# Patient Record
Sex: Female | Born: 1935 | Race: Black or African American | Hispanic: No | Smoking: Never smoker
Health system: Southern US, Community
[De-identification: ages and names within clinical notes are randomized; demographics above are authoritative.]

## PROBLEM LIST (undated history)

## (undated) DIAGNOSIS — G8929 Other chronic pain: Secondary | ICD-10-CM

## (undated) DIAGNOSIS — R413 Other amnesia: Secondary | ICD-10-CM

## (undated) DIAGNOSIS — Z800 Family history of malignant neoplasm of digestive organs: Secondary | ICD-10-CM

## (undated) DIAGNOSIS — M1990 Unspecified osteoarthritis, unspecified site: Secondary | ICD-10-CM

## (undated) DIAGNOSIS — M545 Low back pain, unspecified: Secondary | ICD-10-CM

## (undated) DIAGNOSIS — K2970 Gastritis, unspecified, without bleeding: Secondary | ICD-10-CM

## (undated) DIAGNOSIS — K5730 Diverticulosis of large intestine without perforation or abscess without bleeding: Secondary | ICD-10-CM

## (undated) DIAGNOSIS — I10 Essential (primary) hypertension: Secondary | ICD-10-CM

## (undated) DIAGNOSIS — K219 Gastro-esophageal reflux disease without esophagitis: Secondary | ICD-10-CM

## (undated) DIAGNOSIS — M479 Spondylosis, unspecified: Secondary | ICD-10-CM

## (undated) DIAGNOSIS — I4891 Unspecified atrial fibrillation: Secondary | ICD-10-CM

## (undated) DIAGNOSIS — E119 Type 2 diabetes mellitus without complications: Secondary | ICD-10-CM

## (undated) DIAGNOSIS — D696 Thrombocytopenia, unspecified: Secondary | ICD-10-CM

## (undated) DIAGNOSIS — K449 Diaphragmatic hernia without obstruction or gangrene: Secondary | ICD-10-CM

## (undated) DIAGNOSIS — M542 Cervicalgia: Secondary | ICD-10-CM

## (undated) HISTORY — DX: Essential (primary) hypertension: I10

## (undated) HISTORY — DX: Low back pain, unspecified: M54.50

## (undated) HISTORY — DX: Gastritis, unspecified, without bleeding: K29.70

## (undated) HISTORY — DX: Low back pain: M54.5

## (undated) HISTORY — DX: Other chronic pain: G89.29

## (undated) HISTORY — DX: Cervicalgia: M54.2

## (undated) HISTORY — DX: Diverticulosis of large intestine without perforation or abscess without bleeding: K57.30

## (undated) HISTORY — DX: Spondylosis, unspecified: M47.9

## (undated) HISTORY — DX: Type 2 diabetes mellitus without complications: E11.9

## (undated) HISTORY — PX: TUBAL LIGATION: SHX77

## (undated) HISTORY — DX: Unspecified osteoarthritis, unspecified site: M19.90

## (undated) HISTORY — DX: Other amnesia: R41.3

## (undated) HISTORY — DX: Diaphragmatic hernia without obstruction or gangrene: K44.9

## (undated) HISTORY — DX: Family history of malignant neoplasm of digestive organs: Z80.0

---

## 2000-01-04 ENCOUNTER — Encounter: Payer: Self-pay | Admitting: Obstetrics and Gynecology

## 2000-01-04 ENCOUNTER — Encounter: Admission: RE | Admit: 2000-01-04 | Discharge: 2000-01-04 | Payer: Self-pay | Admitting: Obstetrics and Gynecology

## 2000-04-25 ENCOUNTER — Encounter (INDEPENDENT_AMBULATORY_CARE_PROVIDER_SITE_OTHER): Payer: Self-pay | Admitting: Specialist

## 2000-04-25 ENCOUNTER — Ambulatory Visit (HOSPITAL_COMMUNITY): Admission: RE | Admit: 2000-04-25 | Discharge: 2000-04-25 | Payer: Self-pay | Admitting: Obstetrics and Gynecology

## 2000-06-28 ENCOUNTER — Ambulatory Visit (HOSPITAL_COMMUNITY): Admission: RE | Admit: 2000-06-28 | Discharge: 2000-06-28 | Payer: Self-pay | Admitting: *Deleted

## 2001-01-09 ENCOUNTER — Encounter: Admission: RE | Admit: 2001-01-09 | Discharge: 2001-01-09 | Payer: Self-pay | Admitting: Obstetrics and Gynecology

## 2001-01-09 ENCOUNTER — Encounter: Payer: Self-pay | Admitting: Obstetrics and Gynecology

## 2001-07-31 ENCOUNTER — Ambulatory Visit (HOSPITAL_COMMUNITY): Admission: RE | Admit: 2001-07-31 | Discharge: 2001-07-31 | Payer: Self-pay | Admitting: Gastroenterology

## 2002-01-01 ENCOUNTER — Encounter: Payer: Self-pay | Admitting: Obstetrics and Gynecology

## 2002-01-01 ENCOUNTER — Encounter: Admission: RE | Admit: 2002-01-01 | Discharge: 2002-01-01 | Payer: Self-pay | Admitting: Obstetrics and Gynecology

## 2003-05-07 ENCOUNTER — Ambulatory Visit (HOSPITAL_COMMUNITY): Admission: RE | Admit: 2003-05-07 | Discharge: 2003-05-07 | Payer: Self-pay | Admitting: *Deleted

## 2003-07-08 ENCOUNTER — Encounter: Payer: Self-pay | Admitting: Obstetrics and Gynecology

## 2003-07-08 ENCOUNTER — Encounter: Admission: RE | Admit: 2003-07-08 | Discharge: 2003-07-08 | Payer: Self-pay | Admitting: Obstetrics and Gynecology

## 2003-11-26 ENCOUNTER — Encounter: Payer: Self-pay | Admitting: Internal Medicine

## 2004-07-27 ENCOUNTER — Encounter: Admission: RE | Admit: 2004-07-27 | Discharge: 2004-07-27 | Payer: Self-pay | Admitting: Obstetrics and Gynecology

## 2004-12-07 ENCOUNTER — Ambulatory Visit: Payer: Self-pay | Admitting: Internal Medicine

## 2004-12-14 ENCOUNTER — Ambulatory Visit: Payer: Self-pay | Admitting: Internal Medicine

## 2004-12-14 ENCOUNTER — Ambulatory Visit: Payer: Self-pay

## 2005-04-20 ENCOUNTER — Ambulatory Visit (HOSPITAL_COMMUNITY): Admission: RE | Admit: 2005-04-20 | Discharge: 2005-04-20 | Payer: Self-pay | Admitting: Internal Medicine

## 2005-04-20 ENCOUNTER — Ambulatory Visit: Payer: Self-pay | Admitting: Internal Medicine

## 2005-05-15 ENCOUNTER — Ambulatory Visit (HOSPITAL_COMMUNITY): Admission: RE | Admit: 2005-05-15 | Discharge: 2005-05-15 | Payer: Self-pay | Admitting: Internal Medicine

## 2005-05-22 ENCOUNTER — Ambulatory Visit: Payer: Self-pay | Admitting: Internal Medicine

## 2005-07-17 ENCOUNTER — Ambulatory Visit: Payer: Self-pay | Admitting: Internal Medicine

## 2005-07-26 ENCOUNTER — Ambulatory Visit: Payer: Self-pay | Admitting: Internal Medicine

## 2005-09-07 ENCOUNTER — Ambulatory Visit: Payer: Self-pay | Admitting: Internal Medicine

## 2005-09-21 ENCOUNTER — Ambulatory Visit: Payer: Self-pay | Admitting: Gastroenterology

## 2005-09-27 ENCOUNTER — Ambulatory Visit: Payer: Self-pay | Admitting: Gastroenterology

## 2005-10-04 ENCOUNTER — Encounter: Admission: RE | Admit: 2005-10-04 | Discharge: 2005-10-04 | Payer: Self-pay | Admitting: Internal Medicine

## 2005-10-19 ENCOUNTER — Ambulatory Visit: Payer: Self-pay | Admitting: Gastroenterology

## 2005-10-31 ENCOUNTER — Ambulatory Visit: Payer: Self-pay | Admitting: Gastroenterology

## 2006-01-24 ENCOUNTER — Ambulatory Visit: Payer: Self-pay | Admitting: Internal Medicine

## 2006-02-20 ENCOUNTER — Ambulatory Visit: Payer: Self-pay | Admitting: Internal Medicine

## 2006-03-07 ENCOUNTER — Ambulatory Visit: Payer: Self-pay | Admitting: Internal Medicine

## 2006-03-28 ENCOUNTER — Ambulatory Visit: Payer: Self-pay | Admitting: Internal Medicine

## 2006-05-31 ENCOUNTER — Ambulatory Visit: Payer: Self-pay | Admitting: Internal Medicine

## 2006-06-05 ENCOUNTER — Ambulatory Visit: Payer: Self-pay | Admitting: Internal Medicine

## 2006-10-10 ENCOUNTER — Encounter: Admission: RE | Admit: 2006-10-10 | Discharge: 2006-10-10 | Payer: Self-pay | Admitting: Internal Medicine

## 2006-10-28 ENCOUNTER — Emergency Department (HOSPITAL_COMMUNITY): Admission: EM | Admit: 2006-10-28 | Discharge: 2006-10-28 | Payer: Self-pay | Admitting: Family Medicine

## 2007-01-20 LAB — CONVERTED CEMR LAB: Pap Smear: NORMAL

## 2007-01-23 ENCOUNTER — Ambulatory Visit: Payer: Self-pay | Admitting: Internal Medicine

## 2007-01-31 ENCOUNTER — Ambulatory Visit: Payer: Self-pay | Admitting: Internal Medicine

## 2007-01-31 LAB — CONVERTED CEMR LAB
ALT: 14 units/L (ref 0–40)
AST: 18 units/L (ref 0–37)
Albumin: 3.6 g/dL (ref 3.5–5.2)
Calcium: 9.7 mg/dL (ref 8.4–10.5)
Chloride: 105 meq/L (ref 96–112)
Cholesterol: 147 mg/dL (ref 0–200)
Creatinine, Ser: 0.9 mg/dL (ref 0.4–1.2)
GFR calc non Af Amer: 65 mL/min
HDL: 45.7 mg/dL (ref >39.0)
Hgb A1c MFr Bld: 6.5 % — ABNORMAL HIGH (ref 4.6–6.0)
LDL Cholesterol: 85 mg/dL (ref 0–99)
Sodium: 143 meq/L (ref 135–145)
Total Bilirubin: 0.7 mg/dL (ref 0.3–1.2)
VLDL: 16 mg/dL (ref 0–40)

## 2007-03-01 ENCOUNTER — Emergency Department (HOSPITAL_COMMUNITY): Admission: EM | Admit: 2007-03-01 | Discharge: 2007-03-01 | Payer: Self-pay | Admitting: Emergency Medicine

## 2007-05-02 ENCOUNTER — Ambulatory Visit: Payer: Self-pay | Admitting: Internal Medicine

## 2007-05-08 ENCOUNTER — Ambulatory Visit: Payer: Self-pay | Admitting: Cardiology

## 2007-05-16 ENCOUNTER — Ambulatory Visit: Payer: Self-pay | Admitting: Internal Medicine

## 2007-05-16 LAB — CONVERTED CEMR LAB
Bacteria, UA: NEGATIVE
Basophils Relative: 1 % (ref 0.0–1.0)
Bilirubin Urine: NEGATIVE
Crystals: NEGATIVE
Eosinophils Relative: 7.5 % — ABNORMAL HIGH (ref 0.0–5.0)
HCT: 39.3 % (ref 36.0–46.0)
Hemoglobin: 12.9 g/dL (ref 12.0–15.0)
Lymphocytes Relative: 39.1 % (ref 12.0–46.0)
Monocytes Absolute: 0.5 10*3/uL (ref 0.2–0.7)
Monocytes Relative: 10.5 % (ref 3.0–11.0)
Neutro Abs: 2.1 10*3/uL (ref 1.4–7.7)
Nitrite: NEGATIVE
RDW: 15 % — ABNORMAL HIGH (ref 11.5–14.6)
Sed Rate: 10 mm/hr (ref 0–25)
Specific Gravity, Urine: 1.02 (ref 1.000–1.03)
WBC, UA: NONE SEEN cells/hpf
WBC: 5 10*3/uL (ref 4.5–10.5)

## 2007-06-17 ENCOUNTER — Ambulatory Visit: Payer: Self-pay | Admitting: Internal Medicine

## 2007-06-21 ENCOUNTER — Encounter: Payer: Self-pay | Admitting: Internal Medicine

## 2007-06-21 DIAGNOSIS — I10 Essential (primary) hypertension: Secondary | ICD-10-CM | POA: Insufficient documentation

## 2007-06-21 DIAGNOSIS — M545 Low back pain, unspecified: Secondary | ICD-10-CM | POA: Insufficient documentation

## 2007-06-21 DIAGNOSIS — Z860100 Personal history of colon polyps, unspecified: Secondary | ICD-10-CM | POA: Insufficient documentation

## 2007-06-21 DIAGNOSIS — E119 Type 2 diabetes mellitus without complications: Secondary | ICD-10-CM | POA: Insufficient documentation

## 2007-07-08 ENCOUNTER — Ambulatory Visit: Payer: Self-pay | Admitting: Internal Medicine

## 2007-07-16 ENCOUNTER — Encounter: Admission: RE | Admit: 2007-07-16 | Discharge: 2007-08-08 | Payer: Self-pay | Admitting: Internal Medicine

## 2007-07-27 ENCOUNTER — Emergency Department (HOSPITAL_COMMUNITY): Admission: EM | Admit: 2007-07-27 | Discharge: 2007-07-27 | Payer: Self-pay | Admitting: Emergency Medicine

## 2007-08-19 ENCOUNTER — Ambulatory Visit: Payer: Self-pay | Admitting: Internal Medicine

## 2007-08-24 ENCOUNTER — Encounter: Admission: RE | Admit: 2007-08-24 | Discharge: 2007-08-24 | Payer: Self-pay | Admitting: Internal Medicine

## 2007-09-04 ENCOUNTER — Ambulatory Visit: Payer: Self-pay | Admitting: Internal Medicine

## 2007-10-08 ENCOUNTER — Encounter: Payer: Self-pay | Admitting: Internal Medicine

## 2007-10-20 ENCOUNTER — Encounter: Admission: RE | Admit: 2007-10-20 | Discharge: 2007-11-26 | Payer: Self-pay | Admitting: Neurosurgery

## 2007-11-14 ENCOUNTER — Telehealth: Payer: Self-pay | Admitting: Internal Medicine

## 2007-11-17 ENCOUNTER — Telehealth: Payer: Self-pay | Admitting: Internal Medicine

## 2007-12-17 ENCOUNTER — Encounter: Payer: Self-pay | Admitting: Internal Medicine

## 2008-02-05 ENCOUNTER — Ambulatory Visit (HOSPITAL_COMMUNITY): Admission: RE | Admit: 2008-02-05 | Discharge: 2008-02-05 | Payer: Self-pay | Admitting: Neurosurgery

## 2008-02-27 ENCOUNTER — Ambulatory Visit: Payer: Self-pay | Admitting: Internal Medicine

## 2008-02-27 LAB — CONVERTED CEMR LAB
ALT: 13 units/L (ref 0–35)
Basophils Absolute: 0.1 10*3/uL (ref 0.0–0.1)
Basophils Relative: 1.2 % — ABNORMAL HIGH (ref 0.0–1.0)
CO2: 33 meq/L — ABNORMAL HIGH (ref 19–32)
Calcium: 9.4 mg/dL (ref 8.4–10.5)
Cholesterol: 171 mg/dL (ref 0–200)
Creatinine, Ser: 1.2 mg/dL (ref 0.4–1.2)
Creatinine,U: 481 mg/dL
Eosinophils Absolute: 0.3 10*3/uL (ref 0.0–0.7)
GFR calc Af Amer: 56 mL/min
GFR calc non Af Amer: 46 mL/min
Hemoglobin: 12.6 g/dL (ref 12.0–15.0)
MCHC: 32 g/dL (ref 30.0–36.0)
MCV: 85.3 fL (ref 78.0–100.0)
Microalb Creat Ratio: 8.9 mg/g (ref 0.0–30.0)
Microalb, Ur: 4.3 mg/dL — ABNORMAL HIGH (ref 0.0–1.9)
Monocytes Absolute: 0.7 10*3/uL (ref 0.1–1.0)
Neutro Abs: 2.4 10*3/uL (ref 1.4–7.7)
RBC: 4.6 M/uL (ref 3.87–5.11)
TSH: 1.68 microintl units/mL (ref 0.35–5.50)
Total Bilirubin: 0.7 mg/dL (ref 0.3–1.2)
Triglycerides: 118 mg/dL (ref 0–149)

## 2008-03-03 ENCOUNTER — Telehealth: Payer: Self-pay | Admitting: Internal Medicine

## 2008-03-03 ENCOUNTER — Ambulatory Visit: Payer: Self-pay | Admitting: Internal Medicine

## 2008-03-03 DIAGNOSIS — E041 Nontoxic single thyroid nodule: Secondary | ICD-10-CM | POA: Insufficient documentation

## 2008-03-03 DIAGNOSIS — N63 Unspecified lump in unspecified breast: Secondary | ICD-10-CM | POA: Insufficient documentation

## 2008-03-12 ENCOUNTER — Encounter: Admission: RE | Admit: 2008-03-12 | Discharge: 2008-03-12 | Payer: Self-pay | Admitting: Internal Medicine

## 2008-03-15 ENCOUNTER — Telehealth: Payer: Self-pay | Admitting: Internal Medicine

## 2008-03-16 ENCOUNTER — Encounter: Admission: RE | Admit: 2008-03-16 | Discharge: 2008-03-16 | Payer: Self-pay | Admitting: Internal Medicine

## 2008-03-22 ENCOUNTER — Ambulatory Visit: Payer: Self-pay | Admitting: Internal Medicine

## 2008-03-22 DIAGNOSIS — M542 Cervicalgia: Secondary | ICD-10-CM | POA: Insufficient documentation

## 2008-03-22 DIAGNOSIS — I499 Cardiac arrhythmia, unspecified: Secondary | ICD-10-CM | POA: Insufficient documentation

## 2008-04-13 ENCOUNTER — Ambulatory Visit: Payer: Self-pay | Admitting: Cardiology

## 2008-04-15 ENCOUNTER — Telehealth: Payer: Self-pay | Admitting: Internal Medicine

## 2008-04-23 ENCOUNTER — Telehealth: Payer: Self-pay | Admitting: Internal Medicine

## 2008-04-28 ENCOUNTER — Telehealth (INDEPENDENT_AMBULATORY_CARE_PROVIDER_SITE_OTHER): Payer: Self-pay | Admitting: *Deleted

## 2008-04-28 ENCOUNTER — Ambulatory Visit: Payer: Self-pay | Admitting: Internal Medicine

## 2008-04-28 DIAGNOSIS — I281 Aneurysm of pulmonary artery: Secondary | ICD-10-CM | POA: Insufficient documentation

## 2008-05-12 ENCOUNTER — Encounter (INDEPENDENT_AMBULATORY_CARE_PROVIDER_SITE_OTHER): Payer: Self-pay | Admitting: *Deleted

## 2008-06-16 ENCOUNTER — Encounter
Admission: RE | Admit: 2008-06-16 | Discharge: 2008-07-30 | Payer: Self-pay | Admitting: Physical Medicine and Rehabilitation

## 2008-06-18 ENCOUNTER — Ambulatory Visit: Payer: Self-pay | Admitting: Physical Medicine and Rehabilitation

## 2008-06-18 ENCOUNTER — Encounter: Payer: Self-pay | Admitting: Internal Medicine

## 2008-06-30 ENCOUNTER — Ambulatory Visit: Payer: Self-pay | Admitting: Internal Medicine

## 2008-07-09 ENCOUNTER — Ambulatory Visit: Payer: Self-pay | Admitting: Internal Medicine

## 2008-07-09 DIAGNOSIS — M76899 Other specified enthesopathies of unspecified lower limb, excluding foot: Secondary | ICD-10-CM | POA: Insufficient documentation

## 2008-07-09 LAB — CONVERTED CEMR LAB
GFR calc Af Amer: 69 mL/min
GFR calc non Af Amer: 57 mL/min
Glucose, Bld: 96 mg/dL (ref 70–99)
Potassium: 3.9 meq/L (ref 3.5–5.1)
Sodium: 140 meq/L (ref 135–145)
VLDL: 15 mg/dL (ref 0–40)

## 2008-07-30 ENCOUNTER — Ambulatory Visit: Payer: Self-pay | Admitting: Physical Medicine and Rehabilitation

## 2008-08-16 ENCOUNTER — Telehealth: Payer: Self-pay | Admitting: Internal Medicine

## 2008-09-16 ENCOUNTER — Ambulatory Visit: Payer: Self-pay | Admitting: Internal Medicine

## 2008-09-16 DIAGNOSIS — K921 Melena: Secondary | ICD-10-CM | POA: Insufficient documentation

## 2008-09-17 ENCOUNTER — Telehealth: Payer: Self-pay | Admitting: Internal Medicine

## 2008-09-22 ENCOUNTER — Encounter
Admission: RE | Admit: 2008-09-22 | Discharge: 2008-09-27 | Payer: Self-pay | Admitting: Physical Medicine and Rehabilitation

## 2008-09-27 ENCOUNTER — Ambulatory Visit: Payer: Self-pay | Admitting: Physical Medicine and Rehabilitation

## 2008-10-06 ENCOUNTER — Telehealth: Payer: Self-pay | Admitting: Internal Medicine

## 2008-10-22 ENCOUNTER — Telehealth: Payer: Self-pay | Admitting: Internal Medicine

## 2008-11-26 HISTORY — PX: COLONOSCOPY: SHX174

## 2008-12-23 ENCOUNTER — Encounter
Admission: RE | Admit: 2008-12-23 | Discharge: 2009-03-23 | Payer: Self-pay | Admitting: Physical Medicine and Rehabilitation

## 2008-12-24 ENCOUNTER — Ambulatory Visit: Payer: Self-pay | Admitting: Physical Medicine and Rehabilitation

## 2009-01-29 ENCOUNTER — Encounter: Payer: Self-pay | Admitting: Internal Medicine

## 2009-01-29 ENCOUNTER — Ambulatory Visit: Payer: Self-pay | Admitting: Internal Medicine

## 2009-01-29 DIAGNOSIS — R634 Abnormal weight loss: Secondary | ICD-10-CM | POA: Insufficient documentation

## 2009-02-10 ENCOUNTER — Ambulatory Visit: Payer: Self-pay | Admitting: Physical Medicine and Rehabilitation

## 2009-03-04 ENCOUNTER — Ambulatory Visit: Payer: Self-pay | Admitting: Internal Medicine

## 2009-03-04 LAB — CONVERTED CEMR LAB
Basophils Absolute: 0 10*3/uL (ref 0.0–0.1)
Chloride: 105 meq/L (ref 96–112)
Eosinophils Absolute: 0.4 10*3/uL (ref 0.0–0.7)
Hgb A1c MFr Bld: 6.1 % (ref 4.6–6.5)
Lymphocytes Relative: 39 % (ref 12.0–46.0)
MCHC: 33.9 g/dL (ref 30.0–36.0)
Neutrophils Relative %: 45.3 % (ref 43.0–77.0)
Potassium: 3.9 meq/L (ref 3.5–5.1)
RBC: 4.31 M/uL (ref 3.87–5.11)
RDW: 14.7 % — ABNORMAL HIGH (ref 11.5–14.6)
Sodium: 143 meq/L (ref 135–145)
TSH: 1.97 microintl units/mL (ref 0.35–5.50)
Vitamin B-12: 308 pg/mL (ref 211–911)

## 2009-03-20 ENCOUNTER — Encounter: Payer: Self-pay | Admitting: Internal Medicine

## 2009-03-25 ENCOUNTER — Telehealth: Payer: Self-pay | Admitting: Internal Medicine

## 2009-03-25 ENCOUNTER — Encounter: Admission: RE | Admit: 2009-03-25 | Discharge: 2009-03-25 | Payer: Self-pay | Admitting: Internal Medicine

## 2009-04-05 ENCOUNTER — Encounter
Admission: RE | Admit: 2009-04-05 | Discharge: 2009-04-07 | Payer: Self-pay | Admitting: Physical Medicine and Rehabilitation

## 2009-04-07 ENCOUNTER — Ambulatory Visit: Payer: Self-pay | Admitting: Physical Medicine and Rehabilitation

## 2009-06-07 ENCOUNTER — Ambulatory Visit: Payer: Self-pay | Admitting: Internal Medicine

## 2009-06-07 DIAGNOSIS — K5289 Other specified noninfective gastroenteritis and colitis: Secondary | ICD-10-CM | POA: Insufficient documentation

## 2009-06-07 LAB — CONVERTED CEMR LAB
Basophils Relative: 4.2 % — ABNORMAL HIGH (ref 0.0–3.0)
CO2: 32 meq/L (ref 19–32)
Chloride: 105 meq/L (ref 96–112)
Eosinophils Absolute: 0.2 10*3/uL (ref 0.0–0.7)
Eosinophils Relative: 4.1 % (ref 0.0–5.0)
Hemoglobin: 13.6 g/dL (ref 12.0–15.0)
Lymphocytes Relative: 37.5 % (ref 12.0–46.0)
MCHC: 33.4 g/dL (ref 30.0–36.0)
MCV: 84.6 fL (ref 78.0–100.0)
Neutro Abs: 2 10*3/uL (ref 1.4–7.7)
RBC: 4.81 M/uL (ref 3.87–5.11)
Sodium: 142 meq/L (ref 135–145)

## 2009-06-08 ENCOUNTER — Telehealth: Payer: Self-pay | Admitting: Internal Medicine

## 2009-06-09 ENCOUNTER — Telehealth: Payer: Self-pay | Admitting: Internal Medicine

## 2009-06-09 ENCOUNTER — Ambulatory Visit: Payer: Self-pay | Admitting: Internal Medicine

## 2009-06-10 ENCOUNTER — Ambulatory Visit: Payer: Self-pay | Admitting: Cardiology

## 2009-06-10 ENCOUNTER — Ambulatory Visit: Payer: Self-pay | Admitting: Internal Medicine

## 2009-06-10 ENCOUNTER — Telehealth: Payer: Self-pay | Admitting: Internal Medicine

## 2009-06-10 DIAGNOSIS — R109 Unspecified abdominal pain: Secondary | ICD-10-CM | POA: Insufficient documentation

## 2009-06-10 LAB — CONVERTED CEMR LAB
Albumin: 4.2 g/dL (ref 3.5–5.2)
Amylase: 180 units/L — ABNORMAL HIGH (ref 27–131)
BUN: 13 mg/dL (ref 6–23)
Basophils Absolute: 0 10*3/uL (ref 0.0–0.1)
CO2: 31 meq/L (ref 19–32)
Chloride: 108 meq/L (ref 96–112)
Eosinophils Absolute: 0.1 10*3/uL (ref 0.0–0.7)
GFR calc non Af Amer: 68.93 mL/min (ref >60)
Glucose, Bld: 118 mg/dL — ABNORMAL HIGH (ref 70–99)
HCT: 43.5 % (ref 36.0–46.0)
Hemoglobin: 14.2 g/dL (ref 12.0–15.0)
Ketones, urine, test strip: NEGATIVE
Lipase: 33 units/L (ref 11.0–59.0)
Lymphs Abs: 1.8 10*3/uL (ref 0.7–4.0)
MCHC: 32.7 g/dL (ref 30.0–36.0)
MCV: 85.7 fL (ref 78.0–100.0)
Monocytes Absolute: 0.5 10*3/uL (ref 0.1–1.0)
Neutro Abs: 2.5 10*3/uL (ref 1.4–7.7)
Nitrite: NEGATIVE
Platelets: 193 10*3/uL (ref 150.0–400.0)
Potassium: 4.5 meq/L (ref 3.5–5.1)
RDW: 13.4 % (ref 11.5–14.6)
Total Bilirubin: 0.9 mg/dL (ref 0.3–1.2)
Urobilinogen, UA: 0.2

## 2009-06-11 ENCOUNTER — Emergency Department (HOSPITAL_COMMUNITY): Admission: EM | Admit: 2009-06-11 | Discharge: 2009-06-11 | Payer: Self-pay | Admitting: Emergency Medicine

## 2009-06-22 ENCOUNTER — Ambulatory Visit: Payer: Self-pay | Admitting: Physical Medicine and Rehabilitation

## 2009-06-22 ENCOUNTER — Encounter
Admission: RE | Admit: 2009-06-22 | Discharge: 2009-09-20 | Payer: Self-pay | Admitting: Physical Medicine and Rehabilitation

## 2009-07-04 ENCOUNTER — Telehealth: Payer: Self-pay | Admitting: Internal Medicine

## 2009-07-14 ENCOUNTER — Ambulatory Visit: Payer: Self-pay | Admitting: Internal Medicine

## 2009-07-19 ENCOUNTER — Telehealth (INDEPENDENT_AMBULATORY_CARE_PROVIDER_SITE_OTHER): Payer: Self-pay | Admitting: *Deleted

## 2009-07-20 ENCOUNTER — Emergency Department (HOSPITAL_COMMUNITY): Admission: EM | Admit: 2009-07-20 | Discharge: 2009-07-20 | Payer: Self-pay | Admitting: Emergency Medicine

## 2009-07-28 ENCOUNTER — Ambulatory Visit: Payer: Self-pay | Admitting: Internal Medicine

## 2009-07-28 DIAGNOSIS — R413 Other amnesia: Secondary | ICD-10-CM | POA: Insufficient documentation

## 2009-07-30 ENCOUNTER — Telehealth: Payer: Self-pay | Admitting: Family Medicine

## 2009-07-30 ENCOUNTER — Emergency Department (HOSPITAL_COMMUNITY): Admission: EM | Admit: 2009-07-30 | Discharge: 2009-07-30 | Payer: Self-pay | Admitting: Emergency Medicine

## 2009-08-03 ENCOUNTER — Telehealth: Payer: Self-pay | Admitting: Internal Medicine

## 2009-08-04 ENCOUNTER — Telehealth (INDEPENDENT_AMBULATORY_CARE_PROVIDER_SITE_OTHER): Payer: Self-pay | Admitting: *Deleted

## 2009-08-05 ENCOUNTER — Telehealth: Payer: Self-pay | Admitting: Internal Medicine

## 2009-08-05 ENCOUNTER — Ambulatory Visit: Payer: Self-pay | Admitting: Internal Medicine

## 2009-08-08 ENCOUNTER — Ambulatory Visit: Payer: Self-pay | Admitting: Gastroenterology

## 2009-08-08 DIAGNOSIS — K5730 Diverticulosis of large intestine without perforation or abscess without bleeding: Secondary | ICD-10-CM | POA: Insufficient documentation

## 2009-08-08 DIAGNOSIS — R110 Nausea: Secondary | ICD-10-CM | POA: Insufficient documentation

## 2009-08-08 LAB — CONVERTED CEMR LAB
Bilirubin Urine: NEGATIVE
Hemoglobin, Urine: NEGATIVE
Urine Glucose: 100 mg/dL

## 2009-08-12 ENCOUNTER — Emergency Department (HOSPITAL_COMMUNITY): Admission: EM | Admit: 2009-08-12 | Discharge: 2009-08-12 | Payer: Self-pay | Admitting: Emergency Medicine

## 2009-08-12 ENCOUNTER — Telehealth: Payer: Self-pay | Admitting: Nurse Practitioner

## 2009-08-18 ENCOUNTER — Telehealth: Payer: Self-pay | Admitting: Gastroenterology

## 2009-08-25 ENCOUNTER — Ambulatory Visit: Payer: Self-pay | Admitting: Gastroenterology

## 2009-08-25 LAB — CONVERTED CEMR LAB
AST: 16 units/L (ref 0–37)
Albumin: 4.1 g/dL (ref 3.5–5.2)
BUN: 17 mg/dL (ref 6–23)
Basophils Relative: 2.8 % (ref 0.0–3.0)
Calcium: 9.3 mg/dL (ref 8.4–10.5)
Eosinophils Relative: 4.7 % (ref 0.0–5.0)
GFR calc non Af Amer: 68.89 mL/min (ref >60)
Glucose, Bld: 104 mg/dL — ABNORMAL HIGH (ref 70–99)
HCT: 41.2 % (ref 36.0–46.0)
Hemoglobin: 13.4 g/dL (ref 12.0–15.0)
Lymphs Abs: 1.8 10*3/uL (ref 0.7–4.0)
MCV: 86.3 fL (ref 78.0–100.0)
Monocytes Absolute: 0.4 10*3/uL (ref 0.1–1.0)
Neutro Abs: 2.1 10*3/uL (ref 1.4–7.7)
Potassium: 3.6 meq/L (ref 3.5–5.1)
RBC: 4.78 M/uL (ref 3.87–5.11)
TSH: 1.68 microintl units/mL (ref 0.35–5.50)
WBC: 4.6 10*3/uL (ref 4.5–10.5)

## 2009-08-31 ENCOUNTER — Telehealth: Payer: Self-pay | Admitting: Gastroenterology

## 2009-09-09 ENCOUNTER — Telehealth: Payer: Self-pay | Admitting: Gastroenterology

## 2009-09-09 ENCOUNTER — Ambulatory Visit: Payer: Self-pay | Admitting: Gastroenterology

## 2009-09-12 ENCOUNTER — Ambulatory Visit: Payer: Self-pay | Admitting: Gastroenterology

## 2009-09-12 ENCOUNTER — Encounter: Payer: Self-pay | Admitting: Internal Medicine

## 2009-09-12 ENCOUNTER — Encounter: Payer: Self-pay | Admitting: Gastroenterology

## 2009-09-12 LAB — HM COLONOSCOPY

## 2009-09-13 ENCOUNTER — Encounter: Payer: Self-pay | Admitting: Gastroenterology

## 2009-11-17 ENCOUNTER — Telehealth: Payer: Self-pay | Admitting: Internal Medicine

## 2009-12-24 ENCOUNTER — Emergency Department (HOSPITAL_COMMUNITY): Admission: EM | Admit: 2009-12-24 | Discharge: 2009-12-24 | Payer: Self-pay | Admitting: Emergency Medicine

## 2009-12-26 ENCOUNTER — Telehealth: Payer: Self-pay | Admitting: Internal Medicine

## 2009-12-26 ENCOUNTER — Ambulatory Visit: Payer: Self-pay | Admitting: Internal Medicine

## 2009-12-28 ENCOUNTER — Encounter: Payer: Self-pay | Admitting: Internal Medicine

## 2009-12-29 ENCOUNTER — Encounter: Payer: Self-pay | Admitting: Internal Medicine

## 2009-12-29 ENCOUNTER — Ambulatory Visit: Payer: Self-pay

## 2010-01-16 ENCOUNTER — Telehealth: Payer: Self-pay | Admitting: Internal Medicine

## 2010-02-24 ENCOUNTER — Ambulatory Visit: Payer: Self-pay | Admitting: Internal Medicine

## 2010-03-02 ENCOUNTER — Ambulatory Visit: Payer: Self-pay | Admitting: Gastroenterology

## 2010-03-06 DIAGNOSIS — E538 Deficiency of other specified B group vitamins: Secondary | ICD-10-CM | POA: Insufficient documentation

## 2010-03-06 LAB — CONVERTED CEMR LAB
ALT: 12 units/L (ref 0–35)
AST: 15 units/L (ref 0–37)
Alkaline Phosphatase: 68 units/L (ref 39–117)
Amylase: 206 units/L — ABNORMAL HIGH (ref 27–131)
BUN: 12 mg/dL (ref 6–23)
Basophils Absolute: 0 10*3/uL (ref 0.0–0.1)
Basophils Relative: 0.4 % (ref 0.0–3.0)
Bilirubin, Direct: 0.1 mg/dL (ref 0.0–0.3)
Chloride: 104 meq/L (ref 96–112)
Eosinophils Absolute: 0.2 10*3/uL (ref 0.0–0.7)
Iron: 102 ug/dL (ref 42–145)
Lipase: 42 units/L (ref 11.0–59.0)
Lymphocytes Relative: 43.1 % (ref 12.0–46.0)
MCHC: 33.1 g/dL (ref 30.0–36.0)
Neutrophils Relative %: 38.3 % — ABNORMAL LOW (ref 43.0–77.0)
Potassium: 3.9 meq/L (ref 3.5–5.1)
RBC: 4.89 M/uL (ref 3.87–5.11)
Saturation Ratios: 35.9 % (ref 20.0–50.0)
Sodium: 143 meq/L (ref 135–145)
Total Bilirubin: 0.4 mg/dL (ref 0.3–1.2)
Transferrin: 203.1 mg/dL — ABNORMAL LOW (ref 212.0–360.0)

## 2010-03-07 ENCOUNTER — Ambulatory Visit: Payer: Self-pay | Admitting: Gastroenterology

## 2010-03-08 ENCOUNTER — Telehealth: Payer: Self-pay | Admitting: Internal Medicine

## 2010-03-09 ENCOUNTER — Telehealth: Payer: Self-pay | Admitting: Internal Medicine

## 2010-03-16 ENCOUNTER — Ambulatory Visit: Payer: Self-pay | Admitting: Gastroenterology

## 2010-03-23 ENCOUNTER — Ambulatory Visit: Payer: Self-pay | Admitting: Gastroenterology

## 2010-04-11 ENCOUNTER — Telehealth: Payer: Self-pay | Admitting: Gastroenterology

## 2010-04-20 ENCOUNTER — Ambulatory Visit: Payer: Self-pay | Admitting: Gastroenterology

## 2010-04-27 ENCOUNTER — Telehealth: Payer: Self-pay | Admitting: Internal Medicine

## 2010-04-27 ENCOUNTER — Encounter: Admission: RE | Admit: 2010-04-27 | Discharge: 2010-04-27 | Payer: Self-pay | Admitting: Internal Medicine

## 2010-04-27 LAB — HM MAMMOGRAPHY: HM Mammogram: NEGATIVE

## 2010-05-25 ENCOUNTER — Ambulatory Visit: Payer: Self-pay | Admitting: Gastroenterology

## 2010-06-22 ENCOUNTER — Ambulatory Visit: Payer: Self-pay | Admitting: Gastroenterology

## 2010-07-26 ENCOUNTER — Ambulatory Visit: Payer: Self-pay | Admitting: Gastroenterology

## 2010-08-24 ENCOUNTER — Ambulatory Visit: Payer: Self-pay | Admitting: Gastroenterology

## 2010-09-03 ENCOUNTER — Emergency Department (HOSPITAL_COMMUNITY): Admission: EM | Admit: 2010-09-03 | Discharge: 2010-09-03 | Payer: Self-pay | Admitting: Emergency Medicine

## 2010-09-06 ENCOUNTER — Telehealth: Payer: Self-pay | Admitting: Internal Medicine

## 2010-09-07 ENCOUNTER — Ambulatory Visit: Payer: Self-pay | Admitting: Internal Medicine

## 2010-09-07 ENCOUNTER — Encounter: Payer: Self-pay | Admitting: Internal Medicine

## 2010-09-14 ENCOUNTER — Ambulatory Visit: Payer: Self-pay | Admitting: Internal Medicine

## 2010-09-21 ENCOUNTER — Ambulatory Visit: Payer: Self-pay | Admitting: Internal Medicine

## 2010-09-23 ENCOUNTER — Encounter: Payer: Self-pay | Admitting: Internal Medicine

## 2010-12-08 ENCOUNTER — Telehealth: Payer: Self-pay | Admitting: Internal Medicine

## 2010-12-17 ENCOUNTER — Encounter: Payer: Self-pay | Admitting: Internal Medicine

## 2010-12-26 NOTE — Assessment & Plan Note (Signed)
Summary: MONTHLY B12//266.2//SP  Nurse Visit   Allergies: 1)  ! Asa  Medication Administration  Injection # 1:    Medication: Vit B12 1000 mcg    Diagnosis: VITAMIN B12 DEFICIENCY (ICD-266.2)    Route: IM    Site: L deltoid    Exp Date: 05/2012    Lot #: [id number]    Mfr: [employer]    Patient tolerated injection without complications    Given by: Milford Cage NCMA (August 24, 2010 11:16 AM)  Orders Added: 1)  Vit B12 1000 mcg [J3420]

## 2010-12-26 NOTE — Assessment & Plan Note (Signed)
Summary: #2 of 3 weely inj/266.2/ dfs  Nurse Visit   Allergies: 1)  ! Asa  Medication Administration  Injection # 1:    Medication: Vit B12 1000 mcg    Diagnosis: VITAMIN B12 DEFICIENCY (ICD-266.2)    Route: IM    Site: R deltoid    Exp Date: 12/2011    Lot #: [id number]    Mfr: [employer]    Comments: pt to schedule next weekly b12 3 of 3    Patient tolerated injection without complications    Given by: Chales Abrahams CMA Duncan Dull) (March 16, 2010 10:54 AM)  Orders Added: 1)  Vit B12 1000 mcg [J3420]

## 2010-12-26 NOTE — Assessment & Plan Note (Signed)
Summary: #1 of 3 weekly inj/266.2/dfs  Nurse Visit  Medication Administration  Injection # 1:    Medication: Vit B12 1000 mcg    Diagnosis: VITAMIN B12 DEFICIENCY (ICD-266.2)    Route: IM    Site: R deltoid    Exp Date: 12/2011    Lot #: [id number]    Mfr: [employer]    Comments: pt will return on 4/21 for next injection    Patient tolerated injection without complications    Given by: Francee Piccolo CMA Duncan Dull) (March 07, 2010 10:39 AM)  Orders Added: 1)  Vit B12 1000 mcg [J3420]

## 2010-12-26 NOTE — Assessment & Plan Note (Signed)
Summary: h/a / SD   Vital Signs:  Patient profile:   77 year old female Height:      65 inches Weight:      145 pounds BMI:     24.22 O2 Sat:      99 % on Room air Temp:     98.0 degrees F oral Pulse rate:   56 / minute BP sitting:   142 / 80  (left arm) Cuff size:   regular  Vitals Entered By: Bill Salinas CMA (December 26, 2009 3:56 PM)  O2 Flow:  Room air CC: pt was seen in the ER for severe headache with collapsing episode. Pt's daughter states after passing out it took about 10 hours for pt to come back to normal state/ ab   Primary Care Provider:  Illene Regulus, MD  CC:  pt was seen in the ER for severe headache with collapsing episode. Pt's daughter states after passing out it took about 10 hours for pt to come back to normal state/ ab.  History of Present Illness: Patient presents after two falls early saturday morning followed by 10 hours of being "out of it"  not herself. Shje also had a very bad headache.  She was seen in the ED. Reviewed records: she had negative CT head, normal cxr, normal labs. she is reported to have had right eye lid lag and right sided weakness along with inability to answer questions and was noted to be restless. the plan was to admit for TIA evaluation but then she was sent home by Dr. Conley Rolls who felt her sym;ptoms were from hyoscamine and meloxicam. She has been doing better and her symptoms have abated except for on-going headache.  She does reports that she had a slight headache last night but is now fine.   Current Medications (verified): 1)  Metformin Hcl 500 Mg  Tabs (Metformin Hcl) .... Take 1 Tablet By Mouth Two Times A Day 2)  Amlodipine Besylate 5 Mg Tabs (Amlodipine Besylate) .... Take 1 Tablet Once A Day 3)  First-Bxn Mouthwash  Susp (Diphenhyd-Lidocaine-Nystatin) .... 5 Cc Qid Swish and Swallow 4)  Alprazolam 0.5 Mg Tabs (Alprazolam) .[name].. 1 By Mouth Q6 As Needed Anxiety  Allergies (verified): 1)  ! Jonne Ply  Past History:  Past Medical  History: Last updated: 08/08/2009 Colonic polyps, hx of Diabetes mellitus, type II Hypertension Low back pain Peripheral vascular disease Chronic neck pain - spondylosis Diverticulosis Arthritis  Past Surgical History: Last updated: 06/21/2007 Tubal ligation  Family History: Last updated: 08/25/2009 Father who had a cerebrovascular accident at age 82.  A sister has a history of ovarian cancer.  One brother has problems with his liver, another with throat cancer, and one brother is deceased secondary to complications of emphysema. 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   Review of Systems  The patient denies anorexia, fever, weight loss, weight gain, vision loss, chest pain, dyspnea on exertion, peripheral edema, abdominal pain, genital sores, muscle weakness, transient blindness, depression, unusual weight change, and enlarged lymph nodes.    Physical Exam  General:  WNWD AA female cooperative with exam Head:  Normocephalic and atraumatic without obvious abnormalities. No apparent alopecia or balding. Eyes:  No corneal or conjunctival inflammation noted. EOMI. Perrla. Funduscopic exam benign, without hemorrhages, exudates or papilledema. Vision grossly normal. Ears:  R ear normal and L ear normal.   Neck:  No deformities, masses, or tenderness noted. Lungs:  Normal respiratory effort, chest expands symmetrically. Lungs are clear to auscultation, no crackles or wheezes. Heart:  Normal rate and regular rhythm. S1 and S2 normal without gallop, murmur, click, rub or other  extra sounds. Abdomen:  soft and non-tender.   Msk:  normal ROM, no joint tenderness, no joint swelling, and no joint warmth.   Pulses:  2+ radial pulse Extremities:  No clubbing, cyanosis, edema, or deformity noted with normal full range of motion of all joints.   Neurologic:  alert & oriented X3, cranial nerves II-XII intact, strength normal in all extremities, gait normal, DTRs symmetrical and normal, finger-to-nose normal, and heel-to-shin normal.   Skin:  turgor normal and color normal.   Cervical Nodes:  no anterior cervical adenopathy and no posterior cervical adenopathy.   Psych:  Oriented X3, memory intact for recent and remote, normally interactive, and good eye contact.     Impression & Recommendations:  Problem # 1:  CVA (ICD-434.91) Patient with what sounds like a limited CVA vs seizure with rapid resolution of her symptoms. ER note indicates that she was for admission to complete w/u. Per family report Dr. Conley Rolls felt her symptoms were due to Levsin, taken prn  and meloxicam. To my knowledge neither of these drugs would cause focal weakness. It was Dr. Irwin Brakeman decision to send her home. Her exam today is normal  Plan - complete w/u for CVA vs Seizure: MRI brain, EEG, Carotid dopplers           Medical treatment/ prophylaxis with ASA 325 mg daily.  Orders: Cardiology Referral (Cardiology) Neurology Referral (Neuro) Prescription Created Electronically [id number])  Complete Medication List: 1)  Metformin Hcl 500 Mg Tabs (Metformin hcl) .... Take 1 tablet by mouth two times a day 2)  Amlodipine Besylate 5 Mg Tabs (Amlodipine besylate) .... Take 1 tablet once a day 3)  First-bxn Mouthwash Susp (Diphenhyd-lidocaine-nystatin) .... 5 cc qid swish and swallow 4)  Alprazolam 0.5 Mg Tabs (Alprazolam) .[name].. 1 by mouth q6 as needed anxiety 5)  Famotidine 20 Mg Tabs (Famotidine) .[name].. 1 by mouth two times a day 6)  Lisinopril-hydrochlorothiazide 20-25 Mg Tabs (Lisinopril-hydrochlorothiazide) .[name].. 1  by mouth once daily  Other Orders: Radiology Referral (Radiology)  Patient Instructions: 1)  Possible mini-stroke vs seizure. Plan - take enteric coated aspirin 325mg  once a day. Take famotadine 20mg  twice a day to protect your stomach. Will set up a series of tests to rule out stroke or seizure: MRI brain, carotid dopplers to rule out blockages in the neck and an EEG to rule out seizure activity. Continue all your other medications as listed below.  Prescriptions: FAMOTIDINE 20 MG TABS (FAMOTIDINE) 1 by mouth two times a day  #60 x 12   Entered and Authorized by:   Jacques Navy MD   Signed by:   Jacques Navy MD on 12/27/2009   Method used:   Electronically to        [facility] [phone]* (retail)       [address]       [place], [place]  [address]       Ph: [phone]       Fax:  [phone]   RxID:   [id number]

## 2010-12-26 NOTE — Assessment & Plan Note (Signed)
Summary: BP CK/MEN /NWS      Allergies Added:  Nurse Visit   Vital Signs:  Patient profile:   77 year old female Pulse rate:   62 / minute Pulse rhythm:   regular BP sitting:   132 / 80  (left arm) Cuff size:   regular  Vitals Entered By: Lamar Sprinkles, CMA (September 21, 2010 11:14 AM) CC: BP RECHECK Comments MD Aware, continue current meds. Pt informed. Pt also needs TB for [employer] form. She will return Saturday for TB reading   Current Medications (verified): 1)  Metformin Hcl 500 Mg  Tabs (Metformin Hcl) .... Take 1 Tablet By Mouth Two Times A Day 2)  First-Bxn Mouthwash  Susp (Diphenhyd-Lidocaine-Nystatin) .... 5 Cc Qid Swish and Swallow 3)  Alprazolam 0.5 Mg Tabs (Alprazolam) .[name].. 1 By Mouth Q6 As Needed Anxiety 4)  Famotidine 20 Mg Tabs (Famotidine) .[name].. 1 By Mouth Two Times A Day 5)  Lisinopril-Hydrochlorothiazide 20-25 Mg Tabs (Lisinopril-Hydrochlorothiazide) .[name].. 1 Once Daily 6)  Levsin/sl 0.125 Mg Subl (Hyoscyamine Sulfate) .[name].. 1 Q 4-6 Hrs As Needed 7)  Cyanocobalamin 1000 Mcg/ml Inj Soln (Cyanocobalamin) .[name].. 1 Cc Im Weekly X 3 Then Monthly 8)  Amoxicillin 500 Mg Caps (Amoxicillin) .[name].. 1 Capsule Three Times A Day X 10 Days  Allergies (verified): 1)  ! Asa  Immunizations Administered:  PPD Skin Test:    Vaccine Type: PPD    Site: left forearm    Dose: 0.1 ml    Route: ID    Given by: Lamar Sprinkles, CMA    Exp. Date: 04/22/2012    Lot #: [id number]  PPD Results    Date of reading: 09/23/2010    Results: < 5mm    Interpretation: negative  Orders Added: 1)  TB Skin Test [86580] 2)  Admin 1st Vaccine [90471] 3)  Est. Patient Level I [54270] Prescriptions: LISINOPRIL-HYDROCHLOROTHIAZIDE 20-25 MG TABS (LISINOPRIL-HYDROCHLOROTHIAZIDE) 1 once daily  #90 x 2   Entered by:   Lamar Sprinkles, CMA   Authorized by:   Jacques Navy MD   Signed by:   Lamar Sprinkles, CMA on 09/21/2010   Method used:   Electronically to        [facility]  [phone]* (retail)       [address]       [place], [place]  [address]       Ph: [phone]       Fax: [phone]   RxID:   [id number]

## 2010-12-26 NOTE — Letter (Signed)
Summary: [employer]   Imported By: Lester Newark 09/27/2010 10:28:11  _____________________________________________________________________  External Attachment:    Type:   Image     Comment:   External Document

## 2010-12-26 NOTE — Progress Notes (Signed)
Summary: BP med  Phone Note Call from Patient Call back at Home Phone [phone]   Caller: Patient Summary of Call: pt called stating thst she is out of her BP meds. Pt says that MEN gave samples fo Benicar HCT last OV but no RX. No longer has cough associated with Lisinopril.  pt is requesting RX of sample med to pharmacy on file. Initial call taken by: Margaret Pyle, CMA,  March 08, 2010 2:00 PM  Follow-up for Phone Call        Rx sent for losartan/hct 50/12.5 a generic product Follow-up by: Jacques Navy MD,  March 08, 2010 2:52 PM  Additional Follow-up for Phone Call Additional follow up Details #1::        patient notified. Additional Follow-up by: Lucious Groves,  March 08, 2010 3:06 PM    New/Updated Medications: LOSARTAN POTASSIUM-HCTZ 50-12.5 MG TABS (LOSARTAN POTASSIUM-HCTZ) 1 by mouth once daily Prescriptions: LOSARTAN POTASSIUM-HCTZ 50-12.5 MG TABS (LOSARTAN POTASSIUM-HCTZ) 1 by mouth once daily  #30 x 12   Entered and Authorized by:   Jacques Navy MD   Signed by:   Jacques Navy MD on 03/08/2010   Method used:   Electronically to        [facility] [phone]* (retail)       [address]       [place], [place]  [address]       Ph: [phone]       Fax: [phone]   RxID:   [id number]

## 2010-12-26 NOTE — Assessment & Plan Note (Signed)
Summary: MONTHLY B12 SHOT.AM  Nurse Visit   Medication Administration  Injection # 1:    Medication: Vit B12 1000 mcg    Diagnosis: VITAMIN B12 DEFICIENCY (ICD-266.2)    Route: IM    Site: R deltoid    Exp Date: 04/2012    Lot #: [id number]    Mfr: [employer]    Comments: Pt will return on 08/24/10 for next injection.    Patient tolerated injection without complications    Given by: Francee Piccolo CMA Duncan Dull) (July 26, 2010 11:13 AM)  Orders Added: 1)  Vit B12 1000 mcg [J3420]

## 2010-12-26 NOTE — Progress Notes (Signed)
Summary: f/u?  Phone Note Call from Patient Call back at [facility] Phone [phone]   Summary of Call: Patient was seen by UC this weekend and told not to take her BP med b/c of decreased pulse rate. Would you like to see pt in the office for f/u after UC?  Initial call taken by: Lamar Sprinkles, CMA,  September 06, 2010 1:57 PM  Follow-up for Phone Call        needs ov tomorrow Follow-up by: Jacques Navy MD,  September 06, 2010 2:23 PM  Additional Follow-up for Phone Call Additional follow up Details #1::        Scheduled for office visit tomorrow.  Additional Follow-up by: Lamar Sprinkles, CMA,  September 06, 2010 4:39 PM

## 2010-12-26 NOTE — Assessment & Plan Note (Signed)
Summary: B12 SHOT  Nurse Visit   Allergies: 1)  ! Asa  Medication Administration  Injection # 1:    Medication: Vit B12 1000 mcg    Diagnosis: VITAMIN B12 DEFICIENCY (ICD-266.2)    Route: IM    Site: L deltoid    Exp Date: 12/2011    Lot #: [id number]    Mfr: [employer]    Comments: pt to schedule next monthly b12 at front desk     Patient tolerated injection without complications    Given by: Chales Abrahams CMA (AAMA) (May 25, 2010 11:07 AM)  Orders Added: 1)  Vit B12 1000 mcg [J3420]

## 2010-12-26 NOTE — Assessment & Plan Note (Signed)
Summary: MONTHLY B12 SHOT...LSW.  Nurse Visit   Allergies: 1)  ! Asa  Medication Administration  Injection # 1:    Medication: Vit B12 1000 mcg    Diagnosis: VITAMIN B12 DEFICIENCY (ICD-266.2)    Route: IM    Site: R deltoid    Exp Date: 04/13    Lot #: [id number]    Mfr: [employer]    Patient tolerated injection without complications    Given by: Lamona Curl CMA (AAMA) (Apr 20, 2010 12:01 PM)  Orders Added: 1)  Vit B12 1000 mcg [J3420]   Medication Administration  Injection # 1:    Medication: Vit B12 1000 mcg    Diagnosis: VITAMIN B12 DEFICIENCY (ICD-266.2)    Route: IM    Site: R deltoid    Exp Date: 04/13    Lot #: [id number]    Mfr: [employer]    Patient tolerated injection without complications    Given by: Lamona Curl CMA (AAMA) (Apr 20, 2010 12:01 PM)  Orders Added: 1)  Vit B12 1000 mcg [J3420]

## 2010-12-26 NOTE — Assessment & Plan Note (Signed)
Summary: #3 of 3 weekly inj/266.2/dfs  Nurse Visit   Allergies: 1)  ! Asa  Medication Administration  Injection # 1:    Medication: Vit B12 1000 mcg    Diagnosis: VITAMIN B12 DEFICIENCY (ICD-266.2)    Route: IM    Site: L deltoid    Exp Date: 12/12    Lot #: [id number]    Mfr: [employer]ent    Patient tolerated injection without complications    Given by: Lamona Curl CMA (AAMA) (March 23, 2010 11:07 AM)  Orders Added: 1)  Vit B12 1000 mcg [J3420]

## 2010-12-26 NOTE — Miscellaneous (Signed)
Summary: Orders Update  Clinical Lists Changes  Orders: Added new Test order of Carotid Duplex (Carotid Duplex) - Signed 

## 2010-12-26 NOTE — Progress Notes (Signed)
  Phone Note Outgoing Call   Summary of Call: carotid doppler from Feb 3 was normal - no blockage.  Tanks for calling her Initial call taken by: Jacques Navy MD,  January 16, 2010 8:49 AM  Follow-up for Phone Call        informed pt of results Follow-up by: Ami Bullins CMA,  January 16, 2010 10:49 AM

## 2010-12-26 NOTE — Assessment & Plan Note (Signed)
Summary: blood pressure/check heart-rate/cd-coming at 9am      Allergies Added:  Nurse Visit   Vital Signs:  Patient profile:   77 year old female Pulse rate:   56 / minute BP sitting:   158 / 86  (left arm) Cuff size:   regular  Vitals Entered By: Lamar Sprinkles, CMA (September 14, 2010 9:35 AM) CC: BP Check Comments MD informed, patient to continue current medications and come in to the office next week for another BP check.   Current Medications (verified): 1)  Metformin Hcl 500 Mg  Tabs (Metformin Hcl) .... Take 1 Tablet By Mouth Two Times A Day 2)  First-Bxn Mouthwash  Susp (Diphenhyd-Lidocaine-Nystatin) .... 5 Cc Qid Swish and Swallow 3)  Alprazolam 0.5 Mg Tabs (Alprazolam) .[name].. 1 By Mouth Q6 As Needed Anxiety 4)  Famotidine 20 Mg Tabs (Famotidine) .[name].. 1 By Mouth Two Times A Day 5)  Lisinopril-Hydrochlorothiazide 20-25 Mg Tabs (Lisinopril-Hydrochlorothiazide) .[name].. 1 Once Daily 6)  Levsin/sl 0.125 Mg Subl (Hyoscyamine Sulfate) .[name].. 1 Q 4-6 Hrs As Needed 7)  Cyanocobalamin 1000 Mcg/ml Inj Soln (Cyanocobalamin) .[name].. 1 Cc Im Weekly X 3 Then Monthly 8)  Amoxicillin 500 Mg Caps (Amoxicillin) .[name].. 1 Capsule Three Times A Day X 10 Days  Allergies (verified): 1)  ! Asa  Orders Added: 1)  Est. Patient Level I [24401]

## 2010-12-26 NOTE — Progress Notes (Signed)
Summary: LISINOPRIL  Phone Note Other Incoming   Caller: [facility] Summary of Call: Pt would like to stay on lisinopril/HCTZ due to cost, Please Advise Initial call taken by: Ami Bullins CMA,  March 09, 2010 10:58 AM  Follow-up for Phone Call        based on previous phone notes cough resolved with cessation of ACE-I ( lisinopril). It is better to take losartan. Is it that expensive as a generic? Follow-up by: Jacques Navy MD,  March 09, 2010 1:12 PM  Additional Follow-up for Phone Call Additional follow up Details #1::        Lisinopril was 11.50 and Losartan was 56.65. Patient would rather have a cough than pay the extra. Ok to resume lisinopril?   Pharm said there are no other options that would be cheaper.  Additional Follow-up by: Lamar Sprinkles, CMA,  March 09, 2010 6:35 PM    Additional Follow-up for Phone Call Additional follow up Details #2::    OK Follow-up by: Jacques Navy MD,  March 10, 2010 4:09 AM  New/Updated Medications: LISINOPRIL-HYDROCHLOROTHIAZIDE 20-25 MG TABS (LISINOPRIL-HYDROCHLOROTHIAZIDE) 1 once daily Prescriptions: LISINOPRIL-HYDROCHLOROTHIAZIDE 20-25 MG TABS (LISINOPRIL-HYDROCHLOROTHIAZIDE) 1 once daily  #90 x 2   Entered by:   Lamar Sprinkles, CMA   Authorized by:   Jacques Navy MD   Signed by:   Lamar Sprinkles, CMA on 03/10/2010   Method used:   Electronically to        [facility] [phone]* (retail)       [address]       [place], [place]  [address]       Ph: [phone]       Fax: [phone]   RxID:   [id number]

## 2010-12-26 NOTE — Progress Notes (Signed)
Summary: REFILL Prudy Feeler  Phone Note Refill Request Message from:  Pharmacy  Refills Requested: Medication #1:  ALPRAZOLAM 0.5 MG TABS 1 by mouth q6 as needed anxiety Initial call taken by: Lamar Sprinkles, CMA,  April 27, 2010 2:31 PM  Follow-up for Phone Call        ok for refill x 5 Follow-up by: Jacques Navy MD,  April 27, 2010 4:47 PM    Prescriptions: ALPRAZOLAM 0.5 MG TABS (ALPRAZOLAM) 1 by mouth q6 as needed anxiety  #100 x 5   Entered by:   Lamar Sprinkles, CMA   Authorized by:   Jacques Navy MD   Signed by:   Lamar Sprinkles, CMA on 04/27/2010   Method used:   Telephoned to ...       [facility]  [address] [phone]* (retail)       [address]       [place], [place]  [address]       Ph: [phone]       Fax: [phone]   RxID:   [id number]

## 2010-12-26 NOTE — Assessment & Plan Note (Signed)
Summary: follow up/yf   History of Present Illness Visit Type: Follow-up Visit Primary GI MD: Sheryn Bison MD FACP FAGA Primary Provider: Illene Regulus, MD Requesting Provider: n/a Chief Complaint: pt had one episode of nausea and vomiting earlier this week and states the her abdomen feels "irritated".  She denies any abdominal pain, diarrhea, constipation or fever. History of Present Illness:   Continued periodic left lower quadrant pain without gas, bloating, or bowel irregularity. She is up-to-date on her colonoscopy. She had one episode of nausea and vomiting but otherwise denies current GI problems otherwise. Her appetite is good and her weight is stable. She denies dyspepsia, reflux symptoms, or hepatobiliary complaints. She also denies abuse of cigarettes, alcohol, or NSAIDs. She is status post tubal ligation and has a large lower abdominal scar.   GI Review of Systems    Reports nausea and  vomiting.      Denies abdominal pain, acid reflux, belching, bloating, chest pain, dysphagia with liquids, dysphagia with solids, heartburn, loss of appetite, vomiting blood, weight loss, and  weight gain.        Denies anal fissure, black tarry stools, change in bowel habit, constipation, diarrhea, diverticulosis, fecal incontinence, heme positive stool, hemorrhoids, irritable bowel syndrome, jaundice, light color stool, liver problems, rectal bleeding, and  rectal pain.    Current Medications (verified): 1)  Metformin Hcl 500 Mg  Tabs (Metformin Hcl) .... Take 1 Tablet By Mouth Two Times A Day 2)  Amlodipine Besylate 5 Mg Tabs (Amlodipine Besylate) .... Take 1 Tablet Once A Day 3)  First-Bxn Mouthwash  Susp (Diphenhyd-Lidocaine-Nystatin) .... 5 Cc Qid Swish and Swallow 4)  Alprazolam 0.5 Mg Tabs (Alprazolam) .[name].. 1 By Mouth Q6 As Needed Anxiety 5)  Famotidine 20 Mg Tabs (Famotidine) .[name].. 1 By Mouth Two Times A Day 6)  Lisinopril-Hydrochlorothiazide 20-25 Mg Tabs  (Lisinopril-Hydrochlorothiazide) .[name].. 1 By Mouth Once Daily  Allergies (verified): 1)  ! Asa  Past History:  Past medical, surgical, family and social histories (including risk factors) reviewed for relevance to current acute and chronic problems.  Past Medical History: Reviewed history from 08/08/2009 and no changes required. Colonic polyps, hx of Diabetes mellitus, type II Hypertension Low back pain Peripheral vascular disease Chronic neck pain - spondylosis Diverticulosis Arthritis  Past Surgical History: Reviewed history from 06/21/2007 and no changes required. Tubal ligation  Family History: Reviewed history from 08/25/2009 and no changes required. Father who had a cerebrovascular accident at age 35.  A sister has a history of ovarian cancer.  One brother has problems with his liver, another with throat cancer, and one brother is deceased secondary to complications of emphysema. Family History of Colon Cancer:daughter Lung cancer-daughter Family History of Breast Cancer:sister Family History of Diabetes: Daughter  Social History: Reviewed history from 08/25/2009 and no changes required. 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   Review of Systems  The patient denies allergy/sinus, anemia, anxiety-new, arthritis/joint pain, back pain, blood in urine, breast changes/lumps, confusion, cough, coughing up blood, depression-new, fainting, fatigue, fever, headaches-new, hearing problems, heart murmur, heart rhythm changes, itching, menstrual pain, muscle pains/cramps, night sweats, nosebleeds, pregnancy symptoms, shortness of breath, skin rash, sleeping problems, sore throat, swelling of feet/legs, swollen lymph glands, thirst - excessive,  urination - excessive, urination changes/pain,  urine leakage, vision changes, and voice change.    Vital Signs:  Patient profile:   77 year old female Height:      65 inches Weight:      151 pounds BMI:     25.22 Pulse rate:   60 / minute Pulse rhythm:   regular BP sitting:   114 / 68  (left arm) Cuff size:   regular  Vitals Entered By: Francee Piccolo CMA Duncan Dull) (March 02, 2010 11:33 AM)  Physical Exam  General:  Well developed, well nourished, no acute distress.healthy appearing.  healthy appearing.   Head:  Normocephalic and atraumatic. Eyes:  PERRLA, no icterus.exam deferred to patient's ophthalmologist.  exam deferred to patient's ophthalmologist.   Abdomen:  Soft, nontender and nondistended. No masses, hepatosplenomegaly or hernias noted. Normal bowel sounds. Rectal:  deferred Psych:  Alert and cooperative. Normal mood and affect.poor memory.  poor memory.     Impression & Recommendations:  Problem # 1:  ABDOMINAL PAIN, LEFT LOWER QUADRANT (ICD-789.04) Assessment Improved Symptomatic diverticulosis with possible pelvic-colonic adhesions. Have prescribed p.r.n. Levsin, probiotic therapy, continue high-fiber diet as tolerated, liberal p.o. fluids, and we'll check screening labs. Orders: TLB-CBC Platelet - w/Differential (85025-CBCD) TLB-BMP (Basic Metabolic Panel-BMET) (80048-METABOL) TLB-Hepatic/Liver Function Pnl (80076-HEPATIC) TLB-TSH (Thyroid Stimulating Hormone) (84443-TSH) TLB-B12, Serum-Total ONLY (54098-J19) TLB-Ferritin (82728-FER) TLB-Folic Acid (Folate) (82746-FOL) TLB-IBC Pnl (Iron/FE;Transferrin) (83550-IBC) TLB-Amylase (82150-AMYL) TLB-Lipase (83690-LIPASE) TLB-Sedimentation Rate (ESR) (85652-ESR)  Problem # 2:  DIVERTICULITIS OF COLON (ICD-562.11) Assessment: Comment Only  Problem # 3:  MEMORY LOSS (ICD-780.93) Assessment: Unchanged  Patient Instructions: 1)  Please go to the basement for labwork. 2)  Begin Levsin as needed. 3)  Multimedia programmer  daily. 4)  The medication list was reviewed and reconciled.  All changed / newly prescribed medications were explained.  A complete medication list was provided to the patient / caregiver. 5)  Copy sent to : Dr. Illene Regulus 6)  Please continue current medications.  7)  Please schedule a follow-up appointment as needed.  8)  Excessive Gas Diet handout given.   Appended Document: follow up/yf    Clinical Lists Changes  Medications: Added new medication of LEVSIN/SL 0.125 MG SUBL (HYOSCYAMINE SULFATE) 1 q 4-6 hrs as needed - Signed Added new medication of ALIGN  CAPS (PROBIOTIC PRODUCT) 1 qd Rx of LEVSIN/SL 0.125 MG SUBL (HYOSCYAMINE SULFATE) 1 q 4-6 hrs as needed;  #40 x 3;  Signed;  Entered by: Ashok Cordia RN;  Authorized by: Mardella Layman MD [facility];  Method used: Electronically to [facility] [address] [phone]*, [address], [place], [place]  [address], Ph: [phone], Fax: [phone]    Prescriptions: LEVSIN/SL 0.125 MG SUBL (HYOSCYAMINE SULFATE) 1 q 4-6 hrs as needed  #40 x 3   Entered by:   Ashok Cordia RN   Authorized by:   Mardella Layman MD [facility]   Signed by:   Ashok Cordia RN on 03/02/2010   Method used:   Electronically to        [facility] [address] [phone]* (retail)       [address]       [place], [place]  [address]       Ph: [phone]       Fax: [phone]   RxID:   [id number]

## 2010-12-26 NOTE — Assessment & Plan Note (Signed)
Summary: f/u from UC/SD   Vital Signs:  Patient profile:   77 year old female Height:      65 inches Weight:      160 pounds BMI:     26.72 O2 Sat:      99 % on Room air Temp:     98.3 degrees F oral Pulse rate:   43 / minute BP sitting:   160 / 80  (right arm) Cuff size:   regular  Vitals Entered By: Bill Salinas CMA (September 07, 2010 10:46 AM)  O2 Flow:  Room air CC: pt here to discuss BP medication, pt was seen at [facility] sunday for BP issues and was told to stop her lisinopril./ ab   Primary Care Provider:  Illene Regulus, MD  CC:  pt here to discuss BP medication and pt was seen at [facility] sunday for BP issues and was told to stop her lisinopril./ ab.  History of Present Illness: Patient was found at Urgent Care to have a heart rate in the 40s. She has been asymptomatic: no dizziness, syncope, chest pain or other symptoms. As self-treatment she stopped lisinopril/hct as possible causative agent but continued to take amlodipine.   Current Medications (verified): 1)  Metformin Hcl 500 Mg  Tabs (Metformin Hcl) .... Take 1 Tablet By Mouth Two Times A Day 2)  Amlodipine Besylate 5 Mg Tabs (Amlodipine Besylate) .... Take 1 Tablet Once A Day 3)  First-Bxn Mouthwash  Susp (Diphenhyd-Lidocaine-Nystatin) .... 5 Cc Qid Swish and Swallow 4)  Alprazolam 0.5 Mg Tabs (Alprazolam) .[name].. 1 By Mouth Q6 As Needed Anxiety 5)  Famotidine 20 Mg Tabs (Famotidine) .[name].. 1 By Mouth Two Times A Day 6)  Lisinopril-Hydrochlorothiazide 20-25 Mg Tabs (Lisinopril-Hydrochlorothiazide) .[name].. 1 Once Daily 7)  Levsin/sl 0.125 Mg Subl (Hyoscyamine Sulfate) .[name].. 1 Q 4-6 Hrs As Needed 8)  Cyanocobalamin 1000 Mcg/ml Inj Soln (Cyanocobalamin) .[name].. 1 Cc Im Weekly X 3 Then Monthly 9)  Amoxicillin 500 Mg Caps (Amoxicillin) .[name].. 1 Capsule Three Times A Day X 10 Days  Allergies (verified): 1)  ! Jonne Ply  Past History:  Past Medical History: Last updated: 08/08/2009 Colonic polyps, hx of Diabetes mellitus, type  II Hypertension Low back pain Peripheral vascular disease Chronic neck pain - spondylosis Diverticulosis Arthritis  Past Surgical History: Last updated: 06/21/2007 Tubal ligation  Family History: Last updated: 08/25/2009 Father who had a cerebrovascular accident at age 58.  A sister has a history of ovarian cancer.  One brother has problems with his liver, another with throat cancer, and one brother is deceased secondary to complications of emphysema. 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   Review of Systems  The patient denies anorexia, weight loss, weight gain, chest pain, syncope, dyspnea on exertion, headaches, abdominal pain, muscle weakness, and difficulty walking.    Physical Exam  General:  WNWD AA female in no distress Head:  normocephalic and atraumatic.   Eyes:  vision grossly intact, pupils equal, and pupils round.   Lungs:  normal respiratory effort and no intercostal retractions.   Heart:  Regular bradycardia on exam. After 30 sec step-test her heart rate  rose from 43 to 68.  12 lead EKG and rhythm strip with sinus brady with no conduction abnormality.  Skin:  turgor normal and color normal.     Impression & Recommendations:  Problem # 1:  BRADYCARDIA (ICD-427.89)  Patient with asymptomatic sinus bradycardia with a normal response to exercise.   Plan - resume lisinopril/hct           stop amlodipine           Return for BP check.   Orders: EKG w/ Interpretation (93000)  Complete Medication List: 1)  Metformin Hcl 500 Mg Tabs (Metformin  hcl) .... Take 1 tablet by mouth two times a day 2)  Amlodipine Besylate 5 Mg Tabs (Amlodipine besylate) .... Take 1 tablet once a day 3)  First-bxn Mouthwash Susp (Diphenhyd-lidocaine-nystatin) .... 5 cc qid swish and swallow 4)  Alprazolam 0.5 Mg Tabs (Alprazolam) .[name].. 1 by mouth q6 as needed anxiety 5)  Famotidine 20 Mg Tabs (Famotidine) .[name].. 1 by mouth two times a day 6)  Lisinopril-hydrochlorothiazide 20-25 Mg Tabs (Lisinopril-hydrochlorothiazide) .[name].. 1 once daily 7)  Levsin/sl 0.125 Mg Subl (Hyoscyamine sulfate) .[name].. 1 q 4-6 hrs as needed 8)  Cyanocobalamin 1000 Mcg/ml Inj Soln (Cyanocobalamin) .[name].. 1 cc im weekly x 3 then monthly 9)  Amoxicillin 500 Mg Caps (Amoxicillin) .[name].. 1 capsule three times a day x 10 days

## 2010-12-26 NOTE — Progress Notes (Signed)
Summary: WORK IN  Phone Note Call from Patient   Summary of Call: Pt's daughter called. Pt was seen in the ER last week for severe h/a and she had collapsed. Pt collapsed x 2 this weekend and had another severe h/a. Ok work in per dr, pt's daughter informed of apt.  Initial call taken by: Lamar Sprinkles, CMA,  December 26, 2009 9:11 AM

## 2010-12-26 NOTE — Assessment & Plan Note (Signed)
Summary: COUGH/ FOR A MONTH/NWS   Vital Signs:  Patient profile:   77 year old female Height:      65 inches Weight:      149.50 pounds BMI:     24.97 O2 Sat:      97 % on Room air Temp:     97.0 degrees F oral Pulse rate:   60 / minute BP sitting:   142 / 86  (left arm) Cuff size:   regular  Vitals Entered By: Lucious Groves (February 24, 2010 2:21 PM)  O2 Flow:  Room air CC: Pt c/o cough that began last month, it is a dry non productive cough each AM. Pt also c/o occ left side/pelvic area feels irritated./kb Is Patient Diabetic? Yes Did you bring your meter with you today? No Pain Assessment Patient in pain? no        Primary Care Provider:  Illene Regulus, MD  CC:  Pt c/o cough that began last month and it is a dry non productive cough each AM. Pt also c/o occ left side/pelvic area feels irritated./kb.  History of Present Illness: Patient presents for 1 month h/o dry cough. This is a dry hacky cough. NO fever, no chills no other symptoms. She has had no respiratory illness.   Had a single episode of spontaneous asymptomatic emesis.  She has been having LLQ pain: intermittent. NOt related to straining or work. No change in bowels, no blood or mucus in the stool.   Current Medications (verified): 1)  Metformin Hcl 500 Mg  Tabs (Metformin Hcl) .... Take 1 Tablet By Mouth Two Times A Day 2)  Amlodipine Besylate 5 Mg Tabs (Amlodipine Besylate) .... Take 1 Tablet Once A Day 3)  First-Bxn Mouthwash  Susp (Diphenhyd-Lidocaine-Nystatin) .... 5 Cc Qid Swish and Swallow 4)  Alprazolam 0.5 Mg Tabs (Alprazolam) .[name].. 1 By Mouth Q6 As Needed Anxiety 5)  Famotidine 20 Mg Tabs (Famotidine) .[name].. 1 By Mouth Two Times A Day 6)  Lisinopril-Hydrochlorothiazide 20-25 Mg Tabs (Lisinopril-Hydrochlorothiazide) .[name].. 1 By Mouth Once Daily  Allergies (verified): 1)  ! Jonne Ply  Past History:  Past Medical History: Last updated: 08/08/2009 Colonic polyps, hx of Diabetes mellitus, type  II Hypertension Low back pain Peripheral vascular disease Chronic neck pain - spondylosis Diverticulosis Arthritis  Past Surgical History: Last updated: 06/21/2007 Tubal ligation  Family History: Last updated: 08/25/2009 Father who had a cerebrovascular accident at age 1.  A sister has a history of ovarian cancer.  One brother has problems with his liver, another with throat cancer, and one brother is deceased secondary to complications of emphysema. 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   Risk Factors: Exercise: no (08/25/2009)  Risk Factors: Smoking Status: never (03/22/2008)  Review of Systems  The patient denies anorexia, fever, weight loss, weight gain, chest pain, dyspnea on exertion, peripheral edema, headaches, abdominal pain, severe indigestion/heartburn, incontinence, suspicious skin lesions, difficulty walking, abnormal bleeding, and angioedema.    Physical Exam  General:  WNWD AA female in no distress Head:  Normocephalic and atraumatic without obvious abnormalities. No apparent alopecia or balding. Eyes:  pupils equal,  pupils round, corneas and lenses clear, and no injection.   Neck:  supple.   Lungs:  Normal respiratory effort, chest expands symmetrically. Lungs are clear to auscultation, no crackles or wheezes. Heart:  Normal rate and regular rhythm. S1 and S2 normal without gallop, murmur, click, rub or other extra sounds. Abdomen:  soft, non-tender, normal bowel sounds, no masses, no guarding, and no rebound tenderness.     Impression &  Recommendations:  Problem # 1:  COUGH (ICD-786.2) question of ACE-I related cough  Plan - hold lisinopril          substititue benicar HCT. If cough resolves will continue generic ARB  Complete Medication List: 1)  Metformin Hcl 500 Mg Tabs (Metformin hcl) .... Take 1 tablet by mouth two times a day 2)  Amlodipine Besylate 5 Mg Tabs (Amlodipine besylate) .... Take 1 tablet once a day 3)  First-bxn Mouthwash Susp (Diphenhyd-lidocaine-nystatin) .... 5 cc qid swish and swallow 4)  Alprazolam 0.5 Mg Tabs (Alprazolam) .[name].. 1 by mouth q6 as needed anxiety 5)  Famotidine 20 Mg Tabs (Famotidine) .[name].. 1 by mouth two times a day 6)  Lisinopril-hydrochlorothiazide 20-25 Mg Tabs (Lisinopril-hydrochlorothiazide) .[name].. 1 by mouth once daily  Patient Instructions: 1)  cough - normal exam with no signs of respiratory infection. Suspect the cough may be caused by the lisinopril (BP medication). Plan - stop lisinopril/hct, and substitute benicar/Hct 20/12.5 once a day. If the cough goes away we will make a permanent change in your medication. 2)  Stomach pain - normal exam with no mass or signs of infection. Be sure you have an easy no-strain bowel habit. Let me know if the pain doesn't get better or if it gets worse.    Not Administered:    Influenza Vaccine not given due to: declined

## 2010-12-26 NOTE — Progress Notes (Signed)
Summary: abd pain   Phone Note Call from Patient Call back at Home Phone [phone]   Caller: Patient Call For: Dr. Jarold Motto Reason for Call: Talk to Nurse Summary of Call: pt states she cannot sleep because her stomach, lower abd, is hurting too much and would like to speak to a nurse about it and would like meds called in Initial call taken by: Vallarie Mare,  Apr 11, 2010 10:46 AM  Follow-up for Phone Call        Pt did not get the Rx for Levsin that was prescribed at last OV.   Pt instructed to get rx filled and try it.  This was given for these symptoms.  Pt instructed to report back. Follow-up by: Ashok Cordia RN,  Apr 11, 2010 12:28 PM

## 2010-12-26 NOTE — Assessment & Plan Note (Signed)
Summary: MONTHLY B12 SHOT...LSW.  Nurse Visit   Allergies: 1)  ! Asa  Medication Administration  Injection # 1:    Medication: Vit B12 1000 mcg    Diagnosis: VITAMIN B12 DEFICIENCY (ICD-266.2)    Route: IM    Site: L deltoid    Exp Date: 03/26/2012    Lot #: [id number]    Mfr: [employer]    Patient tolerated injection without complications    Given by: Harlow Mares CMA (AAMA) (June 22, 2010 11:12 AM)  Orders Added: 1)  Vit B12 1000 mcg [J3420]

## 2010-12-28 NOTE — Progress Notes (Signed)
Summary: REFILL   Phone Note Refill Request   Refills Requested: Medication #1:  ALPRAZOLAM 0.5 MG TABS 1 by mouth q6 as needed anxiety [facility] [address]  Initial call taken by: Lamar Sprinkles, CMA,  December 08, 2010 11:02 AM  Follow-up for Phone Call        ok for refill z 5 Follow-up by: Jacques Navy MD,  December 08, 2010 12:52 PM    Prescriptions: ALPRAZOLAM 0.5 MG TABS (ALPRAZOLAM) 1 by mouth q6 as needed anxiety  #100 x 5   Entered by:   Rock Nephew CMA   Authorized by:   Jacques Navy MD   Signed by:   Rock Nephew CMA on 12/08/2010   Method used:   Telephoned to ...       [facility]  [address] [phone]* (retail)       [address]       [place], [place]  [address]       Ph: [phone]       Fax: [phone]   RxID:   [id number]

## 2011-01-15 ENCOUNTER — Telehealth: Payer: Self-pay | Admitting: Internal Medicine

## 2011-01-18 ENCOUNTER — Other Ambulatory Visit: Payer: Self-pay | Admitting: Gastroenterology

## 2011-01-18 ENCOUNTER — Other Ambulatory Visit: Payer: PRIVATE HEALTH INSURANCE

## 2011-01-18 ENCOUNTER — Encounter (INDEPENDENT_AMBULATORY_CARE_PROVIDER_SITE_OTHER): Payer: Self-pay | Admitting: *Deleted

## 2011-01-18 DIAGNOSIS — E538 Deficiency of other specified B group vitamins: Secondary | ICD-10-CM

## 2011-01-18 LAB — VITAMIN B12: Vitamin B-12: 1025 pg/mL — ABNORMAL HIGH (ref 211–911)

## 2011-01-23 NOTE — Progress Notes (Signed)
Summary: Labs  Phone Note Call from Patient Call back at Home Phone [phone]   Caller: Daughter  Summary of Call: Pt's daugther called - wants b12 level checked. OK or does pt need office visit?  Initial call taken by: Lamar Sprinkles, CMA,  January 15, 2011 3:33 PM  Follow-up for Phone Call        last B12 03/02/10 - 266 normal. OK for B12 level 266.2 Follow-up by: Jacques Navy MD,  January 15, 2011 5:40 PM  Additional Follow-up for Phone Call Additional follow up Details #1::        Can you please help set up this b12 lab apt? THANKS Additional Follow-up by: Lamar Sprinkles, CMA,  January 16, 2011 2:37 PM    Additional Follow-up for Phone Call Additional follow up Details #2::    order in epic for lab, I called pt to inform. Follow-up by: Verdell Face,  January 17, 2011 8:43 AM

## 2011-02-11 LAB — URINALYSIS, ROUTINE W REFLEX MICROSCOPIC
Glucose, UA: NEGATIVE mg/dL
Hgb urine dipstick: NEGATIVE
Protein, ur: NEGATIVE mg/dL
Specific Gravity, Urine: 1.013 (ref 1.005–1.030)
pH: 7.5 (ref 5.0–8.0)

## 2011-02-11 LAB — DIFFERENTIAL
Lymphs Abs: 1.1 10*3/uL (ref 0.7–4.0)
Monocytes Absolute: 0.3 10*3/uL (ref 0.1–1.0)
Monocytes Relative: 5 % (ref 3–12)
Neutro Abs: 5.3 10*3/uL (ref 1.7–7.7)
Neutrophils Relative %: 76 % (ref 43–77)

## 2011-02-11 LAB — COMPREHENSIVE METABOLIC PANEL
ALT: 10 U/L (ref 0–35)
Albumin: 4.1 g/dL (ref 3.5–5.2)
Calcium: 9.2 mg/dL (ref 8.4–10.5)
Glucose, Bld: 152 mg/dL — ABNORMAL HIGH (ref 70–99)
Sodium: 140 mEq/L (ref 135–145)
Total Protein: 7.1 g/dL (ref 6.0–8.3)

## 2011-02-11 LAB — CK TOTAL AND CKMB (NOT AT ARMC)
CK, MB: 1.8 ng/mL (ref 0.3–4.0)
Relative Index: INVALID (ref 0.0–2.5)
Total CK: 83 U/L (ref 7–177)

## 2011-02-11 LAB — CBC
Hemoglobin: 13.1 g/dL (ref 12.0–15.0)
MCHC: 31.8 g/dL (ref 30.0–36.0)
RBC: 4.76 MIL/uL (ref 3.87–5.11)
WBC: 6.9 10*3/uL (ref 4.0–10.5)

## 2011-02-11 LAB — URINE CULTURE

## 2011-02-11 LAB — GLUCOSE, CAPILLARY: Glucose-Capillary: 121 mg/dL — ABNORMAL HIGH (ref 70–99)

## 2011-02-11 LAB — APTT: aPTT: 28 seconds (ref 24–37)

## 2011-02-16 ENCOUNTER — Other Ambulatory Visit (INDEPENDENT_AMBULATORY_CARE_PROVIDER_SITE_OTHER): Payer: PRIVATE HEALTH INSURANCE

## 2011-02-16 ENCOUNTER — Ambulatory Visit (INDEPENDENT_AMBULATORY_CARE_PROVIDER_SITE_OTHER): Payer: PRIVATE HEALTH INSURANCE | Admitting: Internal Medicine

## 2011-02-16 DIAGNOSIS — E119 Type 2 diabetes mellitus without complications: Secondary | ICD-10-CM

## 2011-02-16 DIAGNOSIS — E538 Deficiency of other specified B group vitamins: Secondary | ICD-10-CM

## 2011-02-16 DIAGNOSIS — I10 Essential (primary) hypertension: Secondary | ICD-10-CM

## 2011-02-16 DIAGNOSIS — M542 Cervicalgia: Secondary | ICD-10-CM

## 2011-02-16 LAB — COMPREHENSIVE METABOLIC PANEL
AST: 18 U/L (ref 0–37)
Alkaline Phosphatase: 65 U/L (ref 39–117)
BUN: 13 mg/dL (ref 6–23)
Glucose, Bld: 113 mg/dL — ABNORMAL HIGH (ref 70–99)
Sodium: 141 mEq/L (ref 135–145)
Total Bilirubin: 0.3 mg/dL (ref 0.3–1.2)
Total Protein: 6.7 g/dL (ref 6.0–8.3)

## 2011-02-16 LAB — HEMOGLOBIN A1C: Hgb A1c MFr Bld: 6.4 % (ref 4.6–6.5)

## 2011-02-16 NOTE — Progress Notes (Signed)
Subjective:    Patient ID: Alyssa Lindsey, female    DOB: 06/14/1929, 77 y.o.   MRN: [id number]  HPI  Alyssa Lindsey presents for evaluation of neck pain. Four days ago she developed pain in the posterior neck with decreased range of motion. She had taken APAP with good results. She denies any weakness or paresthesia in the UE. She reports that she is now doing better.  She does report a very mild headache that is global in distribution. She did get relief with APAP. She denies double vision, paresthesia in the face, cognitive impairment  Past Medical History  Diagnosis Date  . History of colon polyps   . Type II or unspecified type diabetes mellitus without mention of complication, not stated as uncontrolled   . HTN (hypertension)   . LBP (low back pain)   . PVD (peripheral vascular disease)   . Chronic neck pain   . Spondylosis   . Diverticulosis   . Arthritis    Past Surgical History  Procedure Date  . Tubal ligation    Fam Hx and Soc Hx reviewed for relevance     Review of Systems  Constitutional: Negative.   HENT: Negative.   Eyes: Negative.   Respiratory: Negative.   Cardiovascular: Negative.   Gastrointestinal: Negative.   Genitourinary: Negative.   Musculoskeletal: Negative.   Skin: Negative.        Objective:   Physical Exam  Constitutional: She is oriented to person, place, and time. She appears well-developed and well-nourished.  HENT:  Head: Normocephalic and atraumatic.  Right Ear: External ear normal.  Left Ear: External ear normal.  Nose: Nose normal.  Mouth/Throat: Oropharynx is clear and moist.  Eyes: Conjunctivae and EOM are normal. Pupils are equal, round, and reactive to light.  Neck: Normal range of motion. Neck supple. No thyromegaly present.       Nl flexion and extention, rotation, shoulder shrug.  Cardiovascular: Normal rate and regular rhythm.   Pulmonary/Chest: Effort normal and breath sounds normal.  Musculoskeletal: Normal range of  motion.  Neurological: She is alert and oriented to person, place, and time. She has normal reflexes. No cranial nerve deficit.       normal facial muscle movement. DTR's normal at biceps, radial tendons.          Assessment & Plan:  1. Neck pain - minor with no focal abnormalities on exam.  Plan - neck rolls, shrugs; APAP as needed  2. Headache - non-focal neuro exam and her symptoms are minor  3. DM due for lab with recommendations to follow  Addendum - A1C reveals good control  4. Vit B12 deficiency - not getting B12 shots. Will check lab with recommendations to follow  Addendum- last B12 level in Feb '12 revealed high levels of B12 - repeat testing not needed nor do you need to restart shots.   5. Hypertension - well controlled.  Addendum - lab results  Lab Results  Component Value Date   WBC 4.0* 03/02/2010   HGB 13.6 03/02/2010   HCT 41.1 03/02/2010   PLT 198.0 03/02/2010   CHOL 156 07/09/2008   TRIG 73 07/09/2008   HDL 50.8 07/09/2008   ALT 11 02/16/2011   AST 18 02/16/2011   NA 141 02/16/2011   K 3.5 02/16/2011   CL 104 02/16/2011   CREATININE 1.2 02/16/2011   BUN 13 02/16/2011   CO2 31 02/16/2011   TSH 2.07 03/02/2010   INR 0.91 12/24/2009   HGBA1C  6.4 02/16/2011   MICROALBUR 4.3* 02/27/2008   Lab Results  Component Value Date   LDLCALC 91 07/09/2008

## 2011-02-16 NOTE — Patient Instructions (Signed)
1. Neck pain - exam is unremarkable for any serious disease. Plan - neck rolls and shoulder shrugs; tylenol as needed 2. Headache - mild with no signs of stroke or TIA. Plan - tylenol 3. Diabetes - will check lab today with recommendations to follow. Continuye the metformin 4. B12 deficiency - will check lab today with recommendations to follow 5. Blood pressure - well controlled on present medications: lisinopril/HCTZ - continue this medication.

## 2011-02-18 ENCOUNTER — Encounter: Payer: Self-pay | Admitting: Internal Medicine

## 2011-02-22 ENCOUNTER — Telehealth: Payer: Self-pay | Admitting: Internal Medicine

## 2011-02-22 NOTE — Telephone Encounter (Signed)
Informed pt .

## 2011-02-22 NOTE — Telephone Encounter (Signed)
B12 is greater than 1000. No treatment needed at this time - may stop shot for 6 months and then we can recheck.  Thanks

## 2011-03-01 LAB — GLUCOSE, CAPILLARY: Glucose-Capillary: 89 mg/dL (ref 70–99)

## 2011-03-02 LAB — DIFFERENTIAL
Basophils Absolute: 0 10*3/uL (ref 0.0–0.1)
Basophils Absolute: 0 10*3/uL (ref 0.0–0.1)
Basophils Relative: 0 % (ref 0–1)
Eosinophils Absolute: 0.1 10*3/uL (ref 0.0–0.7)
Lymphocytes Relative: 29 % (ref 12–46)
Lymphs Abs: 1.5 10*3/uL (ref 0.7–4.0)
Monocytes Absolute: 0.6 10*3/uL (ref 0.1–1.0)
Neutro Abs: 3.1 10*3/uL (ref 1.7–7.7)
Neutrophils Relative %: 56 % (ref 43–77)

## 2011-03-02 LAB — COMPREHENSIVE METABOLIC PANEL
ALT: 14 U/L (ref 0–35)
Albumin: 4.3 g/dL (ref 3.5–5.2)
Alkaline Phosphatase: 67 U/L (ref 39–117)
BUN: 16 mg/dL (ref 6–23)
CO2: 26 mEq/L (ref 19–32)
Calcium: 9.5 mg/dL (ref 8.4–10.5)
Calcium: 10 mg/dL (ref 8.4–10.5)
Chloride: 103 mEq/L (ref 96–112)
Creatinine, Ser: 1.1 mg/dL (ref 0.4–1.2)
GFR calc non Af Amer: 43 mL/min — ABNORMAL LOW (ref >60)
Glucose, Bld: 104 mg/dL — ABNORMAL HIGH (ref 70–99)
Potassium: 3.5 mEq/L (ref 3.5–5.1)
Sodium: 140 mEq/L (ref 135–145)
Total Bilirubin: 0.5 mg/dL (ref 0.3–1.2)
Total Protein: 7.2 g/dL (ref 6.0–8.3)

## 2011-03-02 LAB — CBC
HCT: 41.8 % (ref 36.0–46.0)
Hemoglobin: 14.6 g/dL (ref 12.0–15.0)
MCHC: 32.6 g/dL (ref 30.0–36.0)
MCHC: 32.1 g/dL (ref 30.0–36.0)
MCV: 86 fL (ref 78.0–100.0)
Platelets: 222 10*3/uL (ref 150–400)
RBC: 5.22 MIL/uL — ABNORMAL HIGH (ref 3.87–5.11)
RDW: 15.1 % (ref 11.5–15.5)

## 2011-03-02 LAB — URINALYSIS, ROUTINE W REFLEX MICROSCOPIC
Nitrite: NEGATIVE
Protein, ur: NEGATIVE mg/dL
Specific Gravity, Urine: 1.03 (ref 1.005–1.030)
Urobilinogen, UA: 0.2 mg/dL (ref 0.0–1.0)

## 2011-03-02 LAB — LIPASE, BLOOD: Lipase: 38 U/L (ref 11–59)

## 2011-03-02 LAB — URINE MICROSCOPIC-ADD ON

## 2011-03-02 LAB — GLUCOSE, CAPILLARY: Glucose-Capillary: 93 mg/dL (ref 70–99)

## 2011-03-03 LAB — URINALYSIS, ROUTINE W REFLEX MICROSCOPIC
Bilirubin Urine: NEGATIVE
Nitrite: NEGATIVE
Specific Gravity, Urine: 1.017 (ref 1.005–1.030)
Urobilinogen, UA: 0.2 mg/dL (ref 0.0–1.0)
pH: 6.5 (ref 5.0–8.0)

## 2011-03-03 LAB — CBC
HCT: 43.8 % (ref 36.0–46.0)
Platelets: 211 10*3/uL (ref 150–400)
RDW: 15.3 % (ref 11.5–15.5)
WBC: 4.9 10*3/uL (ref 4.0–10.5)

## 2011-03-03 LAB — BASIC METABOLIC PANEL
BUN: 11 mg/dL (ref 6–23)
Calcium: 10.2 mg/dL (ref 8.4–10.5)
Creatinine, Ser: 0.98 mg/dL (ref 0.4–1.2)
GFR calc non Af Amer: 55 mL/min — ABNORMAL LOW (ref >60)
Glucose, Bld: 89 mg/dL (ref 70–99)
Potassium: 3.7 mEq/L (ref 3.5–5.1)

## 2011-03-04 LAB — URINALYSIS, ROUTINE W REFLEX MICROSCOPIC
Hgb urine dipstick: NEGATIVE
Nitrite: NEGATIVE
Protein, ur: NEGATIVE mg/dL
Specific Gravity, Urine: 1.031 — ABNORMAL HIGH (ref 1.005–1.030)
Urobilinogen, UA: 1 mg/dL (ref 0.0–1.0)

## 2011-04-10 NOTE — Assessment & Plan Note (Signed)
Alyssa Lindsey is a pleasant 77 year old woman who is followed in  our [facility] for cervicalgia.  She was last seen  by me on December 24, 2008.  She has known cervical spondylosis and  cervical central stenosis.   She is back in today complaining of some pain in the neck.  Apparently,  she is having somewhat of a flare-up.  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   Pain is about a 4 on a scale of 10, localized to the posterior cervical  region, and does not radiate into the arms.  Denies any new problems  with balance or gait difficulties.   She reports some relief with Ultracet and with the use a cervical  collar.   Sleep is overall good.  She gets fair relief with current meds.   FUNCTIONAL STATUS:  Tolerable.   She is a high functioning woman who is currently working outside the  home, independent with self-care.  She states she can walk as far she  wants during the day.   Occasionally notes some tingling, not a major problem, however.   REVIEW OF SYSTEMS:  Otherwise negative.   Past medical, social, family history unchanged from previous visits.  She does have known diabetes and hypertension.   Medications prescribed through this clinic include Ultracet 1-2 p.o.  t.i.d. #120 per month and p.r.n. Lidoderm.   PHYSICAL EXAMINATION:  Blood pressure is 147/58, pulse 68, respirations  16, 98% saturated on room air.  She is a well-developed, well-nourished  woman who appears younger than her stated age.   She is oriented x3.  Speech is clear.  Affect is bright.  She is alert,  cooperative, and pleasant.  Follows commands without difficulty.  Answers questions appropriately.   Cranial nerves grossly intact.  Coordination is intact.  Reflexes are 2+  in upper and lower extremities without abnormal tone.  No clonus.  No  tremors are appreciated.  She has normal muscle bulk and in both upper  and lower extremities.   Sensation is intact.   Tandem gait and Romberg test are all performed adequately.   There is significantly diminished range of motion with rotation in her  neck is noted.  She has full shoulder range of motion, however.   No significant tenderness to palpation in the cervical or parascapular  muscles is appreciated.   IMPRESSION:  1. Cervicalgia secondary to cervical spondylosis.  2. Cervical central stenosis with some cord flattening without      evidence of myelopathy on exam.   PLAN:  On further questioning, [name] states she believes she is  having a flare-up currently.  She has had some problems with intolerance  after the past but has been able to take Naprosyn and ibuprofen on  occasions without problems.   She does not need a refill on her Ultracet today.  She continues to use  it up to 1-2 tablets up to 3 times a day and not more than 120 pills per  month; however, she has one more refill.  She has been using gabapentin  as well 600 mg 3 times a day, has been getting it from her primary care  physician.   We will trial her on some Mobic, not more than 20 pills per month, 7.5  mg up to twice a day for flare-up of her neck.   May also consider TENS unit, may also consider medial branch blocks as  her neck  pain becomes more of a problem for her.  This is all discussed  with her.  She has no further questions.  I have answered all the  questions today.  I will see her back pain in 2 months.           ______________________________  Brantley Stage, M.D.     DMK/MedQ  D:  02/10/2009 13:00:33  T:  02/10/2009 23:47:41  Job #:  [id number]   cc:   Rosalyn Gess. Norins, MD  [address]  [place]  [place] [address]

## 2011-04-10 NOTE — Group Therapy Note (Signed)
[name] is a 77 year old married woman who is accompanied by  her daughter, Alyssa Lindsey.  She has been referred by Alyssa Lindsey for  management of neck pain.   Alyssa Lindsey states her neck pain began about 14-15 months ago after she  had a bout of sore throat and cough.  Over the last year, her pain has  gotten somewhat better.  She has participated in the physical therapy  program.  She has used heat and has a TENS unit and uses occasionally a  soft collar to help manage her pain.   She states her average pain is about an 8 on a scale of 10, interfering  with her activity significantly.  Sleep tends to be poor.  She gets a  little-to-fair relief with her current medications.   Pain is described as constant, localized into the posterior neck region,  occasionally radiating down the upper extremities.  Predominantly,  staying in the mid-cervical region posteriorly.   Her functional status is very good.  She indicates that she can walk all  day long.  She is able to climb stairs.  She does not drive.  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   Review of systems is positive for occasional bladder frequency,  currently not a problem at this time, occasional numbness and spasms.  She denies depression, anxiety, or suicidal ideation.   Physicians who have been involved in her care include Alyssa Lindsey and she is  also seeing a neurosurgeon, Alyssa Lindsey.   Past medical history is positive for hypertension and diabetes.   PAST SURGICAL HISTORY:  Positive for tubal ligation.  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   She has had an MRI of her brain, which showed microvasculature, which  was consistent with  microvasculature ischemia.  She had an MRI of her  cervical spine, which showed central stenosis at C4-5 with some cord  flattening, facet arthropathy at multiple levels.   SOCIAL HISTORY:  The patient lives with her husband, her 25 year old  grandson, and her daughter.  She denies alcohol use, denies smoking, and  denies illegal substance use.   FAMILY HISTORY:  Remarkable for mother who died at age 67.  She did have  some problems with her heart.  Father died at age 7.  She comes from a  family of 12 brothers and sisters.  She states there had been multiple  cancers with her siblings including stomach cancer, brain cancer, throat  cancer, one sibling had COPD, another one died in a motor vehicle  accident.   Medications, which she comes in with today include gabapentin 600 mg  t.i.d., however, on further questioning, [name] states that she  takes gabapentin at 7 a.m. in the morning and then once again at 8 p.m.  at night.  She does not take a third dose.  She is also on metformin 500  mg twice a day, lisinopril 20/25 one pill daily, amlodipine 5 mg once a  day.  She has been prescribed oxycodone 5/325 1-2 per day; however, she  states she does not take this because it makes her nauseated.  She has  been on propoxyphene as well and she states that she does not take this  either because it clouds her consciousness.   She has an intolerance to aspirin, states she gets upset stomach when  she takes it.   PHYSICAL EXAMINATION:  VITAL SIGNS:  Today, blood pressure is 143/76,  pulse 48, respiration 20, and 98% saturated on room air.  GENERAL:  She is a well-developed, well-nourished, elderly female who  does appear younger than her stated age.  She does not appear in any  distress.   She is oriented x3.  Speech is clear.  Affect is bright.  She is alert  and cooperative and pleasant.  She follows commands easily.   Cranial nerves are grossly intact.  Coordination is intact.   Reflexes  are 2+ in the upper extremities and intact.  2+ at the patellar tendon,  1+ at the Achilles tendon.  She does not have any abnormal tone.  No  clonus is noted.   Sensation is intact to light touch, pinprick, vibratory sense, and  proprioception in the lower extremities and upper extremities.   She is able to transition easily from sitting to standing.  Gait in the  room is very stable.  Walking in the hall, she displayed good balance  and coordination while walking.  Tandem gait and Romberg test are all  performed adequately.  Heel/toe walking is also performed adequately.  There is no abnormal tone noted in the upper or lower extremities.   Musculoskeletal exam reveals mild upper thoracic kyphosis and flattening  of the lumbar spine.  She has significantly limited range of motion in  her cervical spine.  With respect to rotation, she has about not more  than 10-15 degrees with rotation to the right or left.  Flexion  extension is mildly limited.   She has full shoulder range of motion.  Lumbar range of motion is good.  She has no pain with internal or external rotation of the right hip.  Mild posterior hip pain is noted with internal and external rotation of  the left hip.  She has full range of motion at bilateral knees without  pain and full ankle range of motion.   Upper extremity strength is in the 5/5 range without focal deficits.  Lower extremity strength, hip flexor, knee extensors, dorsiflexors, and  plantar flexors are all 5/5 without focal deficits.   She has mild tenderness to palpation in the cervical paraspinal muscles.   IMPRESSION:  Cervicalgia with MRI showing some central stenosis with  some cord flattening; however, the patient displays very good balance on  exam today.  Reflexes are within normal limits in the lower extremities.  Proprioception and vibratory sense are all intact.   She has significantly diminished range of motion in the cervical  spine.   PLAN:  Reviewing her medications with her, she has been prescribed  Neurontin up to 3 times a day.  She states she only takes it twice a  day.  I have suggested that she try the third dose if she can get a  little bit more relief.  We will have her discontinue her Percocet as  well as her propoxyphene, which she is currently not taking due to  nausea and drowsiness, and I will trial her on Ultracet.  She was given  a prescription for this today 1-2 tablets p.o. t.i.d., but not more than  4 tablets per day.  We will also give her a  trial of some Lidoderm.  She  does have a TENS unit and a soft collar, which she can use on a p.r.n.  basis.  She has had physical therapy already and has some range of  motion exercises, which she does work on.   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  We will  see her back in 5-6 weeks and we would like her to monitor her pain on  the above medications.           ______________________________  Brantley Stage, M.D.     DMK/MedQ  D:  06/18/2008 11:06:20  T:  06/18/2008 23:42:03  Job #:  [id number]   cc:   Barbette Hair. [place], DO  [address] [place], [place] [address]

## 2011-04-10 NOTE — Assessment & Plan Note (Signed)
Alyssa Lindsey is a pleasant 77 year old woman who has been followed  in our [facility] for cervicalgia.  She has known  cervical spondylosis and central stenosis.   She was last seen by me in October 2009.  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  She is back in  today and has continued complaints of posterior neck pain, which is  worse with activity.  No new problems with falls or lower extremity pain  or upper extremity pain or balance problems.   She is in today for a refill of her Ultracet.   Her average pain is about 7 on a scale of 10 localized predominately to  the posterior cervical region in the mid neck and up into the upper neck  area.   Pain interferes with activity occasionally.  Her sleep is fair.  Pain is  described as dull and stabbing in nature.   She gets fair relief with current meds.   FUNCTIONAL STATUS:  She is up walking much of the day.  She does not  climb stairs.  She is independent with self-care and is working 5 hours  a day.   REVIEW OF SYSTEMS:  Negative for problems with control of bowel or  bladder.  Denies dizziness or trouble walking.  Denies drowsiness or  constipation.  She denies suicidal ideation.   No changes in past medical, social, or family history since last visit.   Medications prescribed through this clinic are:  1. Ultracet 1 tablet to 2 tablets up to 3 times a day, not more than      120 tablets per month.  2. P.r.n. Lidoderm.   PHYSICAL EXAMINATION:  VITAL SIGNS:  Blood pressure is 146/111, recheck  138/72, pulse 71, respirations 18, and 98% saturated on room air.  She  states she had some hot flashes yesterday.  I asked her to follow up  with Dr. Artist Pais for this problem.   Alyssa Lindsey is a well-developed, well-nourished African American woman,  who appears younger than her stated age.   She is oriented x3.  Her speech is clear.  Her affect is bright.  She is  alert, cooperative, and pleasant.  She follows commands without  difficulty and answers questions appropriately.  Her cranial nerves are  grossly intact.  Her coordination is intact as well.  Reflexes are 2+  patellar and Achilles tendon.  She has no abnormal tone.  No clonus or  tremors noted.   Sensation is intact in the upper and the lower extremities.   She has significant limitations in the cervical range of motion in all  planes.  She has full shoulder range of motion, however.  Lumbar flexion  is within normal limits.  She has some diminished motion with lumbar  extension.   Her gait is normal.  Tandem gait and Romberg tests are performed  adequately.  Motor strength in the upper and lower extremities is in the  5/5 range without focal deficit.   Minimal tenderness to palpation in the cervical paraspinal muscles or  lumbar paraspinal musculature noted today.   IMPRESSION:  1. Cervicalgia.  2. Cervical central stenosis with some cord flattening without      evidence of myelopathy on exam or per history.  3. Cervical spondylosis.   PLAN:  I discussed treatment options with her.  Will continue to use  Ultracet up to 4 tablets per day.  I  recommend use of soft cervical  collar for increased neck pain as well as a TENS unit.  She is also on  gabapentin.  She takes it twice a day through Dr. Artist Pais.   Regarding complaints of some hot flashes, I asked her to follow back up  with Dr. Artist Pais for this.  I will see her back in a month, may consider  increasing gabapentin slightly.  It felt like she is taking it twice a  day at 6:30 a.m. and 8 p.m.  She might benefit from one more dose after  lunch.  I have considered nonsteroidal anti-inflammatory medication with  this.  She has a history of some intolerance to ASPIRIN with respect to  her GI system and has hypertension.  We will hold off on NSAIDS with  her.  We will see her back in a month.           ______________________________   Brantley Stage, M.D.     DMK/MedQ  D:  12/24/2008 12:23:40  T:  12/25/2008 03:47:27  Job #:  [id number]   cc:   Barbette Hair. [place], DO  [address] [place], [place] [address]

## 2011-04-10 NOTE — Assessment & Plan Note (Signed)
Ms. Siarra Gilkerson is a pleasant 77 year old woman who is followed in  our [facility] for predominantly cervicalgia;  however, in the last month, she has had a 2-week duration of low back  pain.  She was seen in emergency room and a CT of her pelvis with  contrast was done on June 10, 2009, as well as a CT of her abdomen was  also done on the same date.  CT of her pelvis showed multilevel  spondylosis, trace anterolisthesis at L4-5, prominent disk bulge at L3-4  with central canal stenosis.  CT of her abdomen showed fatty liver  infiltrates without acute process.   She was given some steroids in the emergency room as well as some  Percocet and she states that overall she is improving somewhat.  She is  a little bit better now.  They had referred her to Dr. Tamsen Roers.  However,  when she tried to make the appointment, apparently [facility] did not take her insurance at that time.   She continues to have some pain, although it is much better.  It is  located in the low back.  Pain is about 7 on a scale of 10.  Sleep tends  to be poor.  She is getting fair relief with current meds.  She reports,  overall, she thinks she is getting little bit better.  She believes she  does have another appointment with another neurosurgeon.  She is not  completely sure.  She tells me her daughter has possibly arranged this.   Functional status, she can walk at least 30 minutes at a time.  She can  climb stairs.  She does not drive.  She is independent with self-care.  Denies any problems controlling bowel or bladder.  Denies new weakness.  Admits to occasional numbness and spasms.  Denies depression, anxiety or  suicidal ideation.  Review of systems positive for fevers, chills, night  sweats, variations in blood sugar, nausea and vomiting.  I asked her to  follow up with Dr. Debby Bud for these symptoms.   Past medical, social and family history are unchanged other than that  already noted.   Exam; blood pressure is 163/82, pulse 54, respirations 18, 100%  saturated on room air.  She is a well-developed, well-nourished [place]  [place] female who appears younger than her stated age.  She is  oriented x3.  Speech is clear.  Affect is bright.  She is alert,  cooperative and pleasant.  Follows commands without difficulty, answers  my questions appropriately.  Cranial nerves and coordination are intact.  Her reflexes are intact at biceps, triceps, brachioradialis, they are  intact at the patellar and Achilles tendons as well.  No abnormal tone  is noted.  No clonus is noted.  No tremors are appreciated.  Her motor  strength is excellent 5/5 in the upper extremities as well as lower  extremities without focal deficit.  No new sensory deficits are  appreciated.  Transitioning from sitting to standing is done without  difficulty.  Her gait is stable, nonantalgic.  Tandem gait, Romberg test  are performed adequately.   She has limitations in cervical range of motion in all planes.  Lumbar  motion is limited as well today moderately.  Palpation throughout the  cervical, thoracic and lumbar spine without significant tenderness.   She complains of some occasional burning in her right ear.  Sensory exam  done over this area does not reveal  any deficits as well.   IMPRESSION:  1. Cervical spondylosis  2. Lumbar spondylosis with recent CT of her pelvis which incidentally      showed some central canal stenosis at L3-4.   At this point, the patient is not interested in any type of epidural  steroid injection or further workup.  She feels she is getting somewhat  better.  She believes she has another neurosurgical appointment in the  upcoming weeks.  She will talk to her daughter regarding this.  At this  point, she would like medications refilled.  We will go ahead and do  this, Mobic 7.5 one p.o. daily p.r.n. back or neck pain #10 per month.  We will keep her on  Percocet for 2 more weeks 5/325 two to four times  today for back or neck pain #60.  After 2 weeks, she can restart her  Ultracet if she is doing better, 1 p.o. q.i.d. p.r.n. back or neck pain  and explained do not take with Percocet #60.  We will see her back in a  month.  Ms. Musquiz does not exhibit any aberrant behavior.  She uses her  medications appropriately and reports fair relief with their use  currently.            ______________________________  Brantley Stage, M.D.     DMK/MedQ  D:  06/22/2009 13:31:50  T:  06/23/2009 03:42:19  Job #:  [id number]   cc:   Rosalyn Gess. Norins, MD  [address]  [place]  [place] [address]

## 2011-04-10 NOTE — Assessment & Plan Note (Signed)
[name] is a 77 year old married woman who was last seen in  our [facility] on June 18, 2008.  She has been  referred by Dr. Artist Pais.   Alyssa Lindsey is back in today for refill of her Ultracet and Lidoderm  patches.   She indicates that the Ultracet does seem to help her quite a bit  without causing any problems with oversedation or constipation.  At her  last visit, her average pain was about 8 on a scale of 10 interfering  significantly with her activity level.  Today, it is about 4 on a scale  of 10.  She states that she feels the medication is quite helpful.   She attempts to be overall poor sleeper.  Pain is sporadic throughout  the day worsening depending on whether she has been bending or more  active.  Pain improves with rest and medications.  Her pain is described  as being dull and located in the posterior cervical region and in the  mid cervical region.  She gets fairly with current medications.   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  She is  independent with all of her self-care.  She is able to be up on her feet  most of the day.  She is able to climb stairs.  She does not drive.   No changes in review of systems.  Denies depression, anxiety, or  suicidal ideation.  Denies oversedation or constipation.   PAST MEDICAL HISTORY:  Significant for diabetes and hypertension.   She has been married for 50 years.   Medications prescribed through the [facility]  include Ultracet 1-2 p.o. t.i.d., #120.   PHYSICAL EXAMINATION:  VITAL SIGNS:  Blood pressure is 156/79, pulse 62,  respiration 18, and 99% saturate on room air.  GENERAL:  She is well-developed, well-nourished 77 year old woman who  appears younger than her stated age.  She is oriented x3.  Speech is  clear.  Affect is bright.  She is alert, cooperative, and pleasant.  She  follows commands without difficulty.  NEUROLOGIC:  Her cranial nerves are grossly  intact.  Coordination is  intact.  Reflexes are 1+ in the upper and lower extremities.  No  abnormal tone is noted.  No clonus is noted.  Sensory exam is intact to  light touch.  Motor strength is 5/5 in the upper and lower extremities  without focal deficits.  Straight leg raise is negative.  Mild tenderness to palpation in the cervical paraspinal musculature.  She has diminished range of motion in all planes more so to the rotation  to the left.  She has about 5-10 degrees of rotation with the left and  about 30 degrees of rotation to the right.  Flexion and extension are  mildly limited as well.   On examination of her lower extremities revealed maintained range of  motion in both right and left hip with minimal discomfort with internal  rotation bilaterally.  She does not have tenderness over the trochanters  on the left.  Motor strength is good in the lower extremity as well  without focal deficits.   IMPRESSION:  1. Cervicalgia.  2. Cervical central stenosis with some cord flattening.  3. Cervical spondylosis.  4. Occasional left groin pain.   PLAN:  Considered x-ray of the left hip.  Alyssa Lindsey would like to defer  this for now.  She may consider in a couple more months. Her  pain is not  significantly interfering with her activities at this time,  she would  like to defer any kind of workup now.  I will go ahead and refill her  Ultracet.  She finds this to be quite helpful.  She is not experiencing  any problems with oversedation, constipation, or dizziness from its use.  We will write the prescription for Ultracet 1-2 p.o. t.i.d. p.r.n. neck  pain, #120, as well as Lidoderm 5% patch 1-2 patches 12 hours on and 12  hours off, #160, 1 refill on each of these.  Oswestry Disability  Questionnaire reveals 17%.  Disability score, minimal disability.  We  will see her back in 2 months.           ______________________________  Brantley Stage, M.D.     DMK/MedQ  D:   07/30/2008 11:55:32  T:  07/31/2008 02:33:13  Job #:  [id number]   cc:   Barbette Hair. [place], DO  [address] [place], [place] [address]

## 2011-04-10 NOTE — Assessment & Plan Note (Signed)
[name] is a 77 year old woman who is followed in our [facility] for cervicalgia.  She was last seen by me on February 10, 2009.  She has known cervical spondylosis and cervical central canal  stenosis.   She is back in today for a refill of her Mobic.  She takes not more than  20 tablets per month and has been doing well on it without problems with  abdominal complaints.  She does not take it every day.   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   Her average pain varies, it can go up to about 7 on a scale of 10.  Currently, she does describe some occasional burning in her neck which  is worse toward the end of the day.  She reports good relief with  current medications.  She also takes Ultracet up to 4 tablets a day.   She is able to be up and down most of the day and does cleaning work.  She is independent with all self care, is overall high functioning  individual.   She mentions today that she has some right ear burning and redness which  occurred a few times over the last month, that has been on and off, that  last about 2-3 minutes and then it goes away.   Otherwise, review of systems is negative.  She denies any new problems.  No new medications.  No hospitalizations or urgent care visit.   No changes in the past medical, social, or family history since last  visit which was in March 2010.   On exam today, she has a blood pressure 133/59, pulse 63, respirations  16, and 94% saturated on room air.  She is a well-developed, elderly  female who appears somewhat younger than her stated age.   She is oriented x3.  Speech is clear.  Affect is bright.  She is alert,  cooperative, and pleasant.  Follows commands without difficulty, answers  questions appropriately.   She does wear a wig.  Her cranial nerves are grossly intact.  The right  ear was examined briefly, she does not have any erythema or  swelling or  changes to the right ear.  No pinna tenderness is appreciated.   Coordination is intact.  Sensory exam reveals no deficits in the upper  extremities.  Reflexes are 2+ at the biceps, triceps, brachioradialis  without side-to-side differences, 1+ in patellar tendon, 0 at the  ankles.  No abnormal tone is noted.  No clonus is noted.  No tremors are  appreciated.   Motor strength is 5/5 in upper and lower extremities without focal  deficit.   Transitioning from sitting to standing is done with ease for her.  Gait  is not antalgic.  Tandem gait and Romberg test are all performed  adequately.   Range of motion in the cervical spine is noted to be significantly  diminished with rotation to the right.  She has about 25 degrees of  right rotation, 40 degrees of rotation to the left.  She has also  limited in flexion and extension as well as lateral flexion.  She,  however,  has full shoulder range of motion bilaterally.   No tenderness is noted with palpation over the cervical paraspinal  muscles, cervical spinous processes, or intrascapularly.   IMPRESSION:  1. Cervicalgia secondary to cervical spondylosis.  2. Cervical central stenosis with some cord  flattening without      evidence of myelopathy on exam.   PLAN:  The patient continues to wear soft collar at night to help  diminish her neck pain.  She finds it to be quite helpful.  She is  requesting Lidoderm samples, we will accommodate, #10 were given today.  We will refill her Mobic 7.5 mg 1  p.o. daily p.r.n. #20 per month, and  Ultracet, the continues to use 1-4 tablets per day.   I asked her to maintain contact with Dr. Debby Bud for her primary care  needs and I will see her back in 3 months at her request.  Her last  through the cervical flare-ups has resolved and she overall is doing  quite well again.           ______________________________  Brantley Stage, M.D.     DMK/MedQ  D:  04/07/2009  12:26:14  T:  04/08/2009 00:18:44  Job #:  [id number]   cc:   Rosalyn Gess. [place], MD  [address]  [place]  [place] [address]

## 2011-04-13 NOTE — Op Note (Signed)
   NAME:  Alyssa Lindsey, Alyssa Lindsey                        ACCOUNT NO.:  [id number]   MEDICAL RECORD NO.:  [id number]                   PATIENT TYPE:  AMB   LOCATION:  ENDO                                 FACILITY:  [facility]   PHYSICIAN:  Georgiana Spinner, M.D.                 DATE OF BIRTH:  [birth year]   DATE OF PROCEDURE:  DATE OF DISCHARGE:                                 OPERATIVE REPORT   PROCEDURE:  Colonoscopy.   INDICATIONS FOR PROCEDURE:  Hemoccult positivity.   ANESTHESIA:  Demerol 20, Versed 1 mg.   DESCRIPTION OF PROCEDURE:  With the patient mildly sedated in the left  lateral decubitus position, the Olympus videoscopic colonoscope was inserted  in the rectum and passed under direct vision to the cecum identified by the  ileocecal valve and appendiceal orifice both of which were photographed.  From this point, the colonoscope was slowly withdrawn taking circumferential  views of the entire colonic mucosa stopping only in the rectum which  appeared normal on direct and showed possibly small hemorrhoids on  retroflexed view. The endoscope was straightened and withdrawn. The  patient's vital signs and pulse oximeter remained stable. The patient  tolerated the procedure well without apparent complications.   FINDINGS:  Internal hemorrhoids, otherwise, unremarkable colonoscopic  examination to the cecum.   PLAN:  Have the patient followup with me as needed.                                               Georgiana Spinner, M.D.    GMO/MEDQ  D:  05/07/2003  T:  05/07/2003  Job:  [id number]   cc:   S. Kyra Manges, M.D.  [phone]  [place]  [place] [address]  Fax: [phone]

## 2011-04-13 NOTE — Procedures (Signed)
[facility]  Patient:    SONNY, POTH Visit Number: [id number] MRN: [id number]          Service Type: END Location: ENDO Attending Physician:  Charna Elizabeth Dictated by:   Anselmo Rod, M.D. Proc. Date: 08/01/01 Admit Date:  07/31/2001 Discharge Date: 07/31/2001   CC:         Earlene Plater L. Cloward, M.D., [facility]   Procedure Report  DATE OF BIRTH:  05-30-1929.  PROCEDURE:  Colonoscopy.  ENDOSCOPIST:  Anselmo Rod, M.D.  INSTRUMENT USED:  Olympus video colonoscope.  INDICATION FOR PROCEDURE:  A 77 year old female with a history of guaiac-positive stool.  Rule out colonic polyps, masses, hemorrhoids, etc.  PREPROCEDURE PREPARATION:  Informed consent was procured from the patient. The patient was fasted for eight hours prior to the procedure and prepped with a bottle of magnesium citrate and a gallon of NuLytely the night prior to the procedure.  PREPROCEDURE PHYSICAL:  VITAL SIGNS:  The patient had stable vital signs.  NECK:  Supple.  CHEST:  Clear to auscultation.  S1, S2 regular.  ABDOMEN:  Soft with normal bowel sounds.  DESCRIPTION OF PROCEDURE:  The patient was placed in the left lateral decubitus position and sedated with 50 mg of Demerol and 5 mg of Versed intravenously.  Once the patient was adequately sedate and maintained on low-flow oxygen and continuous cardiac monitoring, the Olympus video colonoscope was advanced from the rectum to the cecum without difficulty. Except for a few scattered diverticula, no other abnormalities were seen.  IMPRESSION: 1. Few scattered diverticula. 2. No masses or polyps seen.  RECOMMENDATIONS: 1. Repeat guaiac testing is recommended on an outpatient basis, and colon    cancer screening will be done again in the next 10 years unless the patient    was to develop any abnormal symptoms in the interim. 2. A high-fiber diet has been discussed with her in great  detail, brochures    have been given to her, educational information on diverticular disease has    been handed to her as well. Dictated by:   Anselmo Rod, M.D. Attending Physician:  Charna Elizabeth DD:  08/01/01 TD:  08/02/01 Job: [id number]

## 2011-04-13 NOTE — Op Note (Signed)
[facility]  Patient:    Alyssa Lindsey, Alyssa Lindsey                     MRN: [id number] Proc. Date: 04/25/00 Adm. Date:  [id number] Disc. Date: [id number] Attending:  Lendon Colonel                           Operative Report  PREOPERATIVE DIAGNOSES:       Persistent postmenopausal vaginal bleeding postoperative, history of endometrial polyp.  POSTOPERATIVE DIAGNOSES:      Persistent postmenopausal vaginal bleeding postoperative, history of endometrial polyp.  OPERATION:                    Hysteroscopy with endometrial resection.  SURGEON:                      Katherine Roan, M.D.  DESCRIPTION OF PROCEDURE:     The patient was placed in the lithotomy position and prepped and draped in the usual fashion.  The cervix was carefully dilated.  Hysteroscope was inserted into the uterus and visualization of the uterine cavity revealed it to be irregular because of the previous polypectomy.  The resectoscope was then used to resect out the remaining portion of the endometrium.  I saw no suspicious tissue and all the resected tissue was sent to the lab for study.  [name] tolerated this procedure well.  There was 50 cc fluid deficit. DD:  04/25/00 TD:  04/29/00 Job: [id number]

## 2011-04-13 NOTE — Op Note (Signed)
   NAME:  Alyssa Lindsey, Alyssa Lindsey                        ACCOUNT NO.:  [id number]   MEDICAL RECORD NO.:  [id number]                   PATIENT TYPE:  AMB   LOCATION:  ENDO                                 FACILITY[facility]   PHYSICIAN:  Georgiana Spinner, M.D.                 DATE OF BIRTH:  10-22-1929   DATE OF PROCEDURE:  DATE OF DISCHARGE:                                 OPERATIVE REPORT   PROCEDURE:  Upper endoscopy.   INDICATIONS FOR PROCEDURE:  Hemoccult positivity.   ANESTHESIA:  Demerol 50, Versed 5 mg.   DESCRIPTION OF PROCEDURE:  With the patient mildly sedated in the left  lateral decubitus position, the Olympus videoscopic endoscope was inserted  in the mouth and passed under direct vision through the esophagus which  appeared normal.  There was no evidence of esophagitis or Barrett's  esophagus seen. We entered into the stomach. The fundus, body, antrum,  duodenal bulb and second portion of the duodenum all appeared normal other  than a small fleck of blood in the body of the stomach which was  photographed. The endoscope was then slowly withdrawn taking circumferential  views of the duodenal mucosa until the endoscope was pulled back in the  stomach, placed in retroflexion to view the stomach from below. The  endoscope was then straightened and withdrawn taking circumferential views  of the remaining gastric and esophageal mucosa. The patient's vital signs  and pulse oximeter remained stable. The patient tolerated the procedure well  without apparent complications.   FINDINGS:  Small fleck of blood in stomach, otherwise, unremarkable  examination.   PLAN:  Proceed to colonoscopy.                                               Georgiana Spinner, M.D.    GMO/MEDQ  D:  05/07/2003  T:  05/07/2003  Job:  [id number]   cc:   S. Kyra Manges, M.D.  [phone] [address]  [place]  [place] [address]  Fax: [phone]

## 2011-04-13 NOTE — Procedures (Signed)
[facility]  Patient:    BRYNLEA, SPINDLER                     MRN: [id number] Proc. Date: 06/28/00 Adm. Date:  [id number] Disc. Date: [id number] Attending:  Sabino Gasser                           Procedure Report  PROCEDURE:  Colonoscopy.  INDICATION FOR PROCEDURE:  Colon polyps.  ANESTHESIA:  Demerol 70 mg, Versed 7 mg was given intravenously in divided dose.  DESCRIPTION OF PROCEDURE:  With the patient mildly sedated in the left lateral decubitus position, the Olympus videoscopic colonoscope was inserted in the rectum and passed under direct vision to the cecum. The cecum was identified by the ileocecal valve and appendiceal orifice both of which were photographed. From this point, the colonoscope was slowly withdrawn and pulled back into the terminal ileum which also appeared normal and was photographed and then withdrawn through the colon taking circumferential views of the remaining colonic mucosa which appeared normal. The endoscope was pulled back to the rectum which appeared normal in direct and retroflexed views. The endoscope was straightened and withdrawn. The patients vital signs and pulse oximeter remained stable. The patient tolerated the procedure well without apparent complications.  FINDINGS:  Negative colonoscopic examination.  PLAN:  Repeat examination in 5 years. DD:  06/28/00 TD:  07/01/00 Job: [id number]

## 2011-05-17 ENCOUNTER — Other Ambulatory Visit: Payer: Self-pay | Admitting: Internal Medicine

## 2011-05-23 ENCOUNTER — Encounter: Payer: Self-pay | Admitting: Internal Medicine

## 2011-05-24 ENCOUNTER — Ambulatory Visit (INDEPENDENT_AMBULATORY_CARE_PROVIDER_SITE_OTHER): Payer: PRIVATE HEALTH INSURANCE | Admitting: Internal Medicine

## 2011-05-24 DIAGNOSIS — E1149 Type 2 diabetes mellitus with other diabetic neurological complication: Secondary | ICD-10-CM

## 2011-05-24 MED ORDER — AMITRIPTYLINE HCL 25 MG PO TABS: 25.0000 mg | ORAL_TABLET | Freq: Every day | ORAL | Status: DC

## 2011-05-24 NOTE — Patient Instructions (Signed)
Discomfort in the abdomen - burning in the feet and discomfort in the groin may be due to nerve damage from the diabetes. You had lab work April 23 that was normal. Your exam today is normal except for decreased deep vibratory sensation. Plan - will try amitryptaline 25mg  at bedtime to help the discomfort.    Diabetic Neuropathy Diabetic neuropathy is a common complication caused by diabetes. Neuropathy is a term that means nerve disease or damage. If your diabetes is uncontrolled and you have high blood glucose (sugar) levels, over time, this can lead to damage to nerves throughout your body. There are three types of diabetic neuropathy:    Peripheral.   Autonomic.   Focal.  PERIPHERAL NEUROPATHY Peripheral neuropathy is the most common form of diabetic neuropathy. It causes damage to the nerves of the feet and legs and eventually the hands and arms.   SYMPTOMS Peripheral neuropathy occurs slowly over time. The peripheral nerves sense touch, hot and cold, and pain. When these nerves no longer work:    Your feet become numb.   You can no longer feel pressure or pain in your feet.   You may have burning, stabbing or aching pain.  This can lead to:  Thick calluses over pressure areas.   Pressure sores.   Ulcers. Ulcers can become infected with germs (bacteria) and can even lead to infection in the bones of the feet.  DIAGNOSIS The diagnosis of diabetic neuropathy is difficult at best. Sensory function testing can be done with:  Light touch using a monofilament.   Vibration with tuning fork.   Sharp sensation with pin prick  Other tests that can help diagnose neuropathy are:  Nerve Conduction Velocities (NCV). This checks the transmission of electrical current through a nerve.   Electromyography (EMG). This shows how muscles respond to electrical signals transmitted by nearby nerves.   Quantitative sensory testing, which is used to assess how your nerves respond to vibration  and changes in temperature.  AUTONOMIC NEUROPATHY The autonomic nervous system controls functions that you do not think about. Examples would be:    Heart beat.   Regulation of body temperature.   Blood pressure.   Urination.   Digestion.   Sweating.   Sexual function.  SYMPTOMS The symptoms of autonomic neuropathy vary depending on which nerves are affected.    There can be problems with digestion such as:   Feeling sick to your stomach (nausea).   Vomiting.   Bloating.   Constipation.   Diarrhea.   Abdominal pain.   Difficulty with urination may occur because of the inability to sense when your bladder is full. You may have urine leakage (incontinence) or inability to empty your bladder completely (retention).   Palpitations or a feeling of an abnormal heart beat.   Blood pressure drops on arising (orthostatic hypotension). This can happen when you first sit up or stand up. It causes you to feel:   Dizzy.   Weak.   Faint.   Sexual functioning:   In men, inability to attain and maintain an erection.   In women, vaginal dryness and problems with decreased sexual desire and arousal.  DIAGNOSIS Diagnosis is often based on reported symptoms. Tell your medical caregiver if you experience:    Dizziness.   Constipation.   Diarrhea.   Inappropriate urination or inability to urinate.   Inability to get or maintain an erection.  Tests that may be done include:  An EKG or Holter Monitor. These are  tests that can help show problems with the heart rate or heart rhythm.   X-rays can be used to find if there are problems with your ability to properly empty food from your stomach into the small intestine after eating.  FOCAL NEUROPATHY Focal neuropathy affects just one nerve tract and occurs suddenly. However, it usually improves by itself over time. It does not cause long term damage, and treatments are usually needed only until the problem improves. SYMPTOMS     Examples include:    Abnormal eye movements or abnormal alignment of both eyes.   Weakness in the wrist.   Foot drop, which results in inability to lift the foot properly. This causes abnormal walking or foot movement.  DIAGNOSIS Diagnosis is made based on your symptoms and what your caregiver finds on your exam. Other tests that may be done include:  Nerve Conduction Velocities (NCV). This checks the transmission of electrical current through a nerve.   Electromyography (EMG). This shows how muscles respond to electrical signals transmitted by nearby nerves.   Quantitative sensory testing, which is used to assess how your nerves respond to vibration and changes in temperature.  TREATMENT OF NEUROPATHIES Once nerve damage occurs it cannot be reversed. The goal of treatment is to keep the disease from getting worse. If it gets worse, it will affect more nerve fibers. Controlling your blood (sugar) is the key. You will need to keep your blood glucose and A1c at the target range prescribed by your caregiver. Things that will help control blood glucose levels include:  Blood glucose monitoring.   Meal planning.   Physical activity.   Diabetes medication.  Over time, maintaining lower blood glucose levels helps lessen symptoms. Sometimes, prescription pain medicine is needed. Focal neuropathy can be painful and unpredictable and occurs most often in older adults with diabetes.   SEEK MEDICAL CARE IF:  You develop peripheral nerve symptoms such as burning, numbness, or pain in your feet, legs or hands.   You develop autonomic nerve symptoms such as:   Dizziness.   Abnormal urinary control.   Inability to get an erection.   You develop focal nerve symptoms such as sudden abnormal eye movements or sudden foot drop.  Document Released: 01/21/2002 Document Re-Released: 09/09/2009 [facility] Patient Information 2011 [place], [place].

## 2011-05-24 NOTE — Progress Notes (Signed)
  Subjective:    Patient ID: Alyssa Lindsey, female    DOB: 1929/05/03, 77 y.o.   MRN: [id number]  HPI Alyssa Lindsey present for pain that started in her feet, a burning pain and hot, and radiated up to the abdomen to the LLQ where she is still having discomfort. No fever, or chills, no urinary symptoms, no change in bowel habit. NO aggravating symptoms. Describes the discomfort as numbness. Nothing makes her feel better.   Past Medical History  Diagnosis Date  . History of colon polyps   . Type II or unspecified type diabetes mellitus without mention of complication, not stated as uncontrolled   . HTN (hypertension)   . LBP (low back pain)   . PVD (peripheral vascular disease)   . Chronic neck pain   . Spondylosis   . Diverticulosis   . Arthritis    Past Surgical History  Procedure Date  . Tubal ligation    Family History  Problem Relation Age of Onset  . Stroke Father 59  . Ovarian cancer Sister   . Breast cancer Sister   . Cancer Sister     breast  . Liver disease Brother   . Colon cancer Daughter   . Diabetes Daughter   . Throat cancer Brother   . Cancer Brother     throat  . Emphysema Brother   . Cancer Daughter     lung and colon   History   Social History  . Marital Status: Married    Spouse Name: N/A    Number of Children: 4  . Years of Education: N/A   Occupational History  . Retired Advertising copywriter    Social History Main Topics  . Smoking status: Never Smoker   . Smokeless tobacco: Not on file  . Alcohol Use: No  . Drug Use: Not on file  . Sexually Active: Not on file   Other Topics Concern  . Not on file   Social History Narrative   Married '524 children: 1 son '59; 3 dtrs '53, '62, '64; 9 grandchildren; 4-5 great-grandLives with SO; dtr at home, grandson at [employer] Caffeine Use:  1 cupRegular Exercise -  NO       Review of Systems Review of Systems  Constitutional:  Negative for fever, chills, activity change and unexpected weight change.    HEENT:  Negative for hearing loss, ear pain, congestion, neck stiffness and postnasal drip. Negative for sore throat or swallowing problems. Negative for dental complaints.   Eyes: Negative for vision loss or change in visual acuity.  Respiratory: Negative for chest tightness and wheezing.   Cardiovascular: Negative for chest pain and palpitation. No decreased exercise tolerance Gastrointestinal: No change in bowel habit. No bloating or gas. No reflux or indigestion Genitourinary: Negative for urgency, frequency, flank pain and difficulty urinating.  Musculoskeletal: Negative for myalgias, back pain, arthralgias and gait problem.  Neurological: Negative for dizziness, tremors, weakness and headaches.  Hematological: Negative for adenopathy.  Psychiatric/Behavioral: Negative for behavioral problems and dysphoric mood.       Objective:   Physical Exam Vitals noted - normal Gen'l - elderly AA woman in distress HEENT - nl Chest- clear Cor -RRR Abd - nl BS, no guarding or rebound, no tenderness to deep palpation Neuro - decrease deep vibratory sensation        Assessment & Plan:

## 2011-05-25 DIAGNOSIS — E1149 Type 2 diabetes mellitus with other diabetic neurological complication: Secondary | ICD-10-CM | POA: Insufficient documentation

## 2011-05-25 NOTE — Assessment & Plan Note (Signed)
Patient with burning dysesthesia both feet with radiation of discomfort up the left leg to the groin and LLQ. Abdominal exam is normal.   Plan - trial of amitriptyline 25 mg qhs

## 2011-06-06 ENCOUNTER — Other Ambulatory Visit: Payer: Self-pay | Admitting: Internal Medicine

## 2011-07-11 ENCOUNTER — Telehealth: Payer: Self-pay | Admitting: Gastroenterology

## 2011-07-11 NOTE — Telephone Encounter (Signed)
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 Pt reports she feels as though she needs to throw up, but hasn't since Sunday. She cannot relate the nausea to any food. She had nausea in the past and used Levsin, but she states this isn't the same. Pt informed Dr Jarold Motto isn't here; due to her age and symptoms, I would rather she call her PCP for the problem. If they can't help, Dr Jarold Motto will be back in the am and she call call back. Pt stated understanding.

## 2011-07-20 ENCOUNTER — Other Ambulatory Visit: Payer: Self-pay | Admitting: Gastroenterology

## 2011-08-02 ENCOUNTER — Other Ambulatory Visit: Payer: Self-pay | Admitting: Internal Medicine

## 2011-08-03 NOTE — Telephone Encounter (Signed)
Please advise regarding RF request.  

## 2011-08-06 NOTE — Telephone Encounter (Signed)
Ok for refill x 5 

## 2011-08-06 NOTE — Telephone Encounter (Signed)
Please advise regarding RF 

## 2011-08-08 NOTE — Telephone Encounter (Signed)
Ok for refill x 5 

## 2011-08-09 NOTE — Telephone Encounter (Signed)
Called in.

## 2011-08-27 ENCOUNTER — Ambulatory Visit: Payer: PRIVATE HEALTH INSURANCE | Admitting: Internal Medicine

## 2011-08-27 DIAGNOSIS — Z0289 Encounter for other administrative examinations: Secondary | ICD-10-CM

## 2011-09-01 ENCOUNTER — Ambulatory Visit: Payer: PRIVATE HEALTH INSURANCE | Admitting: Family Medicine

## 2011-09-01 DIAGNOSIS — Z0289 Encounter for other administrative examinations: Secondary | ICD-10-CM

## 2011-09-02 ENCOUNTER — Inpatient Hospital Stay (INDEPENDENT_AMBULATORY_CARE_PROVIDER_SITE_OTHER)
Admission: RE | Admit: 2011-09-02 | Discharge: 2011-09-02 | Disposition: A | Discharge status: Home / Self Care | Payer: PRIVATE HEALTH INSURANCE | Source: Ambulatory Visit | Attending: Emergency Medicine | Admitting: Emergency Medicine

## 2011-09-02 DIAGNOSIS — N390 Urinary tract infection, site not specified: Secondary | ICD-10-CM

## 2011-09-02 LAB — POCT URINALYSIS DIP (DEVICE)
Bilirubin Urine: NEGATIVE
Glucose, UA: NEGATIVE mg/dL
Nitrite: POSITIVE — AB
Urobilinogen, UA: 0.2 mg/dL (ref 0.0–1.0)
pH: 6 (ref 5.0–8.0)

## 2011-09-03 ENCOUNTER — Telehealth: Payer: Self-pay

## 2011-09-03 NOTE — Telephone Encounter (Signed)
[facility] Triage Call Report Triage Record Num: [id number] Operator: Geanie Berlin Patient Name: Alyssa Lindsey Call Date & Time: 09/01/2011 9:43:02AM Patient Phone: [phone] PCP: Illene Regulus Patient Gender: Female PCP Fax : [phone] Patient DOB: [birth year] Practice Name: [facility] Reason for Call: 77 yo Called re dysuria, frequency and leaking foul smelling urine. Onset: 08/27/11. Afebrile. Sasw blood when wiped on 08/27/11. FBS not tested 08/31/11. FBS was < 100 08/30/11. L flank pain present. Advised to see MD within 4 hrs for flank pain and urinary tract symptoms per Flank Pain Guideline. Transferred to [place] office. Protocol(s) Used: Flank Pain Protocol(s) Used: Urinary Symptoms - Female Recommended Outcome per Protocol: See Provider within 4 hours Reason for Outcome: Flank pain Flank pain or low back pain AND urinary tract symptoms Care Advice: ~ Another adult should drive. ~ Call provider if symptoms worsen or new symptoms develop. Increase intake of fluids. Try to drink 8 oz. (.2 liter) every hour when awake, including unsweetened cranberry juice, unless on restricted fluids for other medical reasons. Take sips of fluid or eat ice chips if nauseated or vomiting. ~ ~ SYMPTOM / CONDITION MANAGEMENT ~ List, or take, all current prescription(s), nonprescription or alternative medication(s) to provider for evaluation. Limit carbonated, alcoholic, and caffeinated beverages such as coffee, tea and soda. Avoid nonprescription cold and allergy medications that contain caffeine. Limit intake of tomatoes, fruit juices (except for unsweetened cranberry juice), dairy products, spicy foods, sugar, and artificial sweeteners (aspartame or saccharine). Stop or decrease smoking. Reducing exposure to bladder irritants may help lessen urgency. ~ Analgesic/Antipyretic Advice - Acetaminophen: Consider acetaminophen as directed on label or by pharmacist/provider for pain or  fever PRECAUTIONS: - Use if there is no history of liver disease, alcoholism, or intake of three or more alcohol drinks per day - Only if approved by provider during pregnancy or when breastfeeding - During pregnancy, acetaminophen should not be taken more than 3 consecutive days without telling provider - Do not exceed recommended dose or frequency ~ 09/01/2011 9:59:24AM Page 1 of 1 [id number]

## 2011-09-05 LAB — URINE CULTURE

## 2011-09-06 ENCOUNTER — Other Ambulatory Visit: Payer: Self-pay | Admitting: Internal Medicine

## 2011-11-22 ENCOUNTER — Other Ambulatory Visit: Payer: Self-pay | Admitting: Gastroenterology

## 2011-11-26 ENCOUNTER — Other Ambulatory Visit: Payer: Self-pay | Admitting: *Deleted

## 2011-11-26 MED ORDER — LISINOPRIL-HYDROCHLOROTHIAZIDE 20-25 MG PO TABS: 1.0000 | ORAL_TABLET | Freq: Every day | ORAL | Status: DC

## 2011-11-30 ENCOUNTER — Other Ambulatory Visit: Payer: Self-pay | Admitting: Gastroenterology

## 2011-12-19 ENCOUNTER — Ambulatory Visit: Payer: PRIVATE HEALTH INSURANCE | Admitting: Internal Medicine

## 2012-01-10 ENCOUNTER — Ambulatory Visit: Payer: PRIVATE HEALTH INSURANCE | Admitting: Internal Medicine

## 2012-01-10 ENCOUNTER — Other Ambulatory Visit (INDEPENDENT_AMBULATORY_CARE_PROVIDER_SITE_OTHER): Payer: Medicare Other

## 2012-01-10 ENCOUNTER — Encounter: Payer: Self-pay | Admitting: Internal Medicine

## 2012-01-10 ENCOUNTER — Ambulatory Visit (INDEPENDENT_AMBULATORY_CARE_PROVIDER_SITE_OTHER): Payer: Medicare Other | Admitting: Internal Medicine

## 2012-01-10 DIAGNOSIS — E119 Type 2 diabetes mellitus without complications: Secondary | ICD-10-CM

## 2012-01-10 DIAGNOSIS — E538 Deficiency of other specified B group vitamins: Secondary | ICD-10-CM

## 2012-01-10 DIAGNOSIS — I10 Essential (primary) hypertension: Secondary | ICD-10-CM

## 2012-01-10 DIAGNOSIS — M542 Cervicalgia: Secondary | ICD-10-CM

## 2012-01-10 LAB — COMPREHENSIVE METABOLIC PANEL
Alkaline Phosphatase: 64 U/L (ref 39–117)
CO2: 30 mEq/L (ref 19–32)
Creatinine, Ser: 1.1 mg/dL (ref 0.4–1.2)
GFR: 60.08 mL/min (ref >60.00)
Glucose, Bld: 108 mg/dL — ABNORMAL HIGH (ref 70–99)
Total Bilirubin: 0.6 mg/dL (ref 0.3–1.2)

## 2012-01-10 LAB — CBC WITH DIFFERENTIAL/PLATELET
Basophils Relative: 0.8 % (ref 0.0–3.0)
Eosinophils Absolute: 0.3 10*3/uL (ref 0.0–0.7)
Eosinophils Relative: 6.1 % — ABNORMAL HIGH (ref 0.0–5.0)
Lymphocytes Relative: 28.2 % (ref 12.0–46.0)
Monocytes Relative: 12.3 % — ABNORMAL HIGH (ref 3.0–12.0)
Neutrophils Relative %: 52.6 % (ref 43.0–77.0)
RBC: 4.55 Mil/uL (ref 3.87–5.11)
WBC: 5.3 10*3/uL (ref 4.5–10.5)

## 2012-01-10 LAB — LIPID PANEL
HDL: 50 mg/dL (ref >39.00)
Triglycerides: 131 mg/dL (ref 0.0–149.0)
VLDL: 26.2 mg/dL (ref 0.0–40.0)

## 2012-01-10 LAB — VITAMIN B12: Vitamin B-12: 442 pg/mL (ref 211–911)

## 2012-01-10 LAB — HEMOGLOBIN A1C: Hgb A1c MFr Bld: 6.7 % — ABNORMAL HIGH (ref 4.6–6.5)

## 2012-01-10 MED ORDER — MELOXICAM 15 MG PO TABS: 15.0000 mg | ORAL_TABLET | Freq: Every day | ORAL | Status: DC

## 2012-01-10 NOTE — Progress Notes (Signed)
Subjective:    Patient ID: Alyssa Lindsey, female    DOB: [birth year], 78 y.o.   MRN: [id number]  HPI Mrs. Alyssa Lindsey presents for a 3 week h/o neck pain. She is wearing a soft cervical collar. She has no paresthesia or weakness in the upper extremities. No report of injury or precipitating event. She is taking occasional advil for pain that does help.  She is due for routine follow up of her chronic medical issues. She states that except for the neck pain she has been feeling well.  Past Medical History  Diagnosis Date  . History of colon polyps   . Type II or unspecified type diabetes mellitus without mention of complication, not stated as uncontrolled   . HTN (hypertension)   . LBP (low back pain)   . PVD (peripheral vascular disease)   . Chronic neck pain   . Spondylosis   . Diverticulosis   . Arthritis    Past Surgical History  Procedure Date  . Tubal ligation    Family History  Problem Relation Age of Onset  . Stroke Father 75  . Ovarian cancer Sister   . Breast cancer Sister   . Cancer Sister     breast  . Liver disease Brother   . Colon cancer Daughter   . Diabetes Daughter   . Throat cancer Brother   . Cancer Brother     throat  . Emphysema Brother   . Cancer Daughter     lung and colon   History   Social History  . Marital Status: Married    Spouse Name: N/A    Number of Children: 4  . Years of Education: N/A   Occupational History  . Retired Advertising copywriter    Social History Main Topics  . Smoking status: Never Smoker   . Smokeless tobacco: Not on file  . Alcohol Use: No  . Drug Use: Not on file  . Sexually Active: Not on file   Other Topics Concern  . Not on file   Social History Narrative   Married '524 children: 1 son '59; 3 dtrs '53, '62, '64; 9 grandchildren; 4-5 great-grandLives with SO; dtr at home, grandson at [employer] Caffeine Use:  1 cupRegular Exercise -  NO      Review of Systems System review is negative for any constitutional,  cardiac, pulmonary, GI or neuro symptoms or complaints other than as described in the HPI.     Objective:   Physical Exam Filed Vitals:   01/10/12 1153  BP: 128/76  Pulse: 76  Temp: 97.3 F (36.3 C)  Resp: 16  Weight: 172 lb 8 oz (78.245 kg)  There is no height on file to calculate BMI.  Gen'l - overweight AA woman in no acute distress HEENT- C&S clear Neck- supple with normal range of motion: flexion,extension and rotation. No palpable abnormality. Pulm - normal respirations Cor- RRR Neuro- A&O x 3, normal gait, CN II-XII normal  Lab Results  Component Value Date   WBC 5.3 01/10/2012   HGB 12.1 01/10/2012   HCT 38.2 01/10/2012   PLT 177.0 01/10/2012   GLUCOSE 108* 01/10/2012   CHOL 164 01/10/2012   TRIG 131.0 01/10/2012   HDL 50.00 01/10/2012   LDLCALC 88 01/10/2012   ALT 14 01/10/2012   AST 21 01/10/2012   NA 143 01/10/2012   K 3.5 01/10/2012   CL 108 01/10/2012   CREATININE 1.1 01/10/2012   BUN 13 01/10/2012   CO2 30 01/10/2012  TSH 2.07 03/02/2010   INR 0.91 12/24/2009   HGBA1C 6.7* 01/10/2012   MICROALBUR 4.3* 02/27/2008           Assessment & Plan:  Neck pain - 3 week history but she has well preserved ROM and no radicular findings.  Plan - ROM exercise            NSAID of choice with GI precautions.

## 2012-01-10 NOTE — Patient Instructions (Signed)
Neck -pain is probably a flare of cervical arthritis which we know is there from previous MRI scans and myelogram. On exam there is no evidence of nerve damage.  Plan - take meloxicam 15 mg once a day. DO NOT TAKE ANY OTHER ANTI-INFLAMMATORY MEDICATION LIKE ALEVE, ADVIL ETC.  You may take tylenol 500 mg one or two tablets three times a day.  Lab work today to monitor you diabetes and cholesterol  Make an appointment for a physical exam/ wellness exam

## 2012-01-12 NOTE — Assessment & Plan Note (Signed)
Flare of neck pain with no radicular findings.  Plan - ok to use cerivcal collar           ROM exercise           APAP for pain.

## 2012-01-12 NOTE — Assessment & Plan Note (Signed)
Patient with good control. No significant sequellae  Plan - continue present regimen

## 2012-01-12 NOTE — Assessment & Plan Note (Signed)
BP Readings from Last 3 Encounters:  01/10/12 128/76  05/24/11 138/62  02/16/11 122/80   Very good control. Labs - renal function and electrolytes are normal.  Plan - continue present regimen

## 2012-01-13 ENCOUNTER — Encounter: Payer: Self-pay | Admitting: Internal Medicine

## 2012-01-19 ENCOUNTER — Other Ambulatory Visit: Payer: Self-pay | Admitting: Gastroenterology

## 2012-01-24 ENCOUNTER — Other Ambulatory Visit: Payer: Self-pay | Admitting: Gastroenterology

## 2012-01-25 ENCOUNTER — Other Ambulatory Visit: Payer: Self-pay | Admitting: Gastroenterology

## 2012-01-25 NOTE — Telephone Encounter (Signed)
Left message that pt needs ov before she can have med refill.

## 2012-02-04 ENCOUNTER — Encounter: Payer: Self-pay | Admitting: *Deleted

## 2012-02-07 ENCOUNTER — Telehealth: Payer: Self-pay | Admitting: Gastroenterology

## 2012-02-07 ENCOUNTER — Other Ambulatory Visit (INDEPENDENT_AMBULATORY_CARE_PROVIDER_SITE_OTHER): Payer: Medicare Other

## 2012-02-07 ENCOUNTER — Ambulatory Visit (INDEPENDENT_AMBULATORY_CARE_PROVIDER_SITE_OTHER): Payer: Medicare Other | Admitting: Gastroenterology

## 2012-02-07 ENCOUNTER — Encounter: Payer: Self-pay | Admitting: Gastroenterology

## 2012-02-07 VITALS — BP 130/90 | HR 60 | Ht 65.0 in | Wt 174.2 lb

## 2012-02-07 DIAGNOSIS — K5730 Diverticulosis of large intestine without perforation or abscess without bleeding: Secondary | ICD-10-CM

## 2012-02-07 DIAGNOSIS — G8929 Other chronic pain: Secondary | ICD-10-CM

## 2012-02-07 DIAGNOSIS — R1032 Left lower quadrant pain: Secondary | ICD-10-CM

## 2012-02-07 LAB — CBC WITH DIFFERENTIAL/PLATELET
Basophils Relative: 0.3 % (ref 0.0–3.0)
Eosinophils Absolute: 0.3 10*3/uL (ref 0.0–0.7)
HCT: 37.9 % (ref 36.0–46.0)
Hemoglobin: 12 g/dL (ref 12.0–15.0)
Lymphocytes Relative: 30.9 % (ref 12.0–46.0)
MCHC: 31.5 g/dL (ref 30.0–36.0)
MCV: 84.1 fl (ref 78.0–100.0)
Neutro Abs: 2.4 10*3/uL (ref 1.4–7.7)
RBC: 4.51 Mil/uL (ref 3.87–5.11)

## 2012-02-07 MED ORDER — DICYCLOMINE HCL 10 MG PO CAPS: 10.0000 mg | ORAL_CAPSULE | Freq: Two times a day (BID) | ORAL | Status: DC | PRN

## 2012-02-07 MED ORDER — HYOSCYAMINE SULFATE 0.125 MG SL SUBL: 0.1250 mg | SUBLINGUAL_TABLET | SUBLINGUAL | Status: DC | PRN

## 2012-02-07 NOTE — Progress Notes (Signed)
This is a 1 complicated 78 year old African American female adult onset diabetes and hypertensive cardiovascular disease. She presents with one month of vague left lower quadrant discomfort without associated change in bowel habits, melena or hematochezia. She has mild abdominal gas and bloating him about the nonspecific upper gastrointestinal or hepatobiliary complaints. She is on 15 mg of Mobic daily. She was treated with amoxicillin recently because of her urinary tract infection. Colonoscopy in October of 2010 showed diverticulosis but otherwise was unremarkable.  Current Medications, Allergies, Past Medical History, Past Surgical History, Family History and Social History were reviewed in [employer] record.  Pertinent Review of Systems Negative... chronic pain in her cervical spine area with radiation anteriorly. I have asked her to check with her primary care physician concerning her probable cervical spondylosis.   Physical Exam: Healthy patient no distress. Blood pressure 130/90 and pulse is 60 and regular. BMI 28.99. Cannot appreciate stigmata of chronic liver disease. Her abdomen shows no organomegaly, masses, or tenderness. Bowel sounds are normal. Chest is clear and she appears to be in a regular rhythm without murmurs gallops or rubs. Mental status is normal. Peripheral extremities are unremarkable.    Assessment and Plan: Right diverticulosis with associated gas, bloating, and spasmodic type sigmoid colon spasms. I have given her Levsin to use on a when necessary basis, and have instructed her on a high-fiber diet with daily Metamucil and liberal by mouth fluids. Screening labs ordered to exclude subacute diverticulitis. I do not think she needs colonoscopy at this time. Encounter Diagnosis  Name Primary?  . Diverticulosis of colon (without mention of hemorrhage) Yes

## 2012-02-07 NOTE — Telephone Encounter (Signed)
rx sent

## 2012-02-07 NOTE — Patient Instructions (Addendum)
Please go to the basement today for your labs.  Your prescription(s) have been sent to you pharmacy.  Buy Metamucil OTC and take once a day.   High Fiber Diet A high fiber diet changes your normal diet to include more whole grains, legumes, fruits, and vegetables. Changes in the diet involve replacing refined carbohydrates with unrefined foods. The calorie level of the diet is essentially unchanged. The Dietary Reference Intake (recommended amount) for adult males is 38 g per day. For adult females, it is 25 g per day. Pregnant and lactating women should consume 28 g of fiber per day. Fiber is the intact part of a plant that is not broken down during digestion. Functional fiber is fiber that has been isolated from the plant to provide a beneficial effect in the body. PURPOSE  Increase stool bulk.   Ease and regulate bowel movements.   Lower cholesterol.  INDICATIONS THAT YOU NEED MORE FIBER  Constipation and hemorrhoids.   Uncomplicated diverticulosis (intestine condition) and irritable bowel syndrome.   Weight management.   As a protective measure against hardening of the arteries (atherosclerosis), diabetes, and cancer.  NOTE OF CAUTION If you have a digestive or bowel problem, ask your caregiver for advice before adding high fiber foods to your diet. Some of the following medical problems are such that a high fiber diet should not be used without consulting your caregiver:  Acute diverticulitis (intestine infection).   Partial small bowel obstructions.   Complicated diverticular disease involving bleeding, rupture (perforation), or abscess (boil, furuncle).   Presence of autonomic neuropathy (nerve damage) or gastric paresis (stomach cannot empty itself).  GUIDELINES FOR INCREASING FIBER  Start adding fiber to the diet slowly. A gradual increase of about 5 more grams (2 slices of whole-wheat bread, 2 servings of most fruits or vegetables, or 1 bowl of high fiber cereal) per day  is best. Too rapid an increase in fiber may result in constipation, flatulence, and bloating.   Drink enough water and fluids to keep your urine clear or pale yellow. Water, juice, or caffeine-free drinks are recommended. Not drinking enough fluid may cause constipation.   Eat a variety of high fiber foods rather than one type of fiber.   Try to increase your intake of fiber through using high fiber foods rather than fiber pills or supplements that contain small amounts of fiber.   The goal is to change the types of food eaten. Do not supplement your present diet with high fiber foods, but replace foods in your present diet.  INCLUDE A VARIETY OF FIBER SOURCES  Replace refined and processed grains with whole grains, canned fruits with fresh fruits, and incorporate other fiber sources. White rice, white breads, and most bakery goods contain little or no fiber.   Brown whole-grain rice, buckwheat oats, and many fruits and vegetables are all good sources of fiber. These include: broccoli, Brussels sprouts, cabbage, cauliflower, beets, sweet potatoes, white potatoes (skin on), carrots, tomatoes, eggplant, squash, berries, fresh fruits, and dried fruits.   Cereals appear to be the richest source of fiber. Cereal fiber is found in whole grains and bran. Bran is the fiber-rich outer coat of cereal grain, which is largely removed in refining. In whole-grain cereals, the bran remains. In breakfast cereals, the largest amount of fiber is found in those with "bran" in their names. The fiber content is sometimes indicated on the label.   You may need to include additional fruits and vegetables each day.   In  baking, for 1 cup white flour, you may use the following substitutions:   1 cup whole-wheat flour minus 2 tbs.    cup white flour plus  cup whole-wheat flour.  Document Released: 11/12/2005 Document Revised: 11/01/2011 Document Reviewed: 09/20/2009 [facility] Patient Information 2012 [place],  [place].   Diverticulitis A diverticulum is a small pouch or sac on the colon. Diverticulosis is the presence of these diverticula on the colon. Diverticulitis is the irritation (inflammation) or infection of diverticula. CAUSES  The colon and its diverticula contain bacteria. If food particles block the tiny opening to a diverticulum, the bacteria inside can grow and cause an increase in pressure. This leads to infection and inflammation and is called diverticulitis. SYMPTOMS   Abdominal pain and tenderness. Usually, the pain is located on the left side of your abdomen. However, it could be located elsewhere.   Fever.   Bloating.   Feeling sick to your stomach (nausea).   Throwing up (vomiting).   Abnormal stools.  DIAGNOSIS  Your caregiver will take a history and perform a physical exam. Since many things can cause abdominal pain, other tests may be necessary. Tests may include:  Blood tests.   Urine tests.   X-ray of the abdomen.   CT scan of the abdomen.  Sometimes, surgery is needed to determine if diverticulitis or other conditions are causing your symptoms. TREATMENT  Most of the time, you can be treated without surgery. Treatment includes:  Resting the bowels by only having liquids for a few days. As you improve, you will need to eat a low-fiber diet.   Intravenous (IV) fluids if you are losing body fluids (dehydrated).   Antibiotic medicines that treat infections may be given.   Pain and nausea medicine, if needed.   Surgery if the inflamed diverticulum has burst.  HOME CARE INSTRUCTIONS   Try a clear liquid diet (broth, tea, or water for as long as directed by your caregiver). You may then gradually begin a low-fiber diet as tolerated. A low-fiber diet is a diet with less than 10 grams of fiber. Choose the foods below to reduce fiber in the diet:   White breads, cereals, rice, and pasta.   Cooked fruits and vegetables or soft fresh fruits and vegetables without  the skin.   Ground or well-cooked tender beef, ham, veal, lamb, pork, or poultry.   Eggs and seafood.   After your diverticulitis symptoms have improved, your caregiver may put you on a high-fiber diet. A high-fiber diet includes 14 grams of fiber for every 1000 calories consumed. For a standard 2000 calorie diet, you would need 28 grams of fiber. Follow these diet guidelines to help you increase the fiber in your diet. It is important to slowly increase the amount fiber in your diet to avoid gas, constipation, and bloating.   Choose whole-grain breads, cereals, pasta, and brown rice.   Choose fresh fruits and vegetables with the skin on. Do not overcook vegetables because the more vegetables are cooked, the more fiber is lost.   Choose more nuts, seeds, legumes, dried peas, beans, and lentils.   Look for food products that have greater than 3 grams of fiber per serving on the Nutrition Facts label.   Take all medicine as directed by your caregiver.   If your caregiver has given you a follow-up appointment, it is very important that you go. Not going could result in lasting (chronic) or permanent injury, pain, and disability. If there is any problem keeping the  appointment, call to reschedule.  SEEK MEDICAL CARE IF:   Your pain does not improve.   You have a hard time advancing your diet beyond clear liquids.   Your bowel movements do not return to normal.  SEEK IMMEDIATE MEDICAL CARE IF:   Your pain becomes worse.   You have an oral temperature above 102 F (38.9 C), not controlled by medicine.   You have repeated vomiting.   You have bloody or black, tarry stools.   Symptoms that brought you to your caregiver become worse or are not getting better.  MAKE SURE YOU:   Understand these instructions.   Will watch your condition.   Will get help right away if you are not doing well or get worse.  Document Released: 08/22/2005 Document Revised: 11/01/2011 Document Reviewed:  12/18/2010 [facility] Patient Information 2012 [place], [place].

## 2012-02-08 ENCOUNTER — Emergency Department (HOSPITAL_COMMUNITY): Payer: Medicare Other

## 2012-02-08 ENCOUNTER — Emergency Department (HOSPITAL_COMMUNITY)
Admission: EM | Admit: 2012-02-08 | Discharge: 2012-02-08 | Disposition: A | Discharge status: Home / Self Care | Payer: Medicare Other | Attending: Emergency Medicine | Admitting: Emergency Medicine

## 2012-02-08 ENCOUNTER — Encounter (HOSPITAL_COMMUNITY): Payer: Self-pay | Admitting: Emergency Medicine

## 2012-02-08 DIAGNOSIS — I739 Peripheral vascular disease, unspecified: Secondary | ICD-10-CM | POA: Insufficient documentation

## 2012-02-08 DIAGNOSIS — G8929 Other chronic pain: Secondary | ICD-10-CM | POA: Insufficient documentation

## 2012-02-08 DIAGNOSIS — M545 Low back pain, unspecified: Secondary | ICD-10-CM | POA: Insufficient documentation

## 2012-02-08 DIAGNOSIS — M129 Arthropathy, unspecified: Secondary | ICD-10-CM | POA: Insufficient documentation

## 2012-02-08 DIAGNOSIS — E119 Type 2 diabetes mellitus without complications: Secondary | ICD-10-CM | POA: Insufficient documentation

## 2012-02-08 DIAGNOSIS — M542 Cervicalgia: Secondary | ICD-10-CM | POA: Insufficient documentation

## 2012-02-08 DIAGNOSIS — I10 Essential (primary) hypertension: Secondary | ICD-10-CM | POA: Insufficient documentation

## 2012-02-08 DIAGNOSIS — R51 Headache: Secondary | ICD-10-CM

## 2012-02-08 DIAGNOSIS — Z79899 Other long term (current) drug therapy: Secondary | ICD-10-CM | POA: Insufficient documentation

## 2012-02-08 DIAGNOSIS — H5710 Ocular pain, unspecified eye: Secondary | ICD-10-CM | POA: Insufficient documentation

## 2012-02-08 MED ORDER — KETOROLAC TROMETHAMINE 30 MG/ML IJ SOLN
30.0000 mg | Freq: Once | INTRAMUSCULAR | Status: AC
  Administered 2012-02-08: 30 mg via INTRAVENOUS
  Filled 2012-02-08: qty 1

## 2012-02-08 MED ORDER — NAPROXEN 500 MG PO TABS: 500.0000 mg | ORAL_TABLET | Freq: Two times a day (BID) | ORAL | Status: DC

## 2012-02-08 MED ORDER — METOCLOPRAMIDE HCL 5 MG/ML IJ SOLN
10.0000 mg | Freq: Once | INTRAMUSCULAR | Status: AC
  Administered 2012-02-08: 10 mg via INTRAVENOUS
  Filled 2012-02-08: qty 2

## 2012-02-08 MED ORDER — TETRACAINE HCL 0.5 % OP SOLN
OPHTHALMIC | Status: AC
  Administered 2012-02-08: 06:00:00
  Filled 2012-02-08: qty 2

## 2012-02-08 MED ORDER — MORPHINE SULFATE 4 MG/ML IJ SOLN
4.0000 mg | Freq: Once | INTRAMUSCULAR | Status: AC
  Administered 2012-02-08: 4 mg via INTRAVENOUS
  Filled 2012-02-08: qty 1

## 2012-02-08 NOTE — ED Provider Notes (Signed)
History     CSN: [id number]  Arrival date & time 02/08/12  0445   First MD Initiated Contact with Patient 02/08/12 [phone]      Chief Complaint  Patient presents with  . Migraine    (Consider location/radiation/quality/duration/timing/severity/associated sxs/prior treatment) HPI Comments: 78 year old female with a history of multiple medical problems including diabetes, hypertension, low back pain, peripheral vascular disease, chronic neck pain, diverticulosis, arthritis, gastritis. She presents approximately 24 hours after starting a new medication, Bentyl. She states that last night she developed pain behind her right eye. This radiates into her right temple, is persistent, nothing makes it better or worse and is not associated with change in vision, nauseous, dizziness, vomiting, stiff neck or fevers. She has no difficulty walking or using her hands for fine motor activity. She does not usually get headaches.  Patient is a 78 y.o. female presenting with migraine. The history is provided by the patient.  Migraine    Past Medical History  Diagnosis Date  . Personal history of colonic polyps   . Type II or unspecified type diabetes mellitus without mention of complication, not stated as uncontrolled   . HTN (hypertension)   . LBP (low back pain)   . PVD (peripheral vascular disease)   . Chronic neck pain   . Spondylosis   . Diverticulosis   . Arthritis   . Diverticulosis of colon (without mention of hemorrhage)   . Hiatal hernia   . Gastritis   . Family history of malignant neoplasm of gastrointestinal tract   . Memory problem     Past Surgical History  Procedure Date  . Tubal ligation     Family History  Problem Relation Age of Onset  . Stroke Father 14  . Ovarian cancer Sister   . Breast cancer Sister   . Breast cancer Sister   . Liver disease Brother   . Colon cancer Daughter   . Diabetes Daughter   . Throat cancer Brother   . Emphysema Brother   . Lung cancer  Daughter     History  Substance Use Topics  . Smoking status: Never Smoker   . Smokeless tobacco: Never Used  . Alcohol Use: No    OB History    Grav Para Term Preterm Abortions TAB SAB Ect Mult Living                  Review of Systems  All other systems reviewed and are negative.    Allergies  Aspirin  Home Medications   Current Outpatient Rx  Name Route Sig Dispense Refill  . ALPRAZOLAM 0.5 MG PO TABS Oral Take 0.5 mg by mouth every 6 (six) hours as needed. For anxiety    . AMITRIPTYLINE HCL 25 MG PO TABS Oral Take 1 tablet (25 mg total) by mouth at bedtime. 30 tablet 11  . DICYCLOMINE HCL 10 MG PO CAPS Oral Take 10 mg by mouth 2 (two) times daily as needed. For irritable bowel syndrome    . FAMOTIDINE 20 MG PO TABS Oral Take 20 mg by mouth 2 (two) times daily as needed. For acid reflux    . LISINOPRIL-HYDROCHLOROTHIAZIDE 20-25 MG PO TABS Oral Take 1 tablet by mouth daily. 90 tablet 1  . MELOXICAM 15 MG PO TABS Oral Take 1 tablet (15 mg total) by mouth daily. 30 tablet 5  . METFORMIN HCL 500 MG PO TABS  take 2 tablets by mouth once daily 60 tablet 5  . NAPROXEN 500 MG  PO TABS Oral Take 1 tablet (500 mg total) by mouth 2 (two) times daily with a meal. 30 tablet 0    BP 184/84  Pulse 76  Temp(Src) 97.7 F (36.5 C) (Oral)  Resp 20  SpO2 97%  Physical Exam  Nursing note and vitals reviewed. Constitutional: She appears well-developed and well-nourished. No distress.  HENT:  Head: Normocephalic and atraumatic.  Mouth/Throat: Oropharynx is clear and moist. No oropharyngeal exudate.  Eyes: Conjunctivae and EOM are normal. Pupils are equal, round, and reactive to light. Right eye exhibits no discharge. Left eye exhibits no discharge. No scleral icterus.       Pupils are equal round and reactive to light, extraocular movements are intact, pupils are symmetrical, no haziness to the cornea, no conjunctival injection. There is no swelling in the periorbital tissues, no  tenderness over the eye.  Neck: Normal range of motion. Neck supple. No JVD present. No thyromegaly present.  Cardiovascular: Normal rate, regular rhythm, normal heart sounds and intact distal pulses.  Exam reveals no gallop and no friction rub.   No murmur heard. Pulmonary/Chest: Effort normal and breath sounds normal. No respiratory distress. She has no wheezes. She has no rales.  Abdominal: Soft. Bowel sounds are normal. She exhibits no distension and no mass. There is no tenderness.  Musculoskeletal: Normal range of motion. She exhibits no edema and no tenderness.  Lymphadenopathy:    She has no cervical adenopathy.  Neurological: She is alert. Coordination normal.       Neurologic exam:  Speech clear, pupils equal round reactive to light, extraocular movements intact  Normal peripheral visual fields Cranial nerves III through XII normal including no facial droop Follows commands, moves all extremities x4, normal strength to bilateral upper and lower extremities at all major muscle groups including grip Sensation normal to light touch and pinprick Coordination intact, no limb ataxia, finger-nose-finger normal Rapid alternating movements normal No pronator drift Gait normal   Skin: Skin is warm and dry. No rash noted. No erythema.  Psychiatric: She has a normal mood and affect. Her behavior is normal.    ED Course  Procedures (including critical care time)  Labs Reviewed - No data to display Ct Head Wo Contrast  02/08/2012  *RADIOLOGY REPORT*  Clinical Data: Right-sided headache  CT HEAD WITHOUT CONTRAST  Technique:  Contiguous axial images were obtained from the base of the skull through the vertex without contrast.  Comparison: 12/24/2009  Findings: Periventricular and subcortical white matter hypodensities are most in keeping with chronic microangiopathic change. There is no evidence for acute hemorrhage, hydrocephalus, mass lesion, or abnormal extra-axial fluid collection.  No  definite CT evidence for acute infarction.  The visualized paranasal sinuses and mastoid air cells are predominately clear.  IMPRESSION: White matter hypodensities.  No definite acute intracranial abnormality.  Original Report Authenticated By: Waneta Martins, M.D.     1. Headache       MDM  Check intraocular pressure, CT scan is negative as patient is at extreme of age and has unusual severe right-sided headache.  After tetracaine administration, intraocular pressure measured at 20.    Patient reexamined, symptoms are almost resolved after medications, CT scan shows no definite acute abnormalities.  Patient is amenable to discharge, no acute neurologic findings, patient expresses understanding of indications for return  Vida Roller, MD 02/08/12 [phone]

## 2012-02-08 NOTE — ED Notes (Signed)
Patient with headache since last night, unable to sleep.  Patient started a new medication yesterday :Dicyclomine.  Patient denies any photophobia or noise sensitivity.  Patient has been nauseated and vomitted x1.

## 2012-02-08 NOTE — Discharge Instructions (Signed)
Your CT scan is normal, please take the medications as prescribed and return to see your doctor within 24 hours if you are still having symptoms. Return to the hospital for severe or worsening pain nausea vomiting stiff neck or fever. He should also return to the hospital if he had a change in her vision.

## 2012-02-08 NOTE — ED Notes (Signed)
Patient complains or headache to right forehead that was present when she went to bed last night. Per patient she tried Tylenol with no relief. Patient states she had nausea earlier but it is now gone. Patient denies any blurred vision but states she is light sensitive.

## 2012-02-11 ENCOUNTER — Ambulatory Visit (INDEPENDENT_AMBULATORY_CARE_PROVIDER_SITE_OTHER): Payer: Medicare Other | Admitting: Internal Medicine

## 2012-02-11 ENCOUNTER — Encounter: Payer: Self-pay | Admitting: Internal Medicine

## 2012-02-11 VITALS — BP 138/100 | HR 60 | Temp 97.7°F | Resp 16 | Wt 172.5 lb

## 2012-02-11 DIAGNOSIS — T887XXA Unspecified adverse effect of drug or medicament, initial encounter: Secondary | ICD-10-CM

## 2012-02-11 DIAGNOSIS — M76899 Other specified enthesopathies of unspecified lower limb, excluding foot: Secondary | ICD-10-CM

## 2012-02-11 DIAGNOSIS — T50905A Adverse effect of unspecified drugs, medicaments and biological substances, initial encounter: Secondary | ICD-10-CM

## 2012-02-11 NOTE — Assessment & Plan Note (Signed)
May continue on meloxicam- Rx renewed

## 2012-02-11 NOTE — Progress Notes (Signed)
  Subjective:    Patient ID: Alyssa Lindsey, female    DOB: 1928/12/26, 78 y.o.   MRN: [id number]  HPI Patient was recently seen by Dr. Jarold Motto who started Bentyl. She developed HA and severe eye pain after taking Bentyl and went to the [facility] ED March 15th.. Notes and CT scan reviewed. Patient had normal eye pressure of 20. She was sent home on naprosyn although she was already taking meloxicam. Her symptoms resolved and she is feeling fine. Reveiwed Dr. Norval Gable note. Reviewed all her medications with her.  PMH, FamHx and SocHx reviewed for any changes and relevance.    Review of Systems System review is negative for any constitutional, cardiac, pulmonary, GI or neuro symptoms or complaints other than as described in the HPI.     Objective:   Physical Exam Filed Vitals:   02/11/12 1609  BP: 138/100  Pulse: 60  Temp: 97.7 F (36.5 C)  Resp: 16   Weight: 172 lb 8 oz (78.245 kg)  Gen'l- WNWD AA woman who looks younger than her age HEENT- C&S clear, EOMI, PERRLA Cor- RRR Pulm - normal respirations Neuro - A&O x 3, CN II-XII normal, cerebellar function normal       Assessment & Plan:  Adverse drug reaction - cycloplegia with pain after taking  Bentyl.  Plan - D/C bentyl

## 2012-02-11 NOTE — Patient Instructions (Signed)
Eye pain was a side effect of Bentyl - do not take this any more.  For hip and neck pain - ok to continue to take the meloxicam - refill sent in. DO NOT TAKE BOTH MELOXICAM AND NAPROSYN they are the same class of drug.   Continue all your other medications.

## 2012-02-16 ENCOUNTER — Other Ambulatory Visit: Payer: Self-pay | Admitting: Internal Medicine

## 2012-02-21 ENCOUNTER — Other Ambulatory Visit: Payer: Self-pay | Admitting: Internal Medicine

## 2012-02-21 NOTE — Telephone Encounter (Signed)
Alprazolam request [last refill 01.13.12 #[id number]]

## 2012-02-25 ENCOUNTER — Other Ambulatory Visit: Payer: Self-pay | Admitting: *Deleted

## 2012-02-25 NOTE — Telephone Encounter (Signed)
Faxed Alprazolam Rx to pharmacy/[facility] 

## 2012-03-05 ENCOUNTER — Ambulatory Visit: Payer: Medicare Other | Admitting: Internal Medicine

## 2012-03-06 ENCOUNTER — Encounter: Payer: Self-pay | Admitting: Internal Medicine

## 2012-03-06 ENCOUNTER — Ambulatory Visit (INDEPENDENT_AMBULATORY_CARE_PROVIDER_SITE_OTHER): Payer: Medicare Other | Admitting: Internal Medicine

## 2012-03-06 VITALS — BP 132/80 | HR 53 | Temp 98.0°F | Ht 65.0 in | Wt 170.5 lb

## 2012-03-06 DIAGNOSIS — M5430 Sciatica, unspecified side: Secondary | ICD-10-CM

## 2012-03-06 DIAGNOSIS — M5441 Lumbago with sciatica, right side: Secondary | ICD-10-CM

## 2012-03-06 DIAGNOSIS — E119 Type 2 diabetes mellitus without complications: Secondary | ICD-10-CM

## 2012-03-06 DIAGNOSIS — I10 Essential (primary) hypertension: Secondary | ICD-10-CM

## 2012-03-06 MED ORDER — TRAMADOL HCL 50 MG PO TABS: 50.0000 mg | ORAL_TABLET | Freq: Four times a day (QID) | ORAL | Status: AC | PRN

## 2012-03-06 MED ORDER — CYCLOBENZAPRINE HCL 5 MG PO TABS: 5.0000 mg | ORAL_TABLET | Freq: Three times a day (TID) | ORAL | Status: DC | PRN

## 2012-03-06 MED ORDER — PREDNISONE 10 MG PO TABS: 10.0000 mg | ORAL_TABLET | Freq: Every day | ORAL | Status: DC

## 2012-03-06 NOTE — Patient Instructions (Signed)
Take all new medications as prescribed Continue all other medications as before Please return if your pain, numbness or any weakness persists or gets worse

## 2012-03-08 ENCOUNTER — Encounter: Payer: Self-pay | Admitting: Internal Medicine

## 2012-03-08 NOTE — Assessment & Plan Note (Signed)
stable overall by hx and exam, most recent data reviewed with pt, and pt to continue medical treatment as before  BP Readings from Last 3 Encounters:  03/06/12 132/80  02/11/12 138/100  02/08/12 146/73

## 2012-03-08 NOTE — Assessment & Plan Note (Signed)
stable overall by hx and exam, most recent data reviewed with pt, and pt to continue medical treatment as before, monitor cbg's on predpack tx , call for > 200 or onset polys

## 2012-03-08 NOTE — Assessment & Plan Note (Signed)
Etiology unclear, suspect likely underlying lumbar DJD/DDD with pain referral, for pain control, predpack trial, consider surgical referral,  to f/u any worsening symptoms or concerns

## 2012-03-08 NOTE — Progress Notes (Signed)
  Subjective:    Patient ID: Alyssa Lindsey, female    DOB: Aug 24, 1929, 78 y.o.   MRN: [id number]  HPI  Here with acute visit;  Pt is right handed, now c/o 5 days mod sharp right LBP with right buttock pain and soreness, but no bowel or bladder change, fever, wt loss,  worsening LE pain/numbness/weakness, gait change or falls. Nothing seems to make better or worse.  overall doing ok,  Pt denies chest pain, increased sob or doe, wheezing, orthopnea, PND, increased LE swelling, palpitations, dizziness or syncope.   Pt denies polydipsia, polyuria, or low sugar symptoms such as weakness or confusion improved with po intake.  Pt states overall good compliance with meds, trying to follow lower cholesterol, diabetic diet, wt overall stable. Past Medical History  Diagnosis Date  . Personal history of colonic polyps   . Type II or unspecified type diabetes mellitus without mention of complication, not stated as uncontrolled   . HTN (hypertension)   . LBP (low back pain)   . PVD (peripheral vascular disease)   . Chronic neck pain   . Spondylosis   . Diverticulosis   . Arthritis   . Diverticulosis of colon (without mention of hemorrhage)   . Hiatal hernia   . Gastritis   . Family history of malignant neoplasm of gastrointestinal tract   . Memory problem    Past Surgical History  Procedure Date  . Tubal ligation     reports that she has never smoked. She has never used smokeless tobacco. She reports that she does not drink alcohol or use illicit drugs. family history includes Breast cancer in her sisters; Colon cancer in her daughter; Diabetes in her daughter; Emphysema in her brother; Liver disease in her brother; Lung cancer in her daughter; Ovarian cancer in her sister; Stroke (age of onset:62) in her father; and Throat cancer in her brother. Allergies  Allergen Reactions  . Bentyl Other (See Comments)    Severe eye pain  . Aspirin Other (See Comments)    Unknown reaction   Review of  Systems Review of Systems  Constitutional: Negative for diaphoresis and unexpected weight change.  Genitourinary: Negative for hematuria and decreased urine volume.  Musculoskeletal: Negative for gait problem.  Skin: Negative for color change and wound.  Neurological: Negative for tremors and numbness.  Psychiatric/Behavioral: Negative for decreased concentration. The patient is not hyperactive.      Objective:   Physical Exam BP 132/80  Pulse 53  Temp(Src) 98 F (36.7 C) (Oral)  Ht 5\' 5"  (1.651 m)  Wt 170 lb 8 oz (77.338 kg)  BMI 28.37 kg/m2  SpO2 97% Physical Exam  VS noted, not ill appearing Constitutional: Pt appears well-developed and well-nourished.  Eyes: Conjunctivae and EOM are normal. Pupils are equal, round, and reactive to light.  Neck: Normal range of motion. Neck supple.  Cardiovascular: Normal rate and regular rhythm.   Pulmonary/Chest: Effort normal and breath sounds normal.  Abd:  Soft, NT, non-distended, + BS Spine nontender, has tender though to right buttock SI joint area Neurological: Pt is alert. No cranial nerve deficit. LE motor/sens/dtr/gait intact, slr neg on right Skin: Skin is warm. No erythema.  Psychiatric: Pt behavior is normal. Thought content normal.     Assessment & Plan:

## 2012-03-17 ENCOUNTER — Telehealth: Payer: Self-pay | Admitting: Internal Medicine

## 2012-03-17 NOTE — Telephone Encounter (Signed)
May add to Tuesday schedule

## 2012-03-17 NOTE — Telephone Encounter (Signed)
The pt called and is hoping to be worked in for an appointment to treat her back pain.  She states the pain is radiating down to her backside and legs.  Do you want her added?   Thanks!

## 2012-03-18 ENCOUNTER — Encounter: Payer: Self-pay | Admitting: Internal Medicine

## 2012-03-18 ENCOUNTER — Ambulatory Visit (INDEPENDENT_AMBULATORY_CARE_PROVIDER_SITE_OTHER): Payer: Medicare Other | Admitting: Internal Medicine

## 2012-03-18 VITALS — BP 160/80 | HR 67 | Temp 98.6°F | Resp 16 | Wt 173.0 lb

## 2012-03-18 DIAGNOSIS — M5441 Lumbago with sciatica, right side: Secondary | ICD-10-CM

## 2012-03-18 DIAGNOSIS — M5430 Sciatica, unspecified side: Secondary | ICD-10-CM

## 2012-03-18 MED ORDER — PREDNISONE 10 MG PO TABS: 10.0000 mg | ORAL_TABLET | Freq: Every day | ORAL | Status: DC

## 2012-03-18 NOTE — Patient Instructions (Signed)
Low back pain - most tender at the sacroiliac joints and in the big muscles - gluteus maximus. No evidence of a pinched nerve or any serious neurosurgical problem.   Plan - stop the naprosyn           Take prednisone as directed for 13 days.           Stretch exercises - go to YouTube.com using the search bar enter sacroiliac and stretch; enter buttock pain and stretch. You will get a lot of videos              You can review that will instruct you in stretching exercises.

## 2012-03-20 NOTE — Progress Notes (Signed)
Subjective:    Patient ID: Alyssa Lindsey, female    DOB: December 04, 1928, 78 y.o.   MRN: [id number]  HPI Alyssa Lindsey presents for evaluation of back pain. For the past 7-10 days she has had pain in the area of the buttock with some radiation of pain to both proximal LE. She denies any strain or injury. She denies any paresthesia, motor weakness, incontinence of bowel or bladder, falls or inability to do her ADLs.  Past Medical History  Diagnosis Date  . Personal history of colonic polyps   . Type II or unspecified type diabetes mellitus without mention of complication, not stated as uncontrolled   . HTN (hypertension)   . LBP (low back pain)   . PVD (peripheral vascular disease)   . Chronic neck pain   . Spondylosis   . Diverticulosis   . Arthritis   . Diverticulosis of colon (without mention of hemorrhage)   . Hiatal hernia   . Gastritis   . Family history of malignant neoplasm of gastrointestinal tract   . Memory problem    Past Surgical History  Procedure Date  . Tubal ligation    Family History  Problem Relation Age of Onset  . Stroke Father 57  . Ovarian cancer Sister   . Breast cancer Sister   . Breast cancer Sister   . Liver disease Brother   . Colon cancer Daughter   . Diabetes Daughter   . Throat cancer Brother   . Emphysema Brother   . Lung cancer Daughter    History   Social History  . Marital Status: Married    Spouse Name: N/A    Number of Children: 4  . Years of Education: N/A   Occupational History  . Retired Advertising copywriter    Social History Main Topics  . Smoking status: Never Smoker   . Smokeless tobacco: Never Used  . Alcohol Use: No  . Drug Use: No  . Sexually Active: Not on file   Other Topics Concern  . Not on file   Social History Narrative   Married '524 children: 1 son '59; 3 dtrs '53, '62, '64; 9 grandchildren; 4-5 great-grandLives with SO; dtr at home, grandson at homeDaily Caffeine Use:  1 cupRegular Exercise -  NO    Current  Outpatient Prescriptions on File Prior to Visit  Medication Sig Dispense Refill  . ALPRAZolam (XANAX) 0.5 MG tablet take 1 tablet by mouth every 6 hours if needed  100 tablet  5  . amitriptyline (ELAVIL) 25 MG tablet Take 1 tablet (25 mg total) by mouth at bedtime.  30 tablet  11  . famotidine (PEPCID) 20 MG tablet Take 20 mg by mouth 2 (two) times daily as needed. For acid reflux      . lisinopril-hydrochlorothiazide (PRINZIDE,ZESTORETIC) 20-25 MG per tablet Take 1 tablet by mouth daily.  90 tablet  1  . meloxicam (MOBIC) 15 MG tablet Take 1 tablet (15 mg total) by mouth daily.  30 tablet  5  . metFORMIN (GLUCOPHAGE) 500 MG tablet take 2 tablets by mouth once daily  60 tablet  2  . naproxen (NAPROSYN) 500 MG tablet Take 1 tablet (500 mg total) by mouth 2 (two) times daily with a meal.  30 tablet  0  . predniSONE (DELTASONE) 10 MG tablet Take 1 tablet (10 mg total) by mouth daily. 3 tabs by mouth per day for 3 days,2tabs per day for 3 days,1tab per day for 3 days  18 tablet  0      Review of Systems System review is negative for any constitutional, cardiac, pulmonary, GI or neuro symptoms or complaints other than as described in the HPI.     Objective:   Physical Exam Filed Vitals:   03/18/12 1554  BP: 160/80  Pulse: 67  Temp: 98.6 F (37 C)  Resp: 16   Wt Readings from Last 3 Encounters:  03/18/12 173 lb (78.472 kg)  03/06/12 170 lb 8 oz (77.338 kg)  02/11/12 172 lb 8 oz (78.245 kg)   Gen'l- heavyset AA woman in no distress HEENT - C&S clear, EOMI Cor- RRR Pulm - normal respirations. Ext - Back exam: normal stand; normal flex to greater than 100 degrees; normal gait; normal toe/heel walk; normal step up to exam table; normal SLR sitting; normal DTRs at the patellar tendons; normal sensation to light touch, pin-prick and deep vibratory stimulus; no  CVA tenderness; tender to palpation over SI joints; able to move supine to sitting witout assistance.        Assessment &  Plan:  Low back pain - no radicular findings on exam. Suspect inflammation of the SI joints and possible mild piriformis syndrome type pain.  Plan - steroid burst and taper           Stretching exercise - referred to [employer].com for instructional videos.

## 2012-03-21 ENCOUNTER — Encounter: Payer: Self-pay | Admitting: Internal Medicine

## 2012-03-21 ENCOUNTER — Ambulatory Visit (INDEPENDENT_AMBULATORY_CARE_PROVIDER_SITE_OTHER): Payer: Medicare Other | Admitting: Internal Medicine

## 2012-03-21 ENCOUNTER — Other Ambulatory Visit (INDEPENDENT_AMBULATORY_CARE_PROVIDER_SITE_OTHER): Payer: Medicare Other

## 2012-03-21 VITALS — BP 156/78 | HR 74 | Temp 98.3°F | Resp 16

## 2012-03-21 DIAGNOSIS — I10 Essential (primary) hypertension: Secondary | ICD-10-CM

## 2012-03-21 DIAGNOSIS — E1149 Type 2 diabetes mellitus with other diabetic neurological complication: Secondary | ICD-10-CM

## 2012-03-21 DIAGNOSIS — M25559 Pain in unspecified hip: Secondary | ICD-10-CM

## 2012-03-21 DIAGNOSIS — E1142 Type 2 diabetes mellitus with diabetic polyneuropathy: Secondary | ICD-10-CM

## 2012-03-21 DIAGNOSIS — R1032 Left lower quadrant pain: Secondary | ICD-10-CM

## 2012-03-21 DIAGNOSIS — M545 Low back pain, unspecified: Secondary | ICD-10-CM

## 2012-03-21 DIAGNOSIS — M25552 Pain in left hip: Secondary | ICD-10-CM | POA: Insufficient documentation

## 2012-03-21 DIAGNOSIS — G8929 Other chronic pain: Secondary | ICD-10-CM

## 2012-03-21 DIAGNOSIS — M25551 Pain in right hip: Secondary | ICD-10-CM | POA: Insufficient documentation

## 2012-03-21 LAB — CBC WITH DIFFERENTIAL/PLATELET
Basophils Relative: 0.1 % (ref 0.0–3.0)
Eosinophils Absolute: 0.1 10*3/uL (ref 0.0–0.7)
Eosinophils Relative: 1.8 % (ref 0.0–5.0)
Hemoglobin: 12.3 g/dL (ref 12.0–15.0)
Lymphocytes Relative: 13.1 % (ref 12.0–46.0)
MCHC: 31.6 g/dL (ref 30.0–36.0)
Monocytes Relative: 3.9 % (ref 3.0–12.0)
Neutro Abs: 4.5 10*3/uL (ref 1.4–7.7)
RBC: 4.63 Mil/uL (ref 3.87–5.11)
WBC: 5.5 10*3/uL (ref 4.5–10.5)

## 2012-03-21 LAB — COMPREHENSIVE METABOLIC PANEL
AST: 16 U/L (ref 0–37)
Alkaline Phosphatase: 77 U/L (ref 39–117)
BUN: 14 mg/dL (ref 6–23)
Calcium: 9.3 mg/dL (ref 8.4–10.5)
Chloride: 107 mEq/L (ref 96–112)
Creatinine, Ser: 1.1 mg/dL (ref 0.4–1.2)
Glucose, Bld: 127 mg/dL — ABNORMAL HIGH (ref 70–99)

## 2012-03-21 LAB — URINALYSIS, ROUTINE W REFLEX MICROSCOPIC
Ketones, ur: NEGATIVE
Specific Gravity, Urine: >=1.03 (ref 1.000–1.030)
Total Protein, Urine: NEGATIVE
Urine Glucose: NEGATIVE
pH: 6 (ref 5.0–8.0)

## 2012-03-21 LAB — HEMOGLOBIN A1C: Hgb A1c MFr Bld: 6.5 % (ref 4.6–6.5)

## 2012-03-21 MED ORDER — OLMESARTAN-AMLODIPINE-HCTZ 40-5-12.5 MG PO TABS: 1.0000 | ORAL_TABLET | Freq: Every day | ORAL | Status: DC

## 2012-03-21 MED ORDER — HYDROCODONE-ACETAMINOPHEN 5-500 MG PO TABS: 2.0000 | ORAL_TABLET | Freq: Four times a day (QID) | ORAL | Status: AC | PRN

## 2012-03-21 NOTE — Assessment & Plan Note (Signed)
She is high risk for osteoporotic fracture so I have asked her to hane plain xrays done today and I will stop the nsaids and prednisone since they have not helped and for now she will try vicodin for pain

## 2012-03-21 NOTE — Assessment & Plan Note (Signed)
Her BP is not well controlled so I have changed her med to tribenzor, I will check her lytes and renal function today

## 2012-03-21 NOTE — Assessment & Plan Note (Signed)
I will check her a1c and since she appears to have been taking 2 nsaids and prednisone I will check her renal function as well

## 2012-03-21 NOTE — Assessment & Plan Note (Signed)
I will check plain films for spurring, djd, occult fracture, etc.

## 2012-03-21 NOTE — Progress Notes (Signed)
Subjective:    Patient ID: Alyssa Lindsey, female    DOB: August 07, 1929, 78 y.o.   MRN: [id number]  Back Pain This is a recurrent problem. The current episode started more than 1 month ago. The problem occurs constantly. The problem has been gradually worsening since onset. The pain is present in the lumbar spine. The quality of the pain is described as aching and shooting. The pain radiates to the left thigh and right thigh. The pain is at a severity of 4/10. The pain is moderate. The pain is worse during the day. The symptoms are aggravated by bending. Associated symptoms include abdominal pain (chronic in the LLQ) and leg pain. Pertinent negatives include no bladder incontinence, bowel incontinence, chest pain, dysuria, fever, headaches, numbness, paresis, paresthesias, pelvic pain, perianal numbness, tingling, weakness or weight loss. Risk factors include lack of exercise and obesity. She has tried NSAIDs for the symptoms. The treatment provided no relief.  Hypertension This is a chronic problem. The current episode started more than 1 year ago. The problem has been gradually worsening since onset. The problem is uncontrolled. Pertinent negatives include no anxiety, blurred vision, chest pain, headaches, malaise/fatigue, neck pain, orthopnea, palpitations, peripheral edema, PND, shortness of breath or sweats. Agents associated with hypertension include NSAIDs and steroids. Past treatments include ACE inhibitors and diuretics. The current treatment provides mild improvement. Compliance problems include exercise and diet.       Review of Systems  Constitutional: Negative for fever, chills, weight loss, malaise/fatigue, diaphoresis, activity change, appetite change, fatigue and unexpected weight change.  HENT: Negative.  Negative for neck pain.   Eyes: Negative.  Negative for blurred vision.  Respiratory: Negative for cough, chest tightness, shortness of breath, wheezing and stridor.     Cardiovascular: Negative for chest pain, palpitations, orthopnea and PND.  Gastrointestinal: Positive for abdominal pain (chronic in the LLQ). Negative for bowel incontinence.  Genitourinary: Negative.  Negative for bladder incontinence, dysuria, urgency, frequency, hematuria, flank pain, enuresis, difficulty urinating, pelvic pain and dyspareunia.  Musculoskeletal: Positive for back pain and arthralgias (pain in both hips and buttocks areas). Negative for myalgias, joint swelling and gait problem.  Neurological: Negative for dizziness, tingling, tremors, seizures, syncope, facial asymmetry, speech difficulty, weakness, light-headedness, numbness, headaches and paresthesias.  Hematological: Negative for adenopathy. Does not bruise/bleed easily.  Psychiatric/Behavioral: Negative.        Objective:   Physical Exam  Vitals reviewed. Constitutional: She is oriented to person, place, and time. She appears well-developed and well-nourished. No distress.  HENT:  Head: Normocephalic and atraumatic.  Mouth/Throat: Oropharynx is clear and moist. No oropharyngeal exudate.  Eyes: Conjunctivae are normal. Right eye exhibits no discharge. Left eye exhibits no discharge. No scleral icterus.  Neck: Normal range of motion. Neck supple. No JVD present. No tracheal deviation present. No thyromegaly present.  Cardiovascular: Normal rate, regular rhythm, normal heart sounds and intact distal pulses.  Exam reveals no gallop and no friction rub.   No murmur heard. Pulmonary/Chest: Effort normal and breath sounds normal. No stridor. No respiratory distress. She has no wheezes. She has no rales. She exhibits no tenderness.  Abdominal: Soft. Bowel sounds are normal. She exhibits no distension and no mass. There is no tenderness. There is no rebound and no guarding.  Musculoskeletal: Normal range of motion. She exhibits no edema and no tenderness.       Right hip: Normal. She exhibits normal range of motion, normal  strength, no tenderness, no bony tenderness, no swelling, no crepitus, no  deformity and no laceration.       Left hip: Normal. She exhibits normal range of motion, normal strength, no tenderness, no bony tenderness, no swelling, no crepitus, no deformity and no laceration.       Lumbar back: Normal. She exhibits normal range of motion, no tenderness, no bony tenderness, no swelling, no edema, no deformity, no laceration, no pain and no spasm.  Lymphadenopathy:    She has no cervical adenopathy.  Neurological: She is alert and oriented to person, place, and time. She displays no atrophy, no tremor and normal reflexes. No cranial nerve deficit or sensory deficit. She exhibits normal muscle tone. She displays no seizure activity. Coordination and gait normal.  Reflex Scores:      Tricep reflexes are 0 on the right side and 0 on the left side.      Bicep reflexes are 0 on the right side and 0 on the left side.      Brachioradialis reflexes are 0 on the right side and 0 on the left side.      Patellar reflexes are 0 on the right side and 0 on the left side.      Achilles reflexes are 0 on the right side and 0 on the left side.      - SLR in BLE  Skin: Skin is warm and dry. No rash noted. She is not diaphoretic. No erythema. No pallor.  Psychiatric: She has a normal mood and affect. Her behavior is normal. Judgment and thought content normal.      Lab Results  Component Value Date   WBC 4.8 02/07/2012   HGB 12.0 02/07/2012   HCT 37.9 02/07/2012   PLT 189.0 02/07/2012   GLUCOSE 108* 01/10/2012   CHOL 164 01/10/2012   TRIG 131.0 01/10/2012   HDL 50.00 01/10/2012   LDLCALC 88 01/10/2012   ALT 14 01/10/2012   AST 21 01/10/2012   NA 143 01/10/2012   K 3.5 01/10/2012   CL 108 01/10/2012   CREATININE 1.1 01/10/2012   BUN 13 01/10/2012   CO2 30 01/10/2012   TSH 2.07 03/02/2010   INR 0.91 12/24/2009   HGBA1C 6.7* 01/10/2012   MICROALBUR 4.3* 02/27/2008      Assessment & Plan:

## 2012-03-21 NOTE — Patient Instructions (Signed)
Back Pain, Adult Low back pain is very common. About 1 in 5 people have back pain.The cause of low back pain is rarely dangerous. The pain often gets better over time.About half of people with a sudden onset of back pain feel better in just 2 weeks. About 8 in 10 people feel better by 6 weeks.  CAUSES Some common causes of back pain include:  Strain of the muscles or ligaments supporting the spine.   Wear and tear (degeneration) of the spinal discs.   Arthritis.   Direct injury to the back.  DIAGNOSIS Most of the time, the direct cause of low back pain is not known.However, back pain can be treated effectively even when the exact cause of the pain is unknown.Answering your caregiver's questions about your overall health and symptoms is one of the most accurate ways to make sure the cause of your pain is not dangerous. If your caregiver needs more information, he or she may order lab work or imaging tests (X-rays or MRIs).However, even if imaging tests show changes in your back, this usually does not require surgery. HOME CARE INSTRUCTIONS For many people, back pain returns.Since low back pain is rarely dangerous, it is often a condition that people can learn to manageon their own.   Remain active. It is stressful on the back to sit or stand in one place. Do not sit, drive, or stand in one place for more than 30 minutes at a time. Take short walks on level surfaces as soon as pain allows.Try to increase the length of time you walk each day.   Do not stay in bed.Resting more than 1 or 2 days can delay your recovery.   Do not avoid exercise or work.Your body is made to move.It is not dangerous to be active, even though your back may hurt.Your back will likely heal faster if you return to being active before your pain is gone.   Pay attention to your body when you bend and lift. Many people have less discomfortwhen lifting if they bend their knees, keep the load close to their  bodies,and avoid twisting. Often, the most comfortable positions are those that put less stress on your recovering back.   Find a comfortable position to sleep. Use a firm mattress and lie on your side with your knees slightly bent. If you lie on your back, put a pillow under your knees.   Only take over-the-counter or prescription medicines as directed by your caregiver. Over-the-counter medicines to reduce pain and inflammation are often the most helpful.Your caregiver may prescribe muscle relaxant drugs.These medicines help dull your pain so you can more quickly return to your normal activities and healthy exercise.   Put ice on the injured area.   Put ice in a plastic bag.   Place a towel between your skin and the bag.   Leave the ice on for 15 to 20 minutes, 3 to 4 times a day for the first 2 to 3 days. After that, ice and heat may be alternated to reduce pain and spasms.   Ask your caregiver about trying back exercises and gentle massage. This may be of some benefit.   Avoid feeling anxious or stressed.Stress increases muscle tension and can worsen back pain.It is important to recognize when you are anxious or stressed and learn ways to manage it.Exercise is a great option.  SEEK MEDICAL CARE IF:  You have pain that is not relieved with rest or medicine.   You have   pain that does not improve in 1 week.   You have new symptoms.   You are generally not feeling well.  SEEK IMMEDIATE MEDICAL CARE IF:   You have pain that radiates from your back into your legs.   You develop new bowel or bladder control problems.   You have unusual weakness or numbness in your arms or legs.   You develop nausea or vomiting.   You develop abdominal pain.   You feel faint.  Document Released: 11/12/2005 Document Revised: 11/01/2011 Document Reviewed: 04/02/2011 [employer] Patient Information 2012 [employer].Hypertension As your heart beats, it forces blood through your arteries. This  force is your blood pressure. If the pressure is too high, it is called hypertension (HTN) or high blood pressure. HTN is dangerous because you may have it and not know it. High blood pressure may mean that your heart has to work harder to pump blood. Your arteries may be narrow or stiff. The extra work puts you at risk for heart disease, stroke, and other problems.  Blood pressure consists of two numbers, a higher number over a lower, 110/72, for example. It is stated as "110 over 72." The ideal is below 120 for the top number (systolic) and under 80 for the bottom (diastolic). Write down your blood pressure today. You should pay close attention to your blood pressure if you have certain conditions such as:  Heart failure.   Prior heart attack.   Diabetes   Chronic kidney disease.   Prior stroke.   Multiple risk factors for heart disease.  To see if you have HTN, your blood pressure should be measured while you are seated with your arm held at the level of the heart. It should be measured at least twice. A one-time elevated blood pressure reading (especially in the Emergency Department) does not mean that you need treatment. There may be conditions in which the blood pressure is different between your right and left arms. It is important to see your caregiver soon for a recheck. Most people have essential hypertension which means that there is not a specific cause. This type of high blood pressure may be lowered by changing lifestyle factors such as:  Stress.   Smoking.   Lack of exercise.   Excessive weight.   Drug/tobacco/alcohol use.   Eating less salt.  Most people do not have symptoms from high blood pressure until it has caused damage to the body. Effective treatment can often prevent, delay or reduce that damage. TREATMENT  When a cause has been identified, treatment for high blood pressure is directed at the cause. There are a large number of medications to treat HTN. These  fall into several categories, and your caregiver will help you select the medicines that are best for you. Medications may have side effects. You should review side effects with your caregiver. If your blood pressure stays high after you have made lifestyle changes or started on medicines,   Your medication(s) may need to be changed.   Other problems may need to be addressed.   Be certain you understand your prescriptions, and know how and when to take your medicine.   Be sure to follow up with your caregiver within the time frame advised (usually within two weeks) to have your blood pressure rechecked and to review your medications.   If you are taking more than one medicine to lower your blood pressure, make sure you know how and at what times they should be taken. Taking   two medicines at the same time can result in blood pressure that is too low.  SEEK IMMEDIATE MEDICAL CARE IF:  You develop a severe headache, blurred or changing vision, or confusion.   You have unusual weakness or numbness, or a faint feeling.   You have severe chest or abdominal pain, vomiting, or breathing problems.  MAKE SURE YOU:   Understand these instructions.   Will watch your condition.   Will get help right away if you are not doing well or get worse.  Document Released: 11/12/2005 Document Revised: 11/01/2011 Document Reviewed: 07/02/2008 Exit[employer] Patient Information 2012 [employer]. 

## 2012-04-29 ENCOUNTER — Telehealth: Payer: Self-pay | Admitting: Internal Medicine

## 2012-04-29 NOTE — Telephone Encounter (Signed)
K

## 2012-04-29 NOTE — Telephone Encounter (Signed)
Pt is having pain when she sits for a couple of months.  It is getting worse.  Has been seen by Dr. Yetta Barre for this, but is not better.  Would like to be worked in this week.

## 2012-05-01 ENCOUNTER — Other Ambulatory Visit (INDEPENDENT_AMBULATORY_CARE_PROVIDER_SITE_OTHER): Payer: Medicare Other

## 2012-05-01 ENCOUNTER — Ambulatory Visit (INDEPENDENT_AMBULATORY_CARE_PROVIDER_SITE_OTHER): Payer: Medicare Other | Admitting: Endocrinology

## 2012-05-01 ENCOUNTER — Encounter: Payer: Self-pay | Admitting: Endocrinology

## 2012-05-01 ENCOUNTER — Ambulatory Visit (INDEPENDENT_AMBULATORY_CARE_PROVIDER_SITE_OTHER)
Admission: RE | Admit: 2012-05-01 | Discharge: 2012-05-01 | Disposition: A | Discharge status: Home / Self Care | Payer: Medicare Other | Source: Ambulatory Visit | Attending: Endocrinology | Admitting: Endocrinology

## 2012-05-01 VITALS — BP 140/88 | HR 67 | Temp 98.3°F | Ht 65.0 in | Wt 170.0 lb

## 2012-05-01 DIAGNOSIS — N949 Unspecified condition associated with female genital organs and menstrual cycle: Secondary | ICD-10-CM

## 2012-05-01 DIAGNOSIS — R102 Pelvic and perineal pain: Secondary | ICD-10-CM

## 2012-05-01 DIAGNOSIS — I10 Essential (primary) hypertension: Secondary | ICD-10-CM

## 2012-05-01 LAB — SEDIMENTATION RATE: Sed Rate: 15 mm/hr (ref 0–22)

## 2012-05-01 MED ORDER — TRAMADOL HCL 50 MG PO TABS: 50.0000 mg | ORAL_TABLET | Freq: Four times a day (QID) | ORAL | Status: AC | PRN

## 2012-05-01 NOTE — Progress Notes (Signed)
Subjective:    Patient ID: Alyssa Lindsey, female    DOB: 1929-09-19, 78 y.o.   MRN: [id number]  HPI Pt states 1 week of moderate pain at both buttocks, but no assoc numbness.  She is unable to cite precip factor.   Past Medical History  Diagnosis Date  . Personal history of colonic polyps   . Type II or unspecified type diabetes mellitus without mention of complication, not stated as uncontrolled   . HTN (hypertension)   . LBP (low back pain)   . PVD (peripheral vascular disease)   . Chronic neck pain   . Spondylosis   . Diverticulosis   . Arthritis   . Diverticulosis of colon (without mention of hemorrhage)   . Hiatal hernia   . Gastritis   . Family history of malignant neoplasm of gastrointestinal tract   . Memory problem     Past Surgical History  Procedure Date  . Tubal ligation     History   Social History  . Marital Status: Married    Spouse Name: N/A    Number of Children: 4  . Years of Education: N/A   Occupational History  . Retired Advertising copywriter    Social History Main Topics  . Smoking status: Never Smoker   . Smokeless tobacco: Never Used  . Alcohol Use: No  . Drug Use: No  . Sexually Active: Not on file   Other Topics Concern  . Not on file   Social History Narrative   Married '524 children: 1 son '59; 3 dtrs '53, '62, '64; 9 grandchildren; 4-5 great-grandLives with SO; dtr at home, grandson at homeDaily Caffeine Use:  1 cupRegular Exercise -  NO    Current Outpatient Prescriptions on File Prior to Visit  Medication Sig Dispense Refill  . ALPRAZolam (XANAX) 0.5 MG tablet take 1 tablet by mouth every 6 hours if needed  100 tablet  5  . amitriptyline (ELAVIL) 25 MG tablet Take 1 tablet (25 mg total) by mouth at bedtime.  30 tablet  11  . famotidine (PEPCID) 20 MG tablet Take 20 mg by mouth 2 (two) times daily as needed. For acid reflux      . metFORMIN (GLUCOPHAGE) 500 MG tablet take 2 tablets by mouth once daily  60 tablet  2  .  Olmesartan-Amlodipine-HCTZ (TRIBENZOR) 40-5-12.5 MG TABS Take 1 tablet by mouth daily.  56 tablet  0    Allergies  Allergen Reactions  . Dicyclomine Hcl Other (See Comments)    Severe eye pain  . Aspirin Other (See Comments)    Unknown reaction    Family History  Problem Relation Age of Onset  . Stroke Father 57  . Ovarian cancer Sister   . Breast cancer Sister   . Breast cancer Sister   . Liver disease Brother   . Colon cancer Daughter   . Diabetes Daughter   . Throat cancer Brother   . Emphysema Brother   . Lung cancer Daughter     BP 140/88  Pulse 67  Temp 98.3 F (36.8 C) (Oral)  Ht 5\' 5"  (1.651 m)  Wt 170 lb (77.111 kg)  BMI 28.29 kg/m2  SpO2 97%   Review of Systems Denies other arthralgias or myalgias.      Objective:   Physical Exam VITAL SIGNS:  See vs page GENERAL: no distress Buttocks: nontender.   Gait: normal and steady, but painful.   ESR=normal    Assessment & Plan:  Pelvic pain, new.  uncertain etiology HTN, with probable situational component

## 2012-05-01 NOTE — Patient Instructions (Addendum)
blood tests and x-rays are being requested for you today.  You will receive a letter with results. i have sent a prescription to your pharmacy, for a pain pill. I hope you feel better soon.  If you don't feel better by next week, please call back.

## 2012-05-01 NOTE — Telephone Encounter (Signed)
LMOM on Wed. To call for appt.  She saw Dr. Everardo All today.

## 2012-05-02 ENCOUNTER — Telehealth: Payer: Self-pay | Admitting: *Deleted

## 2012-05-02 ENCOUNTER — Ambulatory Visit: Payer: Medicare Other | Admitting: Internal Medicine

## 2012-05-02 NOTE — Telephone Encounter (Signed)
Called pt to inform of lab results, pt informed of lab and xray results (letter also mailed to pt).

## 2012-05-19 ENCOUNTER — Encounter: Payer: Self-pay | Admitting: Internal Medicine

## 2012-05-19 ENCOUNTER — Ambulatory Visit (INDEPENDENT_AMBULATORY_CARE_PROVIDER_SITE_OTHER): Payer: Medicare Other | Admitting: Internal Medicine

## 2012-05-19 VITALS — BP 144/88 | HR 70 | Temp 98.4°F | Resp 14 | Wt 164.8 lb

## 2012-05-19 DIAGNOSIS — R102 Pelvic and perineal pain: Secondary | ICD-10-CM

## 2012-05-19 DIAGNOSIS — M25559 Pain in unspecified hip: Secondary | ICD-10-CM

## 2012-05-19 DIAGNOSIS — M25551 Pain in right hip: Secondary | ICD-10-CM

## 2012-05-19 MED ORDER — METHYLPREDNISOLONE ACETATE 80 MG/ML IJ SUSP
80.0000 mg | Freq: Once | INTRAMUSCULAR | Status: AC
  Administered 2012-05-19: 80 mg via INTRAMUSCULAR

## 2012-05-19 NOTE — Progress Notes (Signed)
  Subjective:    Patient ID: Alyssa Lindsey, female    DOB: October 26, 1929, 78 y.o.   MRN: [id number]  HPI Alyssa Lindsey reports the on-set of pain in the lower aspect of the buttock. She was seen by Dr. Everardo All on June 6th - little tenderness on exam, ESR normal, Pelvic films w/o osseous abnormality. She reports the pain has been progressive and is now to the point where she cannot sit comfortably. She has been taking ibuprofen 400 mg bid with a little bit of relief. She was given tramadol which made her sick and she discontinued it.   PMH, FamHx and SocHx reviewed for any changes and relevance.   Review of Systems System review is negative for any constitutional, cardiac, pulmonary, GI or neuro symptoms or complaints other than as described in the HPI.     Objective:   Physical Exam Filed Vitals:   05/19/12 1619  BP: 144/88  Pulse: 70  Temp: 98.4 F (36.9 C)  Resp: 14   Gen'l-pleasant older woman who cannot sit due to pain at ischial tuberosities. Cor- RRR Pulm - normal MSK - tender to palpation of the ischial tuberosity location.  Procedure bursal injection #1 - left ischial tuberosity  Indication - localized pain Consent - informed verbal consent from patient after explanation of risks of bleeding and infection Prep - injection site identified, prepped with betadine followed by alcohol. Med -      40 Mg depomedrol with    1 cc Cc 2% xylocain Injection - bursa space entered easily. Injected without difficulty. Patient tolerated this well. Post-procedure - patient with rapid reduction in discomfort. Bandaid applied. Routine precautions provided including instruction to return for fever, drainage or increased pain  Procedure bursal injection #2 - right ischial tuberosity  Indication - localized pain Consent - informed verbal consent from patient after explanation of risks of bleeding and infection Prep - injection site identified, prepped with betadine followed by alcohol. Med -   40 Mg depomedrol with  1 Cc 2% xylocain Injection - bursa/joint space entered easily. Injected without difficulty. Patient tolerated this well. Post-procedure - patient with rapid reduction in discomfort. Bandaid applied. Routine precautions provided including instruction to return for fever, drainage or increased pain         Assessment & Plan:  Pain at buttock - tender over the ischial tuberosity both right and left. After injection she was pain free and able to sit flat on a firm chair.  Plan- Routine injection precautions  For rapid recurrence of pain - bone scan    Reviewed all meds: she is advised to not take any additional NSAIDs if she is taking meloxicam 15 mg once a day.    Take symbicort 2 puffs twice a day on schedule - maintenance medication    Take BP medication daily.

## 2012-05-19 NOTE — Patient Instructions (Addendum)
Pain at the ischial tuberosities - part of the posterior pelvis. Reviewed the previous x-rays from June 6th - no abnormality seen. Today you received an injection to the ischial tuberosity of xylocain (rapid acting pain medication) and depomedrol (long acting cortisone). The xylocain will wear off in about an hour and it takes about 6-8 hours for the depomedrol to start reducing inflammation. If the pain does not go away or if it comes back quickly we will need to get a bone scan to try to determine the source of your pain.   Watch for any sign of infection: drainage, fever, redness at the injection site.

## 2012-06-26 ENCOUNTER — Other Ambulatory Visit: Payer: Self-pay | Admitting: Internal Medicine

## 2012-06-27 ENCOUNTER — Other Ambulatory Visit: Payer: Self-pay | Admitting: *Deleted

## 2012-06-27 MED ORDER — AMITRIPTYLINE HCL 25 MG PO TABS: 25.0000 mg | ORAL_TABLET | Freq: Every day | ORAL | Status: DC

## 2012-07-25 ENCOUNTER — Other Ambulatory Visit: Payer: Self-pay | Admitting: Internal Medicine

## 2012-08-15 ENCOUNTER — Other Ambulatory Visit: Payer: Self-pay | Admitting: Internal Medicine

## 2012-10-18 ENCOUNTER — Other Ambulatory Visit: Payer: Self-pay | Admitting: Internal Medicine

## 2012-10-21 ENCOUNTER — Other Ambulatory Visit: Payer: Self-pay | Admitting: Internal Medicine

## 2012-10-24 ENCOUNTER — Other Ambulatory Visit: Payer: Self-pay | Admitting: *Deleted

## 2012-10-24 ENCOUNTER — Telehealth: Payer: Self-pay | Admitting: *Deleted

## 2012-10-24 NOTE — Telephone Encounter (Signed)
Opened encounter in error  

## 2012-11-04 ENCOUNTER — Other Ambulatory Visit: Payer: Self-pay | Admitting: *Deleted

## 2012-11-04 MED ORDER — TRAMADOL HCL 50 MG PO TABS: 50.0000 mg | ORAL_TABLET | Freq: Four times a day (QID) | ORAL | Status: DC | PRN

## 2012-11-04 NOTE — Telephone Encounter (Signed)
Done erx 

## 2012-11-17 ENCOUNTER — Other Ambulatory Visit: Payer: Self-pay | Admitting: *Deleted

## 2012-11-17 MED ORDER — TRAMADOL HCL 50 MG PO TABS: 50.0000 mg | ORAL_TABLET | Freq: Four times a day (QID) | ORAL | Status: DC | PRN

## 2012-11-17 NOTE — Telephone Encounter (Signed)
Done erx 

## 2012-11-18 NOTE — Telephone Encounter (Signed)
Refill tramadol already done dec 23

## 2012-11-22 ENCOUNTER — Other Ambulatory Visit: Payer: Self-pay | Admitting: Internal Medicine

## 2012-12-24 ENCOUNTER — Telehealth: Payer: Self-pay | Admitting: Internal Medicine

## 2012-12-24 MED ORDER — FAMOTIDINE 20 MG PO TABS: ORAL_TABLET | ORAL | Status: DC

## 2012-12-24 MED ORDER — CYCLOBENZAPRINE HCL 5 MG PO TABS: ORAL_TABLET | ORAL | Status: DC

## 2012-12-24 MED ORDER — METFORMIN HCL 500 MG PO TABS: ORAL_TABLET | ORAL | Status: DC

## 2012-12-24 NOTE — Telephone Encounter (Signed)
Caller: Alyssa Lindsey/Patient; Phone: [phone]; Reason for Call: Patient calling to advise that she is changing preferred pharmacy to [facility]  States she needs new Rx for her metformin 500mg , 2 tab qd, and also her fomatadine 20mg , 1 tab BID, and cyclobenzaprine 5mg , 1 tab TID.  States cannot transfer her current available refills and would like new Rx sent to [facility].  INfo to office for staff/provider review/Rx/callback.  May reach patient at [phone].

## 2012-12-24 NOTE — Telephone Encounter (Signed)
Rx's sent to [facility], pt informed.  

## 2012-12-25 ENCOUNTER — Other Ambulatory Visit: Payer: Self-pay | Admitting: *Deleted

## 2012-12-25 MED ORDER — LISINOPRIL-HYDROCHLOROTHIAZIDE 20-25 MG PO TABS: 1.0000 | ORAL_TABLET | Freq: Every day | ORAL | Status: DC

## 2012-12-25 NOTE — Telephone Encounter (Signed)
To fax rx today.

## 2012-12-25 NOTE — Telephone Encounter (Signed)
Fine to send in a new Rx

## 2012-12-25 NOTE — Telephone Encounter (Signed)
Pt is changing pharmacies, needs an rx for Lisinopril. Please advise.

## 2012-12-29 ENCOUNTER — Ambulatory Visit (INDEPENDENT_AMBULATORY_CARE_PROVIDER_SITE_OTHER): Payer: Medicare Other | Admitting: Internal Medicine

## 2012-12-29 ENCOUNTER — Encounter: Payer: Self-pay | Admitting: Internal Medicine

## 2012-12-29 VITALS — BP 130/78 | HR 74 | Temp 98.5°F | Resp 10 | Wt 168.1 lb

## 2012-12-29 DIAGNOSIS — G44209 Tension-type headache, unspecified, not intractable: Secondary | ICD-10-CM

## 2012-12-29 MED ORDER — IBUPROFEN 600 MG PO TABS: 600.0000 mg | ORAL_TABLET | Freq: Three times a day (TID) | ORAL | Status: DC | PRN

## 2012-12-29 NOTE — Progress Notes (Signed)
Subjective:    Patient ID: Alyssa Lindsey, female    DOB: 04/02/1929, 79 y.o.   MRN: [id number]  HPI Alyssa Lindsey has had a headache for two weeks. She rates the pain as 7/10. Distribution is frontal-occipital. No interference with sleep. The pain will wax and wane. No N/V, no facila numbness, no blurred vision. She has tried APAP and Advil. Advil  400 mg will give her pain relief for several hours but the pain will recur. This is not the worst headache of her life. Her Blood pressure is good.   Past Medical History  Diagnosis Date  . Personal history of colonic polyps   . Type II or unspecified type diabetes mellitus without mention of complication, not stated as uncontrolled   . HTN (hypertension)   . LBP (low back pain)   . PVD (peripheral vascular disease)   . Chronic neck pain   . Spondylosis   . Diverticulosis   . Arthritis   . Diverticulosis of colon (without mention of hemorrhage)   . Hiatal hernia   . Gastritis   . Family history of malignant neoplasm of gastrointestinal tract   . Memory problem    Past Surgical History  Procedure Date  . Tubal ligation    Family History  Problem Relation Age of Onset  . Stroke Father 56  . Ovarian cancer Sister   . Breast cancer Sister   . Breast cancer Sister   . Liver disease Brother   . Colon cancer Daughter   . Diabetes Daughter   . Throat cancer Brother   . Emphysema Brother   . Lung cancer Daughter    History   Social History  . Marital Status: Married    Spouse Name: N/A    Number of Children: 4  . Years of Education: N/A   Occupational History  . Retired Advertising copywriter    Social History Main Topics  . Smoking status: Never Smoker   . Smokeless tobacco: Never Used  . Alcohol Use: No  . Drug Use: No  . Sexually Active: Not on file   Other Topics Concern  . Not on file   Social History Narrative   Married '524 children: 1 son '59; 3 dtrs '53, '62, '64; 9 grandchildren; 4-5 great-grandLives with SO; dtr at  home, grandson at homeDaily Caffeine Use:  1 cupRegular Exercise -  NO    Current Outpatient Prescriptions on File Prior to Visit  Medication Sig Dispense Refill  . ALPRAZolam (XANAX) 0.5 MG tablet take 1 tablet by mouth every 6 hours if needed  100 tablet  5  . amitriptyline (ELAVIL) 25 MG tablet Take 1 tablet (25 mg total) by mouth at bedtime.  30 tablet  11  . cyclobenzaprine (FLEXERIL) 5 MG tablet take 1 tablet by mouth three times a day if needed for muscle spasm  30 tablet  1  . famotidine (PEPCID) 20 MG tablet take 1 tablet by mouth twice a day  60 tablet  5  . ibuprofen (ADVIL,MOTRIN) 200 MG tablet Take 200 mg by mouth every 6 (six) hours as needed.      [name] lisinopril-hydrochlorothiazide (PRINZIDE,ZESTORETIC) 20-25 MG per tablet Take 1 tablet by mouth daily.  90 tablet  1  . metFORMIN (GLUCOPHAGE) 500 MG tablet take 2 tablets by mouth once daily  60 tablet  5  . Olmesartan-Amlodipine-HCTZ (TRIBENZOR) 40-5-12.5 MG TABS Take 1 tablet by mouth daily.  56 tablet  0      Review of Systems  System review is negative for any constitutional, cardiac, pulmonary, GI or neuro symptoms or complaints other than as described in the HPI.     Objective:   Physical Exam Filed Vitals:   12/29/12 1614  BP: 130/78  Pulse: 74  Temp: 98.5 F (36.9 C)  Resp: 10   Gen'l- older AA woman in no distress HEENT - C&S clear PERRLA, Fundi - sharp disc margins, normal vessels, no exudates or hemorrhage Cor- RRR Pulm - normal Neuor - A&O x 3, Normal mentation, CN II-XII normal, MS normal       Assessment & Plan:  Muscle tension type headache - normal neurologic exam  Plan - Ibuprofen 600 mg three times a day until headache is gone  Watch for stomach irritation  If the headache does not respond to this treatment will consider brain imaging and/or referral to headache specialist.

## 2012-12-29 NOTE — Patient Instructions (Addendum)
Muscle tension type headache - normal neurologic exam  Plan - Ibuprofen 600 mg three times a day until headache is gone  Watch for stomach irritation  If the headache does not respond to this treatment will consider brain imaging and/or referral to headache specialist.    Tension Headache A tension headache is a feeling of pain, pressure, or aching often felt over the front and sides of the head. The pain can be dull or can feel tight (constricting). It is the most common type of headache. Tension headaches are not normally associated with nausea or vomiting and do not get worse with physical activity. Tension headaches can last 30 minutes to several days.   CAUSES   The exact cause is not known, but it may be caused by chemicals and hormones in the brain that lead to pain. Tension headaches often begin after stress, anxiety, or depression. Other triggers may include:  Alcohol.   Caffeine (too much or withdrawal).   Respiratory infections (colds, flu, sinus infections).   Dental problems or teeth clenching.   Fatigue.   Holding your head and neck in one position too long while using a computer.  SYMPTOMS    Pressure around the head.     Dull, aching head pain.     Pain felt over the front and sides of the head.     Tenderness in the muscles of the head, neck, and shoulders.  DIAGNOSIS   A tension headache is often diagnosed based on:    Symptoms.     Physical examination.     A CT scan or MRI of your head. These tests may be ordered if symptoms are severe or unusual.  TREATMENT   Medicines may be given to help relieve symptoms.   HOME CARE INSTRUCTIONS    Only take over-the-counter or prescription medicines for pain or discomfort as directed by your caregiver.     Lie down in a dark, quiet room when you have a headache.     Keep a journal to find out what may be triggering your headaches. For example, write down:   What you eat and drink.   How much sleep you get.     Any change to your diet or medicines.   Try massage or other relaxation techniques.     Ice packs or heat applied to the head and neck can be used. Use these 3 to 4 times per day for 15 to 20 minutes each time, or as needed.     Limit stress.     Sit up straight, and do not tense your muscles.     Quit smoking if you smoke.   Limit alcohol use.   Decrease the amount of caffeine you drink, or stop drinking caffeine.   Eat and exercise regularly.   Get 7 to 9 hours of sleep, or as recommended by your caregiver.   Avoid excessive use of pain medicine as recurrent headaches can occur.     SEEK MEDICAL CARE IF:    You have problems with the medicines you were prescribed.   Your medicines do not work.   You have a change from the usual headache.   You have nausea or vomiting.  SEEK IMMEDIATE MEDICAL CARE IF:    Your headache becomes severe.   You have a fever.   You have a stiff neck.   You have loss of vision.   You have muscular weakness or loss of muscle control.   You  lose your balance or have trouble walking.   You feel faint or pass out.   You have severe symptoms that are different from your first symptoms.  MAKE SURE YOU:    Understand these instructions.   Will watch your condition.   Will get help right away if you are not doing well or get worse.  Document Released: 11/12/2005 Document Revised: 02/04/2012 Document Reviewed: 11/02/2011 [facility] Patient Information 2013 [place], [place].

## 2013-03-06 ENCOUNTER — Other Ambulatory Visit: Payer: Self-pay | Admitting: Internal Medicine

## 2013-03-29 ENCOUNTER — Other Ambulatory Visit: Payer: Self-pay | Admitting: Internal Medicine

## 2013-04-24 ENCOUNTER — Other Ambulatory Visit: Payer: Self-pay | Admitting: Internal Medicine

## 2013-05-05 ENCOUNTER — Telehealth: Payer: Self-pay | Admitting: Gastroenterology

## 2013-05-05 NOTE — Telephone Encounter (Signed)
Pt reports some nausea and l side abdominal pain; states she also feels as though she has a fever. Pt will see Willette Cluster, NP on 05/07/13.

## 2013-05-07 ENCOUNTER — Ambulatory Visit: Payer: Medicare Other | Admitting: Nurse Practitioner

## 2013-05-26 ENCOUNTER — Other Ambulatory Visit: Payer: Self-pay | Admitting: Internal Medicine

## 2013-07-09 ENCOUNTER — Other Ambulatory Visit: Payer: Self-pay | Admitting: Internal Medicine

## 2013-07-21 ENCOUNTER — Other Ambulatory Visit: Payer: Self-pay | Admitting: Internal Medicine

## 2013-08-14 ENCOUNTER — Other Ambulatory Visit: Payer: Self-pay | Admitting: Internal Medicine

## 2013-08-22 ENCOUNTER — Other Ambulatory Visit: Payer: Self-pay | Admitting: Internal Medicine

## 2013-08-29 ENCOUNTER — Other Ambulatory Visit: Payer: Self-pay | Admitting: Internal Medicine

## 2013-08-31 ENCOUNTER — Ambulatory Visit (INDEPENDENT_AMBULATORY_CARE_PROVIDER_SITE_OTHER): Payer: Medicare Other | Admitting: Internal Medicine

## 2013-08-31 ENCOUNTER — Encounter: Payer: Self-pay | Admitting: Internal Medicine

## 2013-08-31 ENCOUNTER — Other Ambulatory Visit (INDEPENDENT_AMBULATORY_CARE_PROVIDER_SITE_OTHER): Payer: Medicare Other

## 2013-08-31 VITALS — BP 210/98 | HR 59 | Temp 97.4°F | Wt 166.0 lb

## 2013-08-31 DIAGNOSIS — Z23 Encounter for immunization: Secondary | ICD-10-CM

## 2013-08-31 DIAGNOSIS — E119 Type 2 diabetes mellitus without complications: Secondary | ICD-10-CM

## 2013-08-31 DIAGNOSIS — E538 Deficiency of other specified B group vitamins: Secondary | ICD-10-CM

## 2013-08-31 DIAGNOSIS — J069 Acute upper respiratory infection, unspecified: Secondary | ICD-10-CM

## 2013-08-31 DIAGNOSIS — I10 Essential (primary) hypertension: Secondary | ICD-10-CM

## 2013-08-31 LAB — CBC WITH DIFFERENTIAL/PLATELET
Basophils Relative: 0.9 % (ref 0.0–3.0)
Eosinophils Relative: 9.7 % — ABNORMAL HIGH (ref 0.0–5.0)
HCT: 37.3 % (ref 36.0–46.0)
Hemoglobin: 12.1 g/dL (ref 12.0–15.0)
Lymphs Abs: 1.9 10*3/uL (ref 0.7–4.0)
MCV: 82.1 fl (ref 78.0–100.0)
Monocytes Absolute: 0.5 10*3/uL (ref 0.1–1.0)
Neutro Abs: 1.9 10*3/uL (ref 1.4–7.7)
RBC: 4.55 Mil/uL (ref 3.87–5.11)
RDW: 15.9 % — ABNORMAL HIGH (ref 11.5–14.6)
WBC: 4.9 10*3/uL (ref 4.5–10.5)

## 2013-08-31 LAB — COMPREHENSIVE METABOLIC PANEL
AST: 13 U/L (ref 0–37)
Alkaline Phosphatase: 58 U/L (ref 39–117)
BUN: 15 mg/dL (ref 6–23)
Creatinine, Ser: 1.1 mg/dL (ref 0.4–1.2)
GFR: 63.07 mL/min (ref >60.00)
Glucose, Bld: 84 mg/dL (ref 70–99)
Potassium: 4 mEq/L (ref 3.5–5.1)
Total Bilirubin: 0.5 mg/dL (ref 0.3–1.2)

## 2013-08-31 LAB — VITAMIN B12: Vitamin B-12: 409 pg/mL (ref 211–911)

## 2013-08-31 NOTE — Progress Notes (Signed)
Subjective:    Patient ID: Alyssa Lindsey, female    DOB: [birth year], 79 y.o.   MRN: [id number]  HPI Mrs. Karam presents for a bad cold for the past week with sore throat and mucus. She has not any documented fever, no rigors, no SOB, no DOE. NO c/p, no N/V/D. She has been taking theraflu severe cold and cough x 1 dose; mucinex - question of "D:"   Blood pressure elevation - she missed dose of lisinopril/hct today. She denies HA, epistaxis, blurred or double vision, chest tightness  Past Medical History  Diagnosis Date  . Personal history of colonic polyps   . Type II or unspecified type diabetes mellitus without mention of complication, not stated as uncontrolled   . HTN (hypertension)   . LBP (low back pain)   . PVD (peripheral vascular disease)   . Chronic neck pain   . Spondylosis   . Diverticulosis   . Arthritis   . Diverticulosis of colon (without mention of hemorrhage)   . Hiatal hernia   . Gastritis   . Family history of malignant neoplasm of gastrointestinal tract   . Memory problem    Past Surgical History  Procedure Laterality Date  . Tubal ligation     Family History  Problem Relation Age of Onset  . Stroke Father 26  . Ovarian cancer Sister   . Breast cancer Sister   . Breast cancer Sister   . Liver disease Brother   . Colon cancer Daughter   . Diabetes Daughter   . Throat cancer Brother   . Emphysema Brother   . Lung cancer Daughter    History   Social History  . Marital Status: Married    Spouse Name: N/A    Number of Children: 4  . Years of Education: N/A   Occupational History  . Retired Advertising copywriter    Social History Main Topics  . Smoking status: Never Smoker   . Smokeless tobacco: Never Used  . Alcohol Use: No  . Drug Use: No  . Sexual Activity: Not on file   Other Topics Concern  . Not on file   Social History Narrative   Married '52   4 children: 1 son '59; 3 dtrs '53, '62, '64; 9 grandchildren; 4-5 great-grand   Lives with  SO; dtr at home, grandson at home      Daily Caffeine Use:  1 cup   Regular Exercise -  NO         Current Outpatient Prescriptions on File Prior to Visit  Medication Sig Dispense Refill  . ALPRAZolam (XANAX) 0.5 MG tablet take 1 tablet by mouth every 6 hours if needed  100 tablet  5  . cyclobenzaprine (FLEXERIL) 5 MG tablet TAKE 1 TABLET BY MOUTH 3 TIMES A DAY IF NEEDED FOR MUSCLE SPASMS  30 tablet  0  . famotidine (PEPCID) 20 MG tablet take 1 tablet by mouth twice a day  60 tablet  5  . ibuprofen (ADVIL,MOTRIN) 200 MG tablet Take 200 mg by mouth every 6 (six) hours as needed.      [name] ibuprofen (ADVIL,MOTRIN) 600 MG tablet TAKE 1 TABLET (600 MG TOTAL) BY MOUTH EVERY 8 (EIGHT) HOURS AS NEEDED FOR PAIN.  90 tablet  2  . lisinopril-hydrochlorothiazide (PRINZIDE,ZESTORETIC) 20-25 MG per tablet TAKE 1 TABLET BY MOUTH EVERY DAY  90 tablet  1  . meloxicam (MOBIC) 15 MG tablet TAKE 1 TABLET BY MOUTH ONCE DAILY  30 tablet  0  .  metFORMIN (GLUCOPHAGE) 500 MG tablet TAKE 2 TABLETS BY MOUTH ONCE DAILY  60 tablet  5  . Olmesartan-Amlodipine-HCTZ (TRIBENZOR) 40-5-12.5 MG TABS Take 1 tablet by mouth daily.  56 tablet  0  . amitriptyline (ELAVIL) 25 MG tablet Take 1 tablet (25 mg total) by mouth at bedtime.  30 tablet  11   No current facility-administered medications on file prior to visit.       Review of Systems System review is negative for any constitutional, cardiac, pulmonary, GI or neuro symptoms or complaints other than as described in the HPI.     Objective:   Physical Exam Filed Vitals:   08/31/13 1014  BP: 210/98  Pulse: 59  Temp: 97.4 F (36.3 C)   BP Readings from Last 3 Encounters:  08/31/13 210/98  12/29/12 130/78  05/19/12 144/88   Gen'l - WNWD woman in no acute distress HEENT- EACs/TMs normal, Throat clear, no sinus tendernes, PERRLA - fundi w/o exudate, hemorrhage, normal disc margins, no AV nicking. Neck - supple Nodes - negative cerivcal Cor RRR Pulm - normal  respirations, Lungs CTAP       Assessment & Plan:  1. Viral URI Plan supportive care -see AVS

## 2013-08-31 NOTE — Patient Instructions (Addendum)
Viral upper respiratory infection, no evidence of bacterial infection, thus no need for an antibiotic.  Plan mucinex 1200 mg twice a day (plain mucinex)  Cough syrup of choice - sugar free with DM  Tylenol 500 mg every 4 hours as needed for fever, aches  Hydrate - beverage of choice  Vitamin C 1500mg  once a day. Ecchinacea as directed  Stove vaporizer as needed  Blood pressure is way high today.  BP Readings from Last 3 Encounters:  08/31/13 210/98  12/29/12 130/78  05/19/12 144/88   Plan  check your medications at home  Recheck Blood pressure at home for the next day or two and call me in a report!    Upper Respiratory Infection, Adult An upper respiratory infection (URI) is also sometimes known as the common cold. The upper respiratory tract includes the nose, sinuses, throat, trachea, and bronchi. Bronchi are the airways leading to the lungs. Most people improve within 1 week, but symptoms can last up to 2 weeks. A residual cough may last even longer.  CAUSES Many different viruses can infect the tissues lining the upper respiratory tract. The tissues become irritated and inflamed and often become very moist. Mucus production is also common. A cold is contagious. You can easily spread the virus to others by oral contact. This includes kissing, sharing a glass, coughing, or sneezing. Touching your mouth or nose and then touching a surface, which is then touched by another person, can also spread the virus. SYMPTOMS  Symptoms typically develop 1 to 3 days after you come in contact with a cold virus. Symptoms vary from person to person. They may include:  Runny nose.  Sneezing.  Nasal congestion.  Sinus irritation.  Sore throat.  Loss of voice (laryngitis).  Cough.  Fatigue.  Muscle aches.  Loss of appetite.  Headache.  Low-grade fever. DIAGNOSIS  You might diagnose your own cold based on familiar symptoms, since most people get a cold 2 to 3 times a year. Your  caregiver can confirm this based on your exam. Most importantly, your caregiver can check that your symptoms are not due to another disease such as strep throat, sinusitis, pneumonia, asthma, or epiglottitis. Blood tests, throat tests, and X-rays are not necessary to diagnose a common cold, but they may sometimes be helpful in excluding other more serious diseases. Your caregiver will decide if any further tests are required. RISKS AND COMPLICATIONS  You may be at risk for a more severe case of the common cold if you smoke cigarettes, have chronic heart disease (such as heart failure) or lung disease (such as asthma), or if you have a weakened immune system. The very young and very old are also at risk for more serious infections. Bacterial sinusitis, middle ear infections, and bacterial pneumonia can complicate the common cold. The common cold can worsen asthma and chronic obstructive pulmonary disease (COPD). Sometimes, these complications can require emergency medical care and may be life-threatening. PREVENTION  The best way to protect against getting a cold is to practice good hygiene. Avoid oral or hand contact with people with cold symptoms. Wash your hands often if contact occurs. There is no clear evidence that vitamin C, vitamin E, echinacea, or exercise reduces the chance of developing a cold. However, it is always recommended to get plenty of rest and practice good nutrition. TREATMENT  Treatment is directed at relieving symptoms. There is no cure. Antibiotics are not effective, because the infection is caused by a virus, not by bacteria.  Treatment may include:  Increased fluid intake. Sports drinks offer valuable electrolytes, sugars, and fluids.  Breathing heated mist or steam (vaporizer or shower).  Eating chicken soup or other clear broths, and maintaining good nutrition.  Getting plenty of rest.  Using gargles or lozenges for comfort.  Controlling fevers with ibuprofen or  acetaminophen as directed by your caregiver.  Increasing usage of your inhaler if you have asthma. Zinc gel and zinc lozenges, taken in the first 24 hours of the common cold, can shorten the duration and lessen the severity of symptoms. Pain medicines may help with fever, muscle aches, and throat pain. A variety of non-prescription medicines are available to treat congestion and runny nose. Your caregiver can make recommendations and may suggest nasal or lung inhalers for other symptoms.  HOME CARE INSTRUCTIONS   Only take over-the-counter or prescription medicines for pain, discomfort, or fever as directed by your caregiver.  Use a warm mist humidifier or inhale steam from a shower to increase air moisture. This may keep secretions moist and make it easier to breathe.  Drink enough water and fluids to keep your urine clear or pale yellow.  Rest as needed.  Return to work when your temperature has returned to normal or as your caregiver advises. You may need to stay home longer to avoid infecting others. You can also use a face mask and careful hand washing to prevent spread of the virus. SEEK MEDICAL CARE IF:   After the first few days, you feel you are getting worse rather than better.  You need your caregiver's advice about medicines to control symptoms.  You develop chills, worsening shortness of breath, or brown or red sputum. These may be signs of pneumonia.  You develop yellow or brown nasal discharge or pain in the face, especially when you bend forward. These may be signs of sinusitis.  You develop a fever, swollen neck glands, pain with swallowing, or white areas in the back of your throat. These may be signs of strep throat. SEEK IMMEDIATE MEDICAL CARE IF:   You have a fever.  You develop severe or persistent headache, ear pain, sinus pain, or chest pain.  You develop wheezing, a prolonged cough, cough up blood, or have a change in your usual mucus (if you have chronic lung  disease).  You develop sore muscles or a stiff neck. Document Released: 05/08/2001 Document Revised: 02/04/2012 Document Reviewed: 03/16/2011 [facility] Patient Information 2014 [place], [place].

## 2013-09-01 ENCOUNTER — Telehealth: Payer: Self-pay

## 2013-09-01 MED ORDER — AMLODIPINE BESYLATE 5 MG PO TABS: 5.0000 mg | ORAL_TABLET | Freq: Every day | ORAL | Status: DC

## 2013-09-01 NOTE — Telephone Encounter (Signed)
New Rx for amlodipine 5 mg once a day sent to pharmacy

## 2013-09-01 NOTE — Telephone Encounter (Signed)
Phone call from patient's daughter Alyssa Lindsey stating patient's blood pressure this morning is 187/99,. Please advise.

## 2013-09-01 NOTE — Telephone Encounter (Signed)
Patient notified of new prescription.

## 2013-09-07 ENCOUNTER — Encounter: Payer: Self-pay | Admitting: Internal Medicine

## 2013-09-22 ENCOUNTER — Other Ambulatory Visit: Payer: Self-pay | Admitting: Internal Medicine

## 2013-10-15 ENCOUNTER — Other Ambulatory Visit: Payer: Self-pay | Admitting: Internal Medicine

## 2013-10-27 ENCOUNTER — Other Ambulatory Visit: Payer: Self-pay | Admitting: Internal Medicine

## 2013-10-27 ENCOUNTER — Other Ambulatory Visit: Payer: Self-pay | Admitting: *Deleted

## 2013-10-27 ENCOUNTER — Telehealth: Payer: Self-pay | Admitting: *Deleted

## 2013-10-27 MED ORDER — CYCLOBENZAPRINE HCL 5 MG PO TABS: ORAL_TABLET | ORAL | Status: DC

## 2013-10-27 NOTE — Telephone Encounter (Signed)
Nettie Elm from [facility] called requesting clarification on Flexeril refill denial.  Denial reads "denied 11.20.14 #30" however Flexeril was not prescribed on 11.20.14 Mobic was; requests whether pt was to be taken off Flexeril and prescribed Mobic or is she to take both.  If so Rx for Flexeril is needed.  Please advise

## 2013-10-27 NOTE — Telephone Encounter (Signed)
Wow! That got confusing:  She is to take flexeril TID as needed for muscle spasm. May send in a refill request. She is to take Meloxicam daily for pain management.

## 2013-10-27 NOTE — Telephone Encounter (Signed)
Rx sent spoke with Nettie Elm at [facility] advising same

## 2013-11-20 ENCOUNTER — Other Ambulatory Visit: Payer: Self-pay | Admitting: Internal Medicine

## 2013-11-27 ENCOUNTER — Encounter: Payer: Self-pay | Admitting: *Deleted

## 2013-12-03 ENCOUNTER — Other Ambulatory Visit: Payer: Self-pay | Admitting: Internal Medicine

## 2013-12-04 ENCOUNTER — Encounter: Payer: Self-pay | Admitting: *Deleted

## 2013-12-04 ENCOUNTER — Ambulatory Visit (INDEPENDENT_AMBULATORY_CARE_PROVIDER_SITE_OTHER): Payer: Managed Care, Other (non HMO) | Admitting: Gastroenterology

## 2013-12-04 VITALS — BP 140/80 | HR 76 | Ht 65.0 in | Wt 168.4 lb

## 2013-12-04 DIAGNOSIS — K5730 Diverticulosis of large intestine without perforation or abscess without bleeding: Secondary | ICD-10-CM

## 2013-12-04 DIAGNOSIS — K219 Gastro-esophageal reflux disease without esophagitis: Secondary | ICD-10-CM

## 2013-12-04 NOTE — Progress Notes (Signed)
This is a 80 year old African American female who has GERD treated with when necessary ranitidine.  She denies reflux symptoms or dysphagia.  She is having regular bowel movements without melena or hematochezia or abdominal pain.  In essence, she denies any gastrointestinal problems or general medical problems at this time.  She is diabetic and is on metformin.  She's had 2 negative colonoscopies in the last 10 years.  Family history is noncontributory.  Current Medications, Allergies, Past Medical History, Past Surgical History, Family History and Social History were reviewed in [facility] electronic medical record.  ROS: All systems were reviewed and are negative unless otherwise stated in the HPI.          Physical Exam: Blood pressure 140/80, pulse 76 and regular and weight 168 with a BMI of 28.82.  There is no organomegaly, abdominal masses or tenderness.  Bowel sounds are normal.  Mental status is normal.  Chest is clear she appear to be in a regular rhythm without murmurs gallops or rubs.    Assessment and Plan: Chronic acid reflux doing well with when necessary H2 blocker therapy.  She has no lower gastrointestinal problems, and does not need followup colonoscopy at age 80 with 2 prior negative colonoscopy exams.  I reviewed antireflux regime with her and advised her to followup with GI as needed in the future.

## 2013-12-04 NOTE — Patient Instructions (Signed)
Please follow up in one year or as needed.

## 2013-12-29 ENCOUNTER — Other Ambulatory Visit: Payer: Self-pay | Admitting: Internal Medicine

## 2014-01-07 ENCOUNTER — Other Ambulatory Visit: Payer: Self-pay | Admitting: Internal Medicine

## 2014-01-23 ENCOUNTER — Other Ambulatory Visit: Payer: Self-pay | Admitting: Internal Medicine

## 2014-02-05 ENCOUNTER — Other Ambulatory Visit: Payer: Self-pay | Admitting: Internal Medicine

## 2014-02-05 NOTE — Telephone Encounter (Signed)
[facility] phoned stating ins co will not cover patient's flexeril (FOR REAL???).  Please advise alternate.  CB# [phone]

## 2014-02-22 ENCOUNTER — Encounter: Payer: Self-pay | Admitting: Gastroenterology

## 2014-03-04 ENCOUNTER — Emergency Department (HOSPITAL_COMMUNITY): Payer: Medicare HMO

## 2014-03-04 ENCOUNTER — Encounter (HOSPITAL_COMMUNITY): Payer: Self-pay | Admitting: Emergency Medicine

## 2014-03-04 ENCOUNTER — Encounter: Payer: Self-pay | Admitting: Internal Medicine

## 2014-03-04 ENCOUNTER — Ambulatory Visit (INDEPENDENT_AMBULATORY_CARE_PROVIDER_SITE_OTHER): Payer: Managed Care, Other (non HMO) | Admitting: Internal Medicine

## 2014-03-04 ENCOUNTER — Emergency Department (HOSPITAL_COMMUNITY)
Admission: EM | Admit: 2014-03-04 | Discharge: 2014-03-04 | Disposition: A | Discharge status: Home / Self Care | Payer: Medicare HMO | Attending: Emergency Medicine | Admitting: Emergency Medicine

## 2014-03-04 VITALS — BP 170/90 | HR 121 | Temp 98.4°F | Wt 171.0 lb

## 2014-03-04 DIAGNOSIS — M1710 Unilateral primary osteoarthritis, unspecified knee: Secondary | ICD-10-CM

## 2014-03-04 DIAGNOSIS — Z79899 Other long term (current) drug therapy: Secondary | ICD-10-CM | POA: Insufficient documentation

## 2014-03-04 DIAGNOSIS — M129 Arthropathy, unspecified: Secondary | ICD-10-CM | POA: Insufficient documentation

## 2014-03-04 DIAGNOSIS — I10 Essential (primary) hypertension: Secondary | ICD-10-CM

## 2014-03-04 DIAGNOSIS — E119 Type 2 diabetes mellitus without complications: Secondary | ICD-10-CM | POA: Insufficient documentation

## 2014-03-04 DIAGNOSIS — Z8719 Personal history of other diseases of the digestive system: Secondary | ICD-10-CM | POA: Insufficient documentation

## 2014-03-04 DIAGNOSIS — I4891 Unspecified atrial fibrillation: Secondary | ICD-10-CM | POA: Insufficient documentation

## 2014-03-04 DIAGNOSIS — IMO0002 Reserved for concepts with insufficient information to code with codable children: Secondary | ICD-10-CM

## 2014-03-04 DIAGNOSIS — M25569 Pain in unspecified knee: Secondary | ICD-10-CM | POA: Insufficient documentation

## 2014-03-04 DIAGNOSIS — Z791 Long term (current) use of non-steroidal anti-inflammatories (NSAID): Secondary | ICD-10-CM | POA: Insufficient documentation

## 2014-03-04 DIAGNOSIS — G8929 Other chronic pain: Secondary | ICD-10-CM | POA: Insufficient documentation

## 2014-03-04 DIAGNOSIS — M1711 Unilateral primary osteoarthritis, right knee: Secondary | ICD-10-CM

## 2014-03-04 DIAGNOSIS — N390 Urinary tract infection, site not specified: Secondary | ICD-10-CM | POA: Insufficient documentation

## 2014-03-04 DIAGNOSIS — I480 Paroxysmal atrial fibrillation: Secondary | ICD-10-CM | POA: Diagnosis present

## 2014-03-04 DIAGNOSIS — M25561 Pain in right knee: Secondary | ICD-10-CM | POA: Diagnosis present

## 2014-03-04 DIAGNOSIS — I499 Cardiac arrhythmia, unspecified: Secondary | ICD-10-CM

## 2014-03-04 LAB — URINALYSIS, ROUTINE W REFLEX MICROSCOPIC
BILIRUBIN URINE: NEGATIVE
Glucose, UA: NEGATIVE mg/dL
Hgb urine dipstick: NEGATIVE
KETONES UR: NEGATIVE mg/dL
Nitrite: POSITIVE — AB
PROTEIN: NEGATIVE mg/dL
Specific Gravity, Urine: 1.011 (ref 1.005–1.030)
UROBILINOGEN UA: 0.2 mg/dL (ref 0.0–1.0)
pH: 7 (ref 5.0–8.0)

## 2014-03-04 LAB — CBC WITH DIFFERENTIAL/PLATELET
BASOS PCT: 0 % (ref 0–1)
Basophils Absolute: 0 10*3/uL (ref 0.0–0.1)
EOS ABS: 0.3 10*3/uL (ref 0.0–0.7)
Eosinophils Relative: 6 % — ABNORMAL HIGH (ref 0–5)
HCT: 37.5 % (ref 36.0–46.0)
Hemoglobin: 12.3 g/dL (ref 12.0–15.0)
LYMPHS ABS: 1.8 10*3/uL (ref 0.7–4.0)
Lymphocytes Relative: 38 % (ref 12–46)
MCH: 26.7 pg (ref 26.0–34.0)
MCHC: 32.8 g/dL (ref 30.0–36.0)
MCV: 81.3 fL (ref 78.0–100.0)
Monocytes Absolute: 0.5 10*3/uL (ref 0.1–1.0)
Monocytes Relative: 11 % (ref 3–12)
NEUTROS PCT: 45 % (ref 43–77)
Neutro Abs: 2.2 10*3/uL (ref 1.7–7.7)
PLATELETS: 196 10*3/uL (ref 150–400)
RBC: 4.61 MIL/uL (ref 3.87–5.11)
RDW: 15.9 % — ABNORMAL HIGH (ref 11.5–15.5)
WBC: 4.8 10*3/uL (ref 4.0–10.5)

## 2014-03-04 LAB — COMPREHENSIVE METABOLIC PANEL
ALBUMIN: 3.7 g/dL (ref 3.5–5.2)
ALK PHOS: 75 U/L (ref 39–117)
ALT: 9 U/L (ref 0–35)
AST: 18 U/L (ref 0–37)
BUN: 19 mg/dL (ref 6–23)
CO2: 24 mEq/L (ref 19–32)
Calcium: 9.2 mg/dL (ref 8.4–10.5)
Chloride: 107 mEq/L (ref 96–112)
Creatinine, Ser: 1.05 mg/dL (ref 0.50–1.10)
GFR calc Af Amer: 56 mL/min — ABNORMAL LOW (ref >90)
GFR calc non Af Amer: 48 mL/min — ABNORMAL LOW (ref >90)
GLUCOSE: 119 mg/dL — AB (ref 70–99)
Potassium: 3.9 mEq/L (ref 3.7–5.3)
SODIUM: 144 meq/L (ref 137–147)
TOTAL PROTEIN: 6.8 g/dL (ref 6.0–8.3)
Total Bilirubin: 0.3 mg/dL (ref 0.3–1.2)

## 2014-03-04 LAB — TROPONIN I: Troponin I: <0.3 ng/mL (ref <0.30)

## 2014-03-04 LAB — MAGNESIUM: Magnesium: 1.9 mg/dL (ref 1.5–2.5)

## 2014-03-04 LAB — T4, FREE: Free T4: 0.95 ng/dL (ref 0.80–1.80)

## 2014-03-04 LAB — PROTIME-INR
INR: 0.98 (ref 0.00–1.49)
Prothrombin Time: 12.8 seconds (ref 11.6–15.2)

## 2014-03-04 LAB — TSH: TSH: 2.87 u[IU]/mL (ref 0.350–4.500)

## 2014-03-04 LAB — URINE MICROSCOPIC-ADD ON

## 2014-03-04 LAB — HEMOGLOBIN A1C
HEMOGLOBIN A1C: 6.4 % — AB (ref <5.7)
MEAN PLASMA GLUCOSE: 137 mg/dL — AB (ref <117)

## 2014-03-04 MED ORDER — SODIUM CHLORIDE 0.9 % IV BOLUS (SEPSIS)
500.0000 mL | Freq: Once | INTRAVENOUS | Status: AC
  Administered 2014-03-04: 500 mL via INTRAVENOUS

## 2014-03-04 MED ORDER — LISINOPRIL 20 MG PO TABS
20.0000 mg | ORAL_TABLET | Freq: Every day | ORAL | Status: DC
  Administered 2014-03-04: 20 mg via ORAL
  Filled 2014-03-04: qty 1

## 2014-03-04 MED ORDER — CEPHALEXIN 500 MG PO CAPS: 500.0000 mg | ORAL_CAPSULE | Freq: Four times a day (QID) | ORAL | Status: DC

## 2014-03-04 MED ORDER — AMLODIPINE BESYLATE 5 MG PO TABS
5.0000 mg | ORAL_TABLET | Freq: Every day | ORAL | Status: DC
  Administered 2014-03-04: 5 mg via ORAL
  Filled 2014-03-04: qty 1

## 2014-03-04 MED ORDER — METOPROLOL TARTRATE 25 MG PO TABS: 12.5000 mg | ORAL_TABLET | Freq: Two times a day (BID) | ORAL | Status: DC

## 2014-03-04 MED ORDER — LISINOPRIL-HYDROCHLOROTHIAZIDE 20-25 MG PO TABS: 1.0000 | ORAL_TABLET | ORAL | Status: DC

## 2014-03-04 MED ORDER — LIDOCAINE HCL 2 % EX GEL
CUTANEOUS | Status: AC
  Filled 2014-03-04: qty 10

## 2014-03-04 MED ORDER — HYDROCHLOROTHIAZIDE 25 MG PO TABS
25.0000 mg | ORAL_TABLET | Freq: Every day | ORAL | Status: DC
  Administered 2014-03-04: 25 mg via ORAL
  Filled 2014-03-04: qty 1

## 2014-03-04 NOTE — Progress Notes (Signed)
   Subjective:    Patient ID: Alyssa Lindsey, female    DOB: December 03, 1928, 80 y.o.   MRN: [id number]  HPI  Her symptoms began 02/27/14 is pain in the right knee with weightbearing. This is described as soreness, up to level IV-5/10. She has used Advil with some relief. There is no radiation of the pain.  There was no trauma to the knee. She has no history of injections or surgery.  Unassociated is some tingling in her toes. She is a diabetic.No regular monitor.  See blood pressure; she did not take her medications this morning. Blood pressure range / average : no monitor Compliant with anti hypertemsive medication. No lightheadedness or other adverse medication effect described.  A heart healthy /low salt diet is followed. No exercise program ; compromised even more due to chief complaint.      Review of Systems  Significant headaches, epistaxis, chest pain, palpitations, exertional dyspnea, claudication, paroxysmal nocturnal dyspnea, or edema absent.        Objective:   Physical Exam Appears healthy and well-nourished & in no acute distress.Appears younger than stated age  No carotid bruits are present.No neck pain distention present at 10 - 15 degrees. Thyroid normal to palpation  Heart rhythm and rate are rapid & irregular  Chest is clear with no increased work of breathing  There is no evidence of aortic aneurysm or renal artery bruits  Abdomen soft with no organomegaly or masses. No HJR  No clubbing, cyanosis or edema present.  Pedal pulses are intact   No ischemic skin changes are present . Fingernails healthy   Alert and oriented. Strength, tone, DTRs reflexes normal  Crepitus present in knees bilaterally; no effusion appreciated. Range of motion is normal.  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 Atrial fibrillation with rapid ventricular response documented. Pulse 122. Last EKG on record reveals profound sinus bradycardia.  #3 uncontrolled hypertension; repeat BP 178/92 #4 DM , ? status  Plan: refer to ER for BP & RAF control because of increased cerebrovascular accident risk. Compliance is potential issue based on her vague responses to blood pressure & diabetes monitor. Pending labs can be done there.

## 2014-03-04 NOTE — ED Provider Notes (Signed)
CSN: [id number]     Arrival date & time 03/04/14  1029 History   First MD Initiated Contact with Patient 03/04/14 1042     No chief complaint on file.    (Consider location/radiation/quality/duration/timing/severity/associated sxs/prior Treatment) Patient is a 80 y.o. female presenting with knee pain. The history is provided by the patient.  Knee Pain Location:  Knee Time since incident:  6 days Injury: no   Knee location:  R knee Pain details:    Quality:  Aching   Radiates to:  Does not radiate   Severity:  Mild   Onset quality:  Gradual   Duration:  6 days   Timing:  Constant   Progression:  Unchanged Chronicity:  New Dislocation: no   Foreign body present:  No foreign bodies Prior injury to area:  No Relieved by:  NSAIDs Worsened by:  Activity Ineffective treatments:  None tried Associated symptoms: no back pain, no fatigue, no fever and no neck pain     Past Medical History  Diagnosis Date  . Type II or unspecified type diabetes mellitus without mention of complication, not stated as uncontrolled   . HTN (hypertension)   . LBP (low back pain)   . PVD (peripheral vascular disease)   . Chronic neck pain   . Spondylosis   . Arthritis   . Diverticulosis of colon (without mention of hemorrhage)   . Hiatal hernia   . Gastritis   . Family history of malignant neoplasm of gastrointestinal tract   . Memory problem    Past Surgical History  Procedure Laterality Date  . Tubal ligation    . Colonoscopy  2010    NORMAL-- DUE 2020   Family History  Problem Relation Age of Onset  . Stroke Father [id number]  . Ovarian cancer Sister   . Breast cancer Sister   . Breast cancer Sister   . Liver disease Brother   . Colon cancer Daughter   . Diabetes Daughter   . Throat cancer Brother   . Emphysema Brother   . Lung cancer Daughter    History  Substance Use Topics  . Smoking status: Never Smoker   . Smokeless tobacco: Never Used  . Alcohol Use: No   OB History   Grav  Para Term Preterm Abortions TAB SAB Ect Mult Living                 Review of Systems  Constitutional: Negative for fever and fatigue.  HENT: Negative for congestion and drooling.   Eyes: Negative for pain.  Respiratory: Negative for cough and shortness of breath.   Cardiovascular: Positive for palpitations (occasionally). Negative for chest pain.  Gastrointestinal: Negative for nausea, vomiting, abdominal pain and diarrhea.  Genitourinary: Negative for dysuria and hematuria.  Musculoskeletal: Negative for back pain, gait problem and neck pain.       Right knee pain  Skin: Negative for color change.  Neurological: Negative for dizziness and headaches.  Hematological: Negative for adenopathy.  Psychiatric/Behavioral: Negative for behavioral problems.  All other systems reviewed and are negative.     Allergies  Dicyclomine hcl and Aspirin  Home Medications   Current Outpatient Rx  Name  Route  Sig  Dispense  Refill  . ALPRAZolam (XANAX) 0.5 MG tablet      take 1 tablet by mouth every 6 hours if needed   100 tablet   5   . amitriptyline (ELAVIL) 25 MG tablet   Oral   Take 1 tablet (25  mg total) by mouth at bedtime.   30 tablet   11   . amLODipine (NORVASC) 5 MG tablet   Oral   Take 1 tablet (5 mg total) by mouth daily.   30 tablet   5   . famotidine (PEPCID) 20 MG tablet      TAKE 1 TABLET BY MOUTH TWICE A DAY   60 tablet   5   . famotidine (PEPCID) 20 MG tablet      TAKE 1 TABLET BY MOUTH TWICE A DAY   60 tablet   5   . ibuprofen (ADVIL,MOTRIN) 600 MG tablet      TAKE 1 TABLET (600 MG TOTAL) BY MOUTH EVERY 8 (EIGHT) HOURS AS NEEDED FOR PAIN.   90 tablet   2   . lisinopril-hydrochlorothiazide (PRINZIDE,ZESTORETIC) 20-25 MG per tablet      TAKE 1 TABLET BY MOUTH EVERY DAY   90 tablet   3   . meloxicam (MOBIC) 15 MG tablet      TAKE 1 TABLET BY MOUTH EVERY DAY   30 tablet   2   . metFORMIN (GLUCOPHAGE) 500 MG tablet      TAKE 2 TABLETS BY  MOUTH ONCE DAILY   60 tablet   5    There were no vitals taken for this visit. Physical Exam  Nursing note and vitals reviewed. Constitutional: She is oriented to person, place, and time. She appears well-developed and well-nourished.  HENT:  Head: Normocephalic and atraumatic.  Mouth/Throat: Oropharynx is clear and moist. No oropharyngeal exudate.  Eyes: Conjunctivae and EOM are normal. Pupils are equal, round, and reactive to light.  Neck: Normal range of motion. Neck supple.  Cardiovascular: Normal heart sounds and intact distal pulses.  Exam reveals no gallop and no friction rub.   No murmur heard. Afib, HR 121  Pulmonary/Chest: Effort normal and breath sounds normal. No respiratory distress. She has no wheezes.  Abdominal: Soft. Bowel sounds are normal. There is no tenderness. There is no rebound and no guarding.  Musculoskeletal: Normal range of motion. She exhibits no edema and no tenderness.  Normal active/passive range of motion of right knee.  Normal appearance of right knee.  No focal tenderness of the right knee.  2+ distal pulses in all extremities.    Neurological: She is alert and oriented to person, place, and time.  Skin: Skin is warm and dry.  Psychiatric: She has a normal mood and affect. Her behavior is normal.    ED Course  Procedures (including critical care time) Labs Review Labs Reviewed  CBC WITH DIFFERENTIAL - Abnormal; Notable for the following:    RDW 15.9 (*)    Eosinophils Relative 6 (*)    All other components within normal limits  COMPREHENSIVE METABOLIC PANEL - Abnormal; Notable for the following:    Glucose, Bld 119 (*)    GFR calc non Af Amer 48 (*)    GFR calc Af Amer 56 (*)    All other components within normal limits  URINALYSIS, ROUTINE W REFLEX MICROSCOPIC - Abnormal; Notable for the following:    Nitrite POSITIVE (*)    Leukocytes, UA SMALL (*)    All other components within normal limits  URINE MICROSCOPIC-ADD ON - Abnormal;  Notable for the following:    Bacteria, UA MANY (*)    All other components within normal limits  TROPONIN I  PROTIME-INR  MAGNESIUM  TSH  T4, FREE  HEMOGLOBIN A1C   Imaging Review Dg Chest  2 View  03/04/2014   CLINICAL DATA:  Tachycardia, hypertension.  EXAM: CHEST  2 VIEW  COMPARISON:  September 03, 2010.  FINDINGS: Stable cardiomediastinal silhouette. No pneumothorax or pleural effusion is noted. Anterior osteophyte formation is noted in lower thoracic spine. No acute pulmonary disease is noted.  IMPRESSION: No acute cardiopulmonary abnormality seen.   Electronically Signed   By: Roque Lias M.D.   On: 03/04/2014 11:49     EKG Interpretation   Date/Time:  Thursday March 04 2014 12:38:02 EDT Ventricular Rate:  62 PR Interval:  152 QRS Duration: 70 QT Interval:  403 QTC Calculation: 409 R Axis:   44 Text Interpretation:  Normal sinus rhythm Confirmed by Sonny Poth  MD,  Adilynne Fitzwater ([id number]) on 03/04/2014 2:26:15 PM      MDM   Final diagnoses:  Paroxysmal a-fib  Hypertension  Right knee pain  UTI (lower urinary tract infection)    10:53 AM 80 y.o. female sent from Dr. Caryl Never office for elevated blood pressure and A. fib with RVR. The patient states that she began having right knee pain last Saturday. She notes the pain is worse with ambulation but denies any injury. No previous surgeries to the affected knee. She denies any fevers or other symptoms including shortness of breath, chest pain, headache. She was being evaluated for the knee pain at Dr. Earmon Phoenix office she was incidentally found to be in atrial fibrillation with a heart rate in the 120s. She also had a blood pressure of 178/92. She is asymptomatic here and denies any symptoms of palpitations. She notes that she has not taken her medications today. Will get screening labwork and imaging.  Pt spontaneously converted to NSR. I discussed case w/ on call cards physician who recommended metoprolol BID and close outpt cardiology  f/u. I also notified her pcp Dr. Alwyn Ren who agrees w/ plan.    2:25 PM: Pt continues to appear well. BP has decreased appropriately w/ her home meds. I informed her how impt it was that she f/u closely w/ cards. Knee pain likely msk in nature, do not think imaging needed. Will rec continued nsaids and rest. Pt was found to have a UTI, although no gu sx, will tx given presentation and co-morbidities. I have discussed the diagnosis/risks/treatment options with the patient and family and believe the pt to be eligible for discharge home to follow-up with Cardiology w/in the next week. We also discussed returning to the ED immediately if new or worsening sx occur. We discussed the sx which are most concerning (e.g., sob, cp, palpitations) that necessitate immediate return. Medications administered to the patient during their visit and any new prescriptions provided to the patient are listed below.  Medications given during this visit Medications  amLODipine (NORVASC) tablet 5 mg (5 mg Oral Given 03/04/14 1219)  lisinopril (PRINIVIL,ZESTRIL) tablet 20 mg (20 mg Oral Given 03/04/14 1219)    And  hydrochlorothiazide (HYDRODIURIL) tablet 25 mg (25 mg Oral Given 03/04/14 1219)  lidocaine (XYLOCAINE) 2 % jelly (  Not Given 03/04/14 1327)  sodium chloride 0.9 % bolus 500 mL (500 mLs Intravenous New Bag/Given 03/04/14 1111)    New Prescriptions   CEPHALEXIN (KEFLEX) 500 MG CAPSULE    Take 1 capsule (500 mg total) by mouth 4 (four) times daily.   METOPROLOL TARTRATE (LOPRESSOR) 25 MG TABLET    Take 0.5 tablets (12.5 mg total) by mouth 2 (two) times daily.     Junius Argyle, MD 03/04/14 [phone]

## 2014-03-04 NOTE — Discharge Instructions (Signed)
Atrial Fibrillation °Atrial fibrillation is a condition that causes your heart to beat irregularly. It may also cause your heart to beat faster than normal. Atrial fibrillation can prevent your heart from pumping blood normally. It increases your risk of stroke and heart problems. °HOME CARE °· Take medications as told by your doctor. °· Only take medications that your doctor says are safe. Some medications can make the condition worse or happen again. °· If blood thinners were prescribed by your doctor, take them exactly as told. Too much can cause bleeding. Too little and you will not have the needed protection against stroke and other problems. °· Perform blood tests at home if told by your doctor. °· Perform blood tests exactly as told by your doctor. °· Do not drink alcohol. °· Do not drink beverages with caffeine such as coffee, soda, and some teas. °· Maintain a healthy weight. °· Do not use diet pills unless your doctor says they are safe. They may make heart problems worse. °· Follow diet instructions as told by your doctor. °· Exercise regularly as told by your doctor. °· Keep all follow-up appointments. °GET HELP RIGHT AWAY IF:  °· You have chest or belly (abdominal) pain. °· You feel sick to your stomach (nauseous) °· You suddenly have swollen feet and ankles. °· You feel dizzy. °· You face, arms, or legs feel numb or weak. °· There is a change in your vision or speech. °· You notice a change in the speed, rhythm, or strength of your heartbeat. °· You suddenly begin peeing (urinating) more often. °· You get tired more easily when moving or exercising. °MAKE SURE YOU:  °· Understand these instructions. °· Will watch your condition. °· Will get help right away if you are not doing well or get worse. °Document Released: 08/21/2008 Document Revised: 03/09/2013 Document Reviewed: 12/23/2012 °[employer]® Patient Information ©2014 [employer]. ° °

## 2014-03-04 NOTE — Progress Notes (Signed)
Pre visit review using our clinic review tool, if applicable. No additional management support is needed unless otherwise documented below in the visit note. 

## 2014-03-04 NOTE — ED Notes (Signed)
Patient transported to X-ray 

## 2014-03-04 NOTE — ED Notes (Signed)
MD at bedside. 

## 2014-03-04 NOTE — Patient Instructions (Addendum)
[facility] go [facility] to treat rapid heart beat & uncontrolled blood pressure.Lab studies I recommended can be completed there.

## 2014-03-10 ENCOUNTER — Ambulatory Visit: Payer: Managed Care, Other (non HMO) | Admitting: Family Medicine

## 2014-03-12 ENCOUNTER — Other Ambulatory Visit: Payer: Self-pay

## 2014-03-12 MED ORDER — METFORMIN HCL 500 MG PO TABS: ORAL_TABLET | ORAL | Status: DC

## 2014-03-17 ENCOUNTER — Encounter (INDEPENDENT_AMBULATORY_CARE_PROVIDER_SITE_OTHER): Payer: Medicare HMO

## 2014-03-17 ENCOUNTER — Ambulatory Visit (INDEPENDENT_AMBULATORY_CARE_PROVIDER_SITE_OTHER): Payer: Medicare HMO | Admitting: Cardiology

## 2014-03-17 ENCOUNTER — Encounter: Payer: Self-pay | Admitting: Cardiology

## 2014-03-17 ENCOUNTER — Encounter: Payer: Self-pay | Admitting: *Deleted

## 2014-03-17 VITALS — BP 143/94 | HR 103 | Ht 65.0 in | Wt 166.8 lb

## 2014-03-17 DIAGNOSIS — I480 Paroxysmal atrial fibrillation: Secondary | ICD-10-CM

## 2014-03-17 DIAGNOSIS — I10 Essential (primary) hypertension: Secondary | ICD-10-CM

## 2014-03-17 DIAGNOSIS — I4891 Unspecified atrial fibrillation: Secondary | ICD-10-CM

## 2014-03-17 MED ORDER — RIVAROXABAN 20 MG PO TABS: 20.0000 mg | ORAL_TABLET | Freq: Every day | ORAL | Status: DC

## 2014-03-17 NOTE — Patient Instructions (Signed)
Please start Xarelto 20 mg a day.  Do not take Ibuprofen with this medication. Continue all other medications as listed.  Your physician has recommended that you wear a holter monitor for 24 hours. Holter monitors are medical devices that record the heart's electrical activity. Doctors most often use these monitors to diagnose arrhythmias. Arrhythmias are problems with the speed or rhythm of the heartbeat. The monitor is a small, portable device. You can wear one while you do your normal daily activities. This is usually used to diagnose what is causing palpitations/syncope (passing out).  Follow up in 2 months with Dr Antoine PocheHochrein.

## 2014-03-17 NOTE — Progress Notes (Signed)
HPI The patient presents as a new patient for evaluation of atrial fibrillation. She recently was being evaluated by Dr. Alwyn RenHopper and had some knee pain. She was found to have an irregular rapid heart rhythm and was noted to be in atrial fibrillation. She was sent to the emergency room. I reviewed these records. She was in atrial fibrillation but spontaneously converted to sinus rhythm. She was given some oral beta blockers. She was discharged home. Of note she did have a urinary tract infection which was also treated.  She present today as a new patient.  She is noted to be in atrial fib with a controlled ventricular rate.  She does not feel her fibrillation. She is noticing irregular heartbeat. He has no presyncope or syncope. He denies any chest pressure, neck or arm discomfort. She denies any shortness of breath, PND or orthopnea. She's had no weight gain or edema. She is active working in the yard. I did review the ER records. Labs were unremarkable including her renal function and her thyroid studies.  Allergies  Allergen Reactions  . Dicyclomine Hcl Other (See Comments)    Severe eye pain  . Aspirin Other (See Comments)    Unknown reaction    Current Outpatient Prescriptions  Medication Sig Dispense Refill  . amLODipine (NORVASC) 5 MG tablet Take 1 tablet (5 mg total) by mouth daily.  30 tablet  5  . cephALEXin (KEFLEX) 500 MG capsule Take 1 capsule (500 mg total) by mouth 4 (four) times daily.  40 capsule  0  . famotidine (PEPCID) 20 MG tablet TAKE 1 TABLET BY MOUTH TWICE A DAY  60 tablet  5  . ibuprofen (ADVIL,MOTRIN) 600 MG tablet TAKE 1 TABLET (600 MG TOTAL) BY MOUTH EVERY 8 (EIGHT) HOURS AS NEEDED FOR PAIN.  90 tablet  2  . lisinopril-hydrochlorothiazide (PRINZIDE,ZESTORETIC) 20-25 MG per tablet TAKE 1 TABLET BY MOUTH EVERY DAY  90 tablet  3  . meloxicam (MOBIC) 15 MG tablet TAKE 1 TABLET BY MOUTH EVERY DAY  30 tablet  2  . metFORMIN (GLUCOPHAGE) 500 MG tablet *PATIENT NEEDS TO EST  NEW PCP FOR FURTHER REFILLS* TAKE 2 TABLETS BY MOUTH ONCE DAILY  60 tablet  2  . metoprolol tartrate (LOPRESSOR) 25 MG tablet Take 0.5 tablets (12.5 mg total) by mouth 2 (two) times daily.  20 tablet  0   No current facility-administered medications for this visit.    Past Medical History  Diagnosis Date  . Type II or unspecified type diabetes mellitus without mention of complication, not stated as uncontrolled   . HTN (hypertension)   . LBP (low back pain)   . PVD (peripheral vascular disease)   . Chronic neck pain   . Spondylosis   . Arthritis   . Diverticulosis of colon (without mention of hemorrhage)   . Hiatal hernia   . Gastritis   . Family history of malignant neoplasm of gastrointestinal tract   . Memory problem     Past Surgical History  Procedure Laterality Date  . Tubal ligation    . Colonoscopy  2010    NORMAL-- DUE 2020    Family History  Problem Relation Age of Onset  . Stroke Father 7062  . Ovarian cancer Sister   . Breast cancer Sister   . Breast cancer Sister   . Liver disease Brother   . Colon cancer Daughter   . Diabetes Daughter   . Throat cancer Brother   . Emphysema Brother   .  Lung cancer Daughter     History   Social History  . Marital Status: Married    Spouse Name: N/A    Number of Children: 4  . Years of Education: N/A   Occupational History  . Retired Advertising copywriterhousekeeper    Social History Main Topics  . Smoking status: Never Smoker   . Smokeless tobacco: Never Used  . Alcohol Use: No  . Drug Use: No  . Sexual Activity: Not on file   Other Topics Concern  . Not on file   Social History Narrative   Married '52   4 children: 1 son '59; 3 dtrs '53, '62, '64; 9 grandchildren; 4-5 great-grand   Lives with SO; dtr at home, grandson at home      Daily Caffeine Use:  1 cup   Regular Exercise -  NO          ROS:  Positive for reflux.  Otherwise as stated in the HPI and negative for all other systems.    PHYSICAL EXAM BP 143/94   Pulse 103  Ht 5\' 5"  (1.651 m)  Wt 166 lb 12.8 oz (75.66 kg)  BMI 27.76 kg/m2 GENERAL:  Well appearing HEENT:  Pupils equal round and reactive, fundi not visualized, oral mucosa unremarkable NECK:  No jugular venous distention, waveform within normal limits, carotid upstroke brisk and symmetric, no bruits, no thyromegaly LYMPHATICS:  No cervical, inguinal adenopathy LUNGS:  Clear to auscultation bilaterally BACK:  No CVA tenderness CHEST:  Unremarkable HEART:  PMI not displaced or sustained,S1 and S2 within normal limits, no S3, no clicks, no rubs, no murmurs, irregular ABD:  Flat, positive bowel sounds normal in frequency in pitch, no bruits, no rebound, no guarding, no midline pulsatile mass, no hepatomegaly, no splenomegaly EXT:  2 plus pulses throughout, no edema, no cyanosis no clubbing SKIN:  No rashes no nodules NEURO:  Cranial nerves II through XII grossly intact, motor grossly intact throughout PSYCH:  Cognitively intact, oriented to person place and time  EKG:  Atrial fibrillation, rate 90, axis within normal limits, intervals within normal limits, poor anterior R wave progression.  03/17/2014  ASSESSMENT AND PLAN  ATRIAL FIB:  [name] has a CHA2DS2 - VASc score of 5 with a risk of stroke of 6.7%  and a HAS - BLED score of 2 with a low risk of bleeding.  I discussed risks benefits at length. I will make sure she has a Holter to judge rate control. I will place a 24-hour monitor.  She will be started on Xarelto.    HTN:  The blood pressure is at target. No change in medications is indicated. We will continue with therapeutic lifestyle changes (TLC).

## 2014-03-17 NOTE — Progress Notes (Signed)
Patient ID: Tora PerchesDorothy B Lindsey, female   DOB: 26-Aug-1929, 80 y.o.   MRN: [id number] [facility] 24 hour holter monitor applied to patient.

## 2014-03-23 ENCOUNTER — Other Ambulatory Visit: Payer: Self-pay

## 2014-03-23 MED ORDER — MELOXICAM 15 MG PO TABS
15.0000 mg | ORAL_TABLET | Freq: Every day | ORAL | Status: DC
Start: 2014-03-23 — End: 2014-07-31

## 2014-04-14 ENCOUNTER — Other Ambulatory Visit: Payer: Self-pay | Admitting: *Deleted

## 2014-04-14 MED ORDER — AMLODIPINE BESYLATE 5 MG PO TABS: 5.0000 mg | ORAL_TABLET | Freq: Every day | ORAL | Status: DC

## 2014-04-26 ENCOUNTER — Telehealth: Payer: Self-pay | Admitting: Cardiology

## 2014-04-26 MED ORDER — METOPROLOL TARTRATE 25 MG PO TABS: 12.5000 mg | ORAL_TABLET | Freq: Two times a day (BID) | ORAL | Status: DC

## 2014-04-26 NOTE — Telephone Encounter (Signed)
New message    Daughter calling  Patient recently discharge from hospital  should patient continue metoprolol .     If so patient will need a prescription   [facility] on [address].

## 2014-05-20 ENCOUNTER — Encounter: Payer: Self-pay | Admitting: Cardiology

## 2014-05-20 ENCOUNTER — Ambulatory Visit (INDEPENDENT_AMBULATORY_CARE_PROVIDER_SITE_OTHER): Payer: Medicare HMO | Admitting: Cardiology

## 2014-05-20 VITALS — BP 120/70 | HR 60 | Ht 65.0 in | Wt 164.0 lb

## 2014-05-20 DIAGNOSIS — I10 Essential (primary) hypertension: Secondary | ICD-10-CM

## 2014-05-20 DIAGNOSIS — I480 Paroxysmal atrial fibrillation: Secondary | ICD-10-CM

## 2014-05-20 DIAGNOSIS — I4891 Unspecified atrial fibrillation: Secondary | ICD-10-CM

## 2014-05-20 MED ORDER — METOPROLOL TARTRATE 25 MG PO TABS: 12.5000 mg | ORAL_TABLET | Freq: Two times a day (BID) | ORAL | Status: DC

## 2014-05-20 NOTE — Patient Instructions (Signed)
Your physician wants you to follow-up in: 6 MONTHS WITH DR. HOCHREIN. You will receive a reminder letter in the mail two months in advance. If you don't receive a letter, please call our office to schedule the follow-up appointment.  

## 2014-05-20 NOTE — Progress Notes (Signed)
HPI The patient presents as a new patient for evaluation of atrial fibrillation.  After the last visit I placed her on  Holter which showed PAF.  She was placed on Xarelto and tolerates this.   She has had some rare palpitations.. He has no presyncope or syncope. He denies any chest pressure, neck or arm discomfort. She denies any shortness of breath, PND or orthopnea. She's had no weight gain or edema. Otherwise she feels okay. She's not having any other cardiovascular symptoms.  Allergies  Allergen Reactions  . Dicyclomine Hcl Other (See Comments)    Severe eye pain  . Aspirin Other (See Comments)    Unknown reaction    Current Outpatient Prescriptions  Medication Sig Dispense Refill  . amLODipine (NORVASC) 5 MG tablet Take 1 tablet (5 mg total) by mouth daily.  30 tablet  5  . cephALEXin (KEFLEX) 500 MG capsule Take 1 capsule (500 mg total) by mouth 4 (four) times daily.  40 capsule  0  . famotidine (PEPCID) 20 MG tablet TAKE 1 TABLET BY MOUTH TWICE A DAY  60 tablet  5  . ibuprofen (ADVIL,MOTRIN) 600 MG tablet TAKE 1 TABLET (600 MG TOTAL) BY MOUTH EVERY 8 (EIGHT) HOURS AS NEEDED FOR PAIN.  90 tablet  2  . lisinopril-hydrochlorothiazide (PRINZIDE,ZESTORETIC) 20-25 MG per tablet TAKE 1 TABLET BY MOUTH EVERY DAY  90 tablet  3  . meloxicam (MOBIC) 15 MG tablet Take 1 tablet (15 mg total) by mouth daily.  30 tablet  2  . metFORMIN (GLUCOPHAGE) 500 MG tablet *PATIENT NEEDS TO EST NEW PCP FOR FURTHER REFILLS* TAKE 2 TABLETS BY MOUTH ONCE DAILY  60 tablet  2  . metoprolol tartrate (LOPRESSOR) 25 MG tablet Take 0.5 tablets (12.5 mg total) by mouth 2 (two) times daily.  20 tablet  11  . rivaroxaban (XARELTO) 20 MG TABS tablet Take 1 tablet (20 mg total) by mouth daily with supper.  30 tablet  11   No current facility-administered medications for this visit.    Past Medical History  Diagnosis Date  . Type II or unspecified type diabetes mellitus without mention of complication, not stated as  uncontrolled   . HTN (hypertension)   . LBP (low back pain)   . Chronic neck pain   . Spondylosis   . Arthritis   . Diverticulosis of colon (without mention of hemorrhage)   . Hiatal hernia   . Gastritis   . Family history of malignant neoplasm of gastrointestinal tract   . Memory problem     Past Surgical History  Procedure Laterality Date  . Tubal ligation    . Colonoscopy  2010    NORMAL-- DUE 2020    ROS:  Positive for reflux.  Otherwise as stated in the HPI and negative for all other systems.    PHYSICAL EXAM BP 120/70  Pulse 60  Ht 5\' 5"  (1.651 m)  Wt 164 lb (74.39 kg)  BMI 27.29 kg/m2 GENERAL:  Well appearing NECK:  No jugular venous distention, waveform within normal limits, carotid upstroke brisk and symmetric, no bruits, no thyromegaly LUNGS:  Clear to auscultation bilaterally HEART:  PMI not displaced or sustained,S1 and S2 within normal limits, no S3, no clicks, no rubs, no murmurs, irregular ABD:  Flat, positive bowel sounds normal in frequency in pitch, no bruits, no rebound, no guarding, no midline pulsatile mass, no hepatomegaly, no splenomegaly EXT:  2 plus pulses throughout, no edema, no cyanosis no clubbing SKIN:  No  rashes no nodules   ASSESSMENT AND PLAN  ATRIAL FIB:  [name] has a CHA2DS2 - VASc score of 5 with a risk of stroke of 6.7%  and a HAS - BLED score of 2 with a low risk of bleeding. She had a Holter with PAF.  She does not really feel this.  No change in therapy is indicated.    HTN:  The blood pressure is at target. No change in medications is indicated. We will continue with therapeutic lifestyle changes (TLC).

## 2014-07-08 ENCOUNTER — Other Ambulatory Visit: Payer: Self-pay | Admitting: *Deleted

## 2014-07-08 MED ORDER — METFORMIN HCL 500 MG PO TABS: 500.0000 mg | ORAL_TABLET | Freq: Two times a day (BID) | ORAL | Status: DC

## 2014-07-16 ENCOUNTER — Encounter: Payer: Self-pay | Admitting: Gastroenterology

## 2014-07-26 ENCOUNTER — Telehealth: Payer: Self-pay | Admitting: Cardiology

## 2014-07-26 NOTE — Telephone Encounter (Signed)
Spoke with pt, aware of the other choices and that they maybe expensive as well. She is not interested in warfarin because of having to come to the office more often for protime. Will mail patient assistance forms to the patient at her request.

## 2014-07-26 NOTE — Telephone Encounter (Signed)
Pt wants to know if she can take something else in the place of Milda Smart is too expensive.

## 2014-07-30 ENCOUNTER — Other Ambulatory Visit (INDEPENDENT_AMBULATORY_CARE_PROVIDER_SITE_OTHER): Payer: Medicare HMO

## 2014-07-30 ENCOUNTER — Ambulatory Visit (INDEPENDENT_AMBULATORY_CARE_PROVIDER_SITE_OTHER): Payer: Medicare HMO | Admitting: Internal Medicine

## 2014-07-30 ENCOUNTER — Encounter: Payer: Self-pay | Admitting: Internal Medicine

## 2014-07-30 VITALS — BP 142/86 | HR 68 | Temp 98.2°F | Resp 13 | Wt 167.8 lb

## 2014-07-30 DIAGNOSIS — E1129 Type 2 diabetes mellitus with other diabetic kidney complication: Secondary | ICD-10-CM

## 2014-07-30 DIAGNOSIS — I10 Essential (primary) hypertension: Secondary | ICD-10-CM

## 2014-07-30 DIAGNOSIS — M79641 Pain in right hand: Secondary | ICD-10-CM

## 2014-07-30 DIAGNOSIS — R109 Unspecified abdominal pain: Secondary | ICD-10-CM

## 2014-07-30 DIAGNOSIS — R413 Other amnesia: Secondary | ICD-10-CM

## 2014-07-30 DIAGNOSIS — E041 Nontoxic single thyroid nodule: Secondary | ICD-10-CM

## 2014-07-30 DIAGNOSIS — E538 Deficiency of other specified B group vitamins: Secondary | ICD-10-CM

## 2014-07-30 DIAGNOSIS — M79609 Pain in unspecified limb: Secondary | ICD-10-CM

## 2014-07-30 LAB — BASIC METABOLIC PANEL
BUN: 13 mg/dL (ref 6–23)
CALCIUM: 9.7 mg/dL (ref 8.4–10.5)
CO2: 30 mEq/L (ref 19–32)
Chloride: 104 mEq/L (ref 96–112)
Creatinine, Ser: 1.1 mg/dL (ref 0.4–1.2)
GFR: 60.96 mL/min (ref >60.00)
Glucose, Bld: 108 mg/dL — ABNORMAL HIGH (ref 70–99)
Potassium: 4.5 mEq/L (ref 3.5–5.1)
Sodium: 140 mEq/L (ref 135–145)

## 2014-07-30 LAB — CBC WITH DIFFERENTIAL/PLATELET
Basophils Absolute: 0 10*3/uL (ref 0.0–0.1)
Basophils Relative: 0.6 % (ref 0.0–3.0)
Eosinophils Absolute: 0.3 10*3/uL (ref 0.0–0.7)
Eosinophils Relative: 6.1 % — ABNORMAL HIGH (ref 0.0–5.0)
HCT: 39.5 % (ref 36.0–46.0)
HEMOGLOBIN: 13 g/dL (ref 12.0–15.0)
Lymphocytes Relative: 38.4 % (ref 12.0–46.0)
Lymphs Abs: 1.8 10*3/uL (ref 0.7–4.0)
MCHC: 33 g/dL (ref 30.0–36.0)
MCV: 82.6 fl (ref 78.0–100.0)
MONOS PCT: 13 % — AB (ref 3.0–12.0)
Monocytes Absolute: 0.6 10*3/uL (ref 0.1–1.0)
NEUTROS ABS: 2 10*3/uL (ref 1.4–7.7)
Neutrophils Relative %: 41.9 % — ABNORMAL LOW (ref 43.0–77.0)
Platelets: 188 10*3/uL (ref 150.0–400.0)
RBC: 4.78 Mil/uL (ref 3.87–5.11)
RDW: 15.8 % — ABNORMAL HIGH (ref 11.5–15.5)
WBC: 4.7 10*3/uL (ref 4.0–10.5)

## 2014-07-30 LAB — VITAMIN B12: Vitamin B-12: 441 pg/mL (ref 211–911)

## 2014-07-30 LAB — MICROALBUMIN / CREATININE URINE RATIO
Creatinine,U: 24.3 mg/dL
MICROALB/CREAT RATIO: 2.5 mg/g (ref 0.0–30.0)
Microalb, Ur: 0.6 mg/dL (ref 0.0–1.9)

## 2014-07-30 LAB — HEMOGLOBIN A1C: Hgb A1c MFr Bld: 6.5 % (ref 4.6–6.5)

## 2014-07-30 LAB — TSH: TSH: 2.97 u[IU]/mL (ref 0.35–4.50)

## 2014-07-30 NOTE — Patient Instructions (Signed)
Your next office appointment will be determined based upon review of your pending labs . Those instructions will be transmitted to you  by mail. Minimal Blood Pressure Goal= AVERAGE < 150/90;  Ideal is an AVERAGE < 135/85. This AVERAGE should be calculated from @ least 5-7 BP readings taken @ different times of day on different days of week. You should not respond to isolated BP readings , but rather the AVERAGE for that week .Please bring your  blood pressure cuff to office visits to verify that it is reliable.It  can also be checked against the blood pressure device at the pharmacy. Finger or wrist cuffs are not dependable; an arm cuff is. 

## 2014-07-30 NOTE — Assessment & Plan Note (Signed)
A1c , urine microalbumin, BMET 

## 2014-07-30 NOTE — Assessment & Plan Note (Signed)
TSH 

## 2014-07-30 NOTE — Progress Notes (Signed)
Pre visit review using our clinic review tool, if applicable. No additional management support is needed unless otherwise documented below in the visit note. 

## 2014-07-30 NOTE — Assessment & Plan Note (Signed)
B12 level CBC

## 2014-07-30 NOTE — Assessment & Plan Note (Signed)
Blood pressure goals reviewed. BMET 

## 2014-07-31 ENCOUNTER — Other Ambulatory Visit: Payer: Self-pay | Admitting: Internal Medicine

## 2014-07-31 NOTE — Progress Notes (Signed)
   Subjective:    Patient ID: Alyssa Lindsey, female    DOB: [birth year], 80 y.o.   MRN: [id number]  HPI   She is here with  some left-sided pain 9/3 as well as some pain in R fifth digit. She also is requesting refills of her antihypertensive medication; meloxicam; metformin; generic pepcid. In reviewing the chart her last labs were 03/14/14. GFR was 56 and A1c 6 .4% She apparently has dementia and is unable to give me her blood pressure readings. She describes it as "1/80". She is using an automated blood pressure cuff. She denies any adverse effects from her medicines. She is due for ophthalmologic evaluation She does describe some burning in her toes.    Review of Systems  All below NEGATIVE: Chest pain, palpitations       Dyspnea Edema Claudication  Lightheadedness,Syncope Weight change Polyuria/phagia/dipsia    Blurred vision /diplopia/lossof vision Limb numbness/tingling/burning Non healing skin lesions Melena , rectal bleeding   Myalgias      Objective:   Physical Exam   Pertinent or positive findings include: She appears much younger than stated age. There's decreased range of motion  of cervical spine. She exhibits an slow 4. Slightly protuberant abdomens w/o masses ,tenderness or organomegaly. Splotchy vitiligo is present over feet. Light touch is normal. Pes planus noted. She also has bunions of feet. R hand normal on direct exam.Strength , tone & DTRs WNL.  General appearance :adequately nourished; in no distress. Eyes: No conjunctival inflammation or scleral icterus is present. Oral exam: Dental hygiene is good. Lips and gums are healthy appearing.There is no oropharyngeal erythema or exudate noted.  Heart:  Normal rate and regular rhythm. S1 and S2 normal without gallop, murmur, click, or rub . Lungs:Chest clear to auscultation; no wheezes, rhonchi,rales ,or rubs present.No increased work of breathing.  Abdomen: No guarding or rebound. No flank tenderness  to percussion. Skin:Warm & dry.  Intact without suspicious lesions or rashes ; no jaundice or tenting Lymphatic: No lymphadenopathy is noted about the head, neck, axilla             Assessment & Plan:  #1 abdominal/"side" pain; negative exam #2 R hand pain #3 HTN #4 DM #5 memory deficit See orders

## 2014-08-24 ENCOUNTER — Other Ambulatory Visit: Payer: Self-pay | Admitting: *Deleted

## 2014-08-24 MED ORDER — METFORMIN HCL 500 MG PO TABS: 500.0000 mg | ORAL_TABLET | Freq: Two times a day (BID) | ORAL | Status: DC

## 2014-09-02 ENCOUNTER — Ambulatory Visit (INDEPENDENT_AMBULATORY_CARE_PROVIDER_SITE_OTHER): Payer: Medicare HMO | Admitting: Internal Medicine

## 2014-09-02 ENCOUNTER — Encounter: Payer: Self-pay | Admitting: Internal Medicine

## 2014-09-02 VITALS — BP 138/88 | HR 62 | Temp 97.9°F | Resp 14 | Ht 65.0 in | Wt 166.0 lb

## 2014-09-02 DIAGNOSIS — R1032 Left lower quadrant pain: Principal | ICD-10-CM

## 2014-09-02 DIAGNOSIS — G8929 Other chronic pain: Secondary | ICD-10-CM

## 2014-09-02 DIAGNOSIS — Z23 Encounter for immunization: Secondary | ICD-10-CM

## 2014-09-02 DIAGNOSIS — R1031 Right lower quadrant pain: Secondary | ICD-10-CM

## 2014-09-02 NOTE — Progress Notes (Signed)
Pre visit review using our clinic review tool, if applicable. No additional management support is needed unless otherwise documented below in the visit note. 

## 2014-09-02 NOTE — Progress Notes (Signed)
   Subjective:    Patient ID: Alyssa Lindsey, female    DOB: [birth year], 80 y.o.   MRN: [id number]  HPI The patient is an 80 YO female who is coming in today for an acute visit for stomach pain. She states that she had one episode of diarrhea with some cramping last week. She was also feeling some chills at that time. She denies that there was any blood in it. She did not throw up and was not nauseous. Since that episode she has not had more and no more chills. She denies other complaints but wants to know if the diarrhea could be caused from her xarelto.   Review of Systems  Constitutional: Negative for fever, chills, activity change, appetite change and fatigue.  Respiratory: Negative for cough, chest tightness, shortness of breath and wheezing.   Cardiovascular: Negative for chest pain, palpitations and leg swelling.  Gastrointestinal: Negative for nausea, vomiting, abdominal pain, diarrhea, constipation, blood in stool and abdominal distention.  Neurological: Negative.       Objective:   Physical Exam  Constitutional: She appears well-developed and well-nourished. No distress.  HENT:  Head: Normocephalic and atraumatic.  Eyes: EOM are normal.  Neck: Normal range of motion.  Cardiovascular: Normal rate and regular rhythm.   Pulmonary/Chest: Effort normal and breath sounds normal. No respiratory distress. She has no wheezes. She has no rales.  Abdominal: Soft. Bowel sounds are normal. She exhibits no distension. There is no tenderness. There is no rebound and no guarding.   Filed Vitals:   09/02/14 0842  BP: 138/88  Pulse: 62  Temp: 97.9 F (36.6 C)  Resp: 14      Assessment & Plan:  Flu shot given today.

## 2014-09-02 NOTE — Assessment & Plan Note (Signed)
Doing better now and likely the episode of diarrhea was normal variation in bowel habits. Advised her to call if she has recurrence of the pain or diarrhea and otherwise to keep appointment to establish care in January. Flu shot given.

## 2014-09-02 NOTE — Patient Instructions (Addendum)
We have given you the flu shot today. We do not think that the episode of diarrhea is related to the medicine xarelto.   If you have more diarrhea call us back but we think you are okay for now.  Come back in January for a check up on the diabetes.

## 2014-09-18 ENCOUNTER — Other Ambulatory Visit: Payer: Self-pay | Admitting: Internal Medicine

## 2014-09-22 ENCOUNTER — Other Ambulatory Visit: Payer: Self-pay | Admitting: Internal Medicine

## 2014-09-27 ENCOUNTER — Ambulatory Visit (HOSPITAL_COMMUNITY): Payer: Medicare HMO | Attending: Emergency Medicine

## 2014-09-27 ENCOUNTER — Encounter (HOSPITAL_COMMUNITY): Payer: Self-pay | Admitting: Emergency Medicine

## 2014-09-27 ENCOUNTER — Emergency Department (HOSPITAL_COMMUNITY)
Admission: EM | Admit: 2014-09-27 | Discharge: 2014-09-27 | Disposition: A | Discharge status: Home / Self Care | Payer: Medicare HMO | Source: Home / Self Care | Attending: Emergency Medicine | Admitting: Emergency Medicine

## 2014-09-27 DIAGNOSIS — R0602 Shortness of breath: Secondary | ICD-10-CM | POA: Diagnosis not present

## 2014-09-27 DIAGNOSIS — J208 Acute bronchitis due to other specified organisms: Secondary | ICD-10-CM

## 2014-09-27 DIAGNOSIS — R05 Cough: Secondary | ICD-10-CM | POA: Diagnosis not present

## 2014-09-27 MED ORDER — GUAIFENESIN-CODEINE 100-10 MG/5ML PO SYRP: 5.0000 mL | ORAL_SOLUTION | Freq: Three times a day (TID) | ORAL | Status: DC | PRN

## 2014-09-27 MED ORDER — ALBUTEROL SULFATE HFA 108 (90 BASE) MCG/ACT IN AERS: 2.0000 | INHALATION_SPRAY | Freq: Four times a day (QID) | RESPIRATORY_TRACT | Status: DC

## 2014-09-27 MED ORDER — IPRATROPIUM BROMIDE 0.06 % NA SOLN: 2.0000 | Freq: Four times a day (QID) | NASAL | Status: DC

## 2014-09-27 NOTE — Discharge Instructions (Signed)

## 2014-09-27 NOTE — ED Notes (Signed)
Patient returned to [facility]

## 2014-09-27 NOTE — ED Provider Notes (Signed)
Chief Complaint   No chief complaint on file.   History of Present Illness   Alyssa Lindsey is an 80 year old female who has had a four-day history of cough productive of white sputum, nasal congestion, rhinorrhea, redness of the eyes, scratchy throat, subjective fever, chills, and diarrhea. She denies any difficulty breathing or chest pain. She's had no headache, nausea, or vomiting. She's had no sick exposures.  Review of Systems   Other than as noted above, the patient denies any of the following symptoms: Systemic:  No fevers, chills, sweats, or myalgias. Eye:  No redness or discharge. ENT:  No ear pain, headache, nasal congestion, drainage, sinus pressure, or sore throat. Neck:  No neck pain, stiffness, or swollen glands. Lungs:  No cough, sputum production, hemoptysis, wheezing, chest tightness, shortness of breath or chest pain. GI:  No abdominal pain, nausea, vomiting or diarrhea.  PMFSH   Past medical history, family history, social history, meds, and allergies were reviewed. She is allergic to aspirin. She has diabetes, hypertension, and ischemic heart disease. Current medications include amlodipine, Pepcid, lisinopril/Advicor thiazide, metformin, metoprolol, and Xarelto.  Physical exam   Vital signs:  BP 137/68 mmHg  Pulse 61  Temp(Src) 99.8 F (37.7 C) (Oral)  Resp 16  SpO2 97% General:  Alert and oriented.  In no distress.  Skin warm and dry. Eye:  No conjunctival injection or drainage. Lids were normal. ENT:  TMs and canals were normal, without erythema or inflammation.  Nasal mucosa was clear and uncongested, without drainage.  Mucous membranes were moist.  Pharynx was clear with no exudate or drainage.  There were no oral ulcerations or lesions. Neck:  Supple, no adenopathy, tenderness or mass. Lungs:  No respiratory distress.  She has rales at the left base, no wheezes or rhonchi, good air movement bilaterally.  Heart:  Regular rhythm, without gallops, murmers  or rubs. Skin:  Clear, warm, and dry, without rash or lesions.    Radiology   Dg Chest 2 View  09/27/2014   CLINICAL DATA:  Cough, weakness, shortness of breath  EXAM: CHEST  2 VIEW  COMPARISON:  03/04/2014  FINDINGS: The heart size and mediastinal contours are within normal limits. Both lungs are clear. The visualized skeletal structures are unremarkable.  IMPRESSION: No active cardiopulmonary disease.   Electronically Signed   By: Charlett NoseKevin  Dover M.D.   On: 09/27/2014 13:52   Assessment     The encounter diagnosis was Viral bronchitis.  There is no evidence of pneumonia, strep throat, sinusitis, otitis media.   Plan    1.  Meds:  The following meds were prescribed:   Discharge Medication List as of 09/27/2014  2:22 PM    START taking these medications   Details  albuterol (PROVENTIL HFA;VENTOLIN HFA) 108 (90 BASE) MCG/ACT inhaler Inhale 2 puffs into the lungs 4 (four) times daily., Starting 09/27/2014, Until Discontinued, Normal    guaiFENesin-codeine (ROBITUSSIN AC) 100-10 MG/5ML syrup Take 5 mLs by mouth 3 (three) times daily as needed for cough., Starting 09/27/2014, Until Discontinued, Print    ipratropium (ATROVENT) 0.06 % nasal spray Place 2 sprays into both nostrils 4 (four) times daily., Starting 09/27/2014, Until Discontinued, Normal        2.  Patient Education/Counseling:  The patient was given appropriate handouts, self care instructions, and instructed in symptomatic relief.  Instructed to get extra fluids and extra rest.    3.  Follow up:  The patient was told to follow up here if no  better in 3 to 4 days, or sooner if becoming worse in any way, and given some red flag symptoms such as increasing fever, difficulty breathing, chest pain, or persistent vomiting which would prompt immediate return.       Reuben Likesavid C Shiva Karis, MD 09/27/14 [phone]

## 2014-09-27 NOTE — ED Notes (Signed)
To [place] [facility]

## 2014-09-30 ENCOUNTER — Telehealth: Payer: Self-pay | Admitting: Cardiology

## 2014-09-30 NOTE — Telephone Encounter (Signed)
Pt called in stating that she has been having some tingling in her lips and pain in he thighs(since Friday) and she would like to know if this is a side effect of Xarelto. Please call  Thanks

## 2014-09-30 NOTE — Telephone Encounter (Signed)
Spoke to patient.  she states she is having tingling in her hip she wanted to know if it was caiused by Xarelto. RN informed patient it is probably not the problem and formed her to contact primary. She verbalized understanding, she states she has bronchitis at present time.

## 2014-11-01 ENCOUNTER — Other Ambulatory Visit: Payer: Self-pay | Admitting: *Deleted

## 2014-11-01 ENCOUNTER — Ambulatory Visit (INDEPENDENT_AMBULATORY_CARE_PROVIDER_SITE_OTHER): Payer: Medicare HMO | Admitting: Cardiology

## 2014-11-01 ENCOUNTER — Encounter: Payer: Self-pay | Admitting: Cardiology

## 2014-11-01 VITALS — BP 140/88 | HR 49 | Ht 65.0 in | Wt 165.0 lb

## 2014-11-01 DIAGNOSIS — I480 Paroxysmal atrial fibrillation: Secondary | ICD-10-CM

## 2014-11-01 DIAGNOSIS — I10 Essential (primary) hypertension: Secondary | ICD-10-CM

## 2014-11-01 MED ORDER — RIVAROXABAN 20 MG PO TABS: 20.0000 mg | ORAL_TABLET | Freq: Every day | ORAL | Status: DC

## 2014-11-01 NOTE — Patient Instructions (Signed)
Your physician recommends that you schedule a follow-up appointment in: one year with Dr. Hochrein  

## 2014-11-01 NOTE — Progress Notes (Signed)
HPI The patient presents as a new patient for evaluation of atrial fibrillation.    She does not feel palpitations.. He has no presyncope or syncope. He denies any chest pressure, neck or arm discomfort. She denies any shortness of breath, PND or orthopnea. She's had no weight gain or edema. Otherwise she feels okay. She's not having any other cardiovascular symptoms.  Allergies  Allergen Reactions  . Dicyclomine Hcl Other (See Comments)    Severe eye pain  . Aspirin Other (See Comments)    Unknown reaction    Current Outpatient Prescriptions  Medication Sig Dispense Refill  . albuterol (PROVENTIL HFA;VENTOLIN HFA) 108 (90 BASE) MCG/ACT inhaler Inhale 2 puffs into the lungs 4 (four) times daily. 1 Inhaler 0  . amLODipine (NORVASC) 5 MG tablet Take 1 tablet (5 mg total) by mouth daily. 30 tablet 5  . famotidine (PEPCID) 20 MG tablet TAKE 1 TABLET BY MOUTH TWICE A DAY 60 tablet 5  . guaiFENesin-codeine (ROBITUSSIN AC) 100-10 MG/5ML syrup Take 5 mLs by mouth 3 (three) times daily as needed for cough. 120 mL 0  . ipratropium (ATROVENT) 0.06 % nasal spray Place 2 sprays into both nostrils 4 (four) times daily. 15 mL 12  . lisinopril-hydrochlorothiazide (PRINZIDE,ZESTORETIC) 20-25 MG per tablet TAKE 1 TABLET BY MOUTH EVERY DAY 90 tablet 3  . meloxicam (MOBIC) 15 MG tablet TAKE 1 TABLET (15 MG TOTAL) BY MOUTH DAILY. 30 tablet 1  . metFORMIN (GLUCOPHAGE) 500 MG tablet Take 1 tablet (500 mg total) by mouth 2 (two) times daily with a meal. Must establish with new pcp for future refills 180 tablet 0  . metoprolol tartrate (LOPRESSOR) 25 MG tablet Take 0.5 tablets (12.5 mg total) by mouth 2 (two) times daily. 60 tablet 11  . rivaroxaban (XARELTO) 20 MG TABS tablet Take 1 tablet (20 mg total) by mouth daily with supper. 30 tablet 11   No current facility-administered medications for this visit.    Past Medical History  Diagnosis Date  . Type II or unspecified type diabetes mellitus without  mention of complication, not stated as uncontrolled   . HTN (hypertension)   . LBP (low back pain)   . Chronic neck pain   . Spondylosis   . Arthritis   . Diverticulosis of colon (without mention of hemorrhage)   . Hiatal hernia   . Gastritis   . Family history of malignant neoplasm of gastrointestinal tract   . Memory problem     Past Surgical History  Procedure Laterality Date  . Tubal ligation    . Colonoscopy  2010    NORMAL-- DUE 2020    ROS:  Positive for reflux.  Otherwise as stated in the HPI and negative for all other systems.    PHYSICAL EXAM BP 140/88 mmHg  Pulse 49  Ht 5\' 5"  (1.651 m)  Wt 165 lb (74.844 kg)  BMI 27.46 kg/m2 GENERAL:  Well appearing NECK:  No jugular venous distention, waveform within normal limits, carotid upstroke brisk and symmetric, no bruits, no thyromegaly LUNGS:  Clear to auscultation bilaterally HEART:  PMI not displaced or sustained,S1 and S2 within normal limits, no S3, no clicks, no rubs, no murmurs, irregular ABD:  Flat, positive bowel sounds normal in frequency in pitch, no bruits, no rebound, no guarding, no midline pulsatile mass, no hepatomegaly, no splenomegaly EXT:  2 plus pulses throughout, no edema, no cyanosis no clubbing SKIN:  No rashes no nodules  EKG:  NSR, rate 49, axis WNL intervals  WNL, no acute ST T wave changes.  11/01/2014   ASSESSMENT AND PLAN  ATRIAL FIB:  Alyssa Lindsey has a CHA2DS2 - VASc score of 5 with a risk of stroke of 6.7%  and a HAS - BLED score of 2 with a low risk of bleeding. She had a Holter with PAF.  She does not really feel this.  No change in therapy is indicated.    HTN:  The blood pressure is at target. No change in medications is indicated. We will continue with therapeutic lifestyle changes (TLC).

## 2014-11-22 ENCOUNTER — Other Ambulatory Visit: Payer: Self-pay | Admitting: Internal Medicine

## 2014-12-17 ENCOUNTER — Ambulatory Visit: Payer: Medicare HMO | Admitting: Internal Medicine

## 2014-12-20 ENCOUNTER — Other Ambulatory Visit: Payer: Self-pay | Admitting: Geriatric Medicine

## 2014-12-20 MED ORDER — AMLODIPINE BESYLATE 5 MG PO TABS: 5.0000 mg | ORAL_TABLET | Freq: Every day | ORAL | Status: DC

## 2014-12-27 ENCOUNTER — Telehealth: Payer: Self-pay | Admitting: Cardiology

## 2014-12-27 MED ORDER — RIVAROXABAN 20 MG PO TABS: 20.0000 mg | ORAL_TABLET | Freq: Every day | ORAL | Status: DC

## 2014-12-27 NOTE — Telephone Encounter (Signed)
Can we please work with Alyssa CruiseKristin to see what she can afford?

## 2014-12-27 NOTE — Telephone Encounter (Signed)
Returned call to patient.   Two complaints:   1. Stomach turning/rolling, some lip swelling  >> no new medications, no med changes  >> she has NOT been around anyone sick recently  >> she endorses "loose bowels"  >> advised patient to contact PCP r/t these symptoms and stay hydrated should be develop diarrhea 2. Ran out of Xarelto last Friday - went to [name] on [address] and was told it would cost her $80/month with insurance. She did not have a current Rx on file to this pharmacy from our office - last refill sent to [name]  >> #15 samples given  >> Rx [name]    >> educated patient on the importance of this medication and why it should not be stopped  >> advised patient she should contact our office immediately if she is running low on medication or cannot afford  >> informed patient that Dr. Antoine PocheHochrein & Belenda CruiseKristin will be notified for alternative - patient has [facility]

## 2014-12-27 NOTE — Telephone Encounter (Signed)
This message was left on my phone answering service from Friday. Pt called on Friday and said she could not afford the medicine. It was not stated what medicine patient was talking about.

## 2014-12-28 ENCOUNTER — Telehealth: Payer: Self-pay | Admitting: Cardiology

## 2014-12-28 MED ORDER — APIXABAN 5 MG PO TABS
5.0000 mg | ORAL_TABLET | Freq: Two times a day (BID) | ORAL | Status: DC
Start: 2014-12-28 — End: 2015-01-21

## 2014-12-28 NOTE — Telephone Encounter (Signed)
Spoke with patient.  Explained that we can try Eliquis and see if it has better coverage on her insurance.  If not, she would need to switch to warfarin.  Explained need for regular INR checks with warfarin.    Will call Eliquis 5 mg twice daily.  Pt to call in 2-3 days and let us know if this is cost effective for her.

## 2014-12-28 NOTE — Telephone Encounter (Signed)
Alyssa KitchensBobbie (daughter) is calling about her mother switching over to Eliquis and they call the [facility] and they couldn't give a cost because there is no prescription and they need one to give a price . Please call   Thanks

## 2014-12-28 NOTE — Telephone Encounter (Signed)
Called pt, advised her that refill had been submitted and that she should be able to pick up from pharmacy. She voiced understanding.

## 2015-01-05 ENCOUNTER — Other Ambulatory Visit (INDEPENDENT_AMBULATORY_CARE_PROVIDER_SITE_OTHER): Payer: Medicare HMO

## 2015-01-05 ENCOUNTER — Ambulatory Visit (INDEPENDENT_AMBULATORY_CARE_PROVIDER_SITE_OTHER): Payer: Medicare HMO | Admitting: Internal Medicine

## 2015-01-05 ENCOUNTER — Encounter: Payer: Self-pay | Admitting: Internal Medicine

## 2015-01-05 VITALS — BP 122/70 | HR 55 | Temp 98.1°F | Resp 14 | Ht 65.0 in | Wt 165.1 lb

## 2015-01-05 DIAGNOSIS — E119 Type 2 diabetes mellitus without complications: Secondary | ICD-10-CM

## 2015-01-05 DIAGNOSIS — G8929 Other chronic pain: Secondary | ICD-10-CM

## 2015-01-05 DIAGNOSIS — R1031 Right lower quadrant pain: Secondary | ICD-10-CM

## 2015-01-05 DIAGNOSIS — R1032 Left lower quadrant pain: Secondary | ICD-10-CM

## 2015-01-05 LAB — COMPREHENSIVE METABOLIC PANEL
ALBUMIN: 4.1 g/dL (ref 3.5–5.2)
ALT: 8 U/L (ref 0–35)
AST: 13 U/L (ref 0–37)
Alkaline Phosphatase: 69 U/L (ref 39–117)
BUN: 16 mg/dL (ref 6–23)
CO2: 30 mEq/L (ref 19–32)
Calcium: 9.4 mg/dL (ref 8.4–10.5)
Chloride: 105 mEq/L (ref 96–112)
Creatinine, Ser: 1.16 mg/dL (ref 0.40–1.20)
GFR: 57.27 mL/min — ABNORMAL LOW (ref >60.00)
Glucose, Bld: 99 mg/dL (ref 70–99)
POTASSIUM: 3.7 meq/L (ref 3.5–5.1)
SODIUM: 140 meq/L (ref 135–145)
TOTAL PROTEIN: 7.2 g/dL (ref 6.0–8.3)
Total Bilirubin: 0.5 mg/dL (ref 0.2–1.2)

## 2015-01-05 LAB — LIPASE: Lipase: 44 U/L (ref 11.0–59.0)

## 2015-01-05 LAB — CBC
HEMATOCRIT: 39.8 % (ref 36.0–46.0)
HEMOGLOBIN: 12.9 g/dL (ref 12.0–15.0)
MCHC: 32.5 g/dL (ref 30.0–36.0)
MCV: 82.3 fl (ref 78.0–100.0)
Platelets: 173 10*3/uL (ref 150.0–400.0)
RBC: 4.84 Mil/uL (ref 3.87–5.11)
RDW: 15.9 % — AB (ref 11.5–15.5)
WBC: 4.8 10*3/uL (ref 4.0–10.5)

## 2015-01-05 LAB — HEMOGLOBIN A1C: HEMOGLOBIN A1C: 6.3 % (ref 4.6–6.5)

## 2015-01-05 MED ORDER — PANTOPRAZOLE SODIUM 40 MG PO TBEC: 40.0000 mg | DELAYED_RELEASE_TABLET | Freq: Every day | ORAL | Status: DC

## 2015-01-05 NOTE — Progress Notes (Signed)
Pre visit review using our clinic review tool, if applicable. No additional management support is needed unless otherwise documented below in the visit note. 

## 2015-01-05 NOTE — Patient Instructions (Addendum)
We will check some blood work today to make sure that we are not missing anything. We would like you to start taking a medicine for acid in your stomach to see if it helps called protonix. You take 1 pill a day.   If the stomach is still having problems we may have to stop the medicine called meloxicam (also mobic). If you are still having stomach problems in 1-2 weeks we would like you to stop taking this medicine.  We will also have you work on getting some more fiber in your diet with either foods or with the fiber over the counter like metamucil or benefiber (or off brand).   Come back in about 3-4 months so we can check on your health.   High-Fiber Diet Fiber is found in fruits, vegetables, and grains. A high-fiber diet encourages the addition of more whole grains, legumes, fruits, and vegetables in your diet. The recommended amount of fiber for adult males is 38 g per day. For adult females, it is 25 g per day. Pregnant and lactating women should get 28 g of fiber per day. If you have a digestive or bowel problem, ask your caregiver for advice before adding high-fiber foods to your diet. Eat a variety of high-fiber foods instead of only a select few type of foods.  PURPOSE  To increase stool bulk.  To make bowel movements more regular to prevent constipation.  To lower cholesterol.  To prevent overeating. WHEN IS THIS DIET USED?  It may be used if you have constipation and hemorrhoids.  It may be used if you have uncomplicated diverticulosis (intestine condition) and irritable bowel syndrome.  It may be used if you need help with weight management.  It may be used if you want to add it to your diet as a protective measure against atherosclerosis, diabetes, and cancer. SOURCES OF FIBER  Whole-grain breads and cereals.  Fruits, such as apples, oranges, bananas, berries, prunes, and pears.  Vegetables, such as green peas, carrots, sweet potatoes, beets, broccoli, cabbage,  spinach, and artichokes.  Legumes, such split peas, soy, lentils.  Almonds. FIBER CONTENT IN FOODS Starches and Grains / Dietary Fiber (g)  Cheerios, 1 cup / 3 g  Corn Flakes cereal, 1 cup / 0.7 g  Rice crispy treat cereal, 1 cup / 0.3 g  Instant oatmeal (cooked),  cup / 2 g  Frosted wheat cereal, 1 cup / 5.1 g  Brown, long-grain rice (cooked), 1 cup / 3.5 g  White, long-grain rice (cooked), 1 cup / 0.6 g  Enriched macaroni (cooked), 1 cup / 2.5 g Legumes / Dietary Fiber (g)  Baked beans (canned, plain, or vegetarian),  cup / 5.2 g  Kidney beans (canned),  cup / 6.8 g  Pinto beans (cooked),  cup / 5.5 g Breads and Crackers / Dietary Fiber (g)  Plain or honey graham crackers, 2 squares / 0.7 g  Saltine crackers, 3 squares / 0.3 g  Plain, salted pretzels, 10 pieces / 1.8 g  Whole-wheat bread, 1 slice / 1.9 g  White bread, 1 slice / 0.7 g  Raisin bread, 1 slice / 1.2 g  Plain bagel, 3 oz / 2 g  Flour tortilla, 1 oz / 0.9 g  Corn tortilla, 1 small / 1.5 g  Hamburger or hotdog bun, 1 small / 0.9 g Fruits / Dietary Fiber (g)  Apple with skin, 1 medium / 4.4 g  Sweetened applesauce,  cup / 1.5 g  Banana,  medium / 1.5 g  Grapes, 10 grapes / 0.4 g  Orange, 1 small / 2.3 g  Raisin, 1.5 oz / 1.6 g  Melon, 1 cup / 1.4 g Vegetables / Dietary Fiber (g)  Green beans (canned),  cup / 1.3 g  Carrots (cooked),  cup / 2.3 g  Broccoli (cooked),  cup / 2.8 g  Peas (cooked),  cup / 4.4 g  Mashed potatoes,  cup / 1.6 g  Lettuce, 1 cup / 0.5 g  Corn (canned),  cup / 1.6 g  Tomato,  cup / 1.1 g Document Released: 11/12/2005 Document Revised: 05/13/2012 Document Reviewed: 02/14/2012 Exit[employer] Patient Information 2015 [employer] [place]. This information is not intended to replace advice given to you by your health care provider. Make sure you discuss any questions you have with your health care provider.

## 2015-01-06 NOTE — Assessment & Plan Note (Signed)
Likely induced from the constipation at this time. We discussed high fiber diet and/or fiber supplements. Check lipase, CMP, HgA1c, CBC. If any abnormalities then will pursue further imaging.

## 2015-01-06 NOTE — Progress Notes (Signed)
   Subjective:    Patient ID: Tora Perchesorothy B Monterroso, female    DOB: [birth year], 81 y.o.   MRN: [id number]  HPI The patient is an 81 YO female who has been having some stomach pain for the last week. It is located in the LLQ and throbbing off and on. About 5-6/10 in intensity but only lasts for 10-20 minutes at a time. She has not tried anything for it. Associated with some mild constipation and bloating. Denies nausea/vomiting/diarrhea. Denies fevers or chills.   Review of Systems  Constitutional: Negative for fever, chills, activity change, appetite change and fatigue.  Respiratory: Negative for cough, chest tightness, shortness of breath and wheezing.   Cardiovascular: Negative for chest pain, palpitations and leg swelling.  Gastrointestinal: Positive for abdominal pain and constipation. Negative for nausea, vomiting, diarrhea, blood in stool and abdominal distention.  Neurological: Negative.       Objective:   Physical Exam  Constitutional: She appears well-developed and well-nourished. No distress.  HENT:  Head: Normocephalic and atraumatic.  Eyes: EOM are normal.  Neck: Normal range of motion.  Cardiovascular: Normal rate and regular rhythm.   Pulmonary/Chest: Effort normal and breath sounds normal. No respiratory distress. She has no wheezes. She has no rales.  Abdominal: Soft. Bowel sounds are normal. She exhibits no distension and no mass. There is tenderness. There is no rebound and no guarding.  Minimal tenderness in the LLQ   Filed Vitals:   01/05/15 0900  BP: 122/70  Pulse: 55  Temp: 98.1 F (36.7 C)  TempSrc: Oral  Resp: 14  Height: 5\' 5"  (1.651 m)  Weight: 165 lb 1.9 oz (74.898 kg)  SpO2: 99%      Assessment & Plan:

## 2015-01-06 NOTE — Assessment & Plan Note (Signed)
Did not address fully today given acute complaint but since she did not come back for her follow up will check HgA1c today and adjust therapy as needed. She has been asked to return in 3-4 months for follow up on other aspects of her health.

## 2015-01-18 ENCOUNTER — Telehealth: Payer: Self-pay | Admitting: Cardiology

## 2015-01-18 NOTE — Telephone Encounter (Signed)
Spoke with pt, she has a discomfort and soreness in the left shoulder area. This started last week. She can reproduce the discomfort with movement in that arm. Reassurance given to the patient it is not heart related.  She is also asking about paperwork she brought to the office to be filled out. Will forward to JC, dr hochrein's nurse regarding paperwork.

## 2015-01-18 NOTE — Telephone Encounter (Signed)
Alyssa Lindsey is calling because [name] is complaining of Shoulder pain and is asking is this something that can be related to her heart . Please call    Thanks

## 2015-01-21 ENCOUNTER — Ambulatory Visit (INDEPENDENT_AMBULATORY_CARE_PROVIDER_SITE_OTHER): Payer: Medicare HMO | Admitting: Internal Medicine

## 2015-01-21 ENCOUNTER — Encounter: Payer: Self-pay | Admitting: Internal Medicine

## 2015-01-21 VITALS — BP 152/90 | HR 49 | Temp 97.9°F | Ht 65.0 in | Wt 166.5 lb

## 2015-01-21 DIAGNOSIS — M791 Myalgia, unspecified site: Secondary | ICD-10-CM

## 2015-01-21 MED ORDER — DICLOFENAC SODIUM 1 % TD GEL: 2.0000 g | Freq: Three times a day (TID) | TRANSDERMAL | Status: DC | PRN

## 2015-01-21 NOTE — Assessment & Plan Note (Signed)
No indication for further imaging, no signs of damage to the bones or ligaments of the shoulder. Unclear what exacerbated this but will trial voltaren gel for the pain.

## 2015-01-21 NOTE — Progress Notes (Signed)
   Subjective:    Patient ID: Alyssa Lindsey, female    DOB: 1929/08/24, 81 y.o.   MRN: [id number]  HPI The patient is an 81 YO female who is coming in for pain in her muscles in the upper arm. She has had it for about 1 week. No injury or inciting event. Denies any pain in the shoulder or elbow joints. Pain 5/10 and made worse with stretching. ROM not limited except some by pain. No rash or change to the skin.  Review of Systems  Constitutional: Negative for fever, activity change, appetite change, fatigue and unexpected weight change.  Musculoskeletal: Positive for myalgias. Negative for back pain and arthralgias.  Skin: Negative.   Neurological: Negative.       Objective:   Physical Exam  Constitutional: She is oriented to person, place, and time. She appears well-developed and well-nourished.  HENT:  Head: Normocephalic and atraumatic.  Eyes: EOM are normal.  Cardiovascular: Normal rate and regular rhythm.   Pulmonary/Chest: Effort normal and breath sounds normal. No respiratory distress. She has no wheezes. She has no rales.  Musculoskeletal: Normal range of motion. She exhibits tenderness.  Tenderness on the underneath of arm in the triceps region. Full ROM of the shoulder and elbow. No exacerbating maneuvers.   Neurological: She is alert and oriented to person, place, and time.  Skin: Skin is warm and dry.   Filed Vitals:   01/21/15 1406  BP: 152/90  Pulse: 49  Temp: 97.9 F (36.6 C)  TempSrc: Oral  Height: 5\' 5"  (1.651 m)  Weight: 166 lb 8 oz (75.524 kg)  SpO2: 98%      Assessment & Plan:

## 2015-01-21 NOTE — Patient Instructions (Signed)
You can keep using the heat on the arm. We have also sent in a cream called voltaren gel which you can rub where it hurts up to 3 times per day.   We do not want you to take the over the counter arthritis medicine as you are already on blood thinner called xarelto. It is okay to take tylenol for the pain only.   Call us back if you are still having problems with the arm.   Shoulder Exercises EXERCISES  RANGE OF MOTION (ROM) AND STRETCHING EXERCISES These exercises may help you when beginning to rehabilitate your injury. Your symptoms may resolve with or without further involvement from your physician, physical therapist or athletic trainer. While completing these exercises, remember:   Restoring tissue flexibility helps normal motion to return to the joints. This allows healthier, less painful movement and activity.  An effective stretch should be held for at least 30 seconds.  A stretch should never be painful. You should only feel a gentle lengthening or release in the stretched tissue. ROM - Pendulum  Bend at the waist so that your right / left arm falls away from your body. Support yourself with your opposite hand on a solid surface, such as a table or a countertop.  Your right / left arm should be perpendicular to the ground. If it is not perpendicular, you need to lean over farther. Relax the muscles in your right / left arm and shoulder as much as possible.  Gently sway your hips and trunk so they move your right / left arm without any use of your right / left shoulder muscles.  Progress your movements so that your right / left arm moves side to side, then forward and backward, and finally, both clockwise and counterclockwise.  Complete __________ repetitions in each direction. Many people use this exercise to relieve discomfort in their shoulder as well as to gain range of motion. Repeat __________ times. Complete this exercise __________ times per day. STRETCH - Flexion,  Standing  Stand with good posture. With an underhand grip on your right / left hand and an overhand grip on the opposite hand, grasp a broomstick or cane so that your hands are a little more than shoulder-width apart.  Keeping your right / left elbow straight and shoulder muscles relaxed, push the stick with your opposite hand to raise your right / left arm in front of your body and then overhead. Raise your arm until you feel a stretch in your right / left shoulder, but before you have increased shoulder pain.  Try to avoid shrugging your right / left shoulder as your arm rises by keeping your shoulder blade tucked down and toward your mid-back spine. Hold __________ seconds.  Slowly return to the starting position. Repeat __________ times. Complete this exercise __________ times per day. STRETCH - Internal Rotation  Place your right / left hand behind your back, palm-up.  Throw a towel or belt over your opposite shoulder. Grasp the towel/belt with your right / left hand.  While keeping an upright posture, gently pull up on the towel/belt until you feel a stretch in the front of your right / left shoulder.  Avoid shrugging your right / left shoulder as your arm rises by keeping your shoulder blade tucked down and toward your mid-back spine.  Hold __________. Release the stretch by lowering your opposite hand. Repeat __________ times. Complete this exercise __________ times per day. STRETCH - External Rotation and Abduction  Stagger your stance  through a doorframe. It does not matter which foot is forward.  As instructed by your physician, physical therapist or athletic trainer, place your hands:  And forearms above your head and on the door frame.  And forearms at head-height and on the door frame.  At elbow-height and on the door frame.  Keeping your head and chest upright and your stomach muscles tight to prevent over-extending your low-back, slowly shift your weight onto your  front foot until you feel a stretch across your chest and/or in the front of your shoulders.  Hold __________ seconds. Shift your weight to your back foot to release the stretch. Repeat __________ times. Complete this stretch __________ times per day.  STRENGTHENING EXERCISES  These exercises may help you when beginning to rehabilitate your injury. They may resolve your symptoms with or without further involvement from your physician, physical therapist or athletic trainer. While completing these exercises, remember:   Muscles can gain both the endurance and the strength needed for everyday activities through controlled exercises.  Complete these exercises as instructed by your physician, physical therapist or athletic trainer. Progress the resistance and repetitions only as guided.  You may experience muscle soreness or fatigue, but the pain or discomfort you are trying to eliminate should never worsen during these exercises. If this pain does worsen, stop and make certain you are following the directions exactly. If the pain is still present after adjustments, discontinue the exercise until you can discuss the trouble with your clinician.  If advised by your physician, during your recovery, avoid activity or exercises which involve actions that place your right / left hand or elbow above your head or behind your back or head. These positions stress the tissues which are trying to heal. STRENGTH - Scapular Depression and Adduction  With good posture, sit on a firm chair. Supported your arms in front of you with pillows, arm rests or a table top. Have your elbows in line with the sides of your body.  Gently draw your shoulder blades down and toward your mid-back spine. Gradually increase the tension without tensing the muscles along the top of your shoulders and the back of your neck.  Hold for __________ seconds. Slowly release the tension and relax your muscles completely before completing the  next repetition.  After you have practiced this exercise, remove the arm support and complete it in standing as well as sitting. Repeat __________ times. Complete this exercise __________ times per day.  STRENGTH - External Rotators  Secure a rubber exercise band/tubing to a fixed object so that it is at the same height as your right / left elbow when you are standing or sitting on a firm surface.  Stand or sit so that the secured exercise band/tubing is at your side that is not injured.  Bend your elbow 90 degrees. Place a folded towel or small pillow under your right / left arm so that your elbow is a few inches away from your side.  Keeping the tension on the exercise band/tubing, pull it away from your body, as if pivoting on your elbow. Be sure to keep your body steady so that the movement is only coming from your shoulder rotating.  Hold __________ seconds. Release the tension in a controlled manner as you return to the starting position. Repeat __________ times. Complete this exercise __________ times per day.  STRENGTH - Supraspinatus  Stand or sit with good posture. Grasp a __________ weight or an exercise band/tubing so that your  hand is "thumbs-up," like when you shake hands.  Slowly lift your right / left hand from your thigh into the air, traveling about 30 degrees from straight out at your side. Lift your hand to shoulder height or as far as you can without increasing any shoulder pain. Initially, many people do not lift their hands above shoulder height.  Avoid shrugging your right / left shoulder as your arm rises by keeping your shoulder blade tucked down and toward your mid-back spine.  Hold for __________ seconds. Control the descent of your hand as you slowly return to your starting position. Repeat __________ times. Complete this exercise __________ times per day.  STRENGTH - Shoulder Extensors  Secure a rubber exercise band/tubing so that it is at the height of your  shoulders when you are either standing or sitting on a firm arm-less chair.  With a thumbs-up grip, grasp an end of the band/tubing in each hand. Straighten your elbows and lift your hands straight in front of you at shoulder height. Step back away from the secured end of band/tubing until it becomes tense.  Squeezing your shoulder blades together, pull your hands down to the sides of your thighs. Do not allow your hands to go behind you.  Hold for __________ seconds. Slowly ease the tension on the band/tubing as you reverse the directions and return to the starting position. Repeat __________ times. Complete this exercise __________ times per day.  STRENGTH - Scapular Retractors  Secure a rubber exercise band/tubing so that it is at the height of your shoulders when you are either standing or sitting on a firm arm-less chair.  With a palm-down grip, grasp an end of the band/tubing in each hand. Straighten your elbows and lift your hands straight in front of you at shoulder height. Step back away from the secured end of band/tubing until it becomes tense.  Squeezing your shoulder blades together, draw your elbows back as you bend them. Keep your upper arm lifted away from your body throughout the exercise.  Hold __________ seconds. Slowly ease the tension on the band/tubing as you reverse the directions and return to the starting position. Repeat __________ times. Complete this exercise __________ times per day. STRENGTH - Scapular Depressors  Find a sturdy chair without wheels, such as a from a dining room table.  Keeping your feet on the floor, lift your bottom from the seat and lock your elbows.  Keeping your elbows straight, allow gravity to pull your body weight down. Your shoulders will rise toward your ears.  Raise your body against gravity by drawing your shoulder blades down your back, shortening the distance between your shoulders and ears. Although your feet should always  maintain contact with the floor, your feet should progressively support less body weight as you get stronger.  Hold __________ seconds. In a controlled and slow manner, lower your body weight to begin the next repetition. Repeat __________ times. Complete this exercise __________ times per day.  Document Released: 09/26/2005 Document Revised: 02/04/2012 Document Reviewed: 02/24/2009 [facility] Patient Information 2015 [place], [place]. This information is not intended to replace advice given to you by your health care provider. Make sure you discuss any questions you have with your health care provider.

## 2015-01-21 NOTE — Progress Notes (Signed)
Pre visit review using our clinic review tool, if applicable. No additional management support is needed unless otherwise documented below in the visit note. 

## 2015-02-03 ENCOUNTER — Other Ambulatory Visit: Payer: Self-pay | Admitting: Internal Medicine

## 2015-02-21 ENCOUNTER — Telehealth: Payer: Self-pay | Admitting: Cardiology

## 2015-02-21 NOTE — Telephone Encounter (Signed)
Pt wants to know if you received the papers she dropped off about  2 weeks ago. She was trying to get help with her Xarelto.

## 2015-02-21 NOTE — Telephone Encounter (Signed)
Pt paid initial deductible for Xarelto, now paying $45. Wondering if she could get it cheaper. Advised her this was probably the most reasonable cost for Xarelto. Did discuss preference of Xarelto to Coumadin - she voiced understanding.  No further q's. Pt will call if pharmacy change or refill needed.

## 2015-02-22 ENCOUNTER — Telehealth: Payer: Self-pay | Admitting: Internal Medicine

## 2015-02-22 NOTE — Telephone Encounter (Signed)
Spoke with patient and she does not need famopidine.

## 2015-02-22 NOTE — Telephone Encounter (Signed)
Need to a prescription refill for famopidine. I did not see this prescription on her med list. Please advise patient.

## 2015-02-22 NOTE — Telephone Encounter (Signed)
She is taking the protonix instead now. This should be enough, is she still having symptoms?

## 2015-02-23 ENCOUNTER — Other Ambulatory Visit: Payer: Self-pay | Admitting: Geriatric Medicine

## 2015-03-02 ENCOUNTER — Ambulatory Visit (INDEPENDENT_AMBULATORY_CARE_PROVIDER_SITE_OTHER): Payer: Medicare HMO | Admitting: Family

## 2015-03-02 ENCOUNTER — Encounter: Payer: Self-pay | Admitting: Family

## 2015-03-02 VITALS — BP 140/82 | HR 58 | Temp 97.7°F | Resp 18 | Wt 166.8 lb

## 2015-03-02 DIAGNOSIS — K219 Gastro-esophageal reflux disease without esophagitis: Secondary | ICD-10-CM | POA: Diagnosis not present

## 2015-03-02 DIAGNOSIS — Z760 Encounter for issue of repeat prescription: Secondary | ICD-10-CM | POA: Insufficient documentation

## 2015-03-02 MED ORDER — LISINOPRIL-HYDROCHLOROTHIAZIDE 20-25 MG PO TABS: 1.0000 | ORAL_TABLET | Freq: Every day | ORAL | Status: DC

## 2015-03-02 MED ORDER — FAMOTIDINE 20 MG PO TABS: 20.0000 mg | ORAL_TABLET | Freq: Two times a day (BID) | ORAL | Status: DC

## 2015-03-02 MED ORDER — METFORMIN HCL 500 MG PO TABS: 500.0000 mg | ORAL_TABLET | Freq: Two times a day (BID) | ORAL | Status: DC

## 2015-03-02 NOTE — Patient Instructions (Addendum)
Thank you for choosing [facility].  Summary/Instructions:  Please STOP taking the PANTOPRAZOLE and RESTART taking the FAMOTIDINE.  Your prescription(s) have been submitted to your pharmacy or been printed and provided for you. Please take as directed and contact our office if you believe you are having problem(s) with the medication(s) or have any questions.  If your symptoms worsen or fail to improve, please contact our office for further instruction, or in case of emergency go directly to the emergency room at the closest medical facility.

## 2015-03-02 NOTE — Progress Notes (Signed)
   Subjective:    Patient ID: Alyssa Lindsey, female    DOB: 06/25/1929, 81 y.o.   MRN: [id number]  Chief Complaint  Patient presents with  . medication follow up    fomoditine and refills on other medication    HPI:  Alyssa Lindsey is a 81 y.o. female who presents today for an office visit.   1) Stomach - Associated symptom of stomach not feeling right and feeling sour has been going on for about a week since she ran out of the famotidine. Was recently changed in February from famotidine to protonix. Patient is slightly confused as to which medication to take, however indicates she was taking the protonix and still experiencing symptoms.   2) Medication refills - Patient is requesting medication refills for several medications.   Allergies  Allergen Reactions  . Dicyclomine Hcl Other (See Comments)    Severe eye pain  . Aspirin Other (See Comments)    Unknown reaction    Current Outpatient Prescriptions on File Prior to Visit  Medication Sig Dispense Refill  . amLODipine (NORVASC) 5 MG tablet Take 1 tablet (5 mg total) by mouth daily. 30 tablet 5  . diclofenac sodium (VOLTAREN) 1 % GEL Apply 2 g topically 3 (three) times daily as needed. 100 g 2  . lisinopril-hydrochlorothiazide (PRINZIDE,ZESTORETIC) 20-25 MG per tablet TAKE 1 TABLET BY MOUTH EVERY DAY 90 tablet 3  . meloxicam (MOBIC) 15 MG tablet Take 1 tablet (15 mg total) by mouth daily. 30 tablet 1  . metFORMIN (GLUCOPHAGE) 500 MG tablet TAKE ONE TABLET BY MOUTH TWICE DAILY WITH FOOD 60 tablet 0  . metoprolol tartrate (LOPRESSOR) 25 MG tablet Take 0.5 tablets (12.5 mg total) by mouth 2 (two) times daily. 60 tablet 11  . pantoprazole (PROTONIX) 40 MG tablet Take 1 tablet (40 mg total) by mouth daily. 30 tablet 3  . rivaroxaban (XARELTO) 20 MG TABS tablet Take 1 tablet (20 mg total) by mouth daily with supper. 30 tablet 11   No current facility-administered medications on file prior to visit.    Review of Systems    Constitutional: Negative for fever and chills.  Gastrointestinal: Negative for nausea, vomiting and abdominal pain.      Objective:    BP 140/82 mmHg  Pulse 58  Temp(Src) 97.7 F (36.5 C) (Oral)  Resp 18  Wt 166 lb 12.8 oz (75.66 kg)  SpO2 97% Nursing note and vital signs reviewed.  Physical Exam  Constitutional: She is oriented to person, place, and time. She appears well-developed and well-nourished. No distress.  Cardiovascular: Normal rate, regular rhythm, normal heart sounds and intact distal pulses.   Pulmonary/Chest: Effort normal and breath sounds normal.  Abdominal: Soft. Bowel sounds are normal. She exhibits no distension and no mass. There is no tenderness. There is no rebound and no guarding.  Neurological: She is alert and oriented to person, place, and time.  Skin: Skin is warm and dry.  Psychiatric: She has a normal mood and affect. Her behavior is normal. Judgment and thought content normal.       Assessment & Plan:

## 2015-03-02 NOTE — Assessment & Plan Note (Signed)
Symptoms consistent with GERD. Previously on famotidine and changed to Protonix. Patient noted continued symptoms with new medication. Discontinue Protonix. Restart famotidine. Follow-up if symptoms worsen or fail to improve.

## 2015-03-02 NOTE — Assessment & Plan Note (Signed)
Refilled metformin and lisinopril-hydrochlorothiazide. Patient requested refill of meloxicam. Given history of atrial fibrillation and need for Xarelto, medication not refilled at this time. Advised to contact PCP for further meloxicam refill if appropriate.

## 2015-03-02 NOTE — Progress Notes (Signed)
Pre visit review using our clinic review tool, if applicable. No additional management support is needed unless otherwise documented below in the visit note. 

## 2015-03-07 ENCOUNTER — Telehealth: Payer: Self-pay

## 2015-03-07 ENCOUNTER — Telehealth: Payer: Self-pay | Admitting: Cardiology

## 2015-03-07 NOTE — Telephone Encounter (Signed)
Patient aware samples are at the front desk for pick up  

## 2015-03-07 NOTE — Telephone Encounter (Signed)
Pt would like some samples of Xarelto please. °

## 2015-03-07 NOTE — Telephone Encounter (Signed)
Call rec'd from the patient. States she would be willing to come in but would like to schedule an apt when her dtr comes in to see the doctor. The dtr declined to sch today; The patient will plan to come in for wellness visit when her dtr schedule's her fup apt.

## 2015-03-07 NOTE — Telephone Encounter (Signed)
Patient called to educate on Medicare Wellness apt. LVM for the patient to call back to educate and schedule for wellness visit.   

## 2015-03-17 ENCOUNTER — Telehealth: Payer: Self-pay | Admitting: Cardiology

## 2015-03-17 NOTE — Telephone Encounter (Signed)
Spoke with patient and let her know that have some samples for her but also an assistance paper for her to sign for Eliquis. Patient voiced understanding.

## 2015-03-17 NOTE — Telephone Encounter (Signed)
Patient call requesting samples for Xarelto and to let us know that they denied her request for Xarelto.  I then ask [employer] if they had samples and that the company denied her so [employer] will handle from here.

## 2015-03-23 ENCOUNTER — Telehealth: Payer: Self-pay | Admitting: *Deleted

## 2015-03-23 NOTE — Telephone Encounter (Signed)
Called to inform patient that Alyssa Lindsey looked over the patient assistance information and it seems that the reason for the high co-pay for her Xarelto is that she must meet a deductible through insurance first. Patient was instructed to call her insurance and speak with them concerning cost.

## 2015-03-24 ENCOUNTER — Encounter: Payer: Self-pay | Admitting: Internal Medicine

## 2015-03-24 ENCOUNTER — Ambulatory Visit (INDEPENDENT_AMBULATORY_CARE_PROVIDER_SITE_OTHER): Payer: Medicare HMO | Admitting: Internal Medicine

## 2015-03-24 VITALS — BP 130/72 | HR 55 | Temp 98.5°F | Resp 16 | Ht 65.0 in | Wt 164.0 lb

## 2015-03-24 DIAGNOSIS — K219 Gastro-esophageal reflux disease without esophagitis: Secondary | ICD-10-CM

## 2015-03-24 DIAGNOSIS — Z23 Encounter for immunization: Secondary | ICD-10-CM

## 2015-03-24 MED ORDER — METFORMIN HCL 500 MG PO TABS: 500.0000 mg | ORAL_TABLET | Freq: Every day | ORAL | Status: DC

## 2015-03-24 NOTE — Progress Notes (Signed)
Pre visit review using our clinic review tool, if applicable. No additional management support is needed unless otherwise documented below in the visit note. 

## 2015-03-24 NOTE — Patient Instructions (Signed)
We will have you stop taking the meloxicam as it can hurt the stomach. We would like you to take the metformin only once a day and see if that does better with your stomach. If it is still having problems call the office and we may stop it entirely since your sugars are doing so well.   We will not check the blood work today but will see you back in about 3 months and check the blood work then.

## 2015-03-27 NOTE — Assessment & Plan Note (Signed)
She has been back on her famotidine for about 2 weeks. Stop meloxicam (had started for muscle pain brief course). No NSAIDS and give her 1-2 months to see if the metallic taste disappears. If not may need evaluation from ENT.

## 2015-03-27 NOTE — Progress Notes (Signed)
   Subjective:    Patient ID: Alyssa Lindsey, female    DOB: 01-10-1929, 81 y.o.   MRN: [id number]  HPI The patient is an 81 YO female who is coming in to follow up on her acid reflux. She has gone back on the famotidine which previously had done better (from protonix). She is feeling some pain in her stomach still. Also has the metallic taste in her mouth still. Denies vomiting, dark stools. Denies bleeding or blood in stool.   Review of Systems  Constitutional: Negative for fever, activity change, appetite change, fatigue and unexpected weight change.  Gastrointestinal: Positive for abdominal pain. Negative for vomiting, diarrhea, constipation, blood in stool and abdominal distention.  Musculoskeletal: Positive for myalgias. Negative for back pain and arthralgias.  Skin: Negative.       Objective:   Physical Exam  Constitutional: She is oriented to person, place, and time. She appears well-developed and well-nourished.  HENT:  Head: Normocephalic and atraumatic.  Eyes: EOM are normal.  Cardiovascular: Normal rate and regular rhythm.   Pulmonary/Chest: Effort normal and breath sounds normal. No respiratory distress. She has no wheezes. She has no rales.  Abdominal: Soft. She exhibits no distension. There is no tenderness. There is no rebound.  Musculoskeletal: She exhibits no edema.  Neurological: She is alert and oriented to person, place, and time.  Skin: Skin is warm and dry.   Filed Vitals:   03/24/15 1031  BP: 130/72  Pulse: 55  Temp: 98.5 F (36.9 C)  TempSrc: Oral  Resp: 16  Height: 5\' 5"  (1.651 m)  Weight: 164 lb (74.39 kg)  SpO2: 98%      Assessment & Plan:

## 2015-03-29 NOTE — Telephone Encounter (Signed)
Call to the patient and still waiting on dtr to bring her in for apt, but apt planned or 7/28; fup with Dr Dorise HissKollar at 8:45 and agreed to have the wellness visit at [facility] following visit with Dr.Kollar.

## 2015-04-16 ENCOUNTER — Other Ambulatory Visit: Payer: Self-pay | Admitting: Family

## 2015-05-04 ENCOUNTER — Other Ambulatory Visit: Payer: Self-pay | Admitting: Family

## 2015-05-06 ENCOUNTER — Telehealth: Payer: Self-pay | Admitting: Cardiology

## 2015-05-06 NOTE — Telephone Encounter (Signed)
Pt called in wanting to know if there are any samples for Xarelto that she can have. Please call back  Thanks

## 2015-05-06 NOTE — Telephone Encounter (Signed)
Medication samples have been provided to the patient.  Drug name: xarelto 55  Qty: 25  LOT: [id number]  Exp.Date: 04/2017  Samples left at front desk for patient pick-up. Patient notified.  Julaine Fusi M 11:17 AM 05/06/2015

## 2015-05-13 ENCOUNTER — Telehealth: Payer: Self-pay

## 2015-05-13 NOTE — Telephone Encounter (Signed)
Call to the patient to educate on AWV and seen that apt was scheduled during her next apt with Dr. Dorise Hiss on 7/28.

## 2015-05-18 ENCOUNTER — Telehealth: Payer: Self-pay | Admitting: Internal Medicine

## 2015-05-18 ENCOUNTER — Other Ambulatory Visit: Payer: Self-pay | Admitting: Geriatric Medicine

## 2015-05-18 MED ORDER — FAMOTIDINE 20 MG PO TABS: 20.0000 mg | ORAL_TABLET | Freq: Two times a day (BID) | ORAL | Status: DC

## 2015-05-18 NOTE — Telephone Encounter (Signed)
Sent to pharamacy

## 2015-05-18 NOTE — Telephone Encounter (Signed)
Patient is requesting refill for famotidine (PEPCID) 20 MG tablet [[id number]. Pharmacy is [facility] [address].

## 2015-05-23 ENCOUNTER — Other Ambulatory Visit: Payer: Self-pay

## 2015-06-03 ENCOUNTER — Telehealth: Payer: Self-pay | Admitting: Cardiology

## 2015-06-03 ENCOUNTER — Telehealth: Payer: Self-pay | Admitting: *Deleted

## 2015-06-03 NOTE — Telephone Encounter (Signed)
Patient calling the office for samples of medication: ° ° °1.  What medication and dosage are you requesting samples for? Xarelto  ° °2.  Are you currently out of this medication? Yes  ° °3. Are you requesting samples to get you through until a mail order prescription arrives? No  ° °

## 2015-06-03 NOTE — Telephone Encounter (Signed)
Xarelto samples placed at the front desk. 

## 2015-06-03 NOTE — Telephone Encounter (Signed)
Left message to inform pt we are out of samples of this med.

## 2015-06-14 ENCOUNTER — Telehealth: Payer: Self-pay

## 2015-06-14 NOTE — Telephone Encounter (Signed)
Call to the patient and did confirm she can come in early at 8:15 [facility]

## 2015-06-23 ENCOUNTER — Ambulatory Visit (INDEPENDENT_AMBULATORY_CARE_PROVIDER_SITE_OTHER): Payer: Medicare HMO | Admitting: Internal Medicine

## 2015-06-23 ENCOUNTER — Other Ambulatory Visit: Payer: Self-pay | Admitting: Cardiology

## 2015-06-23 ENCOUNTER — Other Ambulatory Visit (INDEPENDENT_AMBULATORY_CARE_PROVIDER_SITE_OTHER): Payer: Medicare HMO

## 2015-06-23 ENCOUNTER — Encounter: Payer: Self-pay | Admitting: Internal Medicine

## 2015-06-23 ENCOUNTER — Ambulatory Visit: Payer: Medicare HMO

## 2015-06-23 VITALS — BP 170/80 | HR 47 | Temp 97.9°F | Resp 16 | Ht 65.0 in | Wt 161.2 lb

## 2015-06-23 DIAGNOSIS — I10 Essential (primary) hypertension: Secondary | ICD-10-CM

## 2015-06-23 DIAGNOSIS — Z1231 Encounter for screening mammogram for malignant neoplasm of breast: Secondary | ICD-10-CM | POA: Diagnosis not present

## 2015-06-23 DIAGNOSIS — Z0000 Encounter for general adult medical examination without abnormal findings: Secondary | ICD-10-CM | POA: Diagnosis not present

## 2015-06-23 DIAGNOSIS — E119 Type 2 diabetes mellitus without complications: Secondary | ICD-10-CM

## 2015-06-23 LAB — COMPREHENSIVE METABOLIC PANEL
ALK PHOS: 69 U/L (ref 39–117)
ALT: 6 U/L (ref 0–35)
AST: 12 U/L (ref 0–37)
Albumin: 4.1 g/dL (ref 3.5–5.2)
BUN: 12 mg/dL (ref 6–23)
CALCIUM: 9.7 mg/dL (ref 8.4–10.5)
CO2: 29 meq/L (ref 19–32)
CREATININE: 1.05 mg/dL (ref 0.40–1.20)
Chloride: 107 mEq/L (ref 96–112)
GFR: 64.18 mL/min (ref >60.00)
Glucose, Bld: 103 mg/dL — ABNORMAL HIGH (ref 70–99)
Potassium: 4.5 mEq/L (ref 3.5–5.1)
Sodium: 142 mEq/L (ref 135–145)
Total Bilirubin: 0.6 mg/dL (ref 0.2–1.2)
Total Protein: 7.4 g/dL (ref 6.0–8.3)

## 2015-06-23 LAB — HEMOGLOBIN A1C: Hgb A1c MFr Bld: 6.1 % (ref 4.6–6.5)

## 2015-06-23 MED ORDER — APIXABAN 5 MG PO TABS: 5.0000 mg | ORAL_TABLET | Freq: Two times a day (BID) | ORAL | Status: DC

## 2015-06-23 NOTE — Telephone Encounter (Signed)
Rx has been sent to the pharmacy electronically. ° °

## 2015-06-23 NOTE — Patient Instructions (Addendum)
We have sent in a medicine called eliquis which is similar to the xarelto. This one is a medicine you have to take 1 pill twice a day. We will see if the insurance covers it.    Alyssa Lindsey , Thank you for taking time to come for your Medicare Wellness Visit. I appreciate your ongoing commitment to your health goals. Please review the following plan we discussed and let me know if I can assist you in the future.   Will order mammogram at [facility] and they will call and schedule  Needs eye exam when possible   These are the goals we discussed: Goals    . maintain     Maintain health and exercise every day       This is a list of the screening recommended for you and due dates:  Health Maintenance  Topic Date Due  . Eye exam for diabetics  04/04/1941  . Shingles Vaccine  04/05/1991  . Complete foot exam   06/10/2010  . Flu Shot  06/27/2015  . Hemoglobin A1C  07/06/2015  . Urine Protein Check  07/31/2015  . Colon Cancer Screening  09/13/2019  . Tetanus Vaccine  03/23/2025  . DEXA scan (bone density measurement)  Completed  . Pneumonia vaccines  Completed

## 2015-06-23 NOTE — Progress Notes (Signed)
Medical screening examination/treatment/procedure(s) were performed by non-physician practitioner and as supervising physician I was immediately available for consultation/collaboration. I agree with above. Kollar, Tammye Kahler A, MD   

## 2015-06-23 NOTE — Progress Notes (Signed)
Subjective:   Alyssa Lindsey is a 82 y.o. female who presents for Medicare Annual (Subsequent) preventive examination.  Review of Systems:   HRA assessment completed during visit;  Patient is here for Annual Wellness Assessment The Patient was informed that this wellness visit is to identify risk and educate on how to reduce risk for increase disease through lifestyle changes.  The patient verbalized understanding that any voiced medical issues still need to be directed to their physician at a follow up visit as this is not a "physical exam" in which medical issues are addressed and treated.  Personalized education given regarding risk on the problem list that are currently being medically managed;  Lifestyle changes reviewed for HTN; DM2 several years;  ? Memory issues; will ask d/a dtr questions; as when do we eat, when did my spouse pass? Etc. Deines memory issues but dependent on dtr; MMSE 20 today Discussed advanced directives and given information to discuss with her family A1c 6.3 in Feb;   BMI 26.8  Diet: doesn't eat out much; eat 2 meals; eats whenever we get up; eggs, bacon, grits; Beans, potatoes, vegetables; little bit of meat 3 dtrs and one son; Dtrs live nearby; spouse passed x 2 yo Everyone comes by on the weekend  Driving; does not drive; states she has never driven; Scat today   No pain but states sometimes her stomach "feels funny" hurting. Better now   Exercise: cleans house and walks, trim up the yard/  Estimated exercise of 5 days for 45 minutes as the patient states she is busy and very seldom sits down Falls:  no falls Safety; Good neighbors;   Screenings overdue:  Ophthalmology exam; 2 years ago;  Foot exam  deferred to Dr. Dorise Hiss Immunizations up to date Tetanus due April 2026 Shingles educated to check with insurer;  Colonoscopy; aged out Mammogram; 2011 Dexa: completed 2007 EKG 11/01/2014 Hearing: 2000hz  both ears Dental: not lately;  [place] [employer] to get cleaned  Current Care Team reviewed and updated Cardiac Risk Factors include: advanced age (>86men, >14 women);diabetes mellitus     Objective:     Vitals: BP 150/70 mmHg  Pulse 47  Temp(Src) 97.9 F (36.6 C) (Oral)  Resp 16  Ht 5\' 5"  (1.651 m)  Wt 161 lb 4 oz (73.143 kg)  BMI 26.83 kg/m2  SpO2 97%  Tobacco History  Smoking status  . Never Smoker   Smokeless tobacco  . Never Used     Counseling given: Yes   Past Medical History  Diagnosis Date  . Type II or unspecified type diabetes mellitus without mention of complication, not stated as uncontrolled   . HTN (hypertension)   . LBP (low back pain)   . Chronic neck pain   . Spondylosis   . Arthritis   . Diverticulosis of colon (without mention of hemorrhage)   . Hiatal hernia   . Gastritis   . Family history of malignant neoplasm of gastrointestinal tract   . Memory problem    Past Surgical History  Procedure Laterality Date  . Tubal ligation    . Colonoscopy  2010    NORMAL-- DUE 2020   Family History  Problem Relation Age of Onset  . Stroke Father 78  . Ovarian cancer Sister   . Breast cancer Sister   . Breast cancer Sister   . Liver disease Brother   . Colon cancer Daughter   . Diabetes Daughter   . Throat cancer Brother   .  Emphysema Brother   . Lung cancer Daughter    History  Sexual Activity  . Sexual Activity: Not on file    Outpatient Encounter Prescriptions as of 06/23/2015  Medication Sig  . amLODipine (NORVASC) 5 MG tablet Take 1 tablet (5 mg total) by mouth daily.  . diclofenac sodium (VOLTAREN) 1 % GEL Apply 2 g topically 3 (three) times daily as needed.  . famotidine (PEPCID) 20 MG tablet Take 1 tablet (20 mg total) by mouth 2 (two) times daily.  [name] lisinopril-hydrochlorothiazide (PRINZIDE,ZESTORETIC) 20-25 MG per tablet TAKE ONE TABLET BY MOUTH ONCE DAILY  . metFORMIN (GLUCOPHAGE) 500 MG tablet Take 1 tablet (500 mg total) by mouth daily with breakfast.  .  metoprolol tartrate (LOPRESSOR) 25 MG tablet Take 0.5 tablets (12.5 mg total) by mouth 2 (two) times daily.  . rivaroxaban (XARELTO) 20 MG TABS tablet Take 1 tablet (20 mg total) by mouth daily with supper.   No facility-administered encounter medications on file as of 06/23/2015.    Activities of Daily Living In your present state of health, do you have any difficulty performing the following activities: 06/23/2015  Hearing? N  Vision? N  Difficulty concentrating or making decisions? Y  Walking or climbing stairs? N  Dressing or bathing? N  Doing errands, shopping? N  Preparing Food and eating ? N  Using the Toilet? N  In the past six months, have you accidently leaked urine? N  Do you have problems with loss of bowel control? N  Managing your Medications? N  Managing your Finances? N  Housekeeping or managing your Housekeeping? N    Patient Care Team: Judie Bonus, MD as PCP - General (Internal Medicine)    Assessment:    Objective:   The goal of the wellness visit is to assist the patient how to close the gaps in care and create a preventative care plan for the patient.  Personalized Education was given regarding:   Pt determined a personalized goal; see patient goals;  Assessment included: Bone density scan as appropriate was not due  Taking meds without issues; no barriers identified/stated no problem but dtr stated she cannot afford the xarelto. Ordered by Dr. Antoine Poche as this medicine cost her over 200.00 and she cannot take it.Taking samples now and running low.  Labs were and fup visit noted with MD if labs are due to be re-drawn.  Stress: Recommendations for managing stress if assessed as a factor;   No Risk for hepatitis or high risk social behavior identified via hepatitis screen  Educated on shingles and follow up with insurance company for co-pays or charges applied to Part D benefit. Educated on Vaccines;  Safety issues reviewed;   MMSE completed  and score was 20 today; somwhat dependent on dtr to answer questions.   Depression screen negative;   Functional status reviewed; no losses in function x 1 year  Bowel and bladder issues assessed  End of life planning was discussed; aging in home or other; plans to complete HCPOA were discussed if not completed    Exercise Activities and Dietary recommendations Current Exercise Habits:: Home exercise routine, Type of exercise: walking;Other - see comments, Time (Minutes): 45 (yard work; up and down all day), Frequency (Times/Week): 5, Weekly Exercise (Minutes/Week): 225  Goals    . maintain     Maintain health and exercise every day      Fall Risk Fall Risk  06/23/2015 03/04/2014  Falls in the past year? No No  Depression Screen PHQ 2/9 Scores 06/23/2015 03/04/2014  PHQ - 2 Score 0 0     Cognitive Testing MMSE - Mini Mental State Exam 06/23/2015  Orientation to time 4  Orientation to Place 4  Registration 3  Attention/ Calculation 0  Recall 2  Language- name 2 objects 2  Language- repeat 1  Language- follow 3 step command 3  Language- read & follow direction 0  Write a sentence 1  Copy design 0  Total score 20    Immunization History  Administered Date(s) Administered  . Influenza Whole 09/07/2005, 09/16/2008  . Influenza,inj,Quad PF,36+ Mos 08/31/2013, 09/02/2014  . Pneumococcal Conjugate-13 03/24/2015  . Pneumococcal Polysaccharide-23 10/30/2006  . Td 05/25/1996, 03/02/2002  . Tdap 03/24/2015   Screening Tests Health Maintenance  Topic Date Due  . OPHTHALMOLOGY EXAM  04/04/1941  . ZOSTAVAX  04/05/1991  . FOOT EXAM  06/10/2010  . INFLUENZA VACCINE  06/27/2015  . HEMOGLOBIN A1C  07/06/2015  . URINE MICROALBUMIN  07/31/2015  . COLONOSCOPY  09/13/2019  . TETANUS/TDAP  03/23/2025  . DEXA SCAN  Completed  . PNA vac Low Risk Adult  Completed      Plan:   1. Taking meds without issues; no barriers identified/stated no problem but dtr stated she cannot  afford the xarelto. Ordered by Dr. Antoine Poche as this medicine cost her over 200.00 and she cannot take it.Taking samples now and running low.  2[facility]  3. Given information and AD form and encouraged to meet with family and make these decisions in writing.  4. Needs eye exam  During the course of the visit the patient was educated and counseled about the following appropriate screening and preventive services:   Vaccines to include Pneumoccal, Influenza, Hepatitis B, Td, Zostavax, HCV  Electrocardiogram/ rate 47 asymptomatic  Cardiovascular Disease/ no chest pain/ no sob  Colorectal cancer screening / aged out  Bone density screening;   Diabetes screening / dr. Dorise Hiss  Glaucoma screening/ needs eye exam  Mammography/PAP/ Scheduled for mammogram  Nutrition counseling   Patient Instructions (the written plan) was given to the patient.   Montine Circle, RN  06/23/2015

## 2015-06-23 NOTE — Progress Notes (Signed)
   Subjective:    Patient ID: Alyssa Lindsey, female    DOB: 07-04-1929, 81 y.o.   MRN: [id number]  HPI The patient is an 81 YO female who is coming in for follow up of her diabetes. She is doing well and taking the metformin. Denies any numbness or burning in her feet. No low sugars. She is working on exercising some daily but does not always do that. She has had the diabetes for many years. Denies new symptoms such as chest pains, SOB.   Review of Systems  Constitutional: Negative for fever, chills, activity change, appetite change and fatigue.  Respiratory: Negative for cough, chest tightness, shortness of breath and wheezing.   Cardiovascular: Negative for chest pain, palpitations and leg swelling.  Gastrointestinal: Negative for nausea, vomiting, abdominal pain, diarrhea, blood in stool and abdominal distention.  Musculoskeletal: Negative.   Skin: Negative.   Neurological: Negative.       Objective:   Physical Exam  Constitutional: She appears well-developed and well-nourished. No distress.  HENT:  Head: Normocephalic and atraumatic.  Eyes: EOM are normal.  Neck: Normal range of motion.  Cardiovascular: Normal rate and regular rhythm.   Pulmonary/Chest: Effort normal and breath sounds normal. No respiratory distress. She has no wheezes. She has no rales.  Abdominal: Soft. Bowel sounds are normal. She exhibits no distension and no mass. There is no rebound and no guarding.   Filed Vitals:   06/23/15 0805 06/23/15 0845  BP: 150/70 170/80  Pulse: 47   Temp: 97.9 F (36.6 C)   TempSrc: Oral   Resp: 16   Height:  (1.651 m)   Weight: 161 lb 4 oz (73.143 kg)   SpO2: 97%       Assessment & Plan:

## 2015-06-23 NOTE — Assessment & Plan Note (Signed)
Did not take meds this morning due to concerns about frequent urination. Last time at goal on meds. Will follow and evaluate at next visit. Continue amlodipine, lisinopril/hctz and metoprolol. HR high 40s without symptoms and generally in the 50s at home.

## 2015-06-23 NOTE — Progress Notes (Signed)
Pre visit review using our clinic review tool, if applicable. No additional management support is needed unless otherwise documented below in the visit note. 

## 2015-06-23 NOTE — Assessment & Plan Note (Signed)
Checking HgA1c today, last 6.3. Taking metformin 500 mg daily. Also on ACE-I. No signs of complications at this time. Foot exam done today.

## 2015-07-11 ENCOUNTER — Telehealth: Payer: Self-pay | Admitting: Cardiology

## 2015-07-11 MED ORDER — RIVAROXABAN 20 MG PO TABS: 20.0000 mg | ORAL_TABLET | Freq: Every day | ORAL | Status: DC

## 2015-07-11 NOTE — Telephone Encounter (Signed)
Pt informed no samples available. Asked for preference of pharmacy for refill. Refill submitted to patient's preferred pharmacy. Informed patient. Pt voiced understanding, no other stated concerns at this time.

## 2015-07-11 NOTE — Telephone Encounter (Signed)
Patient calling the office for samples of medication: ° ° °1.  What medication and dosage are you requesting samples for? Xarelto  ° °2.  Are you currently out of this medication? Yes  ° °3. Are you requesting samples to get you through until a mail order prescription arrives? No  ° °

## 2015-07-12 ENCOUNTER — Telehealth: Payer: Self-pay | Admitting: Cardiology

## 2015-07-12 NOTE — Telephone Encounter (Signed)
Patient given 5(5)bottles of Xarelto 20 mg tablet sample  Patient said that she can't afford them

## 2015-07-12 NOTE — Telephone Encounter (Signed)
Patient is out of Xarelto--do we have any samples?

## 2015-08-08 ENCOUNTER — Encounter: Payer: Self-pay | Admitting: Internal Medicine

## 2015-08-08 ENCOUNTER — Ambulatory Visit (INDEPENDENT_AMBULATORY_CARE_PROVIDER_SITE_OTHER): Payer: Medicare HMO | Admitting: Internal Medicine

## 2015-08-08 VITALS — BP 164/82 | HR 52 | Temp 98.7°F | Resp 12 | Ht 65.0 in | Wt 161.0 lb

## 2015-08-08 DIAGNOSIS — K149 Disease of tongue, unspecified: Secondary | ICD-10-CM

## 2015-08-08 MED ORDER — CETIRIZINE HCL 10 MG PO TABS: 10.0000 mg | ORAL_TABLET | Freq: Every day | ORAL | Status: DC

## 2015-08-08 NOTE — Patient Instructions (Signed)
We would like you to start taking zyrtec (cetirizine) 1 pill daily. Give it about 3 days to kick in and if you are still feeling like the throat and tongue are not getting back to normal call us.

## 2015-08-08 NOTE — Assessment & Plan Note (Signed)
Do not see any rash on the tongue or in the mouth, no severe dental disease. No new food exposures or medicine exposures. Will try zyrtec, no indication for steroids. If any trouble with swallowing or breathing she will seek emergency care. She will monitor closely and if no improvement refer to ENT.

## 2015-08-08 NOTE — Progress Notes (Signed)
   Subjective:    Patient ID: Alyssa Lindsey, female    DOB: 10/23/1929, 81 y.o.   MRN: [id number]  HPI The patient is an 35 female coming in for sensation of swelling of her tongue. It started last Thursday, not eating anything new. No feeling or swelling in her face or throat. No breathing problems. Tongue feels kind of dry. Has not impacted her eating or drinking. No nausea or vomiting. Some mild sinus congestion. No fevers or chills, no known food allergies. No change to her medicine and no new medicines. Did not take anything OTC. Has not changed since that time. Has not tried anything for it. Never happened before. Family member who lives with her is not seeing and cannot tell if they have noticed a change.  Review of Systems  Constitutional: Negative for fever, chills, activity change, appetite change, fatigue and unexpected weight change.  HENT: Positive for congestion. Negative for dental problem, drooling, ear discharge, ear pain, facial swelling, rhinorrhea, sinus pressure, sore throat, trouble swallowing and voice change.        Tongue slightly larger  Eyes: Negative.   Respiratory: Negative for apnea, cough, choking, chest tightness, shortness of breath, wheezing and stridor.   Cardiovascular: Negative for chest pain, palpitations and leg swelling.  Gastrointestinal: Negative for nausea, vomiting, abdominal pain, diarrhea, constipation and abdominal distention.      Objective:   Physical Exam  Constitutional: She is oriented to person, place, and time. She appears well-developed and well-nourished.  HENT:  Head: Normocephalic and atraumatic.  Right Ear: External ear normal.  Left Ear: External ear normal.  Oropharynx with mild clear drainage at the back of the throat. Tongue without scalloping. Some mild indentations around the teeth. No rash or signs of oral infection in the mouth.  Eyes: EOM are normal. Pupils are equal, round, and reactive to light.  Neck: Normal range of  motion. Neck supple. No JVD present. No tracheal deviation present. No thyromegaly present.  Cardiovascular: Normal rate and regular rhythm.   Pulmonary/Chest: Effort normal and breath sounds normal. No stridor. No respiratory distress. She has no wheezes. She has no rales.  Abdominal: Soft. Bowel sounds are normal. She exhibits no distension. There is no tenderness. There is no rebound.  Lymphadenopathy:    She has no cervical adenopathy.  Neurological: She is alert and oriented to person, place, and time.  Skin: Skin is warm and dry.   Filed Vitals:   08/08/15 0801  BP: 164/82  Pulse: 52  Temp: 98.7 F (37.1 C)  TempSrc: Oral  Resp: 12  Height:  (1.651 m)  Weight: 161 lb (73.029 kg)  SpO2: 97%      Assessment & Plan:

## 2015-08-08 NOTE — Progress Notes (Signed)
Pre visit review using our clinic review tool, if applicable. No additional management support is needed unless otherwise documented below in the visit note. 

## 2015-08-15 ENCOUNTER — Encounter: Payer: Self-pay | Admitting: Internal Medicine

## 2015-08-15 ENCOUNTER — Ambulatory Visit (INDEPENDENT_AMBULATORY_CARE_PROVIDER_SITE_OTHER): Payer: Medicare HMO | Admitting: Internal Medicine

## 2015-08-15 VITALS — BP 136/78 | HR 57 | Temp 98.5°F | Resp 18 | Ht 65.0 in | Wt 162.1 lb

## 2015-08-15 DIAGNOSIS — M545 Low back pain, unspecified: Secondary | ICD-10-CM

## 2015-08-15 DIAGNOSIS — M25552 Pain in left hip: Secondary | ICD-10-CM

## 2015-08-15 DIAGNOSIS — M25551 Pain in right hip: Secondary | ICD-10-CM

## 2015-08-15 MED ORDER — METHYLPREDNISOLONE ACETATE 40 MG/ML IJ SUSP
40.0000 mg | Freq: Once | INTRAMUSCULAR | Status: AC
  Administered 2015-08-15: 40 mg via INTRAMUSCULAR

## 2015-08-15 NOTE — Patient Instructions (Signed)
We have given you the steroid injection today which should help with the pain.   I would recommend icing the backside for 15 minutes when you get home to help with the pain.  Back Exercises These exercises may help you when beginning to rehabilitate your injury. Your symptoms may resolve with or without further involvement from your physician, physical therapist or athletic trainer. While completing these exercises, remember:   Restoring tissue flexibility helps normal motion to return to the joints. This allows healthier, less painful movement and activity.  An effective stretch should be held for at least 30 seconds.  A stretch should never be painful. You should only feel a gentle lengthening or release in the stretched tissue. STRETCH - Extension, Prone on Elbows   Lie on your stomach on the floor, a bed will be too soft. Place your palms about shoulder width apart and at the height of your head.  Place your elbows under your shoulders. If this is too painful, stack pillows under your chest.  Allow your body to relax so that your hips drop lower and make contact more completely with the floor.  Hold this position for __________ seconds.  Slowly return to lying flat on the floor. Repeat __________ times. Complete this exercise __________ times per day.  RANGE OF MOTION - Extension, Prone Press Ups   Lie on your stomach on the floor, a bed will be too soft. Place your palms about shoulder width apart and at the height of your head.  Keeping your back as relaxed as possible, slowly straighten your elbows while keeping your hips on the floor. You may adjust the placement of your hands to maximize your comfort. As you gain motion, your hands will come more underneath your shoulders.  Hold this position __________ seconds.  Slowly return to lying flat on the floor. Repeat __________ times. Complete this exercise __________ times per day.  RANGE OF MOTION- Quadruped, Neutral Spine    Assume a hands and knees position on a firm surface. Keep your hands under your shoulders and your knees under your hips. You may place padding under your knees for comfort.  Drop your head and point your tail bone toward the ground below you. This will round out your low back like an angry cat. Hold this position for __________ seconds.  Slowly lift your head and release your tail bone so that your back sags into a large arch, like an old horse.  Hold this position for __________ seconds.  Repeat this until you feel limber in your low back.  Now, find your "sweet spot." This will be the most comfortable position somewhere between the two previous positions. This is your neutral spine. Once you have found this position, tense your stomach muscles to support your low back.  Hold this position for __________ seconds. Repeat __________ times. Complete this exercise __________ times per day.  STRETCH - Flexion, Single Knee to Chest   Lie on a firm bed or floor with both legs extended in front of you.  Keeping one leg in contact with the floor, bring your opposite knee to your chest. Hold your leg in place by either grabbing behind your thigh or at your knee.  Pull until you feel a gentle stretch in your low back. Hold __________ seconds.  Slowly release your grasp and repeat the exercise with the opposite side. Repeat __________ times. Complete this exercise __________ times per day.  STRETCH - Hamstrings, Standing  Stand or sit and  extend your right / left leg, placing your foot on a chair or foot stool  Keeping a slight arch in your low back and your hips straight forward.  Lead with your chest and lean forward at the waist until you feel a gentle stretch in the back of your right / left knee or thigh. (When done correctly, this exercise requires leaning only a small distance.)  Hold this position for __________ seconds. Repeat __________ times. Complete this stretch __________  times per day. STRENGTHENING - Deep Abdominals, Pelvic Tilt   Lie on a firm bed or floor. Keeping your legs in front of you, bend your knees so they are both pointed toward the ceiling and your feet are flat on the floor.  Tense your lower abdominal muscles to press your low back into the floor. This motion will rotate your pelvis so that your tail bone is scooping upwards rather than pointing at your feet or into the floor.  With a gentle tension and even breathing, hold this position for __________ seconds. Repeat __________ times. Complete this exercise __________ times per day.  STRENGTHENING - Abdominals, Crunches   Lie on a firm bed or floor. Keeping your legs in front of you, bend your knees so they are both pointed toward the ceiling and your feet are flat on the floor. Cross your arms over your chest.  Slightly tip your chin down without bending your neck.  Tense your abdominals and slowly lift your trunk high enough to just clear your shoulder blades. Lifting higher can put excessive stress on the low back and does not further strengthen your abdominal muscles.  Control your return to the starting position. Repeat __________ times. Complete this exercise __________ times per day.  STRENGTHENING - Quadruped, Opposite UE/LE Lift   Assume a hands and knees position on a firm surface. Keep your hands under your shoulders and your knees under your hips. You may place padding under your knees for comfort.  Find your neutral spine and gently tense your abdominal muscles so that you can maintain this position. Your shoulders and hips should form a rectangle that is parallel with the floor and is not twisted.  Keeping your trunk steady, lift your right hand no higher than your shoulder and then your left leg no higher than your hip. Make sure you are not holding your breath. Hold this position __________ seconds.  Continuing to keep your abdominal muscles tense and your back steady,  slowly return to your starting position. Repeat with the opposite arm and leg. Repeat __________ times. Complete this exercise __________ times per day. Document Released: 11/30/2005 Document Revised: 02/04/2012 Document Reviewed: 02/24/2009 [facility] Patient Information 2015 [place], [place]. This information is not intended to replace advice given to you by your health care provider. Make sure you discuss any questions you have with your health care provider.

## 2015-08-15 NOTE — Progress Notes (Signed)
Pre visit review using our clinic review tool, if applicable. No additional management support is needed unless otherwise documented below in the visit note. 

## 2015-08-16 NOTE — Progress Notes (Signed)
   Subjective:    Patient ID: Alyssa Lindsey, female    DOB: November 25, 1929, 81 y.o.   MRN: [id number]  HPI The patient is an 81 YO female. She is coming in for lower back and buttock pain. Does not radiate. Present for about 1 week. No fevers or weight change. Denies any known injury or overuse. Has happened in the past and she took a steroid injection which resolved the symptoms. Taking OTC pain medication which is not effective.   Review of Systems  Constitutional: Negative for fever, chills, activity change, appetite change and fatigue.  Respiratory: Negative for cough, chest tightness, shortness of breath and wheezing.   Cardiovascular: Negative for chest pain, palpitations and leg swelling.  Gastrointestinal: Negative for nausea, vomiting, abdominal pain, diarrhea, blood in stool and abdominal distention.  Musculoskeletal: Positive for myalgias, back pain and arthralgias. Negative for gait problem.  Skin: Negative.   Neurological: Negative.       Objective:   Physical Exam  Constitutional: She is oriented to person, place, and time. She appears well-developed and well-nourished.  HENT:  Head: Normocephalic and atraumatic.  Eyes: EOM are normal. Pupils are equal, round, and reactive to light.  Neck: Normal range of motion. Neck supple. No JVD present. No tracheal deviation present. No thyromegaly present.  Cardiovascular: Normal rate and regular rhythm.   Pulmonary/Chest: Effort normal and breath sounds normal. No stridor. No respiratory distress. She has no wheezes. She has no rales.  Abdominal: Soft. Bowel sounds are normal. She exhibits no distension. There is no tenderness. There is no rebound.  Musculoskeletal: She exhibits tenderness.  Tenderness in the paraspinal low back and buttock. Does not radiate into the legs.   Lymphadenopathy:    She has no cervical adenopathy.  Neurological: She is alert and oriented to person, place, and time.  Skin: Skin is warm and dry.   Filed  Vitals:   08/15/15 1455  BP: 136/78  Pulse: 57  Temp: 98.5 F (36.9 C)  TempSrc: Oral  Resp: 18  Height:  (1.651 m)  Weight: 162 lb 1.9 oz (73.537 kg)  SpO2: 93%      Assessment & Plan:  Depo-medrol 40 mg IM given at visit.

## 2015-08-16 NOTE — Assessment & Plan Note (Signed)
Using her vicodin for pain and will try depo-medrol injection today as she got good benefit from this in the past. Talked to her about back exercises and stretching for prevention of injury.

## 2015-08-17 ENCOUNTER — Telehealth: Payer: Self-pay | Admitting: Cardiology

## 2015-08-17 NOTE — Telephone Encounter (Signed)
Patient calling the office for samples of medication: ° ° °1.  What medication and dosage are you requesting samples for? Xarelto  ° °2.  Are you currently out of this medication? Yes  ° °3. Are you requesting samples to get you through until a mail order prescription arrives? No  ° °

## 2015-08-17 NOTE — Telephone Encounter (Signed)
Returned call to patient advised office out of xarelto 20 mg samples.Advised I will send message to Candice at [facility] office to see if they have any samples.

## 2015-08-18 ENCOUNTER — Telehealth: Payer: Self-pay | Admitting: Internal Medicine

## 2015-08-18 ENCOUNTER — Ambulatory Visit: Payer: Self-pay | Admitting: Internal Medicine

## 2015-08-18 NOTE — Telephone Encounter (Signed)
TELEPHONE ADVICE RECORD [facility]  Patient Name: Alyssa Lindsey  DOB: Mar 05, 1929    Initial Comment Caller states c/o hip pain, wants to get cortisone shots in each hip   Nurse Assessment  Nurse: Odis Luster, RN, Bjorn Loser Date/Time (Eastern Time): 08/18/2015 3:05:50 PM  Confirm and document reason for call. If symptomatic, describe symptoms. ---Caller states c/o hip pain, wants to get cortisone shots in each hip. Rates pain 5-6/10. Reports that she has had injections in her hips before with good results about 2 years ago. Reports that she continues to walk but the pain is constant. Reports that she saw MD a few days ago and she was given an injection in her (R) hip with no relief.  Has the patient traveled out of the country within the last 30 days? ---No  Does the patient require triage? ---Yes  Related visit to physician within the last 2 weeks? ---Yes   Dr. Weyman Croon  Does the PT have any chronic conditions? (i.e. diabetes, asthma, etc.) ---Yes  List chronic conditions. ---arthritis, pre-diabetic, hypertension, a-fib     Guidelines    Guideline Title Affirmed Question Affirmed Notes  Hip Pain [1] SEVERE pain (e.g., excruciating, unable to do any normal activities) AND [2] not improved after 2 hours of pain medicine Reports that she is taking tylenol.   Final Disposition User   See Physician within 4 Hours (or PCP triage) Odis Luster, RN, Bjorn Loser    Comments  Appt made with Dr. Oliver Barre for 08/19/15 @ 4:15pm. Caller unable to make an appt today due to no transportation.   Referrals  REFERRED TO PCP OFFICE   Disagree/Comply: Disagree  Disagree/Comply Reason: Unable to find transportation

## 2015-08-18 NOTE — Telephone Encounter (Signed)
Returned call to patient Alyssa Lindsey office out of xarelto 20 mg too.Advised to call back next week to check for samples.

## 2015-08-20 ENCOUNTER — Encounter: Payer: Self-pay | Admitting: Family Medicine

## 2015-08-20 ENCOUNTER — Ambulatory Visit (INDEPENDENT_AMBULATORY_CARE_PROVIDER_SITE_OTHER): Payer: Medicare HMO | Admitting: Family Medicine

## 2015-08-20 VITALS — BP 130/70 | HR 61 | Temp 98.1°F | Wt 163.0 lb

## 2015-08-20 DIAGNOSIS — M533 Sacrococcygeal disorders, not elsewhere classified: Secondary | ICD-10-CM

## 2015-08-20 MED ORDER — PREDNISONE 20 MG PO TABS: ORAL_TABLET | ORAL | Status: DC

## 2015-08-20 NOTE — Patient Instructions (Signed)
Take 1000 mg tylenol every 6 hours as needed for pain. 

## 2015-08-20 NOTE — Progress Notes (Signed)
OFFICE NOTE  08/20/2015  CC:  Chief Complaint  Patient presents with  . hip pain    HPI: Patient is a 81 y.o. AA female who is here for ongoing pain in both gluteal regions over the last one week, with some extension of the pain down back of legs to level of back of knees bilat.  No tingling or numbness down legs.  Got steroid injection on R glut last week and she says this didn't help any.  No low back pain or stiffness.  Pertinent PMH:  PMH and PSH reviewed MEDS:  Outpatient Prescriptions Prior to Visit  Medication Sig Dispense Refill  . amLODipine (NORVASC) 5 MG tablet Take 1 tablet (5 mg total) by mouth daily. 30 tablet 5  . cetirizine (ZYRTEC) 10 MG tablet Take 1 tablet (10 mg total) by mouth daily. 30 tablet 11  . diclofenac sodium (VOLTAREN) 1 % GEL Apply 2 g topically 3 (three) times daily as needed. 100 g 2  . famotidine (PEPCID) 20 MG tablet Take 1 tablet (20 mg total) by mouth 2 (two) times daily. 60 tablet 3  . lisinopril-hydrochlorothiazide (PRINZIDE,ZESTORETIC) 20-25 MG per tablet TAKE ONE TABLET BY MOUTH ONCE DAILY 90 tablet 3  . metFORMIN (GLUCOPHAGE) 500 MG tablet Take 1 tablet (500 mg total) by mouth daily with breakfast. 90 tablet 2  . metoprolol tartrate (LOPRESSOR) 25 MG tablet TAKE ONE-HALF TABLET BY MOUTH TWICE DAILY 60 tablet 4  . rivaroxaban (XARELTO) 20 MG TABS tablet Take 1 tablet (20 mg total) by mouth daily with supper. 30 tablet 5   No facility-administered medications prior to visit.    PE: Blood pressure 130/70, pulse 61, temperature 98.1 F (36.7 C), weight 163 lb (73.936 kg). Gen: Alert, well appearing.  Patient is oriented to person, place, time, and situation. Pt examined with [facility] as chaperone. L spine ROM intact w/out pain. No L spine TTP. Mild TTP near SI joint area bilat--about 1/2 way down gluteal region focally. No diffuse SI joint tenderness, no tenderness around ischial tuberosity. NO lateral hip TTP. With pt supine, FABER  maneuver does elicit some SI joint pain bilat. LE strength 5/5 prox/dist bilat.  LE DTRs 1+ and symmetric in knees and ankles.   Sitting SLR neg bilat.   IMPRESSION AND PLAN:  Gluteal region pain, most likely some SI joint inflammation/pain.  Doubt sciatica. Will treat with a course of systemic steroids to see if this helps: prednisone  qd x 3d, then  qd x 3d, then  qd x 4d. Continue tylenol q6h prn pain.  An After Visit Summary was printed and given to the patient.  FOLLOW UP: prn

## 2015-08-22 ENCOUNTER — Other Ambulatory Visit: Payer: Self-pay | Admitting: Cardiology

## 2015-08-22 MED ORDER — RIVAROXABAN 20 MG PO TABS: 20.0000 mg | ORAL_TABLET | Freq: Every day | ORAL | Status: DC

## 2015-08-22 NOTE — Telephone Encounter (Signed)
Patient calling the office for samples of medication:   1.  What medication and dosage are you requesting samples for? Xarelto  2.  Are you currently out of this medication?no medicine left 3. Are you requesting samples to get you through until a mail order prescription arrives?no

## 2015-08-22 NOTE — Telephone Encounter (Signed)
Samples of Xarelto  # 20 given.  Patient notified to pick up at the front desk.

## 2015-08-22 NOTE — Telephone Encounter (Signed)
Samples of Xarelto 20mg # 20 given.  Patient notified to pick up at the front desk. 

## 2015-08-31 ENCOUNTER — Ambulatory Visit: Payer: Medicare HMO | Admitting: Internal Medicine

## 2015-09-03 ENCOUNTER — Encounter: Payer: Self-pay | Admitting: Family Medicine

## 2015-09-03 ENCOUNTER — Ambulatory Visit (INDEPENDENT_AMBULATORY_CARE_PROVIDER_SITE_OTHER): Payer: Medicare HMO | Admitting: Family Medicine

## 2015-09-03 VITALS — BP 160/100 | HR 57 | Temp 98.0°F | Ht 65.0 in | Wt 164.0 lb

## 2015-09-03 DIAGNOSIS — G8929 Other chronic pain: Secondary | ICD-10-CM | POA: Diagnosis not present

## 2015-09-03 DIAGNOSIS — M545 Low back pain: Secondary | ICD-10-CM | POA: Diagnosis not present

## 2015-09-03 MED ORDER — CYCLOBENZAPRINE HCL 10 MG PO TABS: 5.0000 mg | ORAL_TABLET | Freq: Three times a day (TID) | ORAL | Status: DC | PRN

## 2015-09-03 NOTE — Progress Notes (Signed)
Subjective:    Patient ID: Alyssa Lindsey, female    DOB: February 13, 1929, 81 y.o.   MRN: [id number]  HPI Here for low back and buttock pain   Seen last Sat by Dr Marvel Plan  Thought she had sacroiliac pain  Given prednisone  Told to do stretches   Is out of voltaren gel   Takes tylenol for pain   Pain is across her low back  Goes down both legs to her feet  Bothers her most to get up and walk  Lying down feels better   No numbness or weakness in legs or feet   Patient Active Problem List   Diagnosis Date Noted  . Tongue disorder 08/08/2015  . GERD (gastroesophageal reflux disease) 03/02/2015  . Medication refill 03/02/2015  . Muscle pain 01/21/2015  . Paroxysmal a-fib (HCC) 03/04/2014  . Chronic LLQ pain 02/07/2012  . VITAMIN B12 DEFICIENCY 03/06/2010  . Memory loss 07/28/2009  . 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 NECK PAIN, CHRONIC 03/22/2008  . THYROID NODULE 03/03/2008  . BREAST MASS 03/03/2008  . Diabetes type 2, controlled (HCC) 06/21/2007  . Essential hypertension 06/21/2007  . LOW BACK PAIN 06/21/2007   Past Medical History  Diagnosis Date  . Type II or unspecified type diabetes mellitus without mention of complication, not stated as uncontrolled   . HTN (hypertension)   . LBP (low back pain)   . Chronic neck pain   . Spondylosis   . Arthritis   . Diverticulosis of colon (without mention of hemorrhage)   . Hiatal hernia   . Gastritis   . Family history of malignant neoplasm of gastrointestinal tract   . Memory problem    Past Surgical History  Procedure Laterality Date  . Tubal ligation    . Colonoscopy  2010    NORMAL-- DUE 2020   Social History  Substance Use Topics  . Smoking status: Never Smoker   . Smokeless tobacco: Never Used  . Alcohol Use: No   Family History  Problem Relation Age of Onset  . Stroke Father 39  . Ovarian cancer Sister   . Breast cancer Sister   . Breast cancer Sister   . Liver disease Brother   .  Colon cancer Daughter   . Diabetes Daughter   . Throat cancer Brother   . Emphysema Brother   . Lung cancer Daughter    Allergies  Allergen Reactions  . Dicyclomine Hcl Other (See Comments)    Severe eye pain  . Aspirin Other (See Comments)    Unknown reaction   Current Outpatient Prescriptions on File Prior to Visit  Medication Sig Dispense Refill  . amLODipine (NORVASC) 5 MG tablet Take 1 tablet (5 mg total) by mouth daily. 30 tablet 5  . cetirizine (ZYRTEC) 10 MG tablet Take 1 tablet (10 mg total) by mouth daily. 30 tablet 11  . diclofenac sodium (VOLTAREN) 1 % GEL Apply 2 g topically 3 (three) times daily as needed. 100 g 2  . famotidine (PEPCID) 20 MG tablet Take 1 tablet (20 mg total) by mouth 2 (two) times daily. 60 tablet 3  . lisinopril-hydrochlorothiazide (PRINZIDE,ZESTORETIC) 20-25 MG per tablet TAKE ONE TABLET BY MOUTH ONCE DAILY 90 tablet 3  . metFORMIN (GLUCOPHAGE) 500 MG tablet Take 1 tablet (500 mg total) by mouth daily with breakfast. 90 tablet 2  . metoprolol tartrate (LOPRESSOR) 25 MG tablet TAKE ONE-HALF TABLET BY MOUTH TWICE DAILY 60 tablet 4  . rivaroxaban (XARELTO) 20  MG TABS tablet Take 1 tablet (20 mg total) by mouth daily with supper. 20 tablet 0   No current facility-administered medications on file prior to visit.     Review of Systems Review of Systems  Constitutional: Negative for fever, appetite change, fatigue and unexpected weight change.  Eyes: Negative for pain and visual disturbance.  Respiratory: Negative for cough and shortness of breath.   Cardiovascular: Negative for cp or palpitations    Gastrointestinal: Negative for nausea, diarrhea and constipation.  Genitourinary: Negative for urgency and frequency.  Skin: Negative for pallor or rash   MSK pos for low back pain with rad to both legs and L hip  Neurological: Negative for weakness, light-headedness, numbness and headaches.  Hematological: Negative for adenopathy. Does not bruise/bleed  easily.  Psychiatric/Behavioral: Negative for dysphoric mood. The patient is not nervous/anxious.          Objective:   Physical Exam  Constitutional: She appears well-developed and well-nourished. No distress.  overwt and well app   HENT:  Head: Normocephalic and atraumatic.  Eyes: Conjunctivae and EOM are normal. Pupils are equal, round, and reactive to light. No scleral icterus.  Neck: Normal range of motion. Neck supple.  Cardiovascular: Normal rate.   Pulmonary/Chest: Effort normal and breath sounds normal. She has no rales.  Abdominal:  No suprapubic tenderness or fullness    Musculoskeletal:  Tender over L4-L5  LS flex 90 deg Ext 20 deg  Tender over L SI joint (and piriformis stretch reprod that)  No rash   Neg SLR Some pain on int rot of L hip    Neurological: She is alert. She has normal strength and normal reflexes. She displays no atrophy. No sensory deficit. She exhibits normal muscle tone.  Skin: Skin is warm and dry. No rash noted. No erythema. No pallor.  Psychiatric: She has a normal mood and affect.          Assessment & Plan:   Problem List Items Addressed This Visit      Other   LOW BACK PAIN - Primary    Ongoing  SI problems lately - some more discomfort on L than right  Also hx of chronic low back pain   Today consistent with spasm and piriformis syndrome on the L  Taught piriformis stretch Trial of flexeril with caution of sedation and falls  inst heat 10 min intermittent with walking Given chronicity of problem -likely a candidate for PT and imaging -enc her to f/u with PCP  Update if not starting to improve in a week or if worsening        Relevant Medications   cyclobenzaprine (FLEXERIL) 10 MG tablet

## 2015-09-03 NOTE — Assessment & Plan Note (Addendum)
Ongoing  SI problems lately - some more discomfort on L than right  Also hx of chronic low back pain   Today consistent with spasm and piriformis syndrome on the L  Taught piriformis stretch Trial of flexeril with caution of sedation and falls  inst heat 10 min intermittent with walking Given chronicity of problem -likely a candidate for PT and imaging -enc her to f/u with PCP  Update if not starting to improve in a week or if worsening

## 2015-09-03 NOTE — Patient Instructions (Signed)
Try the muscle relaxer -flexeril , as needed for back pain and tightness Do the stretch I taught you  Use heat for 10 minutes at a time and walk 10 minutes at a time  Watch out for sleepiness or dizziness on the medication   Update if not starting to improve in a week or if worsening    Make a follow up appointment with your regular doctor when you can to investigate further since this is a chronic problem

## 2015-09-03 NOTE — Progress Notes (Signed)
Pre visit review using our clinic review tool, if applicable. No additional management support is needed unless otherwise documented below in the visit note. 

## 2015-09-05 ENCOUNTER — Ambulatory Visit
Admission: RE | Admit: 2015-09-05 | Discharge: 2015-09-05 | Disposition: A | Discharge status: Home / Self Care | Payer: Medicare HMO | Source: Ambulatory Visit | Attending: Internal Medicine | Admitting: Internal Medicine

## 2015-09-05 DIAGNOSIS — Z1231 Encounter for screening mammogram for malignant neoplasm of breast: Secondary | ICD-10-CM | POA: Diagnosis not present

## 2015-09-06 ENCOUNTER — Ambulatory Visit (INDEPENDENT_AMBULATORY_CARE_PROVIDER_SITE_OTHER): Payer: Medicare HMO | Admitting: Internal Medicine

## 2015-09-06 ENCOUNTER — Encounter: Payer: Self-pay | Admitting: Internal Medicine

## 2015-09-06 VITALS — BP 200/100 | HR 62 | Temp 98.0°F | Resp 12 | Ht 65.0 in | Wt 164.4 lb

## 2015-09-06 DIAGNOSIS — M545 Low back pain, unspecified: Secondary | ICD-10-CM

## 2015-09-06 DIAGNOSIS — Z23 Encounter for immunization: Secondary | ICD-10-CM

## 2015-09-06 DIAGNOSIS — M5442 Lumbago with sciatica, left side: Secondary | ICD-10-CM

## 2015-09-06 DIAGNOSIS — G8929 Other chronic pain: Secondary | ICD-10-CM

## 2015-09-06 MED ORDER — TRAMADOL HCL 50 MG PO TABS: 50.0000 mg | ORAL_TABLET | Freq: Three times a day (TID) | ORAL | Status: DC | PRN

## 2015-09-06 NOTE — Patient Instructions (Signed)
We have given you the flu shot today.   We have given you a prescription for tramadol which is a pain medicine that you can use. You can take 1 pill up to 3 times per day for pain. We will also get you in physical therapy to work on the back to help with the pain.

## 2015-09-06 NOTE — Progress Notes (Signed)
Pre visit review using our clinic review tool, if applicable. No additional management support is needed unless otherwise documented below in the visit note. 

## 2015-09-08 ENCOUNTER — Ambulatory Visit: Payer: Medicare HMO | Attending: Internal Medicine | Admitting: Physical Therapy

## 2015-09-08 DIAGNOSIS — M5441 Lumbago with sciatica, right side: Secondary | ICD-10-CM | POA: Insufficient documentation

## 2015-09-08 DIAGNOSIS — R262 Difficulty in walking, not elsewhere classified: Secondary | ICD-10-CM | POA: Insufficient documentation

## 2015-09-08 DIAGNOSIS — Z7409 Other reduced mobility: Secondary | ICD-10-CM | POA: Insufficient documentation

## 2015-09-08 DIAGNOSIS — R293 Abnormal posture: Secondary | ICD-10-CM | POA: Diagnosis not present

## 2015-09-08 DIAGNOSIS — M533 Sacrococcygeal disorders, not elsewhere classified: Secondary | ICD-10-CM | POA: Insufficient documentation

## 2015-09-08 NOTE — Therapy (Signed)
[facility] [address] [place], [place], [address] Phone: [phone]   Fax:  [phone]  Physical Therapy Evaluation  Patient Details  Name: Alyssa Lindsey MRN: [id number] Date of Birth: 1929/04/02 Referring Provider:  Myrlene Broker, *  Encounter Date: 09/08/2015      PT End of Session - 09/08/15 1433    Visit Number 1   Number of Visits 16   Date for PT Re-Evaluation 11/03/15   PT Start Time 1332   PT Stop Time 1425   PT Time Calculation (min) 53 min   Equipment Utilized During Treatment Gait belt;Other (comment)  clinic RW   Activity Tolerance Patient limited by pain   Behavior During Therapy [facility] for tasks assessed/performed      Past Medical History  Diagnosis Date  . Type II or unspecified type diabetes mellitus without mention of complication, not stated as uncontrolled   . HTN (hypertension)   . LBP (low back pain)   . Chronic neck pain   . Spondylosis   . Arthritis   . Diverticulosis of colon (without mention of hemorrhage)   . Hiatal hernia   . Gastritis   . Family history of malignant neoplasm of gastrointestinal tract   . Memory problem     Past Surgical History  Procedure Laterality Date  . Tubal ligation    . Colonoscopy  2010    NORMAL-- DUE 2020    There were no vitals filed for this visit.  Visit Diagnosis:  Low back pain with right-sided sciatica, unspecified back pain laterality  Sacral pain  Difficulty walking due to pelvic region and thigh joints  Abnormal posture  Decreased mobility and endurance      Subjective Assessment - 09/08/15 1340    Subjective Patient reports onset of low back pain ( 2 weeks) which radiates in Rt. Leg, groin, knee and occ to foot.  Has sensory changes, denies weakness. She has difficulty walking, cannot be standing for any length of time.    Patient is accompained by: Family member   Limitations Lifting;Standing;Walking;House hold  activities;Other (comment)  ADLs   Currently in Pain? Yes   Pain Score 8    Pain Location Hip   Pain Orientation Right;Posterior;Anterior   Pain Descriptors / Indicators Aching;Tightness;Tingling;Radiating   Pain Type Chronic pain   Pain Onset 1 to 4 weeks ago   Pain Frequency Constant   Aggravating Factors  walking, standing, weightbearing, turning over in the bed   Pain Relieving Factors lying down, sitting, heat, pain meds    Multiple Pain Sites No            [facility] PT Assessment - 09/08/15 1346    Assessment   Medical Diagnosis low back pain    Onset Date/Surgical Date --  2 weeks   Prior Therapy long ago for LBP   Precautions   Precautions None   Restrictions   Weight Bearing Restrictions No   Balance Screen   Has the patient fallen in the past 6 months No   Has the patient had a decrease in activity level because of a fear of falling?  Yes   Is the patient reluctant to leave their home because of a fear of falling?  No   Home Environment   Living Environment Private residence   Living Arrangements Alone   Type of Home House   Home Access Level entry   Prior Function   Level of Independence Independent   Cognition  Overall Cognitive Status Within Functional Limits for tasks assessed   Sensation   Light Touch Appears Intact   Coordination   Gross Motor Movements are Fluid and Coordinated Not tested   Posture/Postural Control   Posture/Postural Control Postural limitations   Postural Limitations Rounded Shoulders;Forward head;Posterior pelvic tilt;Flexed trunk   AROM   Lumbar Flexion touches below shin   Lumbar Extension 75% pain    Lumbar - Right Rotation 75%  pain Rt.    Lumbar - Left Rotation 75%  pain Rt.    Strength   Strength Assessment Site --  moves slowly with directions   Right Hip Flexion 3+/5   Right Hip ABduction 2-/5   Left Hip Flexion 3+/5   Left Hip ABduction --  NT due to pain with rolling   Right/Left Knee --  4+/5   Right/Left  Ankle --  4+/5   Palpation   Spinal mobility pain center L5- S1-S2   SI assessment  pain lateral border   Palpation comment non tender in Lumbar parapsinals    Special Tests   Sacroiliac Tests  Pelvic Distraction   FABER test   findings Positive   Side Right   Comment groin pain    Straight Leg Raise   Findings Negative   Side  --  bilateral    Comment pain end range   Pelvic Dictraction   Findings Unable to test   Side  --  no change   Pelvic Compression   Findings Positive   Side Right   comment --  bilateral pain relieved a bit   Sacral thrust    Findings Positive   Comments pain    Hip Scouring   Findings Positive   Side Right   Comments pain post in sacrum    Ambulation/Gait   Ambulation/Gait Yes   Ambulation/Gait Assistance 4: Min guard   Ambulation Distance (Feet) 100 Feet   Assistive device Rolling walker   Gait Pattern Decreased stance time - right;Right flexed knee in stance;Left flexed knee in stance;Shuffle   Gait Comments patient did not have asst device      MHP 12 min to Rt. groin and Rt. lumbar in semi-supine position, pillow between knees for comfort. No leg pain post, able to walk out with RW with supervision only, needed cues ot get into the car with good technique.         PT Education - 09/08/15 1432    Education provided Yes   Education Details PT/POC, posture, ADLs, positioning handout and bed, car transfers   Person(s) Educated Patient;Child(ren)   Methods Explanation;Demonstration;Handout;Verbal cues   Comprehension Verbalized understanding;Returned demonstration          PT Short Term Goals - 09/08/15 1440    PT SHORT TERM GOAL #1   Title Pt will need only supervision and verbal cueing for with bed mobility, transfers   Time 4   Period Weeks   Status New   PT SHORT TERM GOAL #2   Title Pt will be I with HEP   Time 4   Period Weeks   Status New   PT SHORT TERM GOAL #3   Title Pt will be mod I with device in and out of the  home for decr fall risk and pain relief   Time 4   Period Weeks   Status New   PT SHORT TERM GOAL #4   Title Pt will report less pain in LEs overall with standing (25% less)  Time 4   Period Weeks   Status New           PT Long Term Goals - 09-09-15 1442    PT LONG TERM GOAL #1   Title Pt will be I with more advanced HEP for posture, strength and flexibility   Time 8   Period Weeks   Status New   PT LONG TERM GOAL #2   Title Pt will be able to transfer with min pain only (supine to sit, rolling)   Time 8   Period Weeks   Status New   PT LONG TERM GOAL #3   Title Pt will be able to stand, walk in the home with min pain increase to allow light housework, ADLs   Time 8   Period Weeks   Status New   PT LONG TERM GOAL #4   Title FOTO will improve to 45% limited or less to demo improved mobility   Time 8   Period Weeks   Status New   PT LONG TERM GOAL #5   Title Pt will report being able to return to going out with family, church for full life participation with min pain increase overall   Time 8   Period Weeks   Status New               Plan - 09-Sep-2015 1434    Clinical Impression Statement Patient very limited today in mobility and ability tolerate positional changes. She stated that today was the worst she has been since condition flared up about 2 weeks ago. She has lumbosacral pain and radiation into Rt>Lt. LEs.  Pain is referred to Rt. groin as well and with weightbearing which points more towards a hip pathology.  She needed a RW today, will see if she can be issed one next visit.   Will continue to monitor and report symptoms, treat for pain, mobility and strengthening as appropriate   Pt will benefit from skilled therapeutic intervention in order to improve on the following deficits Abnormal gait;Decreased activity tolerance;Decreased mobility;Impaired flexibility;Decreased strength;Improper body mechanics;Postural dysfunction;Difficulty walking;Decreased  knowledge of use of DME;Impaired sensation;Decreased range of motion;Decreased balance;Pain;Decreased coordination;Increased fascial restricitons   Rehab Potential Good   PT Frequency 2x / week   PT Duration 8 weeks   PT Treatment/Interventions ADLs/Self Care Home Management;DME Instruction;Gait training;Functional mobility training;Manual techniques;Cryotherapy;Electrical Stimulation;Therapeutic exercise;Therapeutic activities;Moist Heat;Neuromuscular re-education;Balance training;Ultrasound;Passive range of motion   PT Next Visit Plan work on developing an HEP, modalities for pain and gentle stretching, see if eval signed to allow her to receive a RW   PT Home Exercise Plan ADLs, posture only    Consulted and Agree with Plan of Care Patient;Family member/caregiver   Family Member Consulted daughter          G-Codes - 09-09-2015 1447    Functional Assessment Tool Used FOTO   Functional Limitation Mobility: Walking and moving around   Mobility: Walking and Moving Around Current Status [phone]) At least 60 percent but less than 80 percent impaired, limited or restricted   Mobility: Walking and Moving Around Goal Status [phone]) At least 40 percent but less than 60 percent impaired, limited or restricted       Problem List Patient Active Problem List   Diagnosis Date Noted  . Tongue disorder 08/08/2015  . GERD (gastroesophageal reflux disease) 03/02/2015  . Medication refill 03/02/2015  . Muscle pain 01/21/2015  . Paroxysmal a-fib (HCC) 03/04/2014  . Chronic LLQ pain 02/07/2012  . VITAMIN B12  DEFICIENCY 03/06/2010  . Memory loss 07/28/2009  . 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 NECK PAIN, CHRONIC 03/22/2008  . THYROID NODULE 03/03/2008  . BREAST MASS 03/03/2008  . Diabetes type 2, controlled (HCC) 06/21/2007  . Essential hypertension 06/21/2007  . LOW BACK PAIN 06/21/2007    PAA,JENNIFER 09/08/2015, 2:53 PM  [facility]  [address] [place], [place], [address] Phone: [phone]   Fax:  [phone]    Karie MainlandJennifer Paa, PT 09/08/2015 2:54 PM Phone: [phone] Fax: [phone]

## 2015-09-08 NOTE — Patient Instructions (Signed)

## 2015-09-09 NOTE — Assessment & Plan Note (Signed)
Referral to physical therapy for exercises to help with her pain and possible TENS unit. Rx for tramadol for short term usage. No indication for imaging today.

## 2015-09-09 NOTE — Progress Notes (Signed)
   Subjective:    Patient ID: Alyssa Lindsey, female    DOB: Apr 15, 1929, 81 y.o.   MRN: [id number]  HPI The patient is an 81 YO female coming in for left hip pain. It is on the lateral thigh and in the buttock. She has been taking ibuprofen and tylenol over the counter a lot lately without good relief. Denies injury to the area. Denies numbness or weakness in her legs. No fevers or chills or weight change.   Review of Systems  Constitutional: Negative for fever, chills, activity change, appetite change and fatigue.  Respiratory: Negative for cough, chest tightness, shortness of breath and wheezing.   Cardiovascular: Negative for chest pain, palpitations and leg swelling.  Gastrointestinal: Negative for nausea, vomiting, abdominal pain, diarrhea, blood in stool and abdominal distention.  Musculoskeletal: Positive for myalgias, back pain and arthralgias. Negative for gait problem.  Skin: Negative.   Neurological: Negative.       Objective:   Physical Exam  Constitutional: She is oriented to person, place, and time. She appears well-developed and well-nourished.  HENT:  Head: Normocephalic and atraumatic.  Eyes: EOM are normal. Pupils are equal, round, and reactive to light.  Neck: Normal range of motion. Neck supple. No JVD present. No tracheal deviation present. No thyromegaly present.  Cardiovascular: Normal rate and regular rhythm.   Pulmonary/Chest: Effort normal and breath sounds normal. No stridor. No respiratory distress. She has no wheezes. She has no rales.  Abdominal: Soft. Bowel sounds are normal. She exhibits no distension. There is no tenderness. There is no rebound.  Musculoskeletal: She exhibits tenderness.  Tenderness in the left trochanteric bursa and buttock. Does not radiate into the legs.   Lymphadenopathy:    She has no cervical adenopathy.  Neurological: She is alert and oriented to person, place, and time.  Skin: Skin is warm and dry.   Filed Vitals:   09/06/15 1400  BP: 200/100  Pulse: 62  Temp: 98 F (36.7 C)  TempSrc: Oral  Resp: 12  Height: 5\' 5"  (1.651 m)  Weight: 164 lb 6.4 oz (74.571 kg)  SpO2: 94%      Assessment & Plan:  Flu shot given at visit.

## 2015-09-15 ENCOUNTER — Ambulatory Visit: Payer: Medicare HMO | Admitting: Physical Therapy

## 2015-09-15 DIAGNOSIS — Z7409 Other reduced mobility: Secondary | ICD-10-CM | POA: Diagnosis not present

## 2015-09-15 DIAGNOSIS — M533 Sacrococcygeal disorders, not elsewhere classified: Secondary | ICD-10-CM

## 2015-09-15 DIAGNOSIS — M5441 Lumbago with sciatica, right side: Secondary | ICD-10-CM

## 2015-09-15 DIAGNOSIS — R293 Abnormal posture: Secondary | ICD-10-CM | POA: Diagnosis not present

## 2015-09-15 DIAGNOSIS — R262 Difficulty in walking, not elsewhere classified: Secondary | ICD-10-CM | POA: Diagnosis not present

## 2015-09-15 NOTE — Therapy (Addendum)
[facility] [place], [place], [address] Phone: [phone]   Fax:  [phone]  Physical Therapy Treatment  Patient Details  Name: Alyssa Lindsey MRN: [id number] Date of Birth: 10-May-1929 No Data Recorded  Encounter Date: 09/15/2015      PT End of Session - 09/15/15 1159    Visit Number 2   Number of Visits 16   Date for PT Re-Evaluation 11/03/15   PT Start Time 1147   Activity Tolerance Patient limited by pain   Behavior During Therapy [facility] for tasks assessed/performed      Past Medical History  Diagnosis Date  . Type II or unspecified type diabetes mellitus without mention of complication, not stated as uncontrolled   . HTN (hypertension)   . LBP (low back pain)   . Chronic neck pain   . Spondylosis   . Arthritis   . Diverticulosis of colon (without mention of hemorrhage)   . Hiatal hernia   . Gastritis   . Family history of malignant neoplasm of gastrointestinal tract   . Memory problem     Past Surgical History  Procedure Laterality Date  . Tubal ligation    . Colonoscopy  2010    NORMAL-- DUE 2020    There were no vitals filed for this visit.  Visit Diagnosis:  Low back pain with right-sided sciatica, unspecified back pain laterality  Sacral pain      Subjective Assessment - 09/15/15 1149    Subjective Pt with bilateral groin pain, back pain  and difficulty walking.     Currently in Pain? Yes   Pain Score 7    Pain Location Groin   Pain Orientation Right;Left   Pain Descriptors / Indicators Sore   Pain Type Acute pain   Pain Onset More than a month ago   Pain Frequency Constant   Aggravating Factors  turning over in bed, walking    Multiple Pain Sites Yes   Pain Score 5   Pain Location Back   Pain Orientation Lower   Pain Descriptors / Indicators Sore   Pain Type Chronic pain   Pain Onset More than a month ago   Pain Frequency Intermittent   Aggravating Factors  walking   Pain Relieving Factors lying down flat          [facility] Adult PT Treatment/Exercise - 09/15/15 1159    Self-Care   Self-Care ADL's;Posture   ADL's positioning for bed mobility, use of pillow between knees   Posture knees together with supine to sit to supine and car   Lumbar Exercises: Stretches   Single Knee to Chest Stretch 2 reps;30 seconds   Lower Trunk Rotation 5 reps   Lower Trunk Rotation Limitations with ball    Pelvic Tilt Limitations x10   Lumbar Exercises: Supine   Ab Set 10 reps   Glut Set 10 reps   Clam 10 reps   Clam Limitations 2nd set with red band    Bent Knee Raise 10 reps   Other Supine Lumbar Exercises hip abd x 10 each leg   incr time    Moist Heat Therapy   Number Minutes Moist Heat 15 Minutes   Moist Heat Location Lumbar Spine;Hip  bilateral groin             PT Education - 09/15/15 1206    Education provided Yes   Education Details positioning, HEP , use of walker to ease gait   Person(s) Educated Patient  Methods Explanation   Comprehension Verbalized understanding          PT Short Term Goals - 09/15/15 1201    PT SHORT TERM GOAL #1   Title Pt will need only supervision and verbal cueing for with bed mobility, transfers   Status On-going   PT SHORT TERM GOAL #2   Title Pt will be I with HEP   Status On-going   PT SHORT TERM GOAL #3   Title Pt will be mod I with device in and out of the home for decr fall risk and pain relief   Status On-going   PT SHORT TERM GOAL #4   Title Pt will report less pain in LEs overall with standing (25% less)   Status On-going           PT Long Term Goals - 09/15/15 1201    PT LONG TERM GOAL #1   Title Pt will be I with more advanced HEP for posture, strength and flexibility   Status On-going   PT LONG TERM GOAL #2   Title Pt will be able to transfer with min pain only (supine to sit, rolling)   Status On-going   PT LONG TERM GOAL #3   Title Pt will be able to stand, walk in the home with  min pain increase to allow light housework, ADLs   Status On-going   PT LONG TERM GOAL #4   Title FOTO will improve to 45% limited or less to demo improved mobility   Status On-going   PT LONG TERM GOAL #5   Title Pt will report being able to return to going out with family, church for full life participation with min pain increase overall   Status On-going               Plan - 09/15/15 1231    Clinical Impression Statement Alyssa Lindsey cont to have difficulty with mobility, states pain in groin with hip abd during functional activity.  She was advised to change movement patterns and use walker to ease pain. (She has a walker at home, actually). Pt with groin pain when sitting up, resolves when lying flat.  She was vague about sensation changes in "saddle", seems to be more anterior, will cont to monitor.    PT Home Exercise Plan clam supine and sidelying   Recommended Other Services pelvis XR in the future if pain does not resolve   Consulted and Agree with Plan of Care Patient        Problem List Patient Active Problem List   Diagnosis Date Noted  . Tongue disorder 08/08/2015  . GERD (gastroesophageal reflux disease) 03/02/2015  . Medication refill 03/02/2015  . Muscle pain 01/21/2015  . Paroxysmal a-fib ([place]) 03/04/2014  . Chronic LLQ pain 02/07/2012  . VITAMIN B12 DEFICIENCY 03/06/2010  . Memory loss 07/28/2009  . 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 NECK PAIN, CHRONIC 03/22/2008  . THYROID NODULE 03/03/2008  . BREAST MASS 03/03/2008  . Diabetes type 2, controlled ([place]) 06/21/2007  . Essential hypertension 06/21/2007  . LOW BACK PAIN 06/21/2007    PAA,JENNIFER 09/15/2015, 12:37 PM  [facility] [address] [place], [place], [address] Phone: [phone]   Fax:  [phone]  Name: Alyssa Lindsey MRN: [id number] Date of Birth: [birth year]    Raeford Razor, PT 09/15/2015 12:37 PM Phone:  [phone] Fax: [phone]    PHYSICAL THERAPY DISCHARGE SUMMARY  Visits from Start of Care:  2  Current functional level related to goals / functional outcomes: See above   Remaining deficits: See above   Education / Equipment: See above  Plan: Patient agrees to discharge.  Patient goals were not met. Patient is being discharged due to not returning since the last visit.  ?????    Raeford Razor, PT 02/13/2016 3:04 PM Phone: [phone] Fax: [phone]

## 2015-09-15 NOTE — Patient Instructions (Signed)
Clam Shell 45 Degrees   Lying with hips and knees bent 45, one pillow between knees and ankles. Lift knee. Be sure pelvis does not roll backward. Do not arch back. Do _10__ times, each leg, __2_ times per day.  http://ss.exer.us/75   Copyright  VHI. All rights reserved.   

## 2015-09-28 ENCOUNTER — Telehealth: Payer: Self-pay

## 2015-09-28 NOTE — Telephone Encounter (Signed)
Can we change the visit with PCP to a 30 min CPE/AWV (appt for 11/2015).

## 2015-09-30 NOTE — Telephone Encounter (Signed)
Got appointment changed

## 2015-10-10 ENCOUNTER — Telehealth: Payer: Self-pay | Admitting: Cardiology

## 2015-10-10 NOTE — Telephone Encounter (Signed)
Patient calling the office for samples of medication: ° ° °1.  What medication and dosage are you requesting samples for? Xarelto  ° °2.  Are you currently out of this medication? yes ° ° °

## 2015-10-11 NOTE — Telephone Encounter (Signed)
Patient aware samples are at the front desk for pick up  

## 2015-11-03 ENCOUNTER — Encounter: Payer: Self-pay | Admitting: Cardiology

## 2015-11-03 ENCOUNTER — Ambulatory Visit (INDEPENDENT_AMBULATORY_CARE_PROVIDER_SITE_OTHER): Payer: Medicare HMO | Admitting: Cardiology

## 2015-11-03 VITALS — BP 172/98 | HR 67 | Ht 65.0 in | Wt 162.4 lb

## 2015-11-03 DIAGNOSIS — I480 Paroxysmal atrial fibrillation: Secondary | ICD-10-CM | POA: Diagnosis not present

## 2015-11-03 DIAGNOSIS — I10 Essential (primary) hypertension: Secondary | ICD-10-CM | POA: Diagnosis not present

## 2015-11-03 DIAGNOSIS — Z79899 Other long term (current) drug therapy: Secondary | ICD-10-CM

## 2015-11-03 LAB — CBC
HEMATOCRIT: 39.3 % (ref 36.0–46.0)
HEMOGLOBIN: 12.8 g/dL (ref 12.0–15.0)
MCH: 26.9 pg (ref 26.0–34.0)
MCHC: 32.6 g/dL (ref 30.0–36.0)
MCV: 82.6 fL (ref 78.0–100.0)
Platelets: 188 10*3/uL (ref 150–400)
RBC: 4.76 MIL/uL (ref 3.87–5.11)
RDW: 16.3 % — AB (ref 11.5–15.5)
WBC: 5.6 10*3/uL (ref 4.0–10.5)

## 2015-11-03 MED ORDER — AMLODIPINE BESYLATE 10 MG PO TABS: 10.0000 mg | ORAL_TABLET | Freq: Every day | ORAL | Status: DC

## 2015-11-03 NOTE — Patient Instructions (Signed)
Your physician wants you to follow-up in: 6 Months. You will receive a reminder letter in the mail two months in advance. If you don't receive a letter, please call our office to schedule the follow-up appointment.  Your physician recommends that you return for lab work in: CBC  Your physician has recommended you make the following change in your medication: Increase Amlodipine 10 mg daily

## 2015-11-03 NOTE — Progress Notes (Signed)
HPI The patient presents for evaluation of atrial fibrillation.    She does not feel palpitations.. He has no presyncope or syncope. He denies any chest pressure, neck or arm discomfort. She denies any shortness of breath, PND or orthopnea. She's had no weight gain or edema. Otherwise she feels okay. She's not having any other cardiovascular symptoms.  Unfortunately, her son died last month of pneumonia complications.  She is very sad about this and tearful in the office today.   Allergies  Allergen Reactions  . Dicyclomine Hcl Other (See Comments)    Severe eye pain  . Aspirin Other (See Comments)    Unknown reaction    Current Outpatient Prescriptions  Medication Sig Dispense Refill  . amLODipine (NORVASC) 5 MG tablet Take 1 tablet (5 mg total) by mouth daily. 30 tablet 5  . cetirizine (ZYRTEC) 10 MG tablet Take 1 tablet (10 mg total) by mouth daily. 30 tablet 11  . diclofenac sodium (VOLTAREN) 1 % GEL Apply 2 g topically 3 (three) times daily as needed. 100 g 2  . famotidine (PEPCID) 20 MG tablet Take 1 tablet (20 mg total) by mouth 2 (two) times daily. 60 tablet 3  . lisinopril-hydrochlorothiazide (PRINZIDE,ZESTORETIC) 20-25 MG per tablet TAKE ONE TABLET BY MOUTH ONCE DAILY 90 tablet 3  . metFORMIN (GLUCOPHAGE) 500 MG tablet Take 1 tablet (500 mg total) by mouth daily with breakfast. 90 tablet 2  . metoprolol tartrate (LOPRESSOR) 25 MG tablet TAKE ONE-HALF TABLET BY MOUTH TWICE DAILY 60 tablet 4  . rivaroxaban (XARELTO) 20 MG TABS tablet Take 1 tablet (20 mg total) by mouth daily with supper. 20 tablet 0  . traMADol (ULTRAM) 50 MG tablet Take 1 tablet (50 mg total) by mouth every 8 (eight) hours as needed. 60 tablet 0   No current facility-administered medications for this visit.    Past Medical History  Diagnosis Date  . Type II or unspecified type diabetes mellitus without mention of complication, not stated as uncontrolled   . HTN (hypertension)   . LBP (low back pain)   .  Chronic neck pain   . Spondylosis   . Arthritis   . Diverticulosis of colon (without mention of hemorrhage)   . Hiatal hernia   . Gastritis   . Family history of malignant neoplasm of gastrointestinal tract   . Memory problem     Past Surgical History  Procedure Laterality Date  . Tubal ligation    . Colonoscopy  2010    NORMAL-- DUE 2020    ROS:  Positive for reflux.  Otherwise as stated in the HPI and negative for all other systems.    PHYSICAL EXAM BP 172/98 mmHg  Pulse 67  Ht  (1.651 m)  Wt 162 lb 6.4 oz (73.664 kg)  BMI 27.02 kg/m2 GENERAL:  Well appearing NECK:  No jugular venous distention, waveform within normal limits, carotid upstroke brisk and symmetric, no bruits, no thyromegaly LUNGS:  Clear to auscultation bilaterally HEART:  PMI not displaced or sustained,S1 and S2 within normal limits, no S3, no S4 no clicks, no rubs, no murmurs ABD:  Flat, positive bowel sounds normal in frequency in pitch, no bruits, no rebound, no guarding, no midline pulsatile mass, no hepatomegaly, no splenomegaly EXT:  2 plus pulses throughout, no edema, no cyanosis no clubbing SKIN:  No rashes no nodules PSYCH:  Tearful  EKG:  NSR, rate 67 , axis WNL intervals WNL, no acute ST T wave changes. PVCs.  11/03/2015   ASSESSMENT AND PLAN  ATRIAL FIB:  Alyssa Lindsey has a CHA2DS2 - VASc score of 5 with a risk of stroke of 6.7%  and a HAS - BLED score of 2 with a low risk of bleeding. She had a Holter with PAF.  She does not really feel this.  No change in therapy is indicated.   I will check a CBC.   HTN:  Her BP is not controlled and I will increase the Norvasc to 10 mg.

## 2015-11-07 ENCOUNTER — Telehealth: Payer: Self-pay | Admitting: Cardiology

## 2015-11-07 MED ORDER — RIVAROXABAN 20 MG PO TABS: 20.0000 mg | ORAL_TABLET | Freq: Every day | ORAL | Status: DC

## 2015-11-07 NOTE — Telephone Encounter (Signed)
Samples x 2 available Patient aware

## 2015-11-07 NOTE — Telephone Encounter (Signed)
Patient calling the office for samples of medication: ° ° °1.  What medication and dosage are you requesting samples for? Xarelto  ° °2.  Are you currently out of this medication? yes ° ° °

## 2015-11-08 ENCOUNTER — Telehealth: Payer: Self-pay | Admitting: Internal Medicine

## 2015-11-08 MED ORDER — HYDROCODONE-HOMATROPINE 5-1.5 MG/5ML PO SYRP: 5.0000 mL | ORAL_SOLUTION | Freq: Three times a day (TID) | ORAL | Status: DC | PRN

## 2015-11-08 NOTE — Telephone Encounter (Signed)
Pts daughter states pt has a bad cough and wanting a prescription for cough syrup  Pharmacy is [facility] on [address]

## 2015-11-08 NOTE — Telephone Encounter (Signed)
Faxed to pharmacy and patient is aware.

## 2015-11-08 NOTE — Telephone Encounter (Signed)
Rx printed and signed.  

## 2015-11-15 ENCOUNTER — Ambulatory Visit (INDEPENDENT_AMBULATORY_CARE_PROVIDER_SITE_OTHER): Payer: Medicare HMO | Admitting: Internal Medicine

## 2015-11-15 ENCOUNTER — Ambulatory Visit (INDEPENDENT_AMBULATORY_CARE_PROVIDER_SITE_OTHER)
Admission: RE | Admit: 2015-11-15 | Discharge: 2015-11-15 | Disposition: A | Discharge status: Home / Self Care | Payer: Medicare HMO | Source: Ambulatory Visit | Attending: Internal Medicine | Admitting: Internal Medicine

## 2015-11-15 ENCOUNTER — Encounter: Payer: Self-pay | Admitting: Internal Medicine

## 2015-11-15 VITALS — BP 150/82 | HR 61 | Temp 98.5°F | Resp 16 | Ht 65.0 in | Wt 160.0 lb

## 2015-11-15 DIAGNOSIS — J209 Acute bronchitis, unspecified: Secondary | ICD-10-CM | POA: Insufficient documentation

## 2015-11-15 DIAGNOSIS — R059 Cough, unspecified: Secondary | ICD-10-CM

## 2015-11-15 DIAGNOSIS — I10 Essential (primary) hypertension: Secondary | ICD-10-CM | POA: Diagnosis not present

## 2015-11-15 DIAGNOSIS — R05 Cough: Secondary | ICD-10-CM

## 2015-11-15 MED ORDER — HYDROCODONE-HOMATROPINE 5-1.5 MG/5ML PO SYRP: 5.0000 mL | ORAL_SOLUTION | Freq: Three times a day (TID) | ORAL | Status: DC | PRN

## 2015-11-15 MED ORDER — AZITHROMYCIN 500 MG PO TABS: 500.0000 mg | ORAL_TABLET | Freq: Every day | ORAL | Status: DC

## 2015-11-15 MED ORDER — AZILSARTAN-CHLORTHALIDONE 40-12.5 MG PO TABS: 1.0000 | ORAL_TABLET | Freq: Every day | ORAL | Status: DC

## 2015-11-15 NOTE — Patient Instructions (Signed)

## 2015-11-15 NOTE — Progress Notes (Signed)
Subjective:  Patient ID: Alyssa Lindsey, female    DOB: 1929-01-23  Age: 81 y.o. MRN: [id number]  CC: Cough   HPI Alyssa Lindsey presents for a cough productive of yellow phlegm with chills for about 5 days. She does have a history of pneumonia.  Outpatient Prescriptions Prior to Visit  Medication Sig Dispense Refill  . amLODipine (NORVASC) 10 MG tablet Take 1 tablet (10 mg total) by mouth daily. 30 tablet 9  . cetirizine (ZYRTEC) 10 MG tablet Take 1 tablet (10 mg total) by mouth daily. 30 tablet 11  . diclofenac sodium (VOLTAREN) 1 % GEL Apply 2 g topically 3 (three) times daily as needed. 100 g 2  . famotidine (PEPCID) 20 MG tablet Take 1 tablet (20 mg total) by mouth 2 (two) times daily. 60 tablet 3  . metFORMIN (GLUCOPHAGE) 500 MG tablet Take 1 tablet (500 mg total) by mouth daily with breakfast. 90 tablet 2  . metoprolol tartrate (LOPRESSOR) 25 MG tablet TAKE ONE-HALF TABLET BY MOUTH TWICE DAILY 60 tablet 4  . rivaroxaban (XARELTO) 20 MG TABS tablet Take 1 tablet (20 mg total) by mouth daily with supper. 10 tablet 0  . traMADol (ULTRAM) 50 MG tablet Take 1 tablet (50 mg total) by mouth every 8 (eight) hours as needed. 60 tablet 0  . HYDROcodone-homatropine (HYCODAN) 5-1.5 MG/5ML syrup Take 5 mLs by mouth every 8 (eight) hours as needed for cough. 120 mL 0  . lisinopril-hydrochlorothiazide (PRINZIDE,ZESTORETIC) 20-25 MG per tablet TAKE ONE TABLET BY MOUTH ONCE DAILY 90 tablet 3   No facility-administered medications prior to visit.    ROS Review of Systems  Constitutional: Positive for chills. Negative for fever, diaphoresis, activity change, appetite change, fatigue and unexpected weight change.  HENT: Positive for congestion and sore throat. Negative for postnasal drip, rhinorrhea, sinus pressure, sneezing and trouble swallowing.   Eyes: Negative.   Respiratory: Positive for cough. Negative for apnea, choking, chest tightness, shortness of breath, wheezing and stridor.      She complains of a chronic, intermittent cough and feels like sometimes her lips get swollen and they tingle.  Cardiovascular: Negative.  Negative for chest pain, palpitations and leg swelling.  Gastrointestinal: Negative.  Negative for nausea, vomiting, abdominal pain, diarrhea and constipation.  Endocrine: Negative.   Genitourinary: Negative.   Musculoskeletal: Negative.  Negative for myalgias and back pain.  Skin: Negative.  Negative for color change and rash.  Allergic/Immunologic: Negative.   Neurological: Negative.  Negative for dizziness and light-headedness.  Hematological: Negative.  Negative for adenopathy. Does not bruise/bleed easily.  Psychiatric/Behavioral: Negative.     Objective:  BP 150/82 mmHg  Pulse 61  Temp(Src) 98.5 F (36.9 C) (Oral)  Resp 16  Ht 5\' 5"  (1.651 m)  Wt 160 lb (72.576 kg)  BMI 26.63 kg/m2  SpO2 95%  BP Readings from Last 3 Encounters:  11/15/15 150/82  11/03/15 172/98  09/06/15 200/100    Wt Readings from Last 3 Encounters:  11/15/15 160 lb (72.576 kg)  11/03/15 162 lb 6.4 oz (73.664 kg)  09/06/15 164 lb 6.4 oz (74.571 kg)    Physical Exam  Constitutional: She is oriented to person, place, and time. She appears well-developed and well-nourished.  Non-toxic appearance. She does not have a sickly appearance. She does not appear ill. No distress.  HENT:  Mouth/Throat: Oropharynx is clear and moist. No oropharyngeal exudate.  Eyes: Conjunctivae are normal. Right eye exhibits no discharge. Left eye exhibits no discharge. No scleral icterus.  Neck: Normal range of motion. Neck supple. No JVD present. No tracheal deviation present. No thyromegaly present.  Cardiovascular: Normal rate, regular rhythm, normal heart sounds and intact distal pulses.  Exam reveals no gallop and no friction rub.   No murmur heard. Pulmonary/Chest: Effort normal and breath sounds normal. No stridor. No respiratory distress. She has no wheezes. She has no rales. She  exhibits no tenderness.  Abdominal: Soft. Bowel sounds are normal. She exhibits no distension and no mass. There is no tenderness. There is no rebound and no guarding.  Musculoskeletal: Normal range of motion. She exhibits no edema or tenderness.  Lymphadenopathy:    She has no cervical adenopathy.  Neurological: She is oriented to person, place, and time.  Skin: Skin is warm and dry. No rash noted. She is not diaphoretic. No erythema. No pallor.  Vitals reviewed.   Lab Results  Component Value Date   WBC 5.6 11/03/2015   HGB 12.8 11/03/2015   HCT 39.3 11/03/2015   PLT 188 11/03/2015   GLUCOSE 103* 06/23/2015   CHOL 164 01/10/2012   TRIG 131.0 01/10/2012   HDL 50.00 01/10/2012   LDLCALC 88 01/10/2012   ALT 6 06/23/2015   AST 12 06/23/2015   NA 142 06/23/2015   K 4.5 06/23/2015   CL 107 06/23/2015   CREATININE 1.05 06/23/2015   BUN 12 06/23/2015   CO2 29 06/23/2015   TSH 2.97 07/30/2014   INR 0.98 03/04/2014   HGBA1C 6.1 06/23/2015   MICROALBUR 0.6 07/30/2014    Mammogram Digital Screening  09/06/2015  CLINICAL DATA:  Screening. EXAM: DIGITAL SCREENING BILATERAL MAMMOGRAM WITH CAD COMPARISON:  Previous exam(s). ACR Breast Density Category b: There are scattered areas of fibroglandular density. FINDINGS: There are no findings suspicious for malignancy. Images were processed with CAD. IMPRESSION: No mammographic evidence of malignancy. A result letter of this screening mammogram will be mailed directly to the patient. RECOMMENDATION: Screening mammogram in one year. (Code:SM-B-01Y) BI-RADS CATEGORY  1: Negative. Electronically Signed   By: Frederico Hamman M.D.   On: 09/06/2015 17:01    Assessment & Plan:   Alyssa Lindsey was seen today for cough.  Diagnoses and all orders for this visit:  Cough- her chest x-ray is normal -     HYDROcodone-homatropine (HYCODAN) 5-1.5 MG/5ML syrup; Take 5 mLs by mouth every 8 (eight) hours as needed for cough. -     DG Chest 2 View;  Future  Acute bronchitis, unspecified organism- I will treat the infection with Zithromax and will control the cough with Hycodan -     azithromycin (ZITHROMAX) 500 MG tablet; Take 1 tablet (500 mg total) by mouth daily.  Essential hypertension- I think she has allergies related to the ACE inhibitor so I've asked her to stop it. Will control her blood pressure with an ARB/thiazide combination. -     Azilsartan-Chlorthalidone (EDARBYCLOR) 40-12.5 MG TABS; Take 1 tablet by mouth daily.  I have discontinued Ms. Mcguire's lisinopril-hydrochlorothiazide. I am also having her start on Azilsartan-Chlorthalidone and azithromycin. Additionally, I am having her maintain her diclofenac sodium, metFORMIN, famotidine, metoprolol tartrate, cetirizine, traMADol, amLODipine, rivaroxaban, and HYDROcodone-homatropine.  Meds ordered this encounter  Medications  . HYDROcodone-homatropine (HYCODAN) 5-1.5 MG/5ML syrup    Sig: Take 5 mLs by mouth every 8 (eight) hours as needed for cough.    Dispense:  120 mL    Refill:  0  . Azilsartan-Chlorthalidone (EDARBYCLOR) 40-12.5 MG TABS    Sig: Take 1 tablet by mouth daily.  Dispense:  30 tablet    Refill:  11  . azithromycin (ZITHROMAX) 500 MG tablet    Sig: Take 1 tablet (500 mg total) by mouth daily.    Dispense:  3 tablet    Refill:  0     Follow-up: Return in about 2 weeks (around 11/29/2015).  Sanda Linger, MD

## 2015-11-15 NOTE — Progress Notes (Signed)
Pre visit review using our clinic review tool, if applicable. No additional management support is needed unless otherwise documented below in the visit note. 

## 2015-11-25 ENCOUNTER — Other Ambulatory Visit: Payer: Self-pay | Admitting: Family Medicine

## 2015-11-25 NOTE — Telephone Encounter (Signed)
Not Dr. Royden Purlower's pt, routed to PCP

## 2015-12-26 ENCOUNTER — Encounter: Payer: Self-pay | Admitting: Internal Medicine

## 2015-12-26 ENCOUNTER — Ambulatory Visit (INDEPENDENT_AMBULATORY_CARE_PROVIDER_SITE_OTHER): Payer: Medicare HMO | Admitting: Internal Medicine

## 2015-12-26 ENCOUNTER — Ambulatory Visit: Payer: Medicare HMO | Admitting: Internal Medicine

## 2015-12-26 ENCOUNTER — Other Ambulatory Visit (INDEPENDENT_AMBULATORY_CARE_PROVIDER_SITE_OTHER): Payer: Medicare HMO

## 2015-12-26 VITALS — BP 122/62 | HR 51 | Temp 98.1°F | Ht 65.0 in | Wt 161.2 lb

## 2015-12-26 DIAGNOSIS — I10 Essential (primary) hypertension: Secondary | ICD-10-CM

## 2015-12-26 DIAGNOSIS — E119 Type 2 diabetes mellitus without complications: Secondary | ICD-10-CM

## 2015-12-26 DIAGNOSIS — Z0001 Encounter for general adult medical examination with abnormal findings: Secondary | ICD-10-CM | POA: Insufficient documentation

## 2015-12-26 DIAGNOSIS — Z0000 Encounter for general adult medical examination without abnormal findings: Secondary | ICD-10-CM | POA: Diagnosis not present

## 2015-12-26 DIAGNOSIS — K219 Gastro-esophageal reflux disease without esophagitis: Secondary | ICD-10-CM

## 2015-12-26 LAB — HEMOGLOBIN A1C: HEMOGLOBIN A1C: 6.6 % — AB (ref 4.6–6.5)

## 2015-12-26 LAB — COMPREHENSIVE METABOLIC PANEL
ALT: 8 U/L (ref 0–35)
AST: 13 U/L (ref 0–37)
Albumin: 4.1 g/dL (ref 3.5–5.2)
Alkaline Phosphatase: 76 U/L (ref 39–117)
BUN: 19 mg/dL (ref 6–23)
CO2: 30 meq/L (ref 19–32)
CREATININE: 1.33 mg/dL — AB (ref 0.40–1.20)
Calcium: 9.5 mg/dL (ref 8.4–10.5)
Chloride: 105 mEq/L (ref 96–112)
GFR: 48.8 mL/min — AB (ref >60.00)
GLUCOSE: 109 mg/dL — AB (ref 70–99)
POTASSIUM: 4.1 meq/L (ref 3.5–5.1)
Sodium: 142 mEq/L (ref 135–145)
Total Bilirubin: 0.5 mg/dL (ref 0.2–1.2)
Total Protein: 7.4 g/dL (ref 6.0–8.3)

## 2015-12-26 LAB — LIPID PANEL
CHOL/HDL RATIO: 4
CHOLESTEROL: 164 mg/dL (ref 0–200)
HDL: 44.4 mg/dL (ref >39.00)
LDL CALC: 93 mg/dL (ref 0–99)
NonHDL: 119.83
TRIGLYCERIDES: 132 mg/dL (ref 0.0–149.0)
VLDL: 26.4 mg/dL (ref 0.0–40.0)

## 2015-12-26 NOTE — Assessment & Plan Note (Signed)
Checking labs, aged out of colon cancer screening and mammogram. Has not had shingles and declines today. Immunizations otherwise up to date. Counseled her on the need for regular exercise to keep her balance and strength. Given 10 year screening recommendations.

## 2015-12-26 NOTE — Patient Instructions (Signed)
We will check the blood work today and call you back with the results.   You are doing good with the health so keep up the good work.  Come back in about 6 months or sooner if you have any problems or questions.   Health Maintenance, Female Adopting a healthy lifestyle and getting preventive care can go a long way to promote health and wellness. Talk with your health care provider about what schedule of regular examinations is right for you. This is a good chance for you to check in with your provider about disease prevention and staying healthy. In between checkups, there are plenty of things you can do on your own. Experts have done a lot of research about which lifestyle changes and preventive measures are most likely to keep you healthy. Ask your health care provider for more information. WEIGHT AND DIET  Eat a healthy diet  Be sure to include plenty of vegetables, fruits, low-fat dairy products, and lean protein.  Do not eat a lot of foods high in solid fats, added sugars, or salt.  Get regular exercise. This is one of the most important things you can do for your health.  Most adults should exercise for at least 150 minutes each week. The exercise should increase your heart rate and make you sweat (moderate-intensity exercise).  Most adults should also do strengthening exercises at least twice a week. This is in addition to the moderate-intensity exercise.  Maintain a healthy weight  Body mass index (BMI) is a measurement that can be used to identify possible weight problems. It estimates body fat based on height and weight. Your health care provider can help determine your BMI and help you achieve or maintain a healthy weight.  For females 10 years of age and older:   A BMI below 18.5 is considered underweight.  A BMI of 18.5 to 24.9 is normal.  A BMI of 25 to 29.9 is considered overweight.  A BMI of 30 and above is considered obese.  Watch levels of cholesterol and blood  lipids  You should start having your blood tested for lipids and cholesterol at 80 years of age, then have this test every 5 years.  You may need to have your cholesterol levels checked more often if:  Your lipid or cholesterol levels are high.  You are older than 80 years of age.  You are at high risk for heart disease.  CANCER SCREENING   Lung Cancer  Lung cancer screening is recommended for adults 41-80 years old who are at high risk for lung cancer because of a history of smoking.  A yearly low-dose CT scan of the lungs is recommended for people who:  Currently smoke.  Have quit within the past 15 years.  Have at least a 30-pack-year history of smoking. A pack year is smoking an average of one pack of cigarettes a day for 1 year.  Yearly screening should continue until it has been 15 years since you quit.  Yearly screening should stop if you develop a health problem that would prevent you from having lung cancer treatment.  Breast Cancer  Practice breast self-awareness. This means understanding how your breasts normally appear and feel.  It also means doing regular breast self-exams. Let your health care provider know about any changes, no matter how small.  If you are in your 20s or 30s, you should have a clinical breast exam (CBE) by a health care provider every 1-3 years as part of  a regular health exam.  If you are 32 or older, have a CBE every year. Also consider having a breast X-ray (mammogram) every year.  If you have a family history of breast cancer, talk to your health care provider about genetic screening.  If you are at high risk for breast cancer, talk to your health care provider about having an MRI and a mammogram every year.  Breast cancer gene (BRCA) assessment is recommended for women who have family members with BRCA-related cancers. BRCA-related cancers include:  Breast.  Ovarian.  Tubal.  Peritoneal cancers.  Results of the assessment  will determine the need for genetic counseling and BRCA1 and BRCA2 testing. Cervical Cancer Your health care provider may recommend that you be screened regularly for cancer of the pelvic organs (ovaries, uterus, and vagina). This screening involves a pelvic examination, including checking for microscopic changes to the surface of your cervix (Pap test). You may be encouraged to have this screening done every 3 years, beginning at age 15.  For women ages 34-65, health care providers may recommend pelvic exams and Pap testing every 3 years, or they may recommend the Pap and pelvic exam, combined with testing for human papilloma virus (HPV), every 5 years. Some types of HPV increase your risk of cervical cancer. Testing for HPV may also be done on women of any age with unclear Pap test results.  Other health care providers may not recommend any screening for nonpregnant women who are considered low risk for pelvic cancer and who do not have symptoms. Ask your health care provider if a screening pelvic exam is right for you.  If you have had past treatment for cervical cancer or a condition that could lead to cancer, you need Pap tests and screening for cancer for at least 20 years after your treatment. If Pap tests have been discontinued, your risk factors (such as having a new sexual partner) need to be reassessed to determine if screening should resume. Some women have medical problems that increase the chance of getting cervical cancer. In these cases, your health care provider may recommend more frequent screening and Pap tests. Colorectal Cancer  This type of cancer can be detected and often prevented.  Routine colorectal cancer screening usually begins at 80 years of age and continues through 80 years of age.  Your health care provider may recommend screening at an earlier age if you have risk factors for colon cancer.  Your health care provider may also recommend using home test kits to check  for hidden blood in the stool.  A small camera at the end of a tube can be used to examine your colon directly (sigmoidoscopy or colonoscopy). This is done to check for the earliest forms of colorectal cancer.  Routine screening usually begins at age 31.  Direct examination of the colon should be repeated every 5-10 years through 80 years of age. However, you may need to be screened more often if early forms of precancerous polyps or small growths are found. Skin Cancer  Check your skin from head to toe regularly.  Tell your health care provider about any new moles or changes in moles, especially if there is a change in a mole's shape or color.  Also tell your health care provider if you have a mole that is larger than the size of a pencil eraser.  Always use sunscreen. Apply sunscreen liberally and repeatedly throughout the day.  Protect yourself by wearing long sleeves, pants, a wide-brimmed  hat, and sunglasses whenever you are outside. HEART DISEASE, DIABETES, AND HIGH BLOOD PRESSURE   High blood pressure causes heart disease and increases the risk of stroke. High blood pressure is more likely to develop in:  People who have blood pressure in the high end of the normal range (130-139/85-89 mm Hg).  People who are overweight or obese.  People who are African American.  If you are 67-46 years of age, have your blood pressure checked every 3-5 years. If you are 31 years of age or older, have your blood pressure checked every year. You should have your blood pressure measured twice--once when you are at a hospital or clinic, and once when you are not at a hospital or clinic. Record the average of the two measurements. To check your blood pressure when you are not at a hospital or clinic, you can use:  An automated blood pressure machine at a pharmacy.  A home blood pressure monitor.  If you are between 59 years and 25 years old, ask your health care provider if you should take  aspirin to prevent strokes.  Have regular diabetes screenings. This involves taking a blood sample to check your fasting blood sugar level.  If you are at a normal weight and have a low risk for diabetes, have this test once every three years after 80 years of age.  If you are overweight and have a high risk for diabetes, consider being tested at a younger age or more often. PREVENTING INFECTION  Hepatitis B  If you have a higher risk for hepatitis B, you should be screened for this virus. You are considered at high risk for hepatitis B if:  You were born in a country where hepatitis B is common. Ask your health care provider which countries are considered high risk.  Your parents were born in a high-risk country, and you have not been immunized against hepatitis B (hepatitis B vaccine).  You have HIV or AIDS.  You use needles to inject street drugs.  You live with someone who has hepatitis B.  You have had sex with someone who has hepatitis B.  You get hemodialysis treatment.  You take certain medicines for conditions, including cancer, organ transplantation, and autoimmune conditions. Hepatitis C  Blood testing is recommended for:  Everyone born from [birth year] through 1963.  Anyone with known risk factors for hepatitis C. Sexually transmitted infections (STIs)  You should be screened for sexually transmitted infections (STIs) including gonorrhea and chlamydia if:  You are sexually active and are younger than 80 years of age.  You are older than 80 years of age and your health care provider tells you that you are at risk for this type of infection.  Your sexual activity has changed since you were last screened and you are at an increased risk for chlamydia or gonorrhea. Ask your health care provider if you are at risk.  If you do not have HIV, but are at risk, it may be recommended that you take a prescription medicine daily to prevent HIV infection. This is called  pre-exposure prophylaxis (PrEP). You are considered at risk if:  You are sexually active and do not regularly use condoms or know the HIV status of your partner(s).  You take drugs by injection.  You are sexually active with a partner who has HIV. Talk with your health care provider about whether you are at high risk of being infected with HIV. If you choose to begin PrEP, you  should first be tested for HIV. You should then be tested every 3 months for as long as you are taking PrEP.  PREGNANCY   If you are premenopausal and you may become pregnant, ask your health care provider about preconception counseling.  If you may become pregnant, take 400 to 800 micrograms (mcg) of folic acid every day.  If you want to prevent pregnancy, talk to your health care provider about birth control (contraception). OSTEOPOROSIS AND MENOPAUSE   Osteoporosis is a disease in which the bones lose minerals and strength with aging. This can result in serious bone fractures. Your risk for osteoporosis can be identified using a bone density scan.  If you are 54 years of age or older, or if you are at risk for osteoporosis and fractures, ask your health care provider if you should be screened.  Ask your health care provider whether you should take a calcium or vitamin D supplement to lower your risk for osteoporosis.  Menopause may have certain physical symptoms and risks.  Hormone replacement therapy may reduce some of these symptoms and risks. Talk to your health care provider about whether hormone replacement therapy is right for you.  HOME CARE INSTRUCTIONS   Schedule regular health, dental, and eye exams.  Stay current with your immunizations.   Do not use any tobacco products including cigarettes, chewing tobacco, or electronic cigarettes.  If you are pregnant, do not drink alcohol.  If you are breastfeeding, limit how much and how often you drink alcohol.  Limit alcohol intake to no more than 1  drink per day for nonpregnant women. One drink equals 12 ounces of beer, 5 ounces of wine, or 1 ounces of hard liquor.  Do not use street drugs.  Do not share needles.  Ask your health care provider for help if you need support or information about quitting drugs.  Tell your health care provider if you often feel depressed.  Tell your health care provider if you have ever been abused or do not feel safe at home.   This information is not intended to replace advice given to you by your health care provider. Make sure you discuss any questions you have with your health care provider.   Document Released: 05/28/2011 Document Revised: 12/03/2014 Document Reviewed: 10/14/2013 Elsevier Interactive Patient Education [employer].

## 2015-12-26 NOTE — Assessment & Plan Note (Signed)
Taken off metformin after last visit's labs (HgA1c 6.1). Checking today and adjust as needed. Not complicated. Reminded about eye exam, foot exam up to date. On ARB.

## 2015-12-26 NOTE — Progress Notes (Signed)
   Subjective:    Patient ID: Alyssa Lindsey, female    DOB: 04-01-1929, 82 y.o.   MRN: [id number]  HPI Here for medicare wellness, no new complaints. Please see A/P for status and treatment of chronic medical problems. Left hip is feeling better since last visit.   Diet: heart healthy, low carb Physical activity: sedentary Depression/mood screen: negative Hearing: intact to whispered voice Visual acuity: grossly normal, performs annual eye exam  ADLs: capable Fall risk: none Home safety: good Cognitive evaluation: intact to orientation, naming, recall and repetition EOL planning: adv directives discussed  I have personally reviewed and have noted 1. The patient's medical and social history - reviewed today no changes 2. Their use of alcohol, tobacco or illicit drugs 3. Their current medications and supplements 4. The patient's functional ability including ADL's, fall risks, home safety risks and hearing or visual impairment. 5. Diet and physical activities 6. Evidence for depression or mood disorders 7. Care team reviewed and updated (available in snapshot)  Review of Systems  Constitutional: Negative for fever, chills, activity change, appetite change and fatigue.  HENT: Negative.   Eyes: Negative.   Respiratory: Negative for cough, chest tightness, shortness of breath and wheezing.   Cardiovascular: Negative for chest pain, palpitations and leg swelling.  Gastrointestinal: Negative for nausea, vomiting, abdominal pain, diarrhea, blood in stool and abdominal distention.  Musculoskeletal: Positive for arthralgias. Negative for myalgias, back pain and gait problem.  Skin: Negative.   Neurological: Negative.   Psychiatric/Behavioral: Negative.       Objective:   Physical Exam  Constitutional: She is oriented to person, place, and time. She appears well-developed and well-nourished.  HENT:  Head: Normocephalic and atraumatic.  Eyes: EOM are normal. Pupils are equal, round,  and reactive to light.  Neck: Normal range of motion. Neck supple. No JVD present. No tracheal deviation present. No thyromegaly present.  Cardiovascular: Normal rate and regular rhythm.   Pulmonary/Chest: Effort normal and breath sounds normal. No stridor. No respiratory distress. She has no wheezes. She has no rales.  Abdominal: Soft. Bowel sounds are normal. She exhibits no distension. There is no tenderness. There is no rebound.  Musculoskeletal: She exhibits no edema.     Lymphadenopathy:    She has no cervical adenopathy.  Neurological: She is alert and oriented to person, place, and time.  Skin: Skin is warm and dry.   Filed Vitals:   12/26/15 0928  BP: 122/62  Pulse: 51  Temp: 98.1 F (36.7 C)  TempSrc: Oral  Height:  (1.651 m)  Weight: 161 lb 4 oz (73.143 kg)  SpO2: 98%      Assessment & Plan:

## 2015-12-26 NOTE — Assessment & Plan Note (Signed)
BP at goal on amlodipine and edarbychlor and metoprolol. Checking CMP and adjust as needed.

## 2015-12-26 NOTE — Assessment & Plan Note (Signed)
Taking pepcid BID which is controlling her symptoms.

## 2015-12-26 NOTE — Progress Notes (Signed)
Pre visit review using our clinic review tool, if applicable. No additional management support is needed unless otherwise documented below in the visit note. 

## 2016-01-10 ENCOUNTER — Telehealth: Payer: Self-pay | Admitting: Cardiology

## 2016-01-10 MED ORDER — RIVAROXABAN 20 MG PO TABS: 20.0000 mg | ORAL_TABLET | Freq: Every day | ORAL | Status: DC

## 2016-01-10 NOTE — Telephone Encounter (Signed)
Samples at front desk, pt informed. 

## 2016-01-10 NOTE — Telephone Encounter (Signed)
Patient calling the office for samples of medication: ° ° °1.  What medication and dosage are you requesting samples for? Xarelto  ° °2.  Are you currently out of this medication? yes ° ° °

## 2016-01-26 ENCOUNTER — Other Ambulatory Visit: Payer: Self-pay | Admitting: Internal Medicine

## 2016-03-21 ENCOUNTER — Ambulatory Visit (INDEPENDENT_AMBULATORY_CARE_PROVIDER_SITE_OTHER): Payer: Medicare HMO | Admitting: Family

## 2016-03-21 ENCOUNTER — Encounter: Payer: Self-pay | Admitting: Family

## 2016-03-21 VITALS — BP 140/90 | HR 50 | Temp 97.6°F | Ht 65.0 in | Wt 170.0 lb

## 2016-03-21 DIAGNOSIS — M533 Sacrococcygeal disorders, not elsewhere classified: Secondary | ICD-10-CM | POA: Diagnosis not present

## 2016-03-21 MED ORDER — DICLOFENAC SODIUM 2 % TD SOLN: 1.0000 "application " | Freq: Two times a day (BID) | TRANSDERMAL | Status: DC

## 2016-03-21 NOTE — Progress Notes (Signed)
Subjective:    Patient ID: Alyssa Lindsey, female    DOB: May 15, 1929, 82 y.o.   MRN: [id number]   Alyssa Lindsey is a 82 y.o. female who presents today for an acute visit.    HPI Comments: Patient presents for evaluation of pain in left buttocks and right foot swelling, both resolved at this time.No injury or falls. Describes buttucks with  'sore spot'  With onset after right foot swelling. No trouble walking. Tried aleve with minimal relief.   N/o h/o LE swelling or osteoporosis.     Past Medical History  Diagnosis Date  . Type II or unspecified type diabetes mellitus without mention of complication, not stated as uncontrolled   . HTN (hypertension)   . LBP (low back pain)   . Chronic neck pain   . Spondylosis   . Arthritis   . Diverticulosis of colon (without mention of hemorrhage)   . Hiatal hernia   . Gastritis   . Family history of malignant neoplasm of gastrointestinal tract   . Memory problem    Dicyclomine hcl; Lisinopril; and Aspirin Current Outpatient Prescriptions on File Prior to Visit  Medication Sig Dispense Refill  . amLODipine (NORVASC) 10 MG tablet Take 1 tablet (10 mg total) by mouth daily. 30 tablet 9  . Azilsartan-Chlorthalidone (EDARBYCLOR) 40-12.5 MG TABS Take 1 tablet by mouth daily. 30 tablet 11  . cetirizine (ZYRTEC) 10 MG tablet Take 1 tablet (10 mg total) by mouth daily. 30 tablet 11  . cyclobenzaprine (FLEXERIL) 10 MG tablet TAKE ONE-HALF TO ONE TABLET BY MOUTH THREE TIMES DAILY AS NEEDED FOR MUSCLE SPASMS**WATCH OUT FOR SEDATION AND CAREFUL OF FALLS** 30 tablet 0  . famotidine (PEPCID) 20 MG tablet TAKE ONE TABLET BY MOUTH TWICE DAILY 60 tablet 2  . metoprolol tartrate (LOPRESSOR) 25 MG tablet TAKE ONE-HALF TABLET BY MOUTH TWICE DAILY 60 tablet 4  . rivaroxaban (XARELTO) 20 MG TABS tablet Take 1 tablet (20 mg total) by mouth daily with supper. 35 tablet 0  . traMADol (ULTRAM) 50 MG tablet Take 1 tablet (50 mg total) by mouth every 8 (eight)  hours as needed. 60 tablet 0   No current facility-administered medications on file prior to visit.    Social History  Substance Use Topics  . Smoking status: Never Smoker   . Smokeless tobacco: Never Used  . Alcohol Use: No    Review of Systems  Constitutional: Negative for fever and chills.  Respiratory: Negative for cough and shortness of breath.   Cardiovascular: Negative for chest pain, palpitations and leg swelling.  Gastrointestinal: Negative for nausea and vomiting.      Objective:    BP 140/90 mmHg  Pulse 50  Temp(Src) 97.6 F (36.4 C) (Oral)  Ht  (1.651 m)  Wt 170 lb (77.111 kg)  BMI 28.29 kg/m2  SpO2 95%   Physical Exam  Constitutional: She appears well-developed and well-nourished.  Eyes: Conjunctivae are normal.  Cardiovascular: Normal rate, regular rhythm, normal heart sounds and normal pulses.   Palpable pedal pulses. No edema bilateral lower extremities. No pain or swelling in bilateral calves.  Pulmonary/Chest: Effort normal and breath sounds normal. She has no wheezes. She has no rhonchi. She has no rales.  Musculoskeletal:       Right hip: Normal. She exhibits normal range of motion, normal strength, no tenderness, no bony tenderness, no swelling and no deformity.       Back:  Pain over SI joint with deep palpation. Full  ROM of right hip.    Neurological: She is alert.  Skin: Skin is warm and dry.  Psychiatric: She has a normal mood and affect. Her speech is normal and behavior is normal. Thought content normal.  Vitals reviewed.      Assessment & Plan:   1. SI (sacroiliac) pain Suspect that right pedal swelling cause change in patient's gait which may contributed to SI joint pain. I'm reassured that both symptoms have resolved at this point. I gave patient a sample of Pennsaid as she told me Voltaren gel was too expensive for her to trial for SI pain. If pain recurs, I advised her to see sports medicine for possible SI joint  injection.   - Diclofenac Sodium 2 % SOLN; Place 1 application onto the skin 2 (two) times daily.  Dispense: 1 Bottle; Refill: 1   I am having [name] maintain her metoprolol tartrate, cetirizine, traMADol, amLODipine, Azilsartan-Chlorthalidone, cyclobenzaprine, rivaroxaban, and famotidine.   No orders of the defined types were placed in this encounter.     Start medications as prescribed and explained to patient on After Visit Summary ( AVS). Risks, benefits, and alternatives of the medications and treatment plan prescribed today were discussed, and patient expressed understanding.   Education regarding symptom management and diagnosis given to patient.   Follow-up:Plan follow-up as discussed or as needed if any worsening symptoms or change in condition. No Follow-up on file.   Continue to follow with Myrlene BrokerElizabeth A Crawford, MD for routine health maintenance.   Alyssa Perchesorothy B Duplantis and I agreed with plan.   Rennie PlowmanMargaret Simara Rhyner, FNP

## 2016-03-21 NOTE — Patient Instructions (Signed)
Trial of Pennsaid for SI joint pain.   Heat.   If there is no improvement in your symptoms, or if there is any worsening of symptoms, or if you have any additional concerns, please return for re-evaluation; or, if we are closed, consider going to the Emergency Room for evaluation if symptoms urgent.

## 2016-03-21 NOTE — Progress Notes (Signed)
Pre visit review using our clinic review tool, if applicable. No additional management support is needed unless otherwise documented below in the visit note. 

## 2016-03-22 ENCOUNTER — Other Ambulatory Visit: Payer: Self-pay | Admitting: Family

## 2016-03-22 ENCOUNTER — Telehealth: Payer: Self-pay

## 2016-03-22 NOTE — Telephone Encounter (Signed)
Per pt's insurance, Pennsaid is not covered.  They are requesting medication change to Voltaren gel, please addvise. Thanks!  Pharmacy: [facility] [address]

## 2016-03-26 ENCOUNTER — Other Ambulatory Visit: Payer: Self-pay | Admitting: Family

## 2016-03-26 DIAGNOSIS — M533 Sacrococcygeal disorders, not elsewhere classified: Secondary | ICD-10-CM

## 2016-03-26 MED ORDER — DICLOFENAC SODIUM 2 % TD SOLN: 1.0000 "application " | Freq: Two times a day (BID) | TRANSDERMAL | Status: DC

## 2016-03-26 NOTE — Telephone Encounter (Signed)
Please advise on below message  Thanks

## 2016-03-27 ENCOUNTER — Other Ambulatory Visit: Payer: Self-pay | Admitting: Family

## 2016-03-30 ENCOUNTER — Other Ambulatory Visit: Payer: Self-pay | Admitting: Internal Medicine

## 2016-04-06 ENCOUNTER — Encounter: Payer: Self-pay | Admitting: Internal Medicine

## 2016-04-06 ENCOUNTER — Ambulatory Visit (INDEPENDENT_AMBULATORY_CARE_PROVIDER_SITE_OTHER): Payer: Medicare HMO | Admitting: Internal Medicine

## 2016-04-06 VITALS — BP 160/70 | HR 57 | Temp 98.6°F | Wt 167.0 lb

## 2016-04-06 DIAGNOSIS — J069 Acute upper respiratory infection, unspecified: Secondary | ICD-10-CM | POA: Diagnosis not present

## 2016-04-06 DIAGNOSIS — R600 Localized edema: Secondary | ICD-10-CM

## 2016-04-06 DIAGNOSIS — I10 Essential (primary) hypertension: Secondary | ICD-10-CM

## 2016-04-06 DIAGNOSIS — R609 Edema, unspecified: Secondary | ICD-10-CM | POA: Insufficient documentation

## 2016-04-06 MED ORDER — CEFDINIR 300 MG PO CAPS: 300.0000 mg | ORAL_CAPSULE | Freq: Two times a day (BID) | ORAL | Status: DC

## 2016-04-06 MED ORDER — PROMETHAZINE-CODEINE 6.25-10 MG/5ML PO SYRP: 5.0000 mL | ORAL_SOLUTION | ORAL | Status: DC | PRN

## 2016-04-06 NOTE — Assessment & Plan Note (Signed)
Edarbichlor

## 2016-04-06 NOTE — Assessment & Plan Note (Signed)
Omnicef Prom-cod syr prn

## 2016-04-06 NOTE — Assessment & Plan Note (Signed)
Take Hydrographic surveyordarbichlor and Lopressor today NAS diet

## 2016-04-06 NOTE — Progress Notes (Signed)
Pre visit review using our clinic review tool, if applicable. No additional management support is needed unless otherwise documented below in the visit note. 

## 2016-04-06 NOTE — Progress Notes (Signed)
Subjective:  Patient ID: Alyssa Lindsey, female    DOB: 1929/04/26  Age: 82 y.o. MRN: [id number]  CC: No chief complaint on file.   HPI Alyssa PerchesDorothy B Lindsey presents for URI sx's x 1 wk. C/o cough w yellow sputum. F/u HTN. Pt has noticed some leg swelling  Outpatient Prescriptions Prior to Visit  Medication Sig Dispense Refill  . amLODipine (NORVASC) 10 MG tablet Take 1 tablet (10 mg total) by mouth daily. 30 tablet 9  . Azilsartan-Chlorthalidone (EDARBYCLOR) 40-12.5 MG TABS Take 1 tablet by mouth daily. 30 tablet 11  . cetirizine (ZYRTEC) 10 MG tablet Take 1 tablet (10 mg total) by mouth daily. 30 tablet 11  . cyclobenzaprine (FLEXERIL) 10 MG tablet TAKE ONE-HALF TO ONE TABLET BY MOUTH THREE TIMES DAILY AS NEEDED FOR MUSCLE SPASMS**WATCH OUT FOR SEDATION AND CAREFUL OF FALLS** 30 tablet 0  . Diclofenac Sodium 2 % SOLN Place 1 application onto the skin 2 (two) times daily. 1 Bottle 1  . famotidine (PEPCID) 20 MG tablet TAKE ONE TABLET BY MOUTH TWICE DAILY 60 tablet 2  . metoprolol tartrate (LOPRESSOR) 25 MG tablet TAKE ONE-HALF TABLET BY MOUTH TWICE DAILY 60 tablet 4  . rivaroxaban (XARELTO) 20 MG TABS tablet Take 1 tablet (20 mg total) by mouth daily with supper. 35 tablet 0  . traMADol (ULTRAM) 50 MG tablet Take 1 tablet (50 mg total) by mouth every 8 (eight) hours as needed. 60 tablet 0  . VOLTAREN 1 % GEL APPLY TWO GRAMS THREE TIMES DAILY AS NEEDED 100 g 3   No facility-administered medications prior to visit.    ROS Review of Systems  Constitutional: Positive for chills and fatigue. Negative for activity change, appetite change and unexpected weight change.  HENT: Positive for postnasal drip, rhinorrhea and sinus pressure. Negative for congestion and mouth sores.   Eyes: Negative for visual disturbance.  Respiratory: Positive for cough. Negative for chest tightness and shortness of breath.   Cardiovascular: Positive for leg swelling.  Gastrointestinal: Negative for nausea and  abdominal pain.  Genitourinary: Negative for frequency, difficulty urinating and vaginal pain.  Musculoskeletal: Negative for back pain and gait problem.  Skin: Negative for pallor and rash.  Neurological: Negative for dizziness, tremors, weakness, numbness and headaches.  Psychiatric/Behavioral: Negative for confusion and sleep disturbance.    Objective:  BP 160/70 mmHg  Pulse 57  Temp(Src) 98.6 F (37 C) (Oral)  Wt 167 lb (75.751 kg)  SpO2 97%  BP Readings from Last 3 Encounters:  04/06/16 160/70  03/21/16 140/90  12/26/15 122/62    Wt Readings from Last 3 Encounters:  04/06/16 167 lb (75.751 kg)  03/21/16 170 lb (77.111 kg)  12/26/15 161 lb 4 oz (73.143 kg)    Physical Exam  Constitutional: She appears well-developed.  HENT:  Head: Normocephalic.  Right Ear: External ear normal.  Left Ear: External ear normal.  Nose: Nose normal.  Mouth/Throat: Oropharynx is clear and moist.  Eyes: Conjunctivae are normal. Pupils are equal, round, and reactive to light. Right eye exhibits no discharge. Left eye exhibits no discharge.  Neck: Normal range of motion. Neck supple. No JVD present. No tracheal deviation present. No thyromegaly present.  Cardiovascular: Normal rate, regular rhythm and normal heart sounds.   Pulmonary/Chest: No stridor. No respiratory distress. She has no wheezes.  Abdominal: Soft. Bowel sounds are normal. She exhibits no distension and no mass. There is no tenderness. There is no rebound and no guarding.  Musculoskeletal: She exhibits edema. She  exhibits no tenderness.  Lymphadenopathy:    She has no cervical adenopathy.  Neurological: She displays normal reflexes. No cranial nerve deficit. She exhibits normal muscle tone. Coordination normal.  Skin: No rash noted. No erythema.  Psychiatric: She has a normal mood and affect. Her behavior is normal. Judgment and thought content normal.  B trace edema eryth throat  Lab Results  Component Value Date   WBC  5.6 11/03/2015   HGB 12.8 11/03/2015   HCT 39.3 11/03/2015   PLT 188 11/03/2015   GLUCOSE 109* 12/26/2015   CHOL 164 12/26/2015   TRIG 132.0 12/26/2015   HDL 44.40 12/26/2015   LDLCALC 93 12/26/2015   ALT 8 12/26/2015   AST 13 12/26/2015   NA 142 12/26/2015   K 4.1 12/26/2015   CL 105 12/26/2015   CREATININE 1.33* 12/26/2015   BUN 19 12/26/2015   CO2 30 12/26/2015   TSH 2.97 07/30/2014   INR 0.98 03/04/2014   HGBA1C 6.6* 12/26/2015   MICROALBUR 0.6 07/30/2014    Dg Chest 2 View  11/15/2015  CLINICAL DATA:  Cough, wheezing, and chest congestion for 3 days. EXAM: CHEST  2 VIEW COMPARISON:  09/27/2014 FINDINGS: The heart size and mediastinal contours are within normal limits. Mild tortuosity of thoracic aorta is stable. Both lungs are clear. No evidence of pleural effusion. Thoracic spine degenerative changes again noted. IMPRESSION: Stable exam.  No active cardiopulmonary disease. Electronically Signed   By: Myles Rosenthal M.D.   On: 11/15/2015 18:08    Assessment & Plan:   There are no diagnoses linked to this encounter. I am having Ms. Goldenstein maintain her metoprolol tartrate, cetirizine, traMADol, amLODipine, Azilsartan-Chlorthalidone, cyclobenzaprine, rivaroxaban, famotidine, Diclofenac Sodium, and VOLTAREN.  No orders of the defined types were placed in this encounter.     Follow-up: No Follow-up on file.  Sonda Primes, MD

## 2016-04-11 ENCOUNTER — Encounter: Payer: Self-pay | Admitting: Internal Medicine

## 2016-04-11 ENCOUNTER — Ambulatory Visit (INDEPENDENT_AMBULATORY_CARE_PROVIDER_SITE_OTHER): Payer: Medicare HMO | Admitting: Internal Medicine

## 2016-04-11 VITALS — BP 154/82 | HR 56 | Temp 98.3°F | Resp 16 | Wt 166.0 lb

## 2016-04-11 DIAGNOSIS — R600 Localized edema: Secondary | ICD-10-CM | POA: Diagnosis not present

## 2016-04-11 DIAGNOSIS — J069 Acute upper respiratory infection, unspecified: Secondary | ICD-10-CM

## 2016-04-11 DIAGNOSIS — I10 Essential (primary) hypertension: Secondary | ICD-10-CM | POA: Diagnosis not present

## 2016-04-11 MED ORDER — LOSARTAN POTASSIUM-HCTZ 50-12.5 MG PO TABS: 1.0000 | ORAL_TABLET | Freq: Every day | ORAL | Status: DC

## 2016-04-11 NOTE — Assessment & Plan Note (Signed)
Stopped taking edarbyclor due to expense and not compliant with a low sodium diet Elevate legs when able Low sodium diet Start losartan - hctz 50-12.5  -- may need a higher dose of a water pill, but her kidney function is slightly reduced so will start low Has a cardio f/u appt next month

## 2016-04-11 NOTE — Assessment & Plan Note (Signed)
Elevated BP today Stopped taking azilsartan-chlorthalidone due to expense, has been taking amlodipine and lopressor Not compliant with a low sodium diet Stressed low sodium diet Will start losartan-hctz 50-12.5mg  daily -- may need a higher dose Will see cardio next month Advised to try to check BP at home or pharmacy

## 2016-04-11 NOTE — Assessment & Plan Note (Signed)
Still on the antibiotic Some wheeze on exam - if no improvement will need to be seen - may be related to infection, less likely fluid overload

## 2016-04-11 NOTE — Progress Notes (Signed)
Pre visit review using our clinic review tool, if applicable. No additional management support is needed unless otherwise documented below in the visit note. 

## 2016-04-11 NOTE — Patient Instructions (Signed)
   Medications reviewed and updated.  Changes include adding losartan - hydrochlorothiazide 50-12.5 mg daily.  Continue the metoprolol and amlodipine at your current doses.  Your prescription(s) have been submitted to your pharmacy. Please take as directed and contact our office if you believe you are having problem(s) with the medication(s).

## 2016-04-11 NOTE — Progress Notes (Signed)
Subjective:    Patient ID: Alyssa Lindsey, female    DOB: 09-16-1929, 82 y.o.   MRN: [id number]  HPI  She is here for an acute visit.   She was seen last week and is on antibiotics for a upper respiratory infection.  She still has a cough and her family has heard an occasional wheeze.  She denies fevers.   She has had b/l leg swelling for couple of months.  It is her feet to mid lower leg.  She denies changes in activity, diet, supplements and medication initially, but then revealed that she is not taking the azilsartan-chlorthalidone because it is too expensive.  She is taking the amlodipine and metoprolol as prescribed.  She does not monitor her BP at home.  She is not as compliant with a low sodium diet as she should be.  She denies chest pain, sob and palpitations.    Medications and allergies reviewed with patient and updated if appropriate.  Patient Active Problem List   Diagnosis Date Noted  . Edema 04/06/2016  . Acute URI 04/06/2016  . Routine general medical examination at a health care facility 12/26/2015  . GERD (gastroesophageal reflux disease) 03/02/2015  . Paroxysmal a-fib (HCC) 03/04/2014  . Chronic LLQ pain 02/07/2012  . VITAMIN B12 DEFICIENCY 03/06/2010  . 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 NECK PAIN, CHRONIC 03/22/2008  . THYROID NODULE 03/03/2008  . BREAST MASS 03/03/2008  . Diabetes type 2, controlled (HCC) 06/21/2007  . Essential hypertension 06/21/2007    Current Outpatient Prescriptions on File Prior to Visit  Medication Sig Dispense Refill  . amLODipine (NORVASC) 10 MG tablet Take 1 tablet (10 mg total) by mouth daily. 30 tablet 9  . Azilsartan-Chlorthalidone (EDARBYCLOR) 40-12.5 MG TABS Take 1 tablet by mouth daily. 30 tablet 11  . cefdinir (OMNICEF) 300 MG capsule Take 1 capsule (300 mg total) by mouth 2 (two) times daily. 14 capsule 0  . cetirizine (ZYRTEC) 10 MG tablet Take 1 tablet (10 mg total) by mouth daily. 30 tablet 11  .  cyclobenzaprine (FLEXERIL) 10 MG tablet TAKE ONE-HALF TO ONE TABLET BY MOUTH THREE TIMES DAILY AS NEEDED FOR MUSCLE SPASMS**WATCH OUT FOR SEDATION AND CAREFUL OF FALLS** 30 tablet 0  . Diclofenac Sodium 2 % SOLN Place 1 application onto the skin 2 (two) times daily. 1 Bottle 1  . famotidine (PEPCID) 20 MG tablet TAKE ONE TABLET BY MOUTH TWICE DAILY 60 tablet 2  . metoprolol tartrate (LOPRESSOR) 25 MG tablet TAKE ONE-HALF TABLET BY MOUTH TWICE DAILY 60 tablet 4  . promethazine-codeine (PHENERGAN WITH CODEINE) 6.25-10 MG/5ML syrup Take 5 mLs by mouth every 4 (four) hours as needed. 300 mL 0  . rivaroxaban (XARELTO) 20 MG TABS tablet Take 1 tablet (20 mg total) by mouth daily with supper. 35 tablet 0  . traMADol (ULTRAM) 50 MG tablet Take 1 tablet (50 mg total) by mouth every 8 (eight) hours as needed. 60 tablet 0  . VOLTAREN 1 % GEL APPLY TWO GRAMS THREE TIMES DAILY AS NEEDED 100 g 3   No current facility-administered medications on file prior to visit.    Past Medical History  Diagnosis Date  . Type II or unspecified type diabetes mellitus without mention of complication, not stated as uncontrolled   . HTN (hypertension)   . LBP (low back pain)   . Chronic neck pain   . Spondylosis   . Arthritis   . Diverticulosis of colon (without mention of  hemorrhage)   . Hiatal hernia   . Gastritis   . Family history of malignant neoplasm of gastrointestinal tract   . Memory problem     Past Surgical History  Procedure Laterality Date  . Tubal ligation    . Colonoscopy  2010    NORMAL-- DUE 2020    Social History   Social History  . Marital Status: Married    Spouse Name: N/A  . Number of Children: 4  . Years of Education: N/A   Occupational History  . Retired Advertising copywriter    Social History Main Topics  . Smoking status: Never Smoker   . Smokeless tobacco: Never Used  . Alcohol Use: No  . Drug Use: No  . Sexual Activity: Not on file   Other Topics Concern  . Not on file    Social History Narrative   Widow    4 children: 1 son '59; 3 dtrs '53, '62, '64; 9 grandchildren; great-grand   Lives with dtr at home, grandson at home      Daily Caffeine Use:  1 cup   Regular Exercise -  NO             Family History  Problem Relation Age of Onset  . Stroke Father 15  . Ovarian cancer Sister   . Breast cancer Sister   . Breast cancer Sister   . Liver disease Brother   . Colon cancer Daughter   . Diabetes Daughter   . Throat cancer Brother   . Emphysema Brother   . Lung cancer Daughter     Review of Systems  Constitutional: Negative for fever.  Respiratory: Positive for cough and wheezing. Negative for shortness of breath.   Cardiovascular: Positive for leg swelling. Negative for chest pain and palpitations.  Neurological: Negative for light-headedness and headaches.       Objective:   Filed Vitals:   04/11/16 1050  BP: 154/82  Pulse: 56  Temp: 98.3 F (36.8 C)  Resp: 16   Filed Weights   04/11/16 1050  Weight: 166 lb (75.297 kg)   Body mass index is 27.62 kg/(m^2).   Physical Exam Constitutional: Appears well-developed and well-nourished. No distress.  Neck: Neck supple. No tracheal deviation present. No thyromegaly present.  No carotid bruit. No cervical adenopathy.   Cardiovascular: Normal rate, regular rhythm and normal heart sounds.   No murmur heard.  1+ nonpitting edema b/l LE up to mid lower leg Pulmonary/Chest: Effort normal.  No respiratory distress. Mild wheezes right side > left with expiration        Assessment & Plan:   See Problem List for Assessment and Plan of chronic medical problems.  Has follow up with cardiology next Month and PCP in July

## 2016-04-12 DIAGNOSIS — Z0000 Encounter for general adult medical examination without abnormal findings: Secondary | ICD-10-CM | POA: Diagnosis not present

## 2016-04-12 DIAGNOSIS — I10 Essential (primary) hypertension: Secondary | ICD-10-CM | POA: Diagnosis not present

## 2016-04-12 DIAGNOSIS — K219 Gastro-esophageal reflux disease without esophagitis: Secondary | ICD-10-CM | POA: Diagnosis not present

## 2016-04-25 ENCOUNTER — Encounter: Payer: Self-pay | Admitting: Family

## 2016-04-25 ENCOUNTER — Ambulatory Visit (INDEPENDENT_AMBULATORY_CARE_PROVIDER_SITE_OTHER): Payer: Medicare HMO | Admitting: Family

## 2016-04-25 VITALS — BP 138/68 | HR 48 | Temp 97.4°F | Resp 16 | Ht 65.0 in | Wt 162.8 lb

## 2016-04-25 DIAGNOSIS — R202 Paresthesia of skin: Secondary | ICD-10-CM

## 2016-04-25 DIAGNOSIS — R600 Localized edema: Secondary | ICD-10-CM | POA: Diagnosis not present

## 2016-04-25 DIAGNOSIS — R200 Anesthesia of skin: Secondary | ICD-10-CM | POA: Insufficient documentation

## 2016-04-25 NOTE — Progress Notes (Signed)
Pre visit review using our clinic review tool, if applicable. No additional management support is needed unless otherwise documented below in the visit note. 

## 2016-04-25 NOTE — Progress Notes (Signed)
Subjective:    Patient ID: Alyssa Lindsey, female    DOB: October 06, 1929, 82 y.o.   MRN: [id number]  Chief Complaint  Patient presents with  . Leg Swelling    has been having swelling in ankles and legs since last week and she states the bottom of her feet feel funny    HPI:  Alyssa Lindsey is a 82 y.o. female who  has a past medical history of Type II or unspecified type diabetes mellitus without mention of complication, not stated as uncontrolled; HTN (hypertension); LBP (low back pain); Chronic neck pain; Spondylosis; Arthritis; Diverticulosis of colon (without mention of hemorrhage); Hiatal hernia; Gastritis; Family history of malignant neoplasm of gastrointestinal tract; and Memory problem. and presents today for a follow up office visit.  Previously evaluated in the office and noted to have Bilateral lower extremity edema. She had stopped taking her Edarbyclor  due to expense and was not compliant with a low-sodium diet. She was instructed at the time to elevate her legs and started on Hyzaar. Continues to experience the associated symptom of lower extremity edema and notes that her feet feel a slight numbness and tingling. The numbness and tingling has been going on for about 2 weeks. Reports taking the medication as prescribed and denies adverse side effects. States she has tried to cut back on her salt. Has noted some improvement in her lower legs.  Allergies  Allergen Reactions  . Dicyclomine Hcl Other (See Comments)    Severe eye pain  . Lisinopril Cough  . Aspirin Other (See Comments)    Unknown reaction     Current Outpatient Prescriptions on File Prior to Visit  Medication Sig Dispense Refill  . amLODipine (NORVASC) 10 MG tablet Take 1 tablet (10 mg total) by mouth daily. 30 tablet 9  . cefdinir (OMNICEF) 300 MG capsule Take 1 capsule (300 mg total) by mouth 2 (two) times daily. 14 capsule 0  . cetirizine (ZYRTEC) 10 MG tablet Take 1 tablet (10 mg total) by mouth daily.  30 tablet 11  . cyclobenzaprine (FLEXERIL) 10 MG tablet TAKE ONE-HALF TO ONE TABLET BY MOUTH THREE TIMES DAILY AS NEEDED FOR MUSCLE SPASMS**WATCH OUT FOR SEDATION AND CAREFUL OF FALLS** 30 tablet 0  . Diclofenac Sodium 2 % SOLN Place 1 application onto the skin 2 (two) times daily. 1 Bottle 1  . famotidine (PEPCID) 20 MG tablet TAKE ONE TABLET BY MOUTH TWICE DAILY 60 tablet 2  . losartan-hydrochlorothiazide (HYZAAR) 50-12.5 MG tablet Take 1 tablet by mouth daily. 30 tablet 3  . metoprolol tartrate (LOPRESSOR) 25 MG tablet TAKE ONE-HALF TABLET BY MOUTH TWICE DAILY 60 tablet 4  . promethazine-codeine (PHENERGAN WITH CODEINE) 6.25-10 MG/5ML syrup Take 5 mLs by mouth every 4 (four) hours as needed. 300 mL 0  . rivaroxaban (XARELTO) 20 MG TABS tablet Take 1 tablet (20 mg total) by mouth daily with supper. 35 tablet 0  . traMADol (ULTRAM) 50 MG tablet Take 1 tablet (50 mg total) by mouth every 8 (eight) hours as needed. 60 tablet 0  . VOLTAREN 1 % GEL APPLY TWO GRAMS THREE TIMES DAILY AS NEEDED 100 g 3   No current facility-administered medications on file prior to visit.     Past Surgical History  Procedure Laterality Date  . Tubal ligation    . Colonoscopy  2010    NORMAL-- DUE 2020    Past Medical History  Diagnosis Date  . Type II or unspecified type diabetes mellitus without  mention of complication, not stated as uncontrolled   . HTN (hypertension)   . LBP (low back pain)   . Chronic neck pain   . Spondylosis   . Arthritis   . Diverticulosis of colon (without mention of hemorrhage)   . Hiatal hernia   . Gastritis   . Family history of malignant neoplasm of gastrointestinal tract   . Memory problem       Review of Systems  Constitutional: Negative for fever and chills.  Respiratory: Negative for chest tightness and shortness of breath.   Cardiovascular: Negative for chest pain, palpitations and leg swelling.      Objective:    BP 138/68 mmHg  Pulse 48  Temp(Src) 97.4 F  (36.3 C) (Oral)  Resp 16  Ht 5\' 5"  (1.651 m)  Wt 162 lb 12.8 oz (73.846 kg)  BMI 27.09 kg/m2  SpO2 98% Nursing note and vital signs reviewed.  Physical Exam  Constitutional: She is oriented to person, place, and time. She appears well-developed and well-nourished. No distress.  Cardiovascular: Normal rate, regular rhythm, normal heart sounds and intact distal pulses.   Mild nonpitting edema noted bilaterally.  Pulmonary/Chest: Effort normal and breath sounds normal. No respiratory distress. She has no wheezes. She has no rales.  Neurological: She is alert and oriented to person, place, and time.  Skin: Skin is warm and dry.  Psychiatric: She has a normal mood and affect. Her behavior is normal. Judgment and thought content normal.       Assessment & Plan:   Problem List Items Addressed This Visit      Other   Bilateral leg edema - Primary    Bilateral lower extremity edema appears improved with losartan-hydrochlorothiazide with no adverse side effects. Does express snoring at night and occasional daytime sleepiness with concern for possible sleep apnea as a contributing factor. Continue to elevate legs when seated, decrease sodium in diet, and consider compression socks as needed. If symptoms worsen or do not improve consider additional testing as indicated.      Relevant Orders   Comprehensive metabolic panel   [id number] and Folate Panel   Numbness and tingling of both legs    Numbness and tingling of undetermined origin obtain B12/folate, comprehensive metabolic panel, and hemoglobin A1c to rule out metabolic causes. Denies back pain. Follow-up and additional treatment pending blood work results.      Relevant Orders   Comprehensive metabolic panel   [id number] and Folate Panel   Hemoglobin A1c       I am having Ms. Lyda JesterCurtis maintain her metoprolol tartrate, cetirizine, traMADol, amLODipine, cyclobenzaprine, rivaroxaban, famotidine, Diclofenac Sodium, VOLTAREN, cefdinir,  promethazine-codeine, and losartan-hydrochlorothiazide.   Follow-up: Return if symptoms worsen or fail to improve.  Jeanine Luzalone, Gregory, FNP

## 2016-04-25 NOTE — Assessment & Plan Note (Signed)
Bilateral lower extremity edema appears improved with losartan-hydrochlorothiazide with no adverse side effects. Does express snoring at night and occasional daytime sleepiness with concern for possible sleep apnea as a contributing factor. Continue to elevate legs when seated, decrease sodium in diet, and consider compression socks as needed. If symptoms worsen or do not improve consider additional testing as indicated.

## 2016-04-25 NOTE — Patient Instructions (Addendum)
Thank you for choosing [facility].  Summary/Instructions:  Please continue to take your medications as prescribed.  Continue to follow a low sodium / salt diet.   Please stop by the lab on the basement level of the building for your blood work. Your results will be released to MyChart (or called to you) after review, usually within 72 hours after test completion. If any changes need to be made, you will be notified at that same time.  If your symptoms worsen or fail to improve, please contact our office for further instruction, or in case of emergency go directly to the emergency room at the closest medical facility.

## 2016-04-25 NOTE — Assessment & Plan Note (Signed)
Numbness and tingling of undetermined origin obtain B12/folate, comprehensive metabolic panel, and hemoglobin A1c to rule out metabolic causes. Denies back pain. Follow-up and additional treatment pending blood work results.

## 2016-04-27 ENCOUNTER — Other Ambulatory Visit (INDEPENDENT_AMBULATORY_CARE_PROVIDER_SITE_OTHER): Payer: Medicare HMO

## 2016-04-27 DIAGNOSIS — R202 Paresthesia of skin: Secondary | ICD-10-CM

## 2016-04-27 DIAGNOSIS — R200 Anesthesia of skin: Secondary | ICD-10-CM

## 2016-04-27 DIAGNOSIS — R600 Localized edema: Secondary | ICD-10-CM

## 2016-04-27 LAB — COMPREHENSIVE METABOLIC PANEL
ALK PHOS: 80 U/L (ref 39–117)
ALT: 8 U/L (ref 0–35)
AST: 15 U/L (ref 0–37)
Albumin: 4.3 g/dL (ref 3.5–5.2)
BILIRUBIN TOTAL: 0.4 mg/dL (ref 0.2–1.2)
BUN: 34 mg/dL — ABNORMAL HIGH (ref 6–23)
CALCIUM: 9.8 mg/dL (ref 8.4–10.5)
CO2: 26 mEq/L (ref 19–32)
Chloride: 108 mEq/L (ref 96–112)
Creatinine, Ser: 1.47 mg/dL — ABNORMAL HIGH (ref 0.40–1.20)
GFR: 43.44 mL/min — AB (ref >60.00)
Glucose, Bld: 97 mg/dL (ref 70–99)
POTASSIUM: 4.4 meq/L (ref 3.5–5.1)
Sodium: 144 mEq/L (ref 135–145)
TOTAL PROTEIN: 7.7 g/dL (ref 6.0–8.3)

## 2016-04-27 LAB — B12 AND FOLATE PANEL: VITAMIN B 12: 344 pg/mL (ref 211–911)

## 2016-04-27 LAB — HEMOGLOBIN A1C: HEMOGLOBIN A1C: 6.4 % (ref 4.6–6.5)

## 2016-04-29 ENCOUNTER — Encounter: Payer: Self-pay | Admitting: Family

## 2016-05-09 ENCOUNTER — Ambulatory Visit (INDEPENDENT_AMBULATORY_CARE_PROVIDER_SITE_OTHER): Payer: Medicare HMO | Admitting: Family

## 2016-05-09 ENCOUNTER — Encounter: Payer: Self-pay | Admitting: Family

## 2016-05-09 ENCOUNTER — Other Ambulatory Visit: Payer: Medicare HMO

## 2016-05-09 VITALS — BP 140/82 | HR 48 | Temp 98.8°F | Ht 65.0 in | Wt 164.8 lb

## 2016-05-09 DIAGNOSIS — R600 Localized edema: Secondary | ICD-10-CM | POA: Diagnosis not present

## 2016-05-09 DIAGNOSIS — R350 Frequency of micturition: Secondary | ICD-10-CM | POA: Diagnosis not present

## 2016-05-09 DIAGNOSIS — M545 Low back pain, unspecified: Secondary | ICD-10-CM

## 2016-05-09 LAB — POC URINALSYSI DIPSTICK (AUTOMATED)
Bilirubin, UA: NEGATIVE
Blood, UA: NEGATIVE
Glucose, UA: NEGATIVE
Ketones, UA: NEGATIVE
LEUKOCYTES UA: NEGATIVE
NITRITE UA: NEGATIVE
PH UA: 6
PROTEIN UA: NEGATIVE
Spec Grav, UA: 1.025
UROBILINOGEN UA: 0.2

## 2016-05-09 NOTE — Progress Notes (Signed)
Pre visit review using our clinic review tool, if applicable. No additional management support is needed unless otherwise documented below in the visit note. 

## 2016-05-09 NOTE — Progress Notes (Signed)
Subjective:    Patient ID: Alyssa Lindsey, female    DOB: 20-Mar-1929, 82 y.o.   MRN: [id number]   Alyssa PerchesDorothy B Lindsey is a 82 y.o. female who presents today for an acute visit.    HPI Comments: Patient here for reevaluation of mid to low back has been hurting for about a couple months, unchanged. Mild to moderate. Pain is in low back and radiates to bilateral buttucks. Endorses numbness and tingling in BLE from DM. No lower extremity weakness, falls, injury, history of cancer, saddle anesthesia, or urinary incontinence  Also here for evaluation of urinary frequency for one day. No hematuria, chills, fever, dysuria.   She also complains of bilateral feet swelling. Swelling doesn't get better or worse, stays the same. No h/o HF. No SOB, cough, orthopnea.     I saw this patient a month and a half ago for SI joint pain. At that time she is given a sample of Pennsaid as to trial for pain. Advised her if pain recurs she was see sports medicine. Past Medical History  Diagnosis Date  . Type II or unspecified type diabetes mellitus without mention of complication, not stated as uncontrolled   . HTN (hypertension)   . LBP (low back pain)   . Chronic neck pain   . Spondylosis   . Arthritis   . Diverticulosis of colon (without mention of hemorrhage)   . Hiatal hernia   . Gastritis   . Family history of malignant neoplasm of gastrointestinal tract   . Memory problem    Allergies: Dicyclomine hcl; Lisinopril; and Aspirin Current Outpatient Prescriptions on File Prior to Visit  Medication Sig Dispense Refill  . amLODipine (NORVASC) 10 MG tablet Take 1 tablet (10 mg total) by mouth daily. 30 tablet 9  . cetirizine (ZYRTEC) 10 MG tablet Take 1 tablet (10 mg total) by mouth daily. 30 tablet 11  . cyclobenzaprine (FLEXERIL) 10 MG tablet TAKE ONE-HALF TO ONE TABLET BY MOUTH THREE TIMES DAILY AS NEEDED FOR MUSCLE SPASMS**WATCH OUT FOR SEDATION AND CAREFUL OF FALLS** 30 tablet 0  . famotidine (PEPCID)  20 MG tablet TAKE ONE TABLET BY MOUTH TWICE DAILY 60 tablet 2  . losartan-hydrochlorothiazide (HYZAAR) 50-12.5 MG tablet Take 1 tablet by mouth daily. 30 tablet 3  . metoprolol tartrate (LOPRESSOR) 25 MG tablet TAKE ONE-HALF TABLET BY MOUTH TWICE DAILY 60 tablet 4  . rivaroxaban (XARELTO) 20 MG TABS tablet Take 1 tablet (20 mg total) by mouth daily with supper. 35 tablet 0  . traMADol (ULTRAM) 50 MG tablet Take 1 tablet (50 mg total) by mouth every 8 (eight) hours as needed. 60 tablet 0  . Diclofenac Sodium 2 % SOLN Place 1 application onto the skin 2 (two) times daily. (Patient not taking: Reported on 05/09/2016) 1 Bottle 1  . VOLTAREN 1 % GEL APPLY TWO GRAMS THREE TIMES DAILY AS NEEDED (Patient not taking: Reported on 05/09/2016) 100 g 3   No current facility-administered medications on file prior to visit.    Social History  Substance Use Topics  . Smoking status: Never Smoker   . Smokeless tobacco: Never Used  . Alcohol Use: No    Review of Systems  Constitutional: Negative for fever and chills.  Respiratory: Negative for cough, shortness of breath and wheezing.   Cardiovascular: Positive for leg swelling. Negative for chest pain and palpitations.  Gastrointestinal: Negative for nausea and vomiting.  Genitourinary: Positive for frequency. Negative for hematuria, flank pain and difficulty urinating.  Objective:    BP 140/82 mmHg  Pulse 48  Temp(Src) 98.8 F (37.1 C) (Oral)  Ht  (1.651 m)  Wt 164 lb 12 oz (74.73 kg)  BMI 27.42 kg/m2  SpO2 94%   Physical Exam  Constitutional: She appears well-developed and well-nourished.  Cardiovascular: Normal rate, regular rhythm, normal heart sounds and normal pulses.   +1 LE edema, non pitting, dorsal aspect of bilateral feet and ankles. No palpable cords or masses in calves. No erythema or increased warmth. Negative Homan sign bilaterally.  LE hair growth symmetric and present. No discoloration of varicosities noted. LE warm  and palpable pedal pulses.   Pulmonary/Chest: Effort normal and breath sounds normal. She has no wheezes. She has no rhonchi. She has no rales.  Abdominal: There is no CVA tenderness.  Musculoskeletal:       Thoracic back: She exhibits tenderness. She exhibits normal range of motion, no bony tenderness, no deformity, no pain and no spasm.       Back:  Diffuse, mild tenderness over thoracic spine as noted on diagram.    Neurological: She is alert.  Skin: Skin is warm and dry.  Psychiatric: She has a normal mood and affect. Her speech is normal and behavior is normal. Thought content normal.  Vitals reviewed.      Assessment & Plan:   .1. Bilateral leg edema Mild, non pitting. No symptoms of fluid overload. Lung sounds are clear and patient complains of no shortness of breath, or cough. Patient agreed on conservative management with compression hose and elevation of legs.  2. Bilateral low back pain without sciatica  No acute symptoms. Acute on chronic.  Poor renal function , poor candidate for NSAIDs. Patient and I agreed on conservative therapy at this time. Exercises given. Referral to PT and sport medicine for possible injection.   3. Urinary frequency UA negative for leukocytes, nitrites, blood, glucose, ketones. We'll send for culture and treat if positive at that time.  Patient agreed with this plan.    I have discontinued Ms. Warren's cefdinir and promethazine-codeine. I am also having her maintain her metoprolol tartrate, cetirizine, traMADol, amLODipine, cyclobenzaprine, rivaroxaban, famotidine, Diclofenac Sodium, VOLTAREN, and losartan-hydrochlorothiazide.   No orders of the defined types were placed in this encounter.     Start medications as prescribed and explained to patient on After Visit Summary ( AVS). Risks, benefits, and alternatives of the medications and treatment plan prescribed today were discussed, and patient expressed understanding.   Education  regarding symptom management and diagnosis given to patient.   Follow-up:Plan follow-up and return precautions given if any worsening symptoms or change in condition.   Continue to follow with Myrlene Broker, MD for routine health maintenance.   Alyssa Perches and I agreed with plan.   Rennie Plowman, FNP

## 2016-05-09 NOTE — Patient Instructions (Signed)
Referral to physical therapy and sports medicine.  If there is no improvement in your symptoms, or if there is any worsening of symptoms, or if you have any additional concerns, please return for re-evaluation; or, if we are closed, consider going to the Emergency Room for evaluation if symptoms urgent.  Low Back Sprain With Rehab A sprain is an injury in which a ligament is torn. The ligaments of the lower back are vulnerable to sprains. However, they are strong and require great force to be injured. These ligaments are important for stabilizing the spinal column. Sprains are classified into three categories. Grade 1 sprains cause pain, but the tendon is not lengthened. Grade 2 sprains include a lengthened ligament, due to the ligament being stretched or partially ruptured. With grade 2 sprains there is still function, although the function may be decreased. Grade 3 sprains involve a complete tear of the tendon or muscle, and function is usually impaired. SYMPTOMS   Severe pain in the lower back.  Sometimes, a feeling of a "pop," "snap," or tear, at the time of injury.  Tenderness and sometimes swelling at the injury site.  Uncommonly, bruising (contusion) within 48 hours of injury.  Muscle spasms in the back. CAUSES  Low back sprains occur when a force is placed on the ligaments that is greater than they can handle. Common causes of injury include:  Performing a stressful act while off-balance.  Repetitive stressful activities that involve movement of the lower back.  Direct hit (trauma) to the lower back. RISK INCREASES WITH:  Contact sports (football, wrestling).  Collisions (major skiing accidents).  Sports that require throwing or lifting (baseball, weightlifting).  Sports involving twisting of the spine (gymnastics, diving, tennis, golf).  Poor strength and flexibility.  Inadequate protection.  Previous back injury or surgery (especially fusion). PREVENTION  Wear  properly fitted and padded protective equipment.  Warm up and stretch properly before activity.  Allow for adequate recovery between workouts.  Maintain physical fitness:  Strength, flexibility, and endurance.  Cardiovascular fitness.  Maintain a healthy body weight. PROGNOSIS  If treated properly, low back sprains usually heal with non-surgical treatment. The length of time for healing depends on the severity of the injury.  RELATED COMPLICATIONS   Recurring symptoms, resulting in a chronic problem.  Chronic inflammation and pain in the low back.  Delayed healing or resolution of symptoms, especially if activity is resumed too soon.  Prolonged impairment.  Unstable or arthritic joints of the low back. TREATMENT  Treatment first involves the use of ice and medicine, to reduce pain and inflammation. The use of strengthening and stretching exercises may help reduce pain with activity. These exercises may be performed at home or with a therapist. Severe injuries may require referral to a therapist for further evaluation and treatment, such as ultrasound. Your caregiver may advise that you wear a back brace or corset, to help reduce pain and discomfort. Often, prolonged bed rest results in greater harm then benefit. Corticosteroid injections may be recommended. However, these should be reserved for the most serious cases. It is important to avoid using your back when lifting objects. At night, sleep on your back on a firm mattress, with a pillow placed under your knees. If non-surgical treatment is unsuccessful, surgery may be needed.  MEDICATION   If pain medicine is needed, nonsteroidal anti-inflammatory medicines (aspirin and ibuprofen), or other minor pain relievers (acetaminophen), are often advised.  Do not take pain medicine for 7 days before surgery.  Prescription  pain relievers may be given, if your caregiver thinks they are needed. Use only as directed and only as much as you  need.  Ointments applied to the skin may be helpful.  Corticosteroid injections may be given by your caregiver. These injections should be reserved for the most serious cases, because they may only be given a certain number of times. HEAT AND COLD  Cold treatment (icing) should be applied for 10 to 15 minutes every 2 to 3 hours for inflammation and pain, and immediately after activity that aggravates your symptoms. Use ice packs or an ice massage.  Heat treatment may be used before performing stretching and strengthening activities prescribed by your caregiver, physical therapist, or athletic trainer. Use a heat pack or a warm water soak. SEEK MEDICAL CARE IF:   Symptoms get worse or do not improve in 2 to 4 weeks, despite treatment.  You develop numbness or weakness in either leg.  You lose bowel or bladder function.  Any of the following occur after surgery: fever, increased pain, swelling, redness, drainage of fluids, or bleeding in the affected area.  New, unexplained symptoms develop. (Drugs used in treatment may produce side effects.) EXERCISES  RANGE OF MOTION (ROM) AND STRETCHING EXERCISES - Low Back Sprain Most people with lower back pain will find that their symptoms get worse with excessive bending forward (flexion) or arching at the lower back (extension). The exercises that will help resolve your symptoms will focus on the opposite motion.  Your physician, physical therapist or athletic trainer will help you determine which exercises will be most helpful to resolve your lower back pain. Do not complete any exercises without first consulting with your caregiver. Discontinue any exercises which make your symptoms worse, until you speak to your caregiver. If you have pain, numbness or tingling which travels down into your buttocks, leg or foot, the goal of the therapy is for these symptoms to move closer to your back and eventually resolve. Sometimes, these leg symptoms will get  better, but your lower back pain may worsen. This is often an indication of progress in your rehabilitation. Be very alert to any changes in your symptoms and the activities in which you participated in the 24 hours prior to the change. Sharing this information with your caregiver will allow him or her to most efficiently treat your condition. These exercises may help you when beginning to rehabilitate your injury. Your symptoms may resolve with or without further involvement from your physician, physical therapist or athletic trainer. While completing these exercises, remember:   Restoring tissue flexibility helps normal motion to return to the joints. This allows healthier, less painful movement and activity.  An effective stretch should be held for at least 30 seconds.  A stretch should never be painful. You should only feel a gentle lengthening or release in the stretched tissue. FLEXION RANGE OF MOTION AND STRETCHING EXERCISES: STRETCH - Flexion, Single Knee to Chest   Lie on a firm bed or floor with both legs extended in front of you.  Keeping one leg in contact with the floor, bring your opposite knee to your chest. Hold your leg in place by either grabbing behind your thigh or at your knee.  Pull until you feel a gentle stretch in your low back. Hold __________ seconds.  Slowly release your grasp and repeat the exercise with the opposite side. Repeat __________ times. Complete this exercise __________ times per day.  STRETCH - Flexion, Double Knee to Chest  Lorenz CoasterLie  on a firm bed or floor with both legs extended in front of you.  Keeping one leg in contact with the floor, bring your opposite knee to your chest.  Tense your stomach muscles to support your back and then lift your other knee to your chest. Hold your legs in place by either grabbing behind your thighs or at your knees.  Pull both knees toward your chest until you feel a gentle stretch in your low back. Hold __________  seconds.  Tense your stomach muscles and slowly return one leg at a time to the floor. Repeat __________ times. Complete this exercise __________ times per day.  STRETCH - Low Trunk Rotation  Lie on a firm bed or floor. Keeping your legs in front of you, bend your knees so they are both pointed toward the ceiling and your feet are flat on the floor.  Extend your arms out to the side. This will stabilize your upper body by keeping your shoulders in contact with the floor.  Gently and slowly drop both knees together to one side until you feel a gentle stretch in your low back. Hold for __________ seconds.  Tense your stomach muscles to support your lower back as you bring your knees back to the starting position. Repeat the exercise to the other side. Repeat __________ times. Complete this exercise __________ times per day  EXTENSION RANGE OF MOTION AND FLEXIBILITY EXERCISES: STRETCH - Extension, Prone on Elbows   Lie on your stomach on the floor, a bed will be too soft. Place your palms about shoulder width apart and at the height of your head.  Place your elbows under your shoulders. If this is too painful, stack pillows under your chest.  Allow your body to relax so that your hips drop lower and make contact more completely with the floor.  Hold this position for __________ seconds.  Slowly return to lying flat on the floor. Repeat __________ times. Complete this exercise __________ times per day.  RANGE OF MOTION - Extension, Prone Press Ups  Lie on your stomach on the floor, a bed will be too soft. Place your palms about shoulder width apart and at the height of your head.  Keeping your back as relaxed as possible, slowly straighten your elbows while keeping your hips on the floor. You may adjust the placement of your hands to maximize your comfort. As you gain motion, your hands will come more underneath your shoulders.  Hold this position __________ seconds.  Slowly return to  lying flat on the floor. Repeat __________ times. Complete this exercise __________ times per day.  RANGE OF MOTION- Quadruped, Neutral Spine   Assume a hands and knees position on a firm surface. Keep your hands under your shoulders and your knees under your hips. You may place padding under your knees for comfort.  Drop your head and point your tailbone toward the ground below you. This will round out your lower back like an angry cat. Hold this position for __________ seconds.  Slowly lift your head and release your tail bone so that your back sags into a large arch, like an old horse.  Hold this position for __________ seconds.  Repeat this until you feel limber in your low back.  Now, find your "sweet spot." This will be the most comfortable position somewhere between the two previous positions. This is your neutral spine. Once you have found this position, tense your stomach muscles to support your low back.  Hold this  position for __________ seconds. Repeat __________ times. Complete this exercise __________ times per day.  STRENGTHENING EXERCISES - Low Back Sprain These exercises may help you when beginning to rehabilitate your injury. These exercises should be done near your "sweet spot." This is the neutral, low-back arch, somewhere between fully rounded and fully arched, that is your least painful position. When performed in this safe range of motion, these exercises can be used for people who have either a flexion or extension based injury. These exercises may resolve your symptoms with or without further involvement from your physician, physical therapist or athletic trainer. While completing these exercises, remember:   Muscles can gain both the endurance and the strength needed for everyday activities through controlled exercises.  Complete these exercises as instructed by your physician, physical therapist or athletic trainer. Increase the resistance and repetitions only as  guided.  You may experience muscle soreness or fatigue, but the pain or discomfort you are trying to eliminate should never worsen during these exercises. If this pain does worsen, stop and make certain you are following the directions exactly. If the pain is still present after adjustments, discontinue the exercise until you can discuss the trouble with your caregiver. STRENGTHENING - Deep Abdominals, Pelvic Tilt   Lie on a firm bed or floor. Keeping your legs in front of you, bend your knees so they are both pointed toward the ceiling and your feet are flat on the floor.  Tense your lower abdominal muscles to press your low back into the floor. This motion will rotate your pelvis so that your tail bone is scooping upwards rather than pointing at your feet or into the floor. With a gentle tension and even breathing, hold this position for __________ seconds. Repeat __________ times. Complete this exercise __________ times per day.  STRENGTHENING - Abdominals, Crunches   Lie on a firm bed or floor. Keeping your legs in front of you, bend your knees so they are both pointed toward the ceiling and your feet are flat on the floor. Cross your arms over your chest.  Slightly tip your chin down without bending your neck.  Tense your abdominals and slowly lift your trunk high enough to just clear your shoulder blades. Lifting higher can put excessive stress on the lower back and does not further strengthen your abdominal muscles.  Control your return to the starting position. Repeat __________ times. Complete this exercise __________ times per day.  STRENGTHENING - Quadruped, Opposite UE/LE Lift   Assume a hands and knees position on a firm surface. Keep your hands under your shoulders and your knees under your hips. You may place padding under your knees for comfort.  Find your neutral spine and gently tense your abdominal muscles so that you can maintain this position. Your shoulders and hips  should form a rectangle that is parallel with the floor and is not twisted.  Keeping your trunk steady, lift your right hand no higher than your shoulder and then your left leg no higher than your hip. Make sure you are not holding your breath. Hold this position for __________ seconds.  Continuing to keep your abdominal muscles tense and your back steady, slowly return to your starting position. Repeat with the opposite arm and leg. Repeat __________ times. Complete this exercise __________ times per day.  STRENGTHENING - Abdominals and Quadriceps, Straight Leg Raise   Lie on a firm bed or floor with both legs extended in front of you.  Keeping one leg in  contact with the floor, bend the other knee so that your foot can rest flat on the floor.  Find your neutral spine, and tense your abdominal muscles to maintain your spinal position throughout the exercise.  Slowly lift your straight leg off the floor about 6 inches for a count of 15, making sure to not hold your breath.  Still keeping your neutral spine, slowly lower your leg all the way to the floor. Repeat this exercise with each leg __________ times. Complete this exercise __________ times per day. POSTURE AND BODY MECHANICS CONSIDERATIONS - Low Back Sprain Keeping correct posture when sitting, standing or completing your activities will reduce the stress put on different body tissues, allowing injured tissues a chance to heal and limiting painful experiences. The following are general guidelines for improved posture. Your physician or physical therapist will provide you with any instructions specific to your needs. While reading these guidelines, remember:  The exercises prescribed by your provider will help you have the flexibility and strength to maintain correct postures.  The correct posture provides the best environment for your joints to work. All of your joints have less wear and tear when properly supported by a spine with good  posture. This means you will experience a healthier, less painful body.  Correct posture must be practiced with all of your activities, especially prolonged sitting and standing. Correct posture is as important when doing repetitive low-stress activities (typing) as it is when doing a single heavy-load activity (lifting). RESTING POSITIONS Consider which positions are most painful for you when choosing a resting position. If you have pain with flexion-based activities (sitting, bending, stooping, squatting), choose a position that allows you to rest in a less flexed posture. You would want to avoid curling into a fetal position on your side. If your pain worsens with extension-based activities (prolonged standing, working overhead), avoid resting in an extended position such as sleeping on your stomach. Most people will find more comfort when they rest with their spine in a more neutral position, neither too rounded nor too arched. Lying on a non-sagging bed on your side with a pillow between your knees, or on your back with a pillow under your knees will often provide some relief. Keep in mind, being in any one position for a prolonged period of time, no matter how correct your posture, can still lead to stiffness. PROPER SITTING POSTURE In order to minimize stress and discomfort on your spine, you must sit with correct posture. Sitting with good posture should be effortless for a healthy body. Returning to good posture is a gradual process. Many people can work toward this most comfortably by using various supports until they have the flexibility and strength to maintain this posture on their own. When sitting with proper posture, your ears will fall over your shoulders and your shoulders will fall over your hips. You should use the back of the chair to support your upper back. Your lower back will be in a neutral position, just slightly arched. You may place a small pillow or folded towel at the base of  your lower back for  support.  When working at a desk, create an environment that supports good, upright posture. Without extra support, muscles tire, which leads to excessive strain on joints and other tissues. Keep these recommendations in mind: CHAIR:  A chair should be able to slide under your desk when your back makes contact with the back of the chair. This allows you to work closely.  The chair's height should allow your eyes to be level with the upper part of your monitor and your hands to be slightly lower than your elbows. BODY POSITION  Your feet should make contact with the floor. If this is not possible, use a foot rest.  Keep your ears over your shoulders. This will reduce stress on your neck and low back. INCORRECT SITTING POSTURES  If you are feeling tired and unable to assume a healthy sitting posture, do not slouch or slump. This puts excessive strain on your back tissues, causing more damage and pain. Healthier options include:  Using more support, like a lumbar pillow.  Switching tasks to something that requires you to be upright or walking.  Talking a brief walk.  Lying down to rest in a neutral-spine position. PROLONGED STANDING WHILE SLIGHTLY LEANING FORWARD  When completing a task that requires you to lean forward while standing in one place for a long time, place either foot up on a stationary 2-4 inch high object to help maintain the best posture. When both feet are on the ground, the lower back tends to lose its slight inward curve. If this curve flattens (or becomes too large), then the back and your other joints will experience too much stress, tire more quickly, and can cause pain. CORRECT STANDING POSTURES Proper standing posture should be assumed with all daily activities, even if they only take a few moments, like when brushing your teeth. As in sitting, your ears should fall over your shoulders and your shoulders should fall over your hips. You should keep  a slight tension in your abdominal muscles to brace your spine. Your tailbone should point down to the ground, not behind your body, resulting in an over-extended swayback posture.  INCORRECT STANDING POSTURES  Common incorrect standing postures include a forward head, locked knees and/or an excessive swayback. WALKING Walk with an upright posture. Your ears, shoulders and hips should all line-up. PROLONGED ACTIVITY IN A FLEXED POSITION When completing a task that requires you to bend forward at your waist or lean over a low surface, try to find a way to stabilize 3 out of 4 of your limbs. You can place a hand or elbow on your thigh or rest a knee on the surface you are reaching across. This will provide you more stability, so that your muscles do not tire as quickly. By keeping your knees relaxed, or slightly bent, you will also reduce stress across your lower back. CORRECT LIFTING TECHNIQUES DO :  Assume a wide stance. This will provide you more stability and the opportunity to get as close as possible to the object which you are lifting.  Tense your abdominals to brace your spine. Bend at the knees and hips. Keeping your back locked in a neutral-spine position, lift using your leg muscles. Lift with your legs, keeping your back straight.  Test the weight of unknown objects before attempting to lift them.  Try to keep your elbows locked down at your sides in order get the best strength from your shoulders when carrying an object.  Always ask for help when lifting heavy or awkward objects. INCORRECT LIFTING TECHNIQUES DO NOT:   Lock your knees when lifting, even if it is a small object.  Bend and twist. Pivot at your feet or move your feet when needing to change directions.  Assume that you can safely pick up even a paperclip without proper posture.   This information is not intended to replace advice  given to you by your health care provider. Make sure you discuss any questions you have  with your health care provider.   Document Released: 11/12/2005 Document Revised: 12/03/2014 Document Reviewed: 02/24/2009 [employer] Interactive Patient Education [employer]

## 2016-05-11 LAB — CULTURE, URINE COMPREHENSIVE
COLONY COUNT: NO GROWTH
Organism ID, Bacteria: NO GROWTH

## 2016-05-14 ENCOUNTER — Telehealth: Payer: Self-pay

## 2016-05-14 MED ORDER — TRAMADOL HCL 50 MG PO TABS: 50.0000 mg | ORAL_TABLET | Freq: Three times a day (TID) | ORAL | Status: DC | PRN

## 2016-05-14 NOTE — Telephone Encounter (Signed)
Printed and signed.  

## 2016-05-14 NOTE — Telephone Encounter (Signed)
Pt informed rx was sent to pharmacy.  

## 2016-05-14 NOTE — Telephone Encounter (Signed)
Pt dtr is requesting a refill of pt Tramadol.

## 2016-05-17 ENCOUNTER — Ambulatory Visit: Payer: Medicare HMO | Attending: Family | Admitting: Physical Therapy

## 2016-05-17 DIAGNOSIS — R262 Difficulty in walking, not elsewhere classified: Secondary | ICD-10-CM | POA: Diagnosis not present

## 2016-05-17 DIAGNOSIS — M5440 Lumbago with sciatica, unspecified side: Secondary | ICD-10-CM | POA: Insufficient documentation

## 2016-05-17 DIAGNOSIS — M6281 Muscle weakness (generalized): Secondary | ICD-10-CM | POA: Diagnosis not present

## 2016-05-17 NOTE — Patient Instructions (Signed)
Lower Trunk Rotation Stretch    Keeping back flat and feet together, rotate knees to left side. Hold _10-15___ seconds. Repeat __10__ times per set. Do _1-2___ sets per session. Do __2__ sessions per day.  http://orth.exer.us/123   Copyright  VHI. All rights reserved.    Knee to Chest (Flexion)    Pull knee toward chest. Feel stretch in lower back or buttock area. Breathing deeply, Hold __30__ seconds. Repeat with other knee. Repeat __3__ times. Do 2___ sessions per day.  http://gt2.exer.us/226   Copyright  VHI. All rights reserved.    PELVIC STABILIZATION: Pelvic Tilt (Lying)    Exhaling, pull belly toward spine, tilting pelvis forward. Inhaling, release. Repeat __10_ times. Do _2__ times per day.  Copyright  VHI. All rights reserved.   Sleeping on Back  Place pillow under knees. A pillow with cervical support and a roll around waist are also helpful. Copyright  VHI. All rights reserved.  Sleeping on Side Place pillow between knees. Use cervical support under neck and a roll around waist as needed. Copyright  VHI. All rights reserved.   Sleeping on Stomach   If this is the only desirable sleeping position, place pillow under lower legs, and under stomach or chest as needed.  Posture - Sitting   Sit upright, head facing forward. Try using a roll to support lower back. Keep shoulders relaxed, and avoid rounded back. Keep hips level with knees. Avoid crossing legs for long periods. Stand to Sit / Sit to Stand   To sit: Bend knees to lower self onto front edge of chair, then scoot back on seat. To stand: Reverse sequence by placing one foot forward, and scoot to front of seat. Use rocking motion to stand up.   Work Height and Reach  Ideal work height is no more than 2 to 4 inches below elbow level when standing, and at elbow level when sitting. Reaching should be limited to arm's length, with elbows slightly bent.  Bending  Bend at hips and knees, not  back. Keep feet shoulder-width apart.    Posture - Standing   Good posture is important. Avoid slouching and forward head thrust. Maintain curve in low back and align ears over shoul- ders, hips over ankles.  Alternating Positions   Alternate tasks and change positions frequently to reduce fatigue and muscle tension. Take rest breaks. Computer Work   Position work to Art gallery managerface forward. Use proper work and seat height. Keep shoulders back and down, wrists straight, and elbows at right angles. Use chair that provides full back support. Add footrest and lumbar roll as needed.  Getting Into / Out of Car  Lower self onto seat, scoot back, then bring in one leg at a time. Reverse sequence to get out.  Dressing  Lie on back to pull socks or slacks over feet, or sit and bend leg while keeping back straight.    Housework - Sink  Place one foot on ledge of cabinet under sink when standing at sink for prolonged periods.   Pushing / Pulling  Pushing is preferable to pulling. Keep back in proper alignment, and use leg muscles to do the work.  Deep Squat   Squat and lift with both arms held against upper trunk. Tighten stomach muscles without holding breath. Use smooth movements to avoid jerking.  Avoid Twisting   Avoid twisting or bending back. Pivot around using foot movements, and bend at knees if needed when reaching for articles.  Carrying Luggage   Distribute weight  evenly on both sides. Use a cart whenever possible. Do not twist trunk. Move body as a unit.   Lifting Principles .Maintain proper posture and head alignment. .Slide object as close as possible before lifting. .Move obstacles out of the way. .Test before lifting; ask for help if too heavy. .Tighten stomach muscles without holding breath. .Use smooth movements; do not jerk. .Use legs to do the work, and pivot with feet. .Distribute the work load symmetrically and close to the center of trunk. .Push instead of  pull whenever possible.   Ask For Help   Ask for help and delegate to others when possible. Coordinate your movements when lifting together, and maintain the low back curve.  Log Roll   Lying on back, bend left knee and place left arm across chest. Roll all in one movement to the right. Reverse to roll to the left. Always move as one unit. Housework - Sweeping  Use long-handled equipment to avoid stooping.   Housework - Wiping  Position yourself as close as possible to reach work surface. Avoid straining your back.  Laundry - Unloading Wash   To unload small items at bottom of washer, lift leg opposite to arm being used to reach.  Gardening - Raking  Move close to area to be raked. Use arm movements to do the work. Keep back straight and avoid twisting.       Cart  When reaching into cart with one arm, lift opposite leg to keep back straight.   Getting Into / Out of Bed  Lower self to lie down on one side by raising legs and lowering head at the same time. Use arms to assist moving without twisting. Bend both knees to roll onto back if desired. To sit up, start from lying on side, and use same move-ments in reverse. Housework - Vacuuming  Hold the vacuum with arm held at side. Step back and forth to move it, keeping head up. Avoid twisting.   Laundry - Armed forces training and education officerLoading Wash  Position laundry basket so that bending and twisting can be avoided.   Laundry - Unloading Dryer  Squat down to reach into clothes dryer or use a reacher.  Gardening - Weeding / Psychiatric nurselanting  Squat or Kneel. Knee pads may be helpful.

## 2016-05-17 NOTE — Therapy (Addendum)
[place], [place], [address] Phone: [phone]   Fax:  [phone]  Physical Therapy Evaluation/Discharge Patient Details  Name: Alyssa Lindsey MRN: [id number] Date of Birth: [birth year] Referring Provider: Dr. Vidal Schwalbe, Dr. Lesly Rubenstein  Encounter Date: 05/17/2016      PT End of Session - 05/17/16 1959    Visit Number 1   Number of Visits 16   Date for PT Re-Evaluation 07/12/16   PT Start Time 0845   PT Stop Time 0945   PT Time Calculation (min) 60 min   Activity Tolerance Patient tolerated treatment well   Behavior During Therapy [facility] for tasks assessed/performed      Past Medical History  Diagnosis Date  . Type II or unspecified type diabetes mellitus without mention of complication, not stated as uncontrolled   . HTN (hypertension)   . LBP (low back pain)   . Chronic neck pain   . Spondylosis   . Arthritis   . Diverticulosis of colon (without mention of hemorrhage)   . Hiatal hernia   . Gastritis   . Family history of malignant neoplasm of gastrointestinal tract   . Memory problem     Past Surgical History  Procedure Laterality Date  . Tubal ligation    . Colonoscopy  2010    NORMAL-- DUE 2020    There were no vitals filed for this visit.       Subjective Assessment - 05/17/16 0933    Subjective Pt reports 3 weeks ago back pain began suddenly. Pain in chronic and intermittent over the past 2 yrs, she came to PT and reports her pain improved.   She continues to have difficulty walking, bending, squatting, and performing normal housework.  She reports no new LE weakness or sensory symptoms, denies red flags.  Pain seems to be worse when standing but also gts uncomfortable in sitting as well.  Pain does radiate to both legs but rarely goes below knees.    Patient is accompained by: Family member  daughter   Pertinent History diabetic neuropathy , known spondylosis, renal issues, DM,  cardiac, LE edema, SIJ pain    Limitations Lifting;Standing;Walking;House hold activities;Sitting   How long can you sit comfortably? not comfortable    How long can you stand comfortably? 5-10 min    How long can you walk comfortably? 5-10 min    Diagnostic tests none recent    Patient Stated Goals "I just want to get better"   Currently in Pain? Yes   Pain Score 7    Pain Location Back   Pain Orientation Right;Left;Lower   Pain Descriptors / Indicators Tightness;Sore   Pain Type Acute pain   Pain Radiating Towards bilateral LEs equal, stops at knees but ankle swell   Pain Onset 1 to 4 weeks ago   Pain Frequency Constant   Aggravating Factors  prolonged positioning   Pain Relieving Factors heat, meds help a little bit , lying flat    Effect of Pain on Daily Activities cannot do normal activities or be as active as she would like to be            [facility] PT Assessment - 05/17/16 0941    Assessment   Medical Diagnosis bilateral low back pain    Referring Provider Dr. Vidal Schwalbe, Dr. Lesly Rubenstein   Onset Date/Surgical Date --  3 weeks    Prior Therapy yes   Precautions   Precautions None   Restrictions  Weight Bearing Restrictions No   Balance Screen   Has the patient fallen in the past 6 months No   Has the patient had a decrease in activity level because of a fear of falling?  Yes  pain    Is the patient reluctant to leave their home because of a fear of falling?  No   Home Environment   Living Environment Private residence   Living Arrangements Children   Type of [place] Access Level entry   [place] One level   Prior Function   Level of Independence Independent;Independent with community mobility without device;Independent with homemaking with ambulation   Cognition   Overall Cognitive Status Within Functional Limits for tasks assessed   Observation/Other Assessments   Focus on Therapeutic Outcomes (FOTO)  46%   Observation/Other Assessments-Edema     Edema --  bilateral LEs not measured, followed byMD   Sensation   Light Touch Impaired by gross assessment   Additional Comments per patient report   Coordination   Gross Motor Movements are Fluid and Coordinated Not tested   Functional Tests   Functional tests --   Posture/Postural Control   Posture/Postural Control Postural limitations   AROM   Lumbar Flexion 75  Pt reporting increased pain with flexion   Lumbar Extension 22  Pt reporting pulling in center of low back   Lumbar - Right Side Bend 15  Pt reporting pain on L side   Lumbar - Left Side Bend 8  Pt reporting pain in center of low back   Lumbar - Right Rotation 75% limited   Lumbar - Left Rotation 95% limited  Pt reporting worsening pain when rotating to left   PROM   PROM Assessment Site Hip   Right/Left Hip Right;Left   Right Hip Internal Rotation  --  pt reporting pain in groin    Left Hip Flexion Pain with flexion greater than 45 degrees in supine   Strength   Right/Left Hip Right;Left   Right Hip Flexion 3/5   Right Hip ADduction 3-/5   Left Hip Flexion 3+/5   Left Hip ABduction 3-/5   Right/Left Knee Right;Left   Right Knee Flexion 5/5   Right Knee Extension 5/5   Left Knee Flexion 5/5   Left Knee Extension 5/5   Right/Left Ankle Right;Left   Right Ankle Dorsiflexion 5/5   Left Ankle Dorsiflexion 5/5   Palpation   Palpation comment Tenderness noted mid thoracic and lumbar area. Pt also with soreness noted over R gluteal region.Pt also with soreness over R iliac crest.    Special Tests    Special Tests --  neg SLR   Bed Mobility   Bed Mobility Rolling Right;Rolling Left;Left Sidelying to Sit;Sit to Supine   Rolling Right 4: Min guard   Rolling Left 5: Supervision   Rolling Left Details (indicate cue type and reason) narrow table, cues for back    Left Sidelying to Sit 5: Supervision   Left Sidelying to Sit Details (indicate cue type and reason) technique for back safety   Sit to Supine 6: Modified  independent (Device/Increase time)                           PT Education - 05/17/16 1958    Education provided Yes   Education Details PT findings, POC, HEP, posture and body mechanics   Person(s) Educated Patient   Methods Explanation;Demonstration;Handout   Comprehension Verbalized  understanding;Returned demonstration;Need further instruction;Verbal cues required;Tactile cues required          PT Short Term Goals - 05/17/16 2009    PT SHORT TERM GOAL #1   Title Pt will need only verbal cueing for with bed mobility, transfers   Time 4   Period Weeks   Status New   PT SHORT TERM GOAL #2   Title Pt will be I with HEP   Time 4   Period Weeks   Status New   PT SHORT TERM GOAL #3   Title Patient will use assistive device out of the home for pain relief and reduced fall risk.    Time 4   Period Weeks   Status New   PT SHORT TERM GOAL #4   Title Pt will report less pain in LEs overall with standing (25% less)   Status New           PT Long Term Goals - 05/17/16 2010    PT LONG TERM GOAL #1   Title Pt will be I with more advanced HEP for posture, strength and flexibility   Time 8   Period Weeks   Status New   PT LONG TERM GOAL #2   Title Pt will be able to transfer with no pain and good body mechanics, no cues (supine to sit, rolling)   Time 8   Period Weeks   Status New   PT LONG TERM GOAL #3   Title Pt will be able to stand, walk in the home with min pain increase to allow light housework, ADLs   Time 8   Period Weeks   Status New   PT LONG TERM GOAL #4   Title FOTO will improve to 35% limited or less to demo improved mobility   Time 8   Period Weeks   Status New   PT LONG TERM GOAL #5   Title Pt will report being able to return to going out with family, church for full life participation with min pain increase overall   Time 8   Period Weeks   Status New               Plan - 05/17/16 1959    Clinical Impression Statement  Patient with mod complexity evaluation for low back pain which is acute on chronic.  Most recent onset has had a significant impact on her mobility.  She has good LE strength but poor hip and core strength, stiffness throughout her spine and difficutty maintaining a position for any amount of time.  She has had a good result from PT prior, so she will likely respond well to corretive exercise and education regarding posture, body mechanics and her condition.    Rehab Potential Excellent   PT Frequency 2x / week   PT Duration 8 weeks   PT Treatment/Interventions ADLs/Self Care Home Management;Ultrasound;Neuromuscular re-education;Patient/family education;Passive range of motion;Cryotherapy;Electrical Stimulation;Functional mobility training;Therapeutic activities;Manual techniques;Therapeutic exercise;Moist Heat;Gait training   PT Next Visit Plan check HEP , review posture wtih ADLs, modalties and Nustep, TUG   PT Home Exercise Plan lower trunk, single knee to chest, pelvic tilt and posture handout   Consulted and Agree with Plan of Care Patient      Patient will benefit from skilled therapeutic intervention in order to improve the following deficits and impairments:  Decreased activity tolerance, Decreased range of motion, Difficulty walking, Increased fascial restricitons, Pain, Decreased balance, Impaired flexibility, Improper body mechanics, Postural dysfunction, Increased edema, Decreased  strength, Decreased mobility  Visit Diagnosis: Difficulty in walking, not elsewhere classified  Low back pain with sciatica, sciatica laterality unspecified, unspecified back pain laterality  Muscle weakness (generalized)      G-Codes - June 01, 2016 01-19-12    Functional Assessment Tool Used FOTO   Functional Limitation Mobility: Walking and moving around   Mobility: Walking and Moving Around Current Status [phone]) At least 40 percent but less than 60 percent impaired, limited or restricted   Mobility:  Walking and Moving Around Goal Status [phone]) At least 20 percent but less than 40 percent impaired, limited or restricted       Problem List Patient Active Problem List   Diagnosis Date Noted  . Low back pain 05/09/2016  . Numbness and tingling of both legs 04/25/2016  . Bilateral leg edema 04/11/2016  . Edema 04/06/2016  . Acute URI 04/06/2016  . Routine general medical examination at a health care facility 12/26/2015  . GERD (gastroesophageal reflux disease) 03/02/2015  . Paroxysmal a-fib ([place]) 03/04/2014  . Chronic LLQ pain 02/07/2012  . VITAMIN B12 DEFICIENCY 03/06/2010  . 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 NECK PAIN, CHRONIC 03/22/2008  . THYROID NODULE 03/03/2008  . BREAST MASS 03/03/2008  . Diabetes type 2, controlled ([place]) 06/21/2007  . Essential hypertension 06/21/2007    Cyrstal Leitz 06-01-16, 8:15 PM  [facility] [address] [place], [place], [address] Phone: [phone]   Fax:  [phone]  Name: Alyssa Lindsey MRN: [id number] Date of Birth: 07/26/1929   Raeford Razor, PT June 01, 2016 8:15 PM Phone: [phone] Fax: [phone]   PHYSICAL THERAPY DISCHARGE SUMMARY  Visits from Start of Care: 1 Current functional level related to goals / functional outcomes: As previous- did not attend PT    Remaining deficits: As previous   Education / Equipment: PT/POC  Plan: Patient agrees to discharge.  Patient goals were not met. Patient is being discharged due to the patient's request.  ?????   Cannot afford PT.    Raeford Razor, PT 05/30/2016 1:00 PM Phone: [phone] Fax: [phone]

## 2016-05-25 ENCOUNTER — Ambulatory Visit: Payer: Medicare HMO | Admitting: Physical Therapy

## 2016-05-30 ENCOUNTER — Telehealth: Payer: Self-pay | Admitting: Cardiology

## 2016-05-30 MED ORDER — RIVAROXABAN 20 MG PO TABS: 20.0000 mg | ORAL_TABLET | Freq: Every day | ORAL | Status: DC

## 2016-05-30 NOTE — Telephone Encounter (Signed)
NOTIFIED PATIENT NO SAMPLES AVAILABLE PATIENT STATES SHE DOES NOT HAVE ANY MEDICATION FOR TODAY. OFFERED TO CALL PRESCRIPTION INTO PHARMACY  PATIENT ASK THE PRICE   RH INFORMED HER - NOT SURE - LEFT A FREE 30 DAY CALL FOR USE.  PATIENT IS NOT SURE IF SHE USE IT IN THE PAST OR NOT.   E-SENT PRESCRIPTION TO PHARMACY INFORMED PATIENT TO CALL BACK IF SHE CAN NOT AFFORD MEDICATION - TO SEE IF ANY OTHER OPTIONS WITH [facility] PHARMACIST PATIENT VOICED UNDERSTANDING.

## 2016-05-30 NOTE — Telephone Encounter (Signed)
New message    Patient calling the office for samples of medication:   1.  What medication and dosage are you requesting samples for? Xarelto  2.  Are you currently out of this medication? yes   

## 2016-05-31 ENCOUNTER — Ambulatory Visit: Payer: Medicare HMO | Admitting: Physical Therapy

## 2016-06-07 ENCOUNTER — Encounter: Payer: Medicare HMO | Admitting: Physical Therapy

## 2016-06-12 ENCOUNTER — Ambulatory Visit: Payer: Medicare HMO | Admitting: Family Medicine

## 2016-06-14 ENCOUNTER — Encounter: Payer: Medicare HMO | Admitting: Physical Therapy

## 2016-06-22 ENCOUNTER — Encounter: Payer: Medicare HMO | Admitting: Physical Therapy

## 2016-06-28 ENCOUNTER — Encounter: Payer: Medicare HMO | Admitting: Physical Therapy

## 2016-07-02 NOTE — Progress Notes (Signed)
HPI The patient presents for evaluation of atrial fibrillation.    At the last visit she was very sad because her son had died from complications of pneumonia.  Since then she has had trouble affording her Noel Christmas.  She returns for follow up.  Her daughter took me aside today to talk about increasing dementia.  The patient is tearful in the room today.  However, she denies any cardiovascular complaints.  The patient denies any new symptoms such as chest discomfort, neck or arm discomfort. There has been no new shortness of breath, PND or orthopnea. There have been no reported palpitations, presyncope or syncope.  Allergies  Allergen Reactions  . Dicyclomine Hcl Other (See Comments)    Severe eye pain  . Lisinopril Cough  . Aspirin Other (See Comments)    Unknown reaction    Current Outpatient Prescriptions  Medication Sig Dispense Refill  . amLODipine (NORVASC) 10 MG tablet Take 1 tablet (10 mg total) by mouth daily. 30 tablet 9  . cetirizine (ZYRTEC) 10 MG tablet Take 1 tablet (10 mg total) by mouth daily. 30 tablet 11  . cyclobenzaprine (FLEXERIL) 10 MG tablet TAKE ONE-HALF TO ONE TABLET BY MOUTH THREE TIMES DAILY AS NEEDED FOR MUSCLE SPASMS**WATCH OUT FOR SEDATION AND CAREFUL OF FALLS** 30 tablet 0  . famotidine (PEPCID) 20 MG tablet TAKE ONE TABLET BY MOUTH TWICE DAILY 60 tablet 2  . losartan-hydrochlorothiazide (HYZAAR) 50-12.5 MG tablet Take 1 tablet by mouth daily. 30 tablet 3  . metoprolol tartrate (LOPRESSOR) 25 MG tablet TAKE ONE-HALF TABLET BY MOUTH TWICE DAILY 60 tablet 4  . rivaroxaban (XARELTO) 20 MG TABS tablet Take 1 tablet (20 mg total) by mouth daily with supper. 30 tablet 6  . traMADol (ULTRAM) 50 MG tablet Take 1 tablet (50 mg total) by mouth every 8 (eight) hours as needed. 60 tablet 0  . VOLTAREN 1 % GEL APPLY TWO GRAMS THREE TIMES DAILY AS NEEDED 100 g 3   No current facility-administered medications for this visit.     Past Medical History:  Diagnosis Date  .  Arthritis   . Chronic neck pain   . Diverticulosis of colon (without mention of hemorrhage)   . Family history of malignant neoplasm of gastrointestinal tract   . Gastritis   . Hiatal hernia   . HTN (hypertension)   . LBP (low back pain)   . Memory problem   . Spondylosis   . Type II or unspecified type diabetes mellitus without mention of complication, not stated as uncontrolled     Past Surgical History:  Procedure Laterality Date  . COLONOSCOPY  2010   NORMAL-- DUE 2020  . TUBAL LIGATION      ROS:  Positive for none.  Otherwise as stated in the HPI and negative for all other systems.    PHYSICAL EXAM BP 136/74   Pulse 75   Ht  (1.651 m)   Wt 156 lb (70.8 kg)   BMI 25.96 kg/m  GENERAL:  Well appearing NECK:  No jugular venous distention, waveform within normal limits, carotid upstroke brisk and symmetric, no bruits, no thyromegaly LUNGS:  Clear to auscultation bilaterally HEART:  PMI not displaced or sustained,S1 and S2 within normal limits, no S3, no S4 no clicks, no rubs, no murmurs ABD:  Flat, positive bowel sounds normal in frequency in pitch, no bruits, no rebound, no guarding, no midline pulsatile mass, no hepatomegaly, no splenomegaly EXT:  2 plus pulses throughout, no edema, no cyanosis  no clubbing SKIN:  No rashes no nodules PSYCH:  Sad  EKG:  NSR, rate 75, axis WNL intervals WNL, no acute ST T wave changes. PVCs.   07/04/2016   ASSESSMENT AND PLAN  ATRIAL FIB:  Ms. Tora PerchesDorothy B Lamaster has a CHA2DS2 - VASc score of 5 with a risk of stroke of 6.7%  and a HAS - BLED score of 2 with a low risk of bleeding. She had a Holter with PAF.  She does not really feel this.  She has been unable to afford her Xarelto.  I will have her talk to the Pharm D.  She might need to be switched to Warfarin.  I will send a message to Dr. Okey Duprerawford to see if she will draw a CBC tomorrow.    HTN:  Her BP is not controlled and she will continue meds as listed.  DEMENTIA:    She is to  talk to Myrlene BrokerElizabeth A Crawford, MD about this.

## 2016-07-04 ENCOUNTER — Telehealth: Payer: Self-pay | Admitting: Cardiology

## 2016-07-04 ENCOUNTER — Ambulatory Visit (INDEPENDENT_AMBULATORY_CARE_PROVIDER_SITE_OTHER): Payer: Medicare HMO | Admitting: Cardiology

## 2016-07-04 ENCOUNTER — Encounter: Payer: Self-pay | Admitting: Cardiology

## 2016-07-04 VITALS — BP 136/74 | HR 75 | Ht 65.0 in | Wt 156.0 lb

## 2016-07-04 DIAGNOSIS — I480 Paroxysmal atrial fibrillation: Secondary | ICD-10-CM

## 2016-07-04 DIAGNOSIS — I10 Essential (primary) hypertension: Secondary | ICD-10-CM | POA: Diagnosis not present

## 2016-07-04 NOTE — Telephone Encounter (Signed)
New message     Pts dtr wants to get clarification on how her mother is to take her medicine. Please call.

## 2016-07-04 NOTE — Telephone Encounter (Signed)
Returned call to daughter (ok per DPR).  Daughter reports that they called the insurance company and will be able to continue the xarelto instead of changing to coumadin as mentioned today at OV.  Noted that patient was going to speak to pharmacist.   Posey ReaUnsure if spoke to pharmacist but will route to make them aware.

## 2016-07-04 NOTE — Patient Instructions (Signed)
Medication Instructions:  Continue current medications  Labwork: NONE  Testing/Procedures: NONE  Follow-Up: Your physician wants you to follow-up in: 1 Year. You will receive a reminder letter in the mail two months in advance. If you don't receive a letter, please call our office to schedule the follow-up appointment.   Any Other Special Instructions Will Be Listed Below (If Applicable).   If you need a refill on your cardiac medications before your next appointment, please call your pharmacy.   

## 2016-07-05 ENCOUNTER — Ambulatory Visit (INDEPENDENT_AMBULATORY_CARE_PROVIDER_SITE_OTHER): Payer: Medicare HMO | Admitting: Internal Medicine

## 2016-07-05 ENCOUNTER — Encounter: Payer: Self-pay | Admitting: Internal Medicine

## 2016-07-05 ENCOUNTER — Other Ambulatory Visit (INDEPENDENT_AMBULATORY_CARE_PROVIDER_SITE_OTHER): Payer: Medicare HMO

## 2016-07-05 VITALS — BP 106/64 | HR 64 | Temp 98.6°F | Resp 16 | Ht 65.0 in | Wt 156.0 lb

## 2016-07-05 DIAGNOSIS — F4323 Adjustment disorder with mixed anxiety and depressed mood: Secondary | ICD-10-CM | POA: Diagnosis not present

## 2016-07-05 DIAGNOSIS — D519 Vitamin B12 deficiency anemia, unspecified: Secondary | ICD-10-CM

## 2016-07-05 DIAGNOSIS — E538 Deficiency of other specified B group vitamins: Secondary | ICD-10-CM | POA: Diagnosis not present

## 2016-07-05 DIAGNOSIS — R69 Illness, unspecified: Secondary | ICD-10-CM | POA: Diagnosis not present

## 2016-07-05 LAB — VITAMIN B12: Vitamin B-12: 357 pg/mL (ref 211–911)

## 2016-07-05 LAB — COMPREHENSIVE METABOLIC PANEL
ALBUMIN: 4.2 g/dL (ref 3.5–5.2)
ALT: 9 U/L (ref 0–35)
AST: 16 U/L (ref 0–37)
Alkaline Phosphatase: 68 U/L (ref 39–117)
BUN: 23 mg/dL (ref 6–23)
CHLORIDE: 106 meq/L (ref 96–112)
CO2: 27 meq/L (ref 19–32)
Calcium: 9.9 mg/dL (ref 8.4–10.5)
Creatinine, Ser: 1.73 mg/dL — ABNORMAL HIGH (ref 0.40–1.20)
GFR: 35.98 mL/min — AB (ref >60.00)
GLUCOSE: 147 mg/dL — AB (ref 70–99)
POTASSIUM: 3.4 meq/L — AB (ref 3.5–5.1)
SODIUM: 141 meq/L (ref 135–145)
TOTAL PROTEIN: 7.6 g/dL (ref 6.0–8.3)
Total Bilirubin: 0.8 mg/dL (ref 0.2–1.2)

## 2016-07-05 LAB — CBC
HCT: 39 % (ref 36.0–46.0)
Hemoglobin: 12.8 g/dL (ref 12.0–15.0)
MCHC: 32.8 g/dL (ref 30.0–36.0)
MCV: 81.2 fl (ref 78.0–100.0)
PLATELETS: 200 10*3/uL (ref 150.0–400.0)
RBC: 4.81 Mil/uL (ref 3.87–5.11)
RDW: 15.8 % — AB (ref 11.5–15.5)
WBC: 4.8 10*3/uL (ref 4.0–10.5)

## 2016-07-05 MED ORDER — MIRTAZAPINE 15 MG PO TABS: 15.0000 mg | ORAL_TABLET | Freq: Every day | ORAL | 5 refills | Status: DC

## 2016-07-05 NOTE — Progress Notes (Signed)
   Subjective:    Patient ID: Alyssa Lindsey, female    DOB: 1929-10-13, 82 y.o.   MRN: [id number]  HPI The patient is an 82 YO female coming in with her 2 daughters who help to provide history with her. She is having a lot of worry and depression lately. She is crying a lot since the loss of her husband and her son. This is weighing on her and she is not sleeping well. Still eating okay. Does not want to leave the house although she previously liked to go do things. Denies SI/HI.   Review of Systems  Constitutional: Negative for activity change, appetite change, chills, fatigue and fever.  HENT: Negative.   Eyes: Negative.   Respiratory: Negative for cough, chest tightness, shortness of breath and wheezing.   Cardiovascular: Negative for chest pain, palpitations and leg swelling.  Gastrointestinal: Negative for abdominal distention, abdominal pain, blood in stool, diarrhea, nausea and vomiting.  Musculoskeletal: Positive for arthralgias. Negative for back pain, gait problem and myalgias.  Skin: Negative.   Neurological: Negative.   Psychiatric/Behavioral: Positive for decreased concentration, dysphoric mood and sleep disturbance. Negative for self-injury and suicidal ideas.      Objective:   Physical Exam  Constitutional: She is oriented to person, place, and time. She appears well-developed and well-nourished.  HENT:  Head: Normocephalic and atraumatic.  Eyes: EOM are normal. Pupils are equal, round, and reactive to light.  Neck: Normal range of motion. Neck supple. No JVD present. No tracheal deviation present. No thyromegaly present.  Cardiovascular: Normal rate and regular rhythm.   Pulmonary/Chest: Effort normal and breath sounds normal. No stridor. No respiratory distress. She has no wheezes. She has no rales.  Abdominal: Soft. Bowel sounds are normal. She exhibits no distension. There is no tenderness. There is no rebound.  Musculoskeletal: She exhibits no edema.    Lymphadenopathy:    She has no cervical adenopathy.  Neurological: She is alert and oriented to person, place, and time.  Skin: Skin is warm and dry.  Psychiatric:  Some tearfulness when discussing her mood.    Vitals:   07/05/16 0950  BP: 106/64  Pulse: 64  Resp: 16  Temp: 98.6 F (37 C)  TempSrc: Oral  SpO2: 96%  Weight: 156 lb (70.8 kg)  Height: 5\' 5"  (1.651 m)      Assessment & Plan:

## 2016-07-05 NOTE — Progress Notes (Signed)
Pre visit review using our clinic review tool, if applicable. No additional management support is needed unless otherwise documented below in the visit note. 

## 2016-07-05 NOTE — Patient Instructions (Addendum)
We have sent in remeron which is the sleeping medicine which should help with some of the sadness as well. Take 1 pill about 30 minutes before bedtime to help. It can take 2 weeks or so for it to work well.   Call us in about 2-3 weeks to let us know how it is going.   You can use use miralax to help with the constipation right now.   I will recommend to start taking senokot-d daily to help keep you from being constipated. It is a medicine that helps to move the bowels and keep everything soft.

## 2016-07-06 DIAGNOSIS — F4323 Adjustment disorder with mixed anxiety and depressed mood: Secondary | ICD-10-CM | POA: Insufficient documentation

## 2016-07-06 NOTE — Assessment & Plan Note (Signed)
Checking labs, rx for remeron to try at bedtime for the mood and the sleep.

## 2016-07-11 ENCOUNTER — Other Ambulatory Visit: Payer: Self-pay | Admitting: Internal Medicine

## 2016-07-12 NOTE — Telephone Encounter (Signed)
Faxed to pharmacy

## 2016-07-13 ENCOUNTER — Telehealth: Payer: Self-pay | Admitting: *Deleted

## 2016-07-13 NOTE — Telephone Encounter (Signed)
The 36 hour day is a good book about memory problems that can help talk them through some strategies for keeping her more calm and getting a routine in place.

## 2016-07-13 NOTE — Telephone Encounter (Signed)
Notified pt/daughter w/MD response../lmb 

## 2016-07-13 NOTE — Telephone Encounter (Signed)
Rec'd call pt daughter states mom is sleeping better at night, but she is more irritated during the day. Fussing a lot for no reason. Wanting to ask MD is that's how dementia does, and is there anything they can do to keep her more calm/relax during the day...Raechel Chute/lmb

## 2016-09-04 ENCOUNTER — Other Ambulatory Visit: Payer: Self-pay | Admitting: Cardiology

## 2016-09-19 ENCOUNTER — Other Ambulatory Visit: Payer: Self-pay | Admitting: Internal Medicine

## 2016-10-22 ENCOUNTER — Telehealth: Payer: Self-pay | Admitting: Emergency Medicine

## 2016-10-22 NOTE — Telephone Encounter (Signed)
Pts daughter called and wants to know if you can give her a call back about her mothers dementia. Please advise thanks.

## 2016-10-23 NOTE — Telephone Encounter (Signed)
Tried to reach patient's daughter, no answer. 

## 2016-10-24 ENCOUNTER — Other Ambulatory Visit: Payer: Self-pay | Admitting: Cardiology

## 2016-10-24 ENCOUNTER — Other Ambulatory Visit: Payer: Self-pay | Admitting: Internal Medicine

## 2016-10-24 NOTE — Telephone Encounter (Signed)
REFILL 

## 2016-11-20 ENCOUNTER — Encounter: Payer: Self-pay | Admitting: Internal Medicine

## 2016-11-20 ENCOUNTER — Ambulatory Visit (INDEPENDENT_AMBULATORY_CARE_PROVIDER_SITE_OTHER): Payer: Medicare HMO | Admitting: Internal Medicine

## 2016-11-20 VITALS — BP 142/78 | HR 56 | Temp 97.9°F | Resp 16 | Ht 65.0 in | Wt 154.8 lb

## 2016-11-20 DIAGNOSIS — M5441 Lumbago with sciatica, right side: Secondary | ICD-10-CM

## 2016-11-20 DIAGNOSIS — Z23 Encounter for immunization: Secondary | ICD-10-CM | POA: Diagnosis not present

## 2016-11-20 DIAGNOSIS — R69 Illness, unspecified: Secondary | ICD-10-CM | POA: Diagnosis not present

## 2016-11-20 DIAGNOSIS — F4323 Adjustment disorder with mixed anxiety and depressed mood: Secondary | ICD-10-CM

## 2016-11-20 MED ORDER — MIRTAZAPINE 30 MG PO TABS: 30.0000 mg | ORAL_TABLET | Freq: Every day | ORAL | 6 refills | Status: DC

## 2016-11-20 MED ORDER — TRAMADOL HCL 50 MG PO TABS: 50.0000 mg | ORAL_TABLET | Freq: Three times a day (TID) | ORAL | 2 refills | Status: DC | PRN

## 2016-11-20 MED ORDER — PREDNISONE 20 MG PO TABS: 40.0000 mg | ORAL_TABLET | Freq: Every day | ORAL | 0 refills | Status: DC

## 2016-11-20 NOTE — Patient Instructions (Signed)
We have sent in the prednisone to help the hips. Take 2 pills a day for 5 days. We have also sent in a refill of the tramadol which you can use for pain as needed.   We have sent in the stronger medicine to help with sleep as well. This is the remeron.   It is a good idea to try some counseling at the hospice center to see if being around others could be helpful.   We have given you the flu shot today.

## 2016-11-20 NOTE — Progress Notes (Signed)
   Subjective:    Patient ID: Alyssa Lindsey, female    DOB: 1929/08/16, 82 y.o.   MRN: [id number]  HPI The patient is an 82 YO female coming in for pain in her hips. Going on for about 2 weeks or so. No injury or overuse at onset. She has used tylenol for the pain which is not that effective. She has prescription for tramadol but has been out so hasn't used this for the pain. She previously has had injections in the hips and wants that today.  She is also having more problems with her depression. The remeron did not help much. She is still having problems sleeping and with crying a lot. Her daughter is with her and states that she does not want to leave the house. She denies SI/HI. No side effects from the remeron.   Review of Systems  Constitutional: Positive for activity change. Negative for appetite change, chills, fatigue, fever and unexpected weight change.  Respiratory: Negative.   Cardiovascular: Negative.   Musculoskeletal: Positive for arthralgias, back pain and myalgias. Negative for gait problem, joint swelling, neck pain and neck stiffness.  Skin: Negative.   Neurological: Negative.   Psychiatric/Behavioral: Positive for decreased concentration, dysphoric mood and sleep disturbance. Negative for agitation, behavioral problems, hallucinations, self-injury and suicidal ideas. The patient is nervous/anxious. The patient is not hyperactive.       Objective:   Physical Exam  Constitutional: She is oriented to person, place, and time. She appears well-developed and well-nourished.  HENT:  Head: Normocephalic and atraumatic.  Eyes: EOM are normal.  Cardiovascular: Normal rate and regular rhythm.   Pulmonary/Chest: Effort normal and breath sounds normal. No respiratory distress. She has no wheezes. She has no rales.  Abdominal: Soft. She exhibits no distension. There is no tenderness. There is no rebound.  Musculoskeletal: She exhibits tenderness.  Pain in the lower lumbar and  sacral midline as well as bilaterally. No pain in the groin bilaterally. No radiation of the pain.   Neurological: She is alert and oriented to person, place, and time. Coordination normal.  Skin: Skin is warm and dry.   Vitals:   11/20/16 1111  BP: (!) 142/78  Pulse: (!) 56  Resp: 16  Temp: 97.9 F (36.6 C)  TempSrc: Oral  SpO2: 96%  Weight: 154 lb 12 oz (70.2 kg)  Height: 5\' 5"  (1.651 m)      Assessment & Plan:  Flu shot given at visit.

## 2016-11-20 NOTE — Assessment & Plan Note (Signed)
Mostly buttock and lumbar pain today on exam, rx for prednisone burst to see if this helps. Rx refill on her tramadol as well since she is unable to take nsaids. Using tylenol which is not effective. No red flag signs to warrant imaging today.

## 2016-11-20 NOTE — Progress Notes (Signed)
Pre visit review using our clinic review tool, if applicable. No additional management support is needed unless otherwise documented below in the visit note. 

## 2016-11-20 NOTE — Assessment & Plan Note (Signed)
Not well controlled and will increase the remeron to 30 mg nightly to see if this can better help her symptoms. She will also think about trying some counseling which she has not done. Many family member deaths in the last several years.

## 2016-12-19 DIAGNOSIS — Z7901 Long term (current) use of anticoagulants: Secondary | ICD-10-CM | POA: Diagnosis not present

## 2016-12-19 DIAGNOSIS — G4700 Insomnia, unspecified: Secondary | ICD-10-CM | POA: Diagnosis not present

## 2016-12-19 DIAGNOSIS — Z0000 Encounter for general adult medical examination without abnormal findings: Secondary | ICD-10-CM | POA: Diagnosis not present

## 2016-12-19 DIAGNOSIS — Z79891 Long term (current) use of opiate analgesic: Secondary | ICD-10-CM | POA: Diagnosis not present

## 2016-12-19 DIAGNOSIS — M545 Low back pain: Secondary | ICD-10-CM | POA: Diagnosis not present

## 2016-12-19 DIAGNOSIS — I10 Essential (primary) hypertension: Secondary | ICD-10-CM | POA: Diagnosis not present

## 2016-12-19 DIAGNOSIS — Z6826 Body mass index (BMI) 26.0-26.9, adult: Secondary | ICD-10-CM | POA: Diagnosis not present

## 2016-12-19 DIAGNOSIS — K219 Gastro-esophageal reflux disease without esophagitis: Secondary | ICD-10-CM | POA: Diagnosis not present

## 2017-01-10 ENCOUNTER — Telehealth: Payer: Self-pay | Admitting: Emergency Medicine

## 2017-01-10 NOTE — Telephone Encounter (Signed)
She will need visit to discuss.

## 2017-01-10 NOTE — Telephone Encounter (Signed)
Pt called and wants to know if she can get a referral to a specialist. She is still having stomach problems. Please advise thanks.

## 2017-01-10 NOTE — Telephone Encounter (Signed)
Patient contacted and informed that she needs visit to discuss, patient stated awareness

## 2017-01-10 NOTE — Telephone Encounter (Signed)
Contacted patient and stated that she would probably need to be seen first. Pleas advise

## 2017-01-14 ENCOUNTER — Ambulatory Visit: Payer: Medicare HMO | Admitting: Internal Medicine

## 2017-01-21 ENCOUNTER — Other Ambulatory Visit: Payer: Self-pay | Admitting: Internal Medicine

## 2017-01-22 ENCOUNTER — Telehealth: Payer: Self-pay

## 2017-01-22 MED ORDER — LOSARTAN POTASSIUM-HCTZ 50-12.5 MG PO TABS: 1.0000 | ORAL_TABLET | Freq: Every day | ORAL | 3 refills | Status: DC

## 2017-01-22 NOTE — Telephone Encounter (Signed)
90 day supply sent in for losartan

## 2017-01-24 ENCOUNTER — Encounter: Payer: Self-pay | Admitting: Internal Medicine

## 2017-01-24 ENCOUNTER — Ambulatory Visit (INDEPENDENT_AMBULATORY_CARE_PROVIDER_SITE_OTHER): Payer: Medicare HMO | Admitting: Internal Medicine

## 2017-01-24 DIAGNOSIS — R1032 Left lower quadrant pain: Secondary | ICD-10-CM

## 2017-01-24 DIAGNOSIS — G8929 Other chronic pain: Secondary | ICD-10-CM

## 2017-01-24 MED ORDER — CYCLOBENZAPRINE HCL 5 MG PO TABS: 5.0000 mg | ORAL_TABLET | Freq: Two times a day (BID) | ORAL | 0 refills | Status: DC | PRN

## 2017-01-24 MED ORDER — MIRTAZAPINE 15 MG PO TABS: 15.0000 mg | ORAL_TABLET | Freq: Every day | ORAL | 6 refills | Status: DC

## 2017-01-24 NOTE — Progress Notes (Signed)
Pre visit review using our clinic review tool, if applicable. No additional management support is needed unless otherwise documented below in the visit note. 

## 2017-01-24 NOTE — Assessment & Plan Note (Signed)
Rx for flexeril for pain as she has had reaction to bentyl in the past. Advised to start doing fiber to help as well. If no resolution call back. No indication for imaging at this time.

## 2017-01-24 NOTE — Progress Notes (Signed)
   Subjective:    Patient ID: Alyssa Lindsey, female    DOB: 05/15/1929, 83 y.o.   MRN: [id number]  HPI The patient is an 83 YO female coming in for some urgency with bowel movements and discomfort LLQ. She has had this off and on in the past and calls it her IBS. She denies change to diet or habits in the last several weeks. Going on for about 1 week. Stools 1-2 per day. No true diarrhea or liquid stools. Pain is rated 3/10 in the LLQ and she has not tried anything for it. She does have tramadol at home but did not try this. No fevers or chills. Denies constipation. Appetite is the same and no nausea or vomiting.   Review of Systems  Constitutional: Negative.   HENT: Negative.   Respiratory: Negative.   Cardiovascular: Negative.   Gastrointestinal: Positive for abdominal pain. Negative for abdominal distention, anal bleeding, blood in stool, constipation, diarrhea, nausea, rectal pain and vomiting.  Musculoskeletal: Negative.   Skin: Negative.   Neurological: Negative.       Objective:   Physical Exam  Constitutional: She is oriented to person, place, and time. She appears well-developed and well-nourished.  HENT:  Head: Normocephalic and atraumatic.  Eyes: EOM are normal.  Neck: Normal range of motion.  Cardiovascular: Normal rate and regular rhythm.   Pulmonary/Chest: Effort normal and breath sounds normal. No respiratory distress. She has no wheezes. She has no rales.  Abdominal: Soft. Bowel sounds are normal. She exhibits no distension and no mass. There is no tenderness. There is no rebound and no guarding.  Neurological: She is alert and oriented to person, place, and time.  Skin: Skin is warm and dry.   Vitals:   01/24/17 0822  BP: 130/70  Pulse: (!) 55  Temp: 98.1 F (36.7 C)  TempSrc: Oral  SpO2: 99%  Weight: 158 lb (71.7 kg)  Height: 5\' 5"  (1.651 m)      Assessment & Plan:

## 2017-01-24 NOTE — Patient Instructions (Signed)
We have sent in the flexeril which is the muscle relaxer for the pain.  Keep taking the metamucil for the stomach for the next couple of weeks every day.   We will have you take 1/2 pill of the remeron (mirtazepine) at night time.

## 2017-03-01 ENCOUNTER — Other Ambulatory Visit: Payer: Self-pay | Admitting: Internal Medicine

## 2017-03-28 ENCOUNTER — Other Ambulatory Visit: Payer: Self-pay | Admitting: Internal Medicine

## 2017-04-09 ENCOUNTER — Telehealth: Payer: Self-pay

## 2017-04-09 NOTE — Telephone Encounter (Signed)
Attempted to call patient to schedule awv. Patient did not answer, vm was not available.

## 2017-04-09 NOTE — Telephone Encounter (Signed)
Patient is on the list for [employer] [facility] and may be a good candidate for an AWV. Please let me know if/when appt is scheduled.   

## 2017-05-15 ENCOUNTER — Other Ambulatory Visit: Payer: Self-pay | Admitting: Cardiology

## 2017-05-17 MED ORDER — RIVAROXABAN 15 MG PO TABS: 15.0000 mg | ORAL_TABLET | Freq: Every day | ORAL | 3 refills | Status: DC

## 2017-05-20 ENCOUNTER — Other Ambulatory Visit: Payer: Self-pay

## 2017-05-20 NOTE — Progress Notes (Signed)
Cardiology Office Note:    Date:  05/21/2017   ID:  Alyssa Lindsey, DOB Aug 20, 1929, MRN [id number]  PCP:  Myrlene Brokerrawford, Elizabeth A, MD  Cardiologist:  Dr Antoine PocheHochrein  Referring MD: Myrlene Brokerrawford, Elizabeth A, *   Chief Complaint  Patient presents with  . Follow-up    PAF    History of Present Illness:    Alyssa PerchesDorothy B Lindsey is a 83 y.o. female with a hx of Paroxysmal atrial fibrillation on Xarelto, type 2 diabetes, hypertension and recent memory issues.   The patient was last seen in office by Dr. Antoine PocheHochrein on 07/04/16 at which time she was doing well symptom-wise however she was having trouble affording Xarelto. It was noted that her CHA2DS2/VAS Stroke Risk score was 5 with a risk of stroke of 6.7% and it HAS BLED score of 2 with a low risk of bleeding. She was noted to have paroxysmal atrial fibrillation per Holter monitor in 02/2014. Since that time the patient reported that her insurance was going to cover Xarelto and she would not need to switch to warfarin.    Alyssa Lindsey is currently taking the lower dose of Xarelto, adjusted for renal function,  without any unusual bleeding. She does daily light housework without any exertional chest discomfort of dyspnea. She does not do any formal exercise. She denies palpitations, lightheadedness, orthopnea, PND, edema or syncope.   Past Medical History:  Diagnosis Date  . Arthritis   . Chronic neck pain   . Diverticulosis of colon (without mention of hemorrhage)   . Family history of malignant neoplasm of gastrointestinal tract   . Gastritis   . Hiatal hernia   . HTN (hypertension)   . LBP (low back pain)   . Memory problem   . Spondylosis   . Type II or unspecified type diabetes mellitus without mention of complication, not stated as uncontrolled     Past Surgical History:  Procedure Laterality Date  . COLONOSCOPY  2010   NORMAL-- DUE 2020  . TUBAL LIGATION      Current Medications: Current Meds  Medication Sig  . amLODipine (NORVASC) 10  MG tablet Take 1 tablet (10 mg total) by mouth daily.  . cetirizine (ZYRTEC) 10 MG tablet TAKE ONE TABLET BY MOUTH ONCE DAILY  . cyclobenzaprine (FLEXERIL) 5 MG tablet TAKE 1 TABLET BY MOUTH TWICE DAILY AS NEEDED FOR MUSCLE SPASM  . famotidine (PEPCID) 20 MG tablet TAKE ONE TABLET BY MOUTH TWICE DAILY  . losartan-hydrochlorothiazide (HYZAAR) 50-12.5 MG tablet Take 1 tablet by mouth daily.  . metoprolol tartrate (LOPRESSOR) 25 MG tablet Take 0.5 tablets (12.5 mg total) by mouth 2 (two) times daily.  . mirtazapine (REMERON) 15 MG tablet Take 1 tablet (15 mg total) by mouth at bedtime.  . Rivaroxaban (XARELTO) 15 MG TABS tablet Take 1 tablet (15 mg total) by mouth daily with supper.  . traMADol (ULTRAM) 50 MG tablet TAKE ONE TABLET BY MOUTH EVERY 8 HOURS AS NEEDED  . VOLTAREN 1 % GEL APPLY TWO GRAMS THREE TIMES DAILY AS NEEDED  . [DISCONTINUED] amLODipine (NORVASC) 10 MG tablet TAKE ONE TABLET BY MOUTH ONCE DAILY  . [DISCONTINUED] losartan-hydrochlorothiazide (HYZAAR) 50-12.5 MG tablet Take 1 tablet by mouth daily.  . [DISCONTINUED] metoprolol tartrate (LOPRESSOR) 25 MG tablet TAKE ONE-HALF TABLET BY MOUTH TWICE DAILY  . [DISCONTINUED] Rivaroxaban (XARELTO) 15 MG TABS tablet Take 1 tablet (15 mg total) by mouth daily with supper.  . [DISCONTINUED] rivaroxaban (XARELTO) 20 MG TABS tablet Take 1 tablet (20  mg total) by mouth daily with supper.     Allergies:   Dicyclomine hcl; Lisinopril; and Aspirin   Social History   Social History  . Marital status: Married    Spouse name: N/A  . Number of children: 4  . Years of education: N/A   Occupational History  . Retired Advertising copywriter    Social History Main Topics  . Smoking status: Never Smoker  . Smokeless tobacco: Never Used  . Alcohol use No  . Drug use: No  . Sexual activity: Not on file   Other Topics Concern  . Not on file   Social History Narrative   Widow    4 children: 1 son '59; 3 dtrs '53, '62, '64; 9 grandchildren;  great-grand   Lives with dtr at home, grandson at home      Daily Caffeine Use:  1 cup   Regular Exercise -  NO              Family History: The patient's family history includes Breast cancer in her sister and sister; Colon cancer in her daughter; Diabetes in her daughter; Emphysema in her brother; Liver disease in her brother; Lung cancer in her daughter; Ovarian cancer in her sister; Stroke (age of onset: 81) in her father; Throat cancer in her brother. ROS:   Please see the history of present illness.     All other systems reviewed and are negative.  EKGs/Labs/Other Studies Reviewed:    The following studies were reviewed today: None  EKG:  EKG is  ordered today.  The ekg ordered today demonstrates sinus bradycardia at 58 bpm, no significant changes compared to 07/04/16. QTC 376  Recent Labs: 07/05/2016: ALT 9; BUN 23; Creatinine, Ser 1.73; Hemoglobin 12.8; Platelets 200.0; Potassium 3.4; Sodium 141   Recent Lipid Panel    Component Value Date/Time   CHOL 164 12/26/2015 1001   TRIG 132.0 12/26/2015 1001   HDL 44.40 12/26/2015 1001   CHOLHDL 4 12/26/2015 1001   VLDL 26.4 12/26/2015 1001   LDLCALC 93 12/26/2015 1001    Physical Exam:    VS:  BP (!) 152/66 (BP Location: Right Arm, Patient Position: Sitting, Cuff Size: Normal)   Pulse (!) 58   Ht 5\' 5"  (1.651 m)   Wt 162 lb 6.4 oz (73.7 kg)   BMI 27.02 kg/m     Wt Readings from Last 3 Encounters:  05/21/17 162 lb 6.4 oz (73.7 kg)  01/24/17 158 lb (71.7 kg)  11/20/16 154 lb 12 oz (70.2 kg)     GEN: Well nourished, well developed in no acute distress HEENT: Normal NECK: No JVD; No carotid bruits LYMPHATICS: No lymphadenopathy CARDIAC: RRR, no murmurs, rubs, gallops RESPIRATORY:  Clear to auscultation without rales, wheezing or rhonchi  ABDOMEN: Soft, non-tender, non-distended MUSCULOSKELETAL:  Trace pretibial edema; No deformity  SKIN: Warm and dry NEUROLOGIC:  Alert and oriented x 3 PSYCHIATRIC:  Normal  affect   ASSESSMENT:    1. Paroxysmal A-fib (HCC)   2. Essential hypertension   3. Controlled type 2 diabetes mellitus without complication, without long-term current use of insulin (HCC) Chronic  4. Chronic renal impairment, unspecified CKD stage    PLAN:    In order of problems listed above:  Paroxysmal atrial fibrillation first noted in 02/2014 -Maintaining NSR -Had PAF per holter in 02/2014 -Xarelto for stroke risk reduction related to atrial fib, on lower dose of 15 mg due to kidney function, conitnue metoprolol. Will update labs today with BMet  to assess renal function   Hypertension -BP elevated today. Pt has not taken her medications yet this am.  -Continue amlodipine 10, Hyzaar 50/12.5 -Check BMet -Pt with trace pretibial edema. Advised on low sodium diet.   Type 2 diabetes -A1c 6.4 in 04/2016 -Was taken off medication by PCP -Followed by PCP  Chronic renal insufficiency -Last serum creatinine was 1.73 on 07/05/2016 which is elevated from her normal -Xarelto dose was adjusted for renal function -Check BMet today.   Medication Adjustments/Labs and Tests Ordered: Current medicines are reviewed at length with the patient today.  Concerns regarding medicines are outlined above. Labs and tests ordered and medication changes are outlined in the patient instructions below:  Patient Instructions    Medication Instructions:  Your physician recommends that you continue on your current medications as directed. Please refer to the Current Medication list given to you today.   Labwork: Your physician recommends that you have lab work today: bmet   Testing/Procedures: -None  Follow-Up: Your physician wants you to follow-up in: 1 year with Dr. Arthur Lions. You will receive a reminder letter in the mail two months in advance. If you don't receive a letter, please call our office to schedule the follow-up appointment.   Any Other Special Instructions Will Be Listed Below (If  Applicable).     If you need a refill on your cardiac medications before your next appointment, please call your pharmacy.    Dietary changes including reduction of concentrated sweets, increase fresh fruits, vegetables and whole grains and limit processed foods.   Limit the sodium in your diet. Excess salt is mostly found in processed foods and restaurant meals. Use other sources to flavor food like peppers, garlic and onions.   Signed, Berton Bon, NP  05/21/2017 12:05 PM    [facility]

## 2017-05-21 ENCOUNTER — Encounter: Payer: Self-pay | Admitting: Cardiology

## 2017-05-21 ENCOUNTER — Ambulatory Visit (INDEPENDENT_AMBULATORY_CARE_PROVIDER_SITE_OTHER): Payer: Medicare HMO | Admitting: Cardiology

## 2017-05-21 VITALS — BP 152/66 | HR 58 | Ht 65.0 in | Wt 162.4 lb

## 2017-05-21 DIAGNOSIS — I10 Essential (primary) hypertension: Secondary | ICD-10-CM | POA: Diagnosis not present

## 2017-05-21 DIAGNOSIS — I480 Paroxysmal atrial fibrillation: Secondary | ICD-10-CM | POA: Diagnosis not present

## 2017-05-21 DIAGNOSIS — N189 Chronic kidney disease, unspecified: Secondary | ICD-10-CM | POA: Diagnosis not present

## 2017-05-21 DIAGNOSIS — E119 Type 2 diabetes mellitus without complications: Secondary | ICD-10-CM

## 2017-05-21 MED ORDER — METOPROLOL TARTRATE 25 MG PO TABS: 12.5000 mg | ORAL_TABLET | Freq: Two times a day (BID) | ORAL | 11 refills | Status: DC

## 2017-05-21 MED ORDER — RIVAROXABAN 15 MG PO TABS: 15.0000 mg | ORAL_TABLET | Freq: Every day | ORAL | 11 refills | Status: DC

## 2017-05-21 MED ORDER — LOSARTAN POTASSIUM-HCTZ 50-12.5 MG PO TABS: 1.0000 | ORAL_TABLET | Freq: Every day | ORAL | 11 refills | Status: DC

## 2017-05-21 MED ORDER — AMLODIPINE BESYLATE 10 MG PO TABS: 10.0000 mg | ORAL_TABLET | Freq: Every day | ORAL | 11 refills | Status: DC

## 2017-05-21 NOTE — Patient Instructions (Addendum)
   Medication Instructions:  Your physician recommends that you continue on your current medications as directed. Please refer to the Current Medication list given to you today.   Labwork: Your physician recommends that you have lab work today: bmet   Testing/Procedures: -None  Follow-Up: Your physician wants you to follow-up in: 1 year with Dr. Richland LionsHochrien. You will receive a reminder letter in the mail two months in advance. If you don't receive a letter, please call our office to schedule the follow-up appointment.   Any Other Special Instructions Will Be Listed Below (If Applicable).     If you need a refill on your cardiac medications before your next appointment, please call your pharmacy.    Dietary changes including reduction of concentrated sweets, increase fresh fruits, vegetables and whole grains and limit processed foods.   Limit the sodium in your diet. Excess salt is mostly found in processed foods and restaurant meals. Use other sources to flavor food like peppers, garlic and onions.

## 2017-05-22 ENCOUNTER — Other Ambulatory Visit: Payer: Self-pay | Admitting: Internal Medicine

## 2017-05-22 LAB — BASIC METABOLIC PANEL
BUN/Creatinine Ratio: 15 (ref 12–28)
BUN: 22 mg/dL (ref 8–27)
CALCIUM: 9.7 mg/dL (ref 8.7–10.3)
CHLORIDE: 105 mmol/L (ref 96–106)
CO2: 24 mmol/L (ref 20–29)
Creatinine, Ser: 1.47 mg/dL — ABNORMAL HIGH (ref 0.57–1.00)
GFR, EST AFRICAN AMERICAN: 37 mL/min/{1.73_m2} — AB (ref >59)
GFR, EST NON AFRICAN AMERICAN: 32 mL/min/{1.73_m2} — AB (ref >59)
Glucose: 97 mg/dL (ref 65–99)
Potassium: 4.3 mmol/L (ref 3.5–5.2)
Sodium: 144 mmol/L (ref 134–144)

## 2017-05-27 ENCOUNTER — Telehealth: Payer: Self-pay

## 2017-05-27 NOTE — Telephone Encounter (Signed)
Attempted to call patient for results. No machine ever picked up. Will attempt to call patient back later

## 2017-05-28 ENCOUNTER — Encounter: Payer: Self-pay | Admitting: *Deleted

## 2017-08-06 ENCOUNTER — Encounter: Payer: Self-pay | Admitting: Internal Medicine

## 2017-08-06 ENCOUNTER — Ambulatory Visit (INDEPENDENT_AMBULATORY_CARE_PROVIDER_SITE_OTHER): Payer: Medicare HMO | Admitting: Internal Medicine

## 2017-08-06 VITALS — BP 140/70 | HR 50 | Temp 98.5°F | Ht 65.0 in | Wt 153.0 lb

## 2017-08-06 DIAGNOSIS — M545 Low back pain, unspecified: Secondary | ICD-10-CM

## 2017-08-06 DIAGNOSIS — Z23 Encounter for immunization: Secondary | ICD-10-CM | POA: Diagnosis not present

## 2017-08-06 MED ORDER — PREDNISONE 20 MG PO TABS: 40.0000 mg | ORAL_TABLET | Freq: Every day | ORAL | 0 refills | Status: DC

## 2017-08-06 NOTE — Assessment & Plan Note (Signed)
Acute, rx for prednisone for pain relief. Advised she should try tramadol if tylenol does not help for pain which she already has. No red flag signs that require imaging.

## 2017-08-06 NOTE — Progress Notes (Signed)
   Subjective:    Patient ID: Alyssa Lindsey, female    DOB: 06-14-1929, 83 y.o.   MRN: [id number]  HPI The patient is an 83 YO female coming in for left back pain that radiates to her neck. Started within the last week. About 6/10 pain. Has tried tylenol without relief. Has not tried tramadol or flexeril. She denies injury or overuse to the area. Denies numbness or tingling in her legs or arms. Overall stable from onset. Nothing makes it better or worse. Has not changed her activity.   Review of Systems  Constitutional: Positive for activity change. Negative for appetite change, fatigue, fever and unexpected weight change.  Respiratory: Negative.   Cardiovascular: Negative.   Gastrointestinal: Negative.   Musculoskeletal: Positive for arthralgias, back pain, myalgias and neck pain. Negative for gait problem, joint swelling and neck stiffness.  Skin: Negative.   Neurological: Negative.       Objective:   Physical Exam  Constitutional: She is oriented to person, place, and time. She appears well-developed and well-nourished.  HENT:  Head: Normocephalic and atraumatic.  Eyes: EOM are normal.  Neck: Normal range of motion. Neck supple.  Cardiovascular: Normal rate and regular rhythm.   Pulmonary/Chest: Effort normal and breath sounds normal.  Abdominal: Soft.  Musculoskeletal: She exhibits tenderness.  Tenderness lumbar to cervical paraspinal. No spinal tenderness  Neurological: She is alert and oriented to person, place, and time.  Skin: Skin is warm and dry.   Vitals:   08/06/17 1048  BP: 140/70  Pulse: (!) 50  Temp: 98.5 F (36.9 C)  TempSrc: Oral  SpO2: 100%  Weight: 153 lb (69.4 kg)  Height: 5\' 5"  (1.651 m)      Assessment & Plan:  Flu shot given at visit

## 2017-08-06 NOTE — Patient Instructions (Signed)
We have sent in the prednisone for the back. Take 2 pills daily for 4 days to help it get better.

## 2017-08-13 ENCOUNTER — Ambulatory Visit (INDEPENDENT_AMBULATORY_CARE_PROVIDER_SITE_OTHER)
Admission: RE | Admit: 2017-08-13 | Discharge: 2017-08-13 | Disposition: A | Discharge status: Home / Self Care | Payer: Medicare HMO | Source: Ambulatory Visit | Attending: Internal Medicine | Admitting: Internal Medicine

## 2017-08-13 ENCOUNTER — Encounter: Payer: Self-pay | Admitting: Internal Medicine

## 2017-08-13 ENCOUNTER — Other Ambulatory Visit: Payer: Medicare HMO

## 2017-08-13 ENCOUNTER — Ambulatory Visit (INDEPENDENT_AMBULATORY_CARE_PROVIDER_SITE_OTHER): Payer: Medicare HMO | Admitting: Internal Medicine

## 2017-08-13 VITALS — BP 128/86 | HR 51 | Temp 98.7°F | Ht 65.0 in | Wt 156.0 lb

## 2017-08-13 DIAGNOSIS — M545 Low back pain, unspecified: Secondary | ICD-10-CM

## 2017-08-13 DIAGNOSIS — M48061 Spinal stenosis, lumbar region without neurogenic claudication: Secondary | ICD-10-CM | POA: Diagnosis not present

## 2017-08-13 MED ORDER — HYDROCODONE-ACETAMINOPHEN 5-325 MG PO TABS: 1.0000 | ORAL_TABLET | Freq: Four times a day (QID) | ORAL | 0 refills | Status: DC | PRN

## 2017-08-13 NOTE — Progress Notes (Signed)
   Subjective:    Patient ID: Alyssa Lindsey, female    DOB: 28-Aug-1929, 83 y.o.   MRN: [id number]  HPI The patient is an 83 YO female coming in for back pain which is not improved. She was seen about 1 week ago and given prednisone burst. She has not been using voltaren gel or flexeril at home although she has those available. She has had improvement in the neck pain but still having back pain in the left lower back. Denies overuse or injury. Worse in the morning and improves some with moving. She states 8/10 pain and keeping her from normal activities. No weight changes. No fevers or chills. Denies numbness or pain radiating down the legs or arms. She has tried tramadol which is not effective.   Review of Systems  Constitutional: Positive for activity change. Negative for appetite change, chills, fatigue, fever and unexpected weight change.  Respiratory: Negative.   Cardiovascular: Negative.   Gastrointestinal: Negative.   Musculoskeletal: Positive for arthralgias and back pain. Negative for gait problem, myalgias, neck pain and neck stiffness.  Skin: Negative.   Neurological: Negative.   Psychiatric/Behavioral: Negative.       Objective:   Physical Exam  Constitutional: She is oriented to person, place, and time. She appears well-developed and well-nourished.  HENT:  Head: Normocephalic and atraumatic.  Eyes: EOM are normal.  Neck: Normal range of motion.  Cardiovascular: Normal rate and regular rhythm.   Pulmonary/Chest: Effort normal. No respiratory distress. She has no wheezes. She has no rales.  Abdominal: Soft. She exhibits no distension. There is no tenderness. There is no rebound.  Musculoskeletal: She exhibits tenderness.  Pain in left lower back and paraspinal tenderness.   Neurological: She is alert and oriented to person, place, and time. Coordination abnormal.  Slow gait  Skin: Skin is warm and dry.   Vitals:   08/13/17 1110  BP: 128/86  Pulse: (!) 51  Temp: 98.7  F (37.1 C)  TempSrc: Oral  SpO2: 99%  Weight: 156 lb (70.8 kg)  Height:  (1.651 m)      Assessment & Plan:

## 2017-08-13 NOTE — Patient Instructions (Signed)
We have given you a prescription for vicodin for the back pain temporarily.  Also try the voltaren gel you have at home.   Also you can try the muscle relaxer you have at home called cyclobenzaprine (flexeril).   We are getting an x-ray today.

## 2017-08-14 NOTE — Assessment & Plan Note (Addendum)
Neck component is improved, checking x-ray for occult compression fracture. She is encouraged to use voltaren gel, flexeril, and small rx for vicodin as tramadol not effective at home to use for pain.

## 2017-10-23 ENCOUNTER — Other Ambulatory Visit: Payer: Self-pay | Admitting: Family

## 2017-11-10 ENCOUNTER — Other Ambulatory Visit: Payer: Self-pay | Admitting: Internal Medicine

## 2017-11-10 ENCOUNTER — Other Ambulatory Visit: Payer: Self-pay | Admitting: Family

## 2017-11-12 ENCOUNTER — Telehealth: Payer: Self-pay | Admitting: Internal Medicine

## 2017-11-12 NOTE — Telephone Encounter (Signed)
Copied from CRM [id number]. Topic: Quick Communication - See Telephone Encounter >> Nov 12, 2017  2:11 PM Arlyss Gandyichardson, Keyanah Kozicki N, NT wrote: CRM for notification. See Telephone encounter for: Pt needs refill of Pepcid. Uses [facility] on [address]  11/12/17.

## 2017-11-13 MED ORDER — FAMOTIDINE 20 MG PO TABS: 20.0000 mg | ORAL_TABLET | Freq: Two times a day (BID) | ORAL | 2 refills | Status: DC

## 2017-11-13 NOTE — Telephone Encounter (Signed)
Last refill 05/22/17 60 tabs 2 refills

## 2017-11-18 ENCOUNTER — Ambulatory Visit: Payer: Medicare HMO | Admitting: Internal Medicine

## 2017-11-18 ENCOUNTER — Encounter: Payer: Self-pay | Admitting: Internal Medicine

## 2017-11-18 VITALS — BP 140/70 | HR 49 | Temp 97.8°F | Ht 65.0 in | Wt 149.0 lb

## 2017-11-18 DIAGNOSIS — K5900 Constipation, unspecified: Secondary | ICD-10-CM

## 2017-11-18 DIAGNOSIS — R1032 Left lower quadrant pain: Secondary | ICD-10-CM

## 2017-11-18 DIAGNOSIS — G8929 Other chronic pain: Secondary | ICD-10-CM | POA: Diagnosis not present

## 2017-11-18 DIAGNOSIS — M25561 Pain in right knee: Secondary | ICD-10-CM | POA: Diagnosis not present

## 2017-11-18 MED ORDER — CYCLOBENZAPRINE HCL 5 MG PO TABS: ORAL_TABLET | ORAL | 0 refills | Status: DC

## 2017-11-18 MED ORDER — DICLOFENAC SODIUM 1 % TD GEL: TRANSDERMAL | 3 refills | Status: DC

## 2017-11-18 NOTE — Assessment & Plan Note (Signed)
Slightly worse lately with constipation and encouraged her to use miralax and metamucil for constipation. No indication for imaging today.

## 2017-11-18 NOTE — Progress Notes (Signed)
   Subjective:    Patient ID: Alyssa Lindsey, female    DOB: 07-12-1929, 83 y.o.   MRN: [id number]  HPI The patient is an 83 YO female coming in for several concerns including stomach pain (has IBS and is in a flare, takes pepcid which helps some, denies blood in stool, some constipation, denies diarrhea, not taking her metamucil lately), constipation (she is taking ibguard for this, usually takes metamucil, eating different foods lately and thinks that this is the culprit, denies blood in stool, denies nausea or vomiting), and knee pain (denies fall, pain is posterior to her knee cap, feels deep inside, has tried tylenol which was not very effective, her tramadol which she takes BID which did not help, denies injury or overuse, no new shoes, worse with walking or bending, out of voltaren and the muscle relaxer).   Review of Systems  Constitutional: Positive for activity change. Negative for appetite change, chills, fatigue, fever and unexpected weight change.  HENT: Negative.   Eyes: Negative.   Respiratory: Negative.   Cardiovascular: Negative.   Gastrointestinal: Positive for abdominal distention, abdominal pain and constipation. Negative for blood in stool, nausea, rectal pain and vomiting.  Musculoskeletal: Positive for arthralgias and myalgias. Negative for back pain, gait problem and joint swelling.  Skin: Negative.   Neurological: Negative.   Psychiatric/Behavioral: Negative.       Objective:   Physical Exam  Constitutional: She is oriented to person, place, and time. She appears well-developed and well-nourished.  HENT:  Head: Normocephalic and atraumatic.  Eyes: EOM are normal.  Neck: Normal range of motion.  Cardiovascular: Normal rate and regular rhythm.  Pulmonary/Chest: Effort normal and breath sounds normal. No respiratory distress. She has no wheezes. She has no rales.  Abdominal: Soft. Bowel sounds are normal. She exhibits no distension. There is no tenderness. There is  no rebound.  Normal BS and no tenderness on exam  Musculoskeletal: She exhibits tenderness. She exhibits no edema.  Pain at the posterior knee, no pain along the LCL or MCL and ACL/PCL intact, no effusion  Neurological: She is alert and oriented to person, place, and time. Coordination normal.  Skin: Skin is warm and dry.  Psychiatric: She has a normal mood and affect.   Vitals:   11/18/17 0848  BP: 140/70  Pulse: (!) 49  Temp: 97.8 F (36.6 C)  TempSrc: Oral  SpO2: 99%  Weight: 149 lb (67.6 kg)  Height: 5\' 5"  (1.651 m)      Assessment & Plan:

## 2017-11-18 NOTE — Assessment & Plan Note (Signed)
We talked about linzess for constipation but she elects to use otc products which she feels are safer and generally are effective. She is reminded that this is a long term problem and she needs to take metamucil or miralax daily so she does not have pain and is not constipated as this can cause back pain, stomach pain.

## 2017-11-18 NOTE — Assessment & Plan Note (Signed)
Rx for voltaren and flexeril today for the pain. Given her xarelto she cannot take NSAIDS oral. She also has tramadol and tylenol which she can use for pain. Advised to use a cane in the right hand to help with stability.

## 2017-11-18 NOTE — Patient Instructions (Signed)
We have refilled the gel voltaren and the muscle relaxer to use for the pain in the knees.   Start taking the metamucil to help with the bowels.

## 2017-12-09 ENCOUNTER — Other Ambulatory Visit: Payer: Self-pay | Admitting: Internal Medicine

## 2017-12-18 DIAGNOSIS — M62838 Other muscle spasm: Secondary | ICD-10-CM | POA: Diagnosis not present

## 2017-12-18 DIAGNOSIS — K5900 Constipation, unspecified: Secondary | ICD-10-CM | POA: Diagnosis not present

## 2017-12-18 DIAGNOSIS — Z7901 Long term (current) use of anticoagulants: Secondary | ICD-10-CM | POA: Diagnosis not present

## 2017-12-18 DIAGNOSIS — R69 Illness, unspecified: Secondary | ICD-10-CM | POA: Diagnosis not present

## 2017-12-18 DIAGNOSIS — Z7982 Long term (current) use of aspirin: Secondary | ICD-10-CM | POA: Diagnosis not present

## 2017-12-18 DIAGNOSIS — K219 Gastro-esophageal reflux disease without esophagitis: Secondary | ICD-10-CM | POA: Diagnosis not present

## 2017-12-18 DIAGNOSIS — I4891 Unspecified atrial fibrillation: Secondary | ICD-10-CM | POA: Diagnosis not present

## 2017-12-18 DIAGNOSIS — I10 Essential (primary) hypertension: Secondary | ICD-10-CM | POA: Diagnosis not present

## 2017-12-18 DIAGNOSIS — E119 Type 2 diabetes mellitus without complications: Secondary | ICD-10-CM | POA: Diagnosis not present

## 2017-12-18 DIAGNOSIS — G8929 Other chronic pain: Secondary | ICD-10-CM | POA: Diagnosis not present

## 2018-02-17 ENCOUNTER — Telehealth: Payer: Self-pay | Admitting: Internal Medicine

## 2018-02-17 NOTE — Telephone Encounter (Signed)
2nd call to patient regarding AWV. Patient did not answer, could not leave voicemail. SF

## 2018-04-24 ENCOUNTER — Other Ambulatory Visit: Payer: Self-pay | Admitting: Internal Medicine

## 2018-05-07 ENCOUNTER — Other Ambulatory Visit: Payer: Self-pay | Admitting: Internal Medicine

## 2018-05-07 NOTE — Telephone Encounter (Signed)
LOV: 11/18/2017

## 2018-05-19 NOTE — Progress Notes (Signed)
HPI The patient presents for evaluation of atrial fibrillation.     Since I last saw her she has done well.  The patient denies any new symptoms such as chest discomfort, neck or arm discomfort. There has been no new shortness of breath, PND or orthopnea. There have been no reported palpitations, presyncope or syncope.  She works in her garden.  The patient denies any new symptoms such as chest discomfort, neck or arm discomfort. There has been no new shortness of breath, PND or orthopnea. There have been no reported palpitations, presyncope or syncope.   Allergies  Allergen Reactions  . Dicyclomine Hcl Other (See Comments)    Severe eye pain  . Lisinopril Cough  . Aspirin Other (See Comments)    Unknown reaction    Current Outpatient Medications  Medication Sig Dispense Refill  . amLODipine (NORVASC) 10 MG tablet Take 1 tablet (10 mg total) by mouth daily. 30 tablet 11  . cetirizine (ZYRTEC) 10 MG tablet TAKE ONE TABLET BY MOUTH ONCE DAILY 30 tablet 11  . cyclobenzaprine (FLEXERIL) 5 MG tablet TAKE 1 TABLET BY MOUTH TWICE DAILY AS NEEDED FOR MUSCLE SPASM 30 tablet 3  . diclofenac sodium (VOLTAREN) 1 % GEL APPLY TWO GRAMS THREE TIMES DAILY AS NEEDED 100 g 3  . famotidine (PEPCID) 20 MG tablet Take 1 tablet (20 mg total) by mouth 2 (two) times daily. 60 tablet 2  . losartan-hydrochlorothiazide (HYZAAR) 50-12.5 MG tablet Take 1 tablet by mouth daily. 30 tablet 11  . metoprolol tartrate (LOPRESSOR) 25 MG tablet Take 0.5 tablets (12.5 mg total) by mouth 2 (two) times daily. 60 tablet 11  . mirtazapine (REMERON) 15 MG tablet TAKE 1 TABLET BY MOUTH AT BEDTIME 90 tablet 1  . Rivaroxaban (XARELTO) 15 MG TABS tablet Take 1 tablet (15 mg total) by mouth daily with supper. 30 tablet 11  . traMADol (ULTRAM) 50 MG tablet TAKE 1 TABLET BY MOUTH EVERY 8 HOURS AS NEEDED 60 tablet 2   No current facility-administered medications for this visit.     Past Medical History:  Diagnosis Date  .  Arthritis   . Chronic neck pain   . Diverticulosis of colon (without mention of hemorrhage)   . Family history of malignant neoplasm of gastrointestinal tract   . Gastritis   . Hiatal hernia   . HTN (hypertension)   . LBP (low back pain)   . Memory problem   . Spondylosis   . Type II or unspecified type diabetes mellitus without mention of complication, not stated as uncontrolled     Past Surgical History:  Procedure Laterality Date  . COLONOSCOPY  2010   NORMAL-- DUE 2020  . TUBAL LIGATION      ROS:  Decreased memory.  Otherwise as stated in the HPI and negative for all other systems.  PHYSICAL EXAM BP (!) 156/68   Pulse (!) 57   Ht 5\' 5"  (1.651 m)   Wt 158 lb (71.7 kg)   BMI 26.29 kg/m   GENERAL:  Well appearing NECK:  No jugular venous distention, waveform within normal limits, carotid upstroke brisk and symmetric, no bruits, no thyromegaly LUNGS:  Clear to auscultation bilaterally CHEST:  Unremarkable HEART:  PMI not displaced or sustained,S1 and S2 within normal limits, no S3, no S4, no clicks, no rubs, 2 out of 6 brief apical systolic murmur heard best at the apex and right upper sternal border, no diastolic murmurs ABD:  Flat, positive bowel sounds normal  in frequency in pitch, no bruits, no rebound, no guarding, no midline pulsatile mass, no hepatomegaly, no splenomegaly EXT:  2 plus pulses throughout, no edema, no cyanosis no clubbing   EKG:  NSR, rate 57 axis WNL intervals WNL, no acute ST T wave changes. PACs  05/20/2018   ASSESSMENT AND PLAN  ATRIAL FIB:  Ms. CAREL SCHNEE has a CHA2DS2 - VASc score of 5 with a risk of stroke of 6.7% .      She had a Holter with PAF.  She does not really feel this.  She tolerates atrial fib.    HTN:  Her BP is mildly elevated.  I gave her instructions for a BP diary.  She will keep this and let me know if her BPs are not at target and I would adjust her meds.

## 2018-05-20 ENCOUNTER — Encounter: Payer: Self-pay | Admitting: Cardiology

## 2018-05-20 ENCOUNTER — Ambulatory Visit (INDEPENDENT_AMBULATORY_CARE_PROVIDER_SITE_OTHER): Payer: Medicare HMO | Admitting: Cardiology

## 2018-05-20 VITALS — BP 156/68 | HR 57 | Ht 65.0 in | Wt 158.0 lb

## 2018-05-20 DIAGNOSIS — I480 Paroxysmal atrial fibrillation: Secondary | ICD-10-CM

## 2018-05-20 DIAGNOSIS — I10 Essential (primary) hypertension: Secondary | ICD-10-CM

## 2018-05-20 NOTE — Patient Instructions (Signed)
Medication Instructions:  Continue current medications  If you need a refill on your cardiac medications before your next appointment, please call your pharmacy.  Labwork: None ordered   Testing/Procedures: None Ordered  Special Instructions: Please Keep a blood pressure diary  Follow-Up: Your physician wants you to follow-up in: 1 Year. You should receive a reminder letter in the mail two months in advance. If you do not receive a letter, please call our office [phone].      Thank you for choosing [facility]!!

## 2018-05-26 ENCOUNTER — Other Ambulatory Visit: Payer: Self-pay | Admitting: Internal Medicine

## 2018-06-05 ENCOUNTER — Other Ambulatory Visit: Payer: Self-pay | Admitting: Internal Medicine

## 2018-06-06 NOTE — Telephone Encounter (Signed)
Control database checked last refill: 04/10/2018 LOV:11/18/2017

## 2018-06-10 ENCOUNTER — Other Ambulatory Visit: Payer: Self-pay | Admitting: Cardiology

## 2018-06-10 DIAGNOSIS — I480 Paroxysmal atrial fibrillation: Secondary | ICD-10-CM

## 2018-06-10 NOTE — Telephone Encounter (Signed)
°*  STAT* If patient is at the pharmacy, call can be transferred to refill team.   1. Which medications need to be refilled? (please list name of each medication and dose if known)  Xarelto  2. Which pharmacy/location (including street and city if local pharmacy) is medication to be sent to?[phone] 3. Do they need a 30 day or 90 day supply? 30 and refills

## 2018-06-11 MED ORDER — RIVAROXABAN 15 MG PO TABS: 15.0000 mg | ORAL_TABLET | Freq: Every day | ORAL | 5 refills | Status: DC

## 2018-06-11 NOTE — Telephone Encounter (Signed)
rx sent as requested.

## 2018-06-24 ENCOUNTER — Telehealth: Payer: Self-pay | Admitting: Cardiology

## 2018-06-24 DIAGNOSIS — I480 Paroxysmal atrial fibrillation: Secondary | ICD-10-CM

## 2018-06-24 MED ORDER — AMLODIPINE BESYLATE 10 MG PO TABS: 10.0000 mg | ORAL_TABLET | Freq: Every day | ORAL | 11 refills | Status: DC

## 2018-06-24 NOTE — Telephone Encounter (Signed)
°*  STAT* If patient is at the pharmacy, call can be transferred to refill team.   1. Which medications need to be refilled? (please list name of each medication and dose if known) Amlodipine  2. Which pharmacy/location (including street and city if local pharmacy) is medication to be sent to?[facility] [phone]  3. Do they need a 30 day or 90 day supply? 30 and refills

## 2018-08-04 ENCOUNTER — Other Ambulatory Visit: Payer: Self-pay

## 2018-08-04 ENCOUNTER — Telehealth: Payer: Self-pay | Admitting: Cardiology

## 2018-08-04 DIAGNOSIS — I480 Paroxysmal atrial fibrillation: Secondary | ICD-10-CM

## 2018-08-04 MED ORDER — METOPROLOL TARTRATE 25 MG PO TABS: 12.5000 mg | ORAL_TABLET | Freq: Two times a day (BID) | ORAL | 1 refills | Status: DC

## 2018-08-04 NOTE — Telephone Encounter (Signed)
° ° ° °*  STAT* If patient is at the pharmacy, call can be transferred to refill team.   1. Which medications need to be refilled? (please list name of each medication and dose if known) metoprolol tartrate (LOPRESSOR) 25 MG tablet  2. Which pharmacy/location (including street and city if local pharmacy) is medication to be sent to?[facility] - [place], [place] - [address]  3. Do they need a 30 day or 90 day supply? 30

## 2018-08-12 ENCOUNTER — Ambulatory Visit (INDEPENDENT_AMBULATORY_CARE_PROVIDER_SITE_OTHER): Payer: Medicare HMO | Admitting: Internal Medicine

## 2018-08-12 ENCOUNTER — Other Ambulatory Visit (INDEPENDENT_AMBULATORY_CARE_PROVIDER_SITE_OTHER): Payer: Medicare HMO

## 2018-08-12 ENCOUNTER — Encounter: Payer: Self-pay | Admitting: Internal Medicine

## 2018-08-12 VITALS — BP 110/70 | HR 47 | Temp 98.3°F | Ht 65.0 in | Wt 157.0 lb

## 2018-08-12 DIAGNOSIS — R69 Illness, unspecified: Secondary | ICD-10-CM | POA: Diagnosis not present

## 2018-08-12 DIAGNOSIS — Z0000 Encounter for general adult medical examination without abnormal findings: Secondary | ICD-10-CM | POA: Diagnosis not present

## 2018-08-12 DIAGNOSIS — E041 Nontoxic single thyroid nodule: Secondary | ICD-10-CM

## 2018-08-12 DIAGNOSIS — I10 Essential (primary) hypertension: Secondary | ICD-10-CM | POA: Diagnosis not present

## 2018-08-12 DIAGNOSIS — Z23 Encounter for immunization: Secondary | ICD-10-CM | POA: Diagnosis not present

## 2018-08-12 DIAGNOSIS — I480 Paroxysmal atrial fibrillation: Secondary | ICD-10-CM

## 2018-08-12 DIAGNOSIS — E1142 Type 2 diabetes mellitus with diabetic polyneuropathy: Secondary | ICD-10-CM

## 2018-08-12 DIAGNOSIS — E538 Deficiency of other specified B group vitamins: Secondary | ICD-10-CM

## 2018-08-12 DIAGNOSIS — F4323 Adjustment disorder with mixed anxiety and depressed mood: Secondary | ICD-10-CM | POA: Diagnosis not present

## 2018-08-12 LAB — LIPID PANEL
CHOLESTEROL: 162 mg/dL (ref 0–200)
HDL: 49.1 mg/dL (ref >39.00)
LDL CALC: 93 mg/dL (ref 0–99)
NonHDL: 112.79
TRIGLYCERIDES: 100 mg/dL (ref 0.0–149.0)
Total CHOL/HDL Ratio: 3
VLDL: 20 mg/dL (ref 0.0–40.0)

## 2018-08-12 LAB — COMPREHENSIVE METABOLIC PANEL
ALBUMIN: 4.2 g/dL (ref 3.5–5.2)
ALK PHOS: 73 U/L (ref 39–117)
ALT: 8 U/L (ref 0–35)
AST: 16 U/L (ref 0–37)
BUN: 19 mg/dL (ref 6–23)
CO2: 25 mEq/L (ref 19–32)
Calcium: 9.5 mg/dL (ref 8.4–10.5)
Chloride: 105 mEq/L (ref 96–112)
Creatinine, Ser: 1.4 mg/dL — ABNORMAL HIGH (ref 0.40–1.20)
GFR: 45.71 mL/min — AB (ref >60.00)
Glucose, Bld: 100 mg/dL — ABNORMAL HIGH (ref 70–99)
POTASSIUM: 3.9 meq/L (ref 3.5–5.1)
Sodium: 142 mEq/L (ref 135–145)
TOTAL PROTEIN: 7.5 g/dL (ref 6.0–8.3)
Total Bilirubin: 0.7 mg/dL (ref 0.2–1.2)

## 2018-08-12 LAB — TSH: TSH: 3.96 u[IU]/mL (ref 0.35–4.50)

## 2018-08-12 LAB — CBC
HEMATOCRIT: 40.3 % (ref 36.0–46.0)
HEMOGLOBIN: 12.9 g/dL (ref 12.0–15.0)
MCHC: 32 g/dL (ref 30.0–36.0)
MCV: 82.1 fl (ref 78.0–100.0)
PLATELETS: 185 10*3/uL (ref 150.0–400.0)
RBC: 4.91 Mil/uL (ref 3.87–5.11)
RDW: 16.1 % — ABNORMAL HIGH (ref 11.5–15.5)
WBC: 5.4 10*3/uL (ref 4.0–10.5)

## 2018-08-12 LAB — VITAMIN B12: Vitamin B-12: 250 pg/mL (ref 211–911)

## 2018-08-12 LAB — HEMOGLOBIN A1C: HEMOGLOBIN A1C: 6.3 % (ref 4.6–6.5)

## 2018-08-12 LAB — VITAMIN D 25 HYDROXY (VIT D DEFICIENCY, FRACTURES): VITD: 18.01 ng/mL — ABNORMAL LOW (ref 30.00–100.00)

## 2018-08-12 NOTE — Progress Notes (Signed)
   Subjective:    Patient ID: Alyssa Lindsey, female    DOB: 09-23-1929, 84 y.o.   MRN: [id number]  HPI Here for medicare wellness and physical, no new complaints. Please see A/P for status and treatment of chronic medical problems.   Diet: DM since diabetic Physical activity: active in the yard Depression/mood screen: negative Hearing: intact to whispered voice, mild loss Visual acuity: grossly normal, overdue for annual eye exam  ADLs: capable Fall risk: none Home safety: good Cognitive evaluation: intact to orientation, naming, recall and repetition EOL planning: adv directives discussed  I have personally reviewed and have noted 1. The patient's medical and social history - reviewed today no changes 2. Their use of alcohol, tobacco or illicit drugs 3. Their current medications and supplements 4. The patient's functional ability including ADL's, fall risks, home safety risks and hearing or visual impairment. 5. Diet and physical activities 6. Evidence for depression or mood disorders 7. Care team reviewed and updated (available in snapshot)  Review of Systems  Constitutional: Negative.   HENT: Negative.   Eyes: Negative.   Respiratory: Negative for cough, chest tightness and shortness of breath.   Cardiovascular: Negative for chest pain, palpitations and leg swelling.  Gastrointestinal: Negative for abdominal distention, abdominal pain, constipation, diarrhea, nausea and vomiting.  Musculoskeletal: Negative.   Skin: Negative.   Neurological: Negative.   Psychiatric/Behavioral: Negative.       Objective:   Physical Exam  Constitutional: She is oriented to person, place, and time. She appears well-developed and well-nourished.  HENT:  Head: Normocephalic and atraumatic.  Eyes: EOM are normal.  Neck: Normal range of motion.  Cardiovascular: Normal rate and regular rhythm.  Pulmonary/Chest: Effort normal and breath sounds normal. No respiratory distress. She has no  wheezes. She has no rales.  Abdominal: Soft. Bowel sounds are normal. She exhibits no distension. There is no tenderness. There is no rebound.  Musculoskeletal: She exhibits no edema.  Neurological: She is alert and oriented to person, place, and time. Coordination normal.  Skin: Skin is warm and dry.  Psychiatric: She has a normal mood and affect.   Vitals:   08/12/18 0754  BP: 110/70  Pulse: (!) 47  Temp: 98.3 F (36.8 C)  TempSrc: Oral  SpO2: 97%  Weight: 157 lb (71.2 kg)  Height: 5\' 5"  (1.651 m)      Assessment & Plan:  Flu shot given at visit

## 2018-08-12 NOTE — Assessment & Plan Note (Signed)
Checking B12 level today.  

## 2018-08-12 NOTE — Assessment & Plan Note (Signed)
Checking HgA1c and adjust as needed. Diet controlled currently. On ARB and checking lipid panel. Reminded about need for eye exam. Foot exam done today.

## 2018-08-12 NOTE — Assessment & Plan Note (Signed)
Flu shot given. Pneumonia up to date. Shingrix counseled. Tetanus up to date. Colonoscopy aged out. Mammogram aged out, pap smear aged out and dexa up to date. Counseled about sun safety and mole surveillance. Counseled about the dangers of distracted driving. Given 10 year screening recommendations.  

## 2018-08-12 NOTE — Assessment & Plan Note (Signed)
Beta blocker for rate control and xarelto for stroke prevention.

## 2018-08-12 NOTE — Assessment & Plan Note (Signed)
Checking TSH.

## 2018-08-12 NOTE — Patient Instructions (Signed)

## 2018-08-12 NOTE — Assessment & Plan Note (Signed)
BP at goal on amlodipine, losartan/hctz and metoprolol. Checking CMP and adjust as needed.

## 2018-08-12 NOTE — Assessment & Plan Note (Signed)
Taking remeron and good control, no changed today.

## 2018-08-14 ENCOUNTER — Telehealth: Payer: Self-pay | Admitting: Internal Medicine

## 2018-08-14 ENCOUNTER — Telehealth: Payer: Self-pay | Admitting: Cardiology

## 2018-08-14 DIAGNOSIS — I480 Paroxysmal atrial fibrillation: Secondary | ICD-10-CM

## 2018-08-14 MED ORDER — LOSARTAN POTASSIUM-HCTZ 50-12.5 MG PO TABS: 1.0000 | ORAL_TABLET | Freq: Every day | ORAL | 3 refills | Status: DC

## 2018-08-14 MED ORDER — LOSARTAN POTASSIUM-HCTZ 50-12.5 MG PO TABS: 1.0000 | ORAL_TABLET | Freq: Every day | ORAL | 11 refills | Status: DC

## 2018-08-14 NOTE — Telephone Encounter (Signed)
She can try magnesium over the counter to see if this helps. Otherwise try taking tums to see if this helps when it starts.

## 2018-08-14 NOTE — Telephone Encounter (Signed)
Copied from CRM [id number]. Topic: Quick Communication - See Telephone Encounter >> Aug 14, 2018 12:03 PM Maia Pettiesrtiz, Kristie S wrote: CRM for notification. See Telephone encounter for: 08/14/18. Pt daughter, Gaynelle AduRobbie, states she is still having a tingling, funny, crawling feeling in her feet and calves. She states Dr. Okey Duprerawford looked [facility]. Pt asking if there is some type of medication that can help stop the funny feeling. Ok to call pt but Gaynelle AduRobbie manages her medications/care.  [facility] - [place] - [address] [phone] (Fax)

## 2018-08-14 NOTE — Telephone Encounter (Signed)
LVM informing patient of MD response  

## 2018-08-14 NOTE — Telephone Encounter (Signed)
New Message:    *STAT* If patient is at the pharmacy, call can be transferred to refill team.   1. Which medications need to be refilled? (please list name of each medication and dose if known) losartan-hydrochlorothiazide (HYZAAR) 50-12.5 MG tablet  2. Which pharmacy/location (including street and city if local pharmacy) is medication to be sent to? [facility] - [place], [place] - [address]  3. Do they need a 30 day or 90 day supply? 30 Days

## 2018-08-14 NOTE — Addendum Note (Signed)
Addended by: Recardo EvangelistWILLIAMSON, Aidon Klemens L on: 08/14/2018 11:30 AM   Modules accepted: Orders

## 2018-09-01 ENCOUNTER — Other Ambulatory Visit: Payer: Self-pay | Admitting: *Deleted

## 2018-09-01 MED ORDER — HYDROCHLOROTHIAZIDE 12.5 MG PO CAPS: 12.5000 mg | ORAL_CAPSULE | Freq: Every day | ORAL | 3 refills | Status: DC

## 2018-09-01 MED ORDER — LOSARTAN POTASSIUM 50 MG PO TABS: 50.0000 mg | ORAL_TABLET | Freq: Every day | ORAL | 3 refills | Status: DC

## 2018-09-01 NOTE — Telephone Encounter (Signed)
Call and spoke with pt letting her know we received paper Rx stated Fonnie Jarvis is unavailable, as per pharmacist ok to spilt medication and send in Losartan and HCTZ, pt made aware medication is being send in separately, pt voice understanding.

## 2018-11-21 ENCOUNTER — Ambulatory Visit (INDEPENDENT_AMBULATORY_CARE_PROVIDER_SITE_OTHER): Payer: Medicare HMO | Admitting: Internal Medicine

## 2018-11-21 ENCOUNTER — Encounter: Payer: Self-pay | Admitting: Internal Medicine

## 2018-11-21 DIAGNOSIS — K5900 Constipation, unspecified: Secondary | ICD-10-CM

## 2018-11-21 NOTE — Progress Notes (Signed)
   Subjective:   Patient ID: Alyssa Lindsey, female    DOB: [birth year], 84 y.o.   MRN: [id number]  HPI The patient is an 84 YO female coming in for lower abdomen discomfort. Started yesterday. She does have a lot of changes in diet with the holidays. She is eating a lot of sugary foods which she normally does not eat. She denies outright pain but is not comfortable. She denies diarrhea or constipation or nausea or vomiting. She is having no pain now. Denies blood in stool. Overall discomfort level is 0/10. Overall it is improving and gone today. Denies GERD or reflux symptoms except rare.  Denies any urinary symptoms including burning, urgency, frequency.  Review of Systems  Constitutional: Negative.   HENT: Negative.   Eyes: Negative.   Respiratory: Negative for cough, chest tightness and shortness of breath.   Cardiovascular: Negative for chest pain, palpitations and leg swelling.  Gastrointestinal: Positive for abdominal pain. Negative for abdominal distention, anal bleeding, blood in stool, constipation, diarrhea, nausea, rectal pain and vomiting.  Genitourinary: Negative.   Musculoskeletal: Negative.   Skin: Negative.   Neurological: Negative.   Psychiatric/Behavioral: Negative.     Objective:  Physical Exam Constitutional:      Appearance: She is well-developed.  HENT:     Head: Normocephalic and atraumatic.  Neck:     Musculoskeletal: Normal range of motion.  Cardiovascular:     Rate and Rhythm: Normal rate and regular rhythm.  Pulmonary:     Effort: Pulmonary effort is normal. No respiratory distress.     Breath sounds: Normal breath sounds. No wheezing or rales.  Abdominal:     General: Bowel sounds are normal. There is no distension.     Palpations: Abdomen is soft.     Tenderness: There is no abdominal tenderness. There is no rebound.  Skin:    General: Skin is warm and dry.  Neurological:     Mental Status: She is alert and oriented to person, place, and time.     Coordination: Coordination normal.     Vitals:   11/21/18 0816  BP: 104/70  Pulse: (!) 48  Temp: 98.1 F (36.7 C)  TempSrc: Oral  SpO2: 98%  Weight: 159 lb (72.1 kg)  Height: 5\' 5"  (1.651 m)    Assessment & Plan:  Visit time 25 minutes: greater than 50% of that time was spent in face to face counseling and coordination of care with the patient: counseled about ibs, fiber content, sugar content and dairy products and the effect on the colon

## 2018-11-21 NOTE — Assessment & Plan Note (Signed)
We talked extensively about trying to add fiber to regulate her bowels and keeping food journal to help keep her regulated. She does some regular activity. Given information about high fiber foods. No testing required today.

## 2018-11-21 NOTE — Patient Instructions (Signed)

## 2018-12-28 ENCOUNTER — Other Ambulatory Visit: Payer: Self-pay | Admitting: Internal Medicine

## 2019-02-25 ENCOUNTER — Other Ambulatory Visit: Payer: Self-pay | Admitting: Cardiology

## 2019-02-25 DIAGNOSIS — I480 Paroxysmal atrial fibrillation: Secondary | ICD-10-CM

## 2019-02-25 NOTE — Telephone Encounter (Signed)
Xarelto refilled.

## 2019-03-05 ENCOUNTER — Other Ambulatory Visit: Payer: Self-pay | Admitting: Internal Medicine

## 2019-03-05 DIAGNOSIS — I480 Paroxysmal atrial fibrillation: Secondary | ICD-10-CM

## 2019-07-28 NOTE — Progress Notes (Signed)
Error

## 2019-07-30 ENCOUNTER — Encounter: Payer: Self-pay | Admitting: Cardiology

## 2019-07-30 ENCOUNTER — Other Ambulatory Visit: Payer: Self-pay

## 2019-07-30 ENCOUNTER — Ambulatory Visit: Payer: Medicare HMO | Admitting: Cardiology

## 2019-07-30 VITALS — BP 110/78 | HR 93 | Temp 97.3°F | Ht 65.0 in | Wt 165.0 lb

## 2019-07-30 DIAGNOSIS — I10 Essential (primary) hypertension: Secondary | ICD-10-CM | POA: Diagnosis not present

## 2019-07-30 DIAGNOSIS — I480 Paroxysmal atrial fibrillation: Secondary | ICD-10-CM

## 2019-07-30 NOTE — Progress Notes (Signed)
HPI The patient presents for evaluation of atrial fibrillation.     Since I last saw her she has done well.  She does not notice that she is in fibrillation today.   The patient denies any new symptoms such as chest discomfort, neck or arm discomfort. There has been no new shortness of breath, PND or orthopnea. There have been no reported palpitations, presyncope or syncope.    Allergies  Allergen Reactions  . Dicyclomine Hcl Other (See Comments)    Severe eye pain  . Lisinopril Cough  . Aspirin Other (See Comments)    Unknown reaction    Current Outpatient Medications  Medication Sig Dispense Refill  . amLODipine (NORVASC) 10 MG tablet Take 1 tablet (10 mg total) by mouth daily. 30 tablet 11  . cetirizine (ZYRTEC) 10 MG tablet TAKE ONE TABLET BY MOUTH ONCE DAILY 30 tablet 11  . cyclobenzaprine (FLEXERIL) 5 MG tablet TAKE 1 TABLET BY MOUTH TWICE DAILY AS NEEDED FOR MUSCLE SPASM 30 tablet 3  . diclofenac sodium (VOLTAREN) 1 % GEL APPLY TWO GRAMS THREE TIMES DAILY AS NEEDED 100 g 3  . famotidine (PEPCID) 20 MG tablet TAKE 1 TABLET BY MOUTH TWICE DAILY 60 tablet 2  . metoprolol tartrate (LOPRESSOR) 25 MG tablet TAKE 1/2 (ONE-HALF) TABLET BY MOUTH TWICE DAILY NEED  ANNUAL  VISIT  FOR  FURTHER  REFILLS 60 tablet 0  . mirtazapine (REMERON) 15 MG tablet TAKE 1 TABLET BY MOUTH AT BEDTIME 90 tablet 1  . traMADol (ULTRAM) 50 MG tablet TAKE 1 TABLET BY MOUTH EVERY 8 HOURS AS NEEDED 60 tablet 2  . XARELTO 15 MG TABS tablet TAKE 1 TABLET BY MOUTH ONCE DAILY WITH SUPPER 30 tablet 5  . hydrochlorothiazide (MICROZIDE) 12.5 MG capsule Take 1 capsule (12.5 mg total) by mouth daily. 90 capsule 3  . losartan (COZAAR) 50 MG tablet Take 1 tablet (50 mg total) by mouth daily. 90 tablet 3   No current facility-administered medications for this visit.     Past Medical History:  Diagnosis Date  . Arthritis   . Chronic neck pain   . Diverticulosis of colon (without mention of hemorrhage)   . Family  history of malignant neoplasm of gastrointestinal tract   . Gastritis   . Hiatal hernia   . HTN (hypertension)   . LBP (low back pain)   . Memory problem   . Spondylosis   . Type II or unspecified type diabetes mellitus without mention of complication, not stated as uncontrolled     Past Surgical History:  Procedure Laterality Date  . COLONOSCOPY  2010   NORMAL-- DUE 2020  . TUBAL LIGATION      ROS:  Decreased memory   Otherwise as stated in the HPI and negative for all other systems.  PHYSICAL EXAM BP 110/78 (BP Location: Right Arm, Patient Position: Sitting, Cuff Size: Large)   Pulse 93   Temp (!) 97.3 F (36.3 C)   Ht 5\' 5"  (1.651 m)   Wt 165 lb (74.8 kg)   BMI 27.46 kg/m   GENERAL:  Well appearing NECK:  No jugular venous distention, waveform within normal limits, carotid upstroke brisk and symmetric, no bruits, no thyromegaly LUNGS:  Clear to auscultation bilaterally CHEST:  Unremarkable HEART:  PMI not displaced or sustained,S1 and S2 within normal limits, no S3,  no clicks, no rubs, no murmurs ABD:  Flat, positive bowel sounds normal in frequency in pitch, no bruits, no rebound, no guarding,  no midline pulsatile mass, no hepatomegaly, no splenomegaly EXT:  2 plus pulses throughout, no edema, no cyanosis no clubbing   EKG: Atrial fibrillation, rate 93, axis within normal limits, intervals within normal limits, nonspecific T wave flattening.  07/30/2019   ASSESSMENT AND PLAN  ATRIAL FIB:  [name] has a CHA2DS2 - VASc score of 5 with a risk of stroke of 6.7% .   She does not know she is in this rhythm.  She tolerates anticoagulation.  No change in therapy.  She can get a pulse ox to report her heart rate so that I know she is well controlled.  Check a CBC event today.   HTN:  Her BP is well controlled.  No change in therapy.

## 2019-07-30 NOTE — Patient Instructions (Addendum)
Medication Instructions:  Your physician recommends that you continue on your current medications as directed. Please refer to the Current Medication list given to you today.  If you need a refill on your cardiac medications before your next appointment, please call your pharmacy.   Lab work: CBC and BMET If you have labs (blood work) drawn today and your tests are completely normal, you will receive your results only by: [place] (if you have MyChart) OR A paper copy in the mail If you have any lab test that is abnormal or we need to change your treatment, we will call you to review the results.  Testing/Procedures: NONE  Follow-Up: At [facility], you and your health needs are our priority.  As part of our continuing mission to provide you with exceptional heart care, we have created designated Provider Care Teams.  These Care Teams include your primary Cardiologist (physician) and Advanced Practice Providers (APPs -  Physician Assistants and Nurse Practitioners) who all work together to provide you with the care you need, when you need it. You will need a follow up appointment in 12 months.  Please call our office 2 months in advance to schedule this appointment.  You may see Minus Breeding, MD or one of the following Advanced Practice Providers on your designated Care Team:   Rosaria Ferries, PA-C Jory Sims, DNP, ANP  Any Other Special Instructions Will Be Listed Below (If Applicable). Please look into buying a pulse oximeter. They can be found online on [facility] for less than $25 or at stores [facility]) for less than $10.

## 2019-07-31 LAB — BASIC METABOLIC PANEL
BUN/Creatinine Ratio: 14 (ref 12–28)
BUN: 18 mg/dL (ref 8–27)
CO2: 22 mmol/L (ref 20–29)
Calcium: 9.3 mg/dL (ref 8.7–10.3)
Chloride: 104 mmol/L (ref 96–106)
Creatinine, Ser: 1.3 mg/dL — ABNORMAL HIGH (ref 0.57–1.00)
GFR calc Af Amer: 42 mL/min/{1.73_m2} — ABNORMAL LOW (ref >59)
GFR calc non Af Amer: 37 mL/min/{1.73_m2} — ABNORMAL LOW (ref >59)
Glucose: 133 mg/dL — ABNORMAL HIGH (ref 65–99)
Potassium: 4.8 mmol/L (ref 3.5–5.2)
Sodium: 142 mmol/L (ref 134–144)

## 2019-07-31 LAB — CBC
Hematocrit: 40.9 % (ref 34.0–46.6)
Hemoglobin: 13.6 g/dL (ref 11.1–15.9)
MCH: 27.5 pg (ref 26.6–33.0)
MCHC: 33.3 g/dL (ref 31.5–35.7)
MCV: 83 fL (ref 79–97)
RBC: 4.95 x10E6/uL (ref 3.77–5.28)
RDW: 15.6 % — ABNORMAL HIGH (ref 11.7–15.4)
WBC: 5.5 10*3/uL (ref 3.4–10.8)

## 2019-08-06 ENCOUNTER — Ambulatory Visit: Payer: Self-pay | Admitting: Internal Medicine

## 2019-08-06 NOTE — Telephone Encounter (Signed)
Pt is evasive historian. Daughter '[name]' also present.   Pt reports "Legs feel tight." Denies any swelling, no pain or tenderness, no warmth or redness, "Just feel tight." Also reports "Feels like something is moving at the bottom of my feet."  States "Veins in right leg are bluish." Also reports rectal burning "An little stain of bright red blood on tissue when I wipe."  Reports has had hemorroids in past. Attempted to reach practice, recording. Pt able to do virtual appt with daughter present. Care advise given; advised to go to ED if symptoms worsen.   CB# [phone]  Reason for Disposition . Nursing judgment or information in reference  Answer Assessment - Initial Assessment Questions 1. REASON FOR CALL: "What is your main concern right now?"     Legs feel "Tight" no swelling. "Feel something moving bottom of my feet" 2. ONSET: "When did the *No Answer* start?"     1 week ago 3. SEVERITY: "How bad is the *No Answer*?"     "Tightness" 4. FEVER: "Do you have a fever?"     no 5. OTHER SYMPTOMS: "Do you have any other new symptoms?"     Rectal burning 6. INTERVENTIONS AND RESPONSE: "What have you done so far to try to make this better? What medications have you used?"    nothing  Protocols used: NO GUIDELINE AVAILABLE-A-AH

## 2019-08-10 NOTE — Telephone Encounter (Signed)
Called patient and spoke to daughter. Asked how patient was feeling, patient stated that she was feeling better. Told daughter if patients symptoms return can call us back. Stated understanding

## 2019-08-12 ENCOUNTER — Other Ambulatory Visit: Payer: Self-pay | Admitting: Internal Medicine

## 2019-08-15 ENCOUNTER — Other Ambulatory Visit: Payer: Self-pay

## 2019-08-15 ENCOUNTER — Ambulatory Visit (INDEPENDENT_AMBULATORY_CARE_PROVIDER_SITE_OTHER): Payer: Medicare HMO | Admitting: Family Medicine

## 2019-08-15 DIAGNOSIS — R200 Anesthesia of skin: Secondary | ICD-10-CM

## 2019-08-15 DIAGNOSIS — I10 Essential (primary) hypertension: Secondary | ICD-10-CM

## 2019-08-15 DIAGNOSIS — R202 Paresthesia of skin: Secondary | ICD-10-CM | POA: Diagnosis not present

## 2019-08-15 DIAGNOSIS — R600 Localized edema: Secondary | ICD-10-CM

## 2019-08-15 MED ORDER — PRAMIPEXOLE DIHYDROCHLORIDE 0.125 MG PO TABS: 0.1250 mg | ORAL_TABLET | Freq: Two times a day (BID) | ORAL | 1 refills | Status: DC

## 2019-08-15 NOTE — Progress Notes (Signed)
Virtual Visit via phone Note  I connected with Alyssa Lindsey on 08/15/19 at  9:20 AM EDT by a phone enabled telemedicine application and verified that I am speaking with the correct person using two identifiers.  Location: Patient: home Provider: home   I discussed the limitations of evaluation and management by telemedicine and the availability of in person appointments. The patient expressed understanding and agreed to proceed. [name] was able to get patient set up on phone visit after attempting video visit failed   Subjective:    Patient ID: Alyssa Lindsey, female    DOB: 06-Aug-1929, 85 y.o.   MRN: [id number]  No chief complaint on file.   HPI Patient is in today for evaluation of increased tingling in her feet. She is accompanied by her daughter Zella Ball. Patient calls complaining of worsening tingling in a prickly sensation in the base of her feet over the last 2 weeks she has a longstanding history of some numbness but it is worsened.  She denies any falls or acute illness.  No fevers, malaise or other concerns.  She has had some intermittent swelling in her ankles but that is her baseline and it is no problem this morning yet the tingling persists the tingling bothers her both day and night but is somewhat worse at night.  She reports there may be a slight rash but her daughter is with her and cannot confirm.  Her daughter is legally blind so could not see red spots but there is no bumps or raised lesions or swelling that her daughter can identify. Denies CP/palp/SOB/HA/congestion/fevers/GI or GU c/o. Taking meds as prescribed  Past Medical History:  Diagnosis Date   Arthritis    Chronic neck pain    Diverticulosis of colon (without mention of hemorrhage)    Family history of malignant neoplasm of gastrointestinal tract    Gastritis    Hiatal hernia    HTN (hypertension)    LBP (low back pain)    Memory problem    Spondylosis    Type II or unspecified  type diabetes mellitus without mention of complication, not stated as uncontrolled     Past Surgical History:  Procedure Laterality Date   COLONOSCOPY  2010   NORMAL-- DUE 2020   TUBAL LIGATION      Family History  Problem Relation Age of Onset   Emphysema Brother    Stroke Father 32   Ovarian cancer Sister    Breast cancer Sister    Liver disease Brother    Colon cancer Daughter    Diabetes Daughter    Throat cancer Brother    Lung cancer Daughter     Social History   Socioeconomic History   Marital status: Widowed    Spouse name: Not on file   Number of children: 4   Years of education: Not on file   Highest education level: Not on file  Occupational History   Occupation: Retired Furniture conservator/restorer strain: Not on file   Food insecurity    Worry: Not on file    Inability: Not on Occupational hygienist needs    Medical: Not on file    Non-medical: Not on file  Tobacco Use   Smoking status: Never Smoker   Smokeless tobacco: Never Used  Substance and Sexual Activity   Alcohol use: No    Alcohol/week: 0.0 standard drinks   Drug use: No   Sexual activity: Not on file  Lifestyle   Physical activity    Days per week: Not on file    Minutes per session: Not on file   Stress: Not on file  Relationships   Social connections    Talks on phone: Not on file    Gets together: Not on file    Attends religious service: Not on file    Active member of club or organization: Not on file    Attends meetings of clubs or organizations: Not on file    Relationship status: Not on file   Intimate partner violence    Fear of current or ex partner: Not on file    Emotionally abused: Not on file    Physically abused: Not on file    Forced sexual activity: Not on file  Other Topics Concern   Not on file  Social History Narrative   Widow    4 children: 1 son '59; 3 dtrs '53, '62, '64; 9 grandchildren; great-grand    Lives with dtr at home, grandson at home      Daily Caffeine Use:  1 cup   Regular Exercise -  NO         See MAR in Epic for medications.   Allergies  Allergen Reactions   Dicyclomine Hcl Other (See Comments)    Severe eye pain   Lisinopril Cough   Aspirin Other (See Comments)    Unknown reaction    Review of Systems  Constitutional: Negative for fever and malaise/fatigue.  HENT: Negative for congestion.   Eyes: Negative for blurred vision.  Respiratory: Negative for shortness of breath.   Cardiovascular: Positive for leg swelling. Negative for chest pain and palpitations.  Gastrointestinal: Negative for abdominal pain, blood in stool and nausea.  Genitourinary: Negative for dysuria and frequency.  Musculoskeletal: Negative for falls.  Skin: Positive for rash. Negative for itching.  Neurological: Positive for tingling. Negative for dizziness, loss of consciousness and headaches.  Endo/Heme/Allergies: Negative for environmental allergies.  Psychiatric/Behavioral: Negative for depression. The patient is not nervous/anxious.        Objective:    Physical Exam unable to obtain via phone  There were no vitals taken for this visit. Wt Readings from Last 3 Encounters:  07/30/19 165 lb (74.8 kg)  11/21/18 159 lb (72.1 kg)  08/12/18 157 lb (71.2 kg)    Diabetic Foot Exam - Simple   No data filed     Lab Results  Component Value Date   WBC 5.5 07/30/2019   HGB 13.6 07/30/2019   HCT 40.9 07/30/2019   PLT CANCELED 07/30/2019   GLUCOSE 133 (H) 07/30/2019   CHOL 162 08/12/2018   TRIG 100.0 08/12/2018   HDL 49.10 08/12/2018   LDLCALC 93 08/12/2018   ALT 8 08/12/2018   AST 16 08/12/2018   NA 142 07/30/2019   K 4.8 07/30/2019   CL 104 07/30/2019   CREATININE 1.30 (H) 07/30/2019   BUN 18 07/30/2019   CO2 22 07/30/2019   TSH 3.96 08/12/2018   INR 0.98 03/04/2014   HGBA1C 6.3 08/12/2018   MICROALBUR 0.6 07/30/2014    Lab Results  Component Value Date   TSH  3.96 08/12/2018   Lab Results  Component Value Date   WBC 5.5 07/30/2019   HGB 13.6 07/30/2019   HCT 40.9 07/30/2019   MCV 83 07/30/2019   PLT CANCELED 07/30/2019   Lab Results  Component Value Date   NA 142 07/30/2019   K 4.8 07/30/2019   CO2 22  07/30/2019   GLUCOSE 133 (H) 07/30/2019   BUN 18 07/30/2019   CREATININE 1.30 (H) 07/30/2019   BILITOT 0.7 08/12/2018   ALKPHOS 73 08/12/2018   AST 16 08/12/2018   ALT 8 08/12/2018   PROT 7.5 08/12/2018   ALBUMIN 4.2 08/12/2018   CALCIUM 9.3 07/30/2019   GFR 45.71 (L) 08/12/2018   Lab Results  Component Value Date   CHOL 162 08/12/2018   Lab Results  Component Value Date   HDL 49.10 08/12/2018   Lab Results  Component Value Date   LDLCALC 93 08/12/2018   Lab Results  Component Value Date   TRIG 100.0 08/12/2018   Lab Results  Component Value Date   CHOLHDL 3 08/12/2018   Lab Results  Component Value Date   HGBA1C 6.3 08/12/2018       Assessment & Plan:   Problem List Items Addressed This Visit    Essential hypertension    Monitor and report any concerns.      Bilateral leg edema    Is mild and at baseline encouraged to elevate feet when possible      Numbness and tingling of both legs    Patient calls complaining of worsening tingling in a prickly sensation in the base of her feet over the last 2 weeks she has a longstanding history of some numbness but it is worsened.  She denies any falls or acute illness.  No fevers, malaise or other concerns.  She has had some intermittent swelling in her ankles but that is her baseline and it is no problem this morning yet the tingling persists the tingling bothers her both day and night but is somewhat worse at night.  She reports there may be a slight rash but her daughter is with her and cannot confirm.  Her daughter is legally blind so could not see red spots but there is no bumps or raised lesions or swelling that her daughter can identify.  We will treat what sounds  like restless leg symptoms with a low dose of Mirapex 0.125 mg twice daily and then have her make a follow-up appointment for roughly 2 weeks to evaluate efficacy.  They are warned that if she develops any worsening symptoms whether it be worsening swelling, pain, redness, fever or any concerns to call back and we will readdress.  In the meantime they also encouraged to get some Sarna lotion and a lotion with menthol such as blue emu or BenGay to use to rub her legs down twice daily to help with symptom control         I am having Adonis B. Mesenbrink start on pramipexole. I am also having her maintain her cetirizine, diclofenac sodium, mirtazapine, traMADol, amLODipine, losartan, hydrochlorothiazide, famotidine, Xarelto, metoprolol tartrate, and cyclobenzaprine.  Meds ordered this encounter  Medications   pramipexole (MIRAPEX) 0.125 MG tablet    Sig: Take 1 tablet (0.125 mg total) by mouth 2 (two) times daily.    Dispense:  60 tablet    Refill:  1     I discussed the assessment and treatment plan with the patient. The patient was provided an opportunity to ask questions and all were answered. The patient agreed with the plan and demonstrated an understanding of the instructions.   The patient was advised to call back or seek an in-person evaluation if the symptoms worsen or if the condition fails to improve as anticipated.  I provided 25 minutes of non-face-to-face time during this encounter.   Misty StanleyStacey  Charlett Blake, MD

## 2019-08-15 NOTE — Assessment & Plan Note (Signed)
Monitor and report any concerns 

## 2019-08-15 NOTE — Assessment & Plan Note (Signed)
Is mild and at baseline encouraged to elevate feet when possible

## 2019-08-15 NOTE — Assessment & Plan Note (Signed)
Patient calls complaining of worsening tingling in a prickly sensation in the base of her feet over the last 2 weeks she has a longstanding history of some numbness but it is worsened.  She denies any falls or acute illness.  No fevers, malaise or other concerns.  She has had some intermittent swelling in her ankles but that is her baseline and it is no problem this morning yet the tingling persists the tingling bothers her both day and night but is somewhat worse at night.  She reports there may be a slight rash but her daughter is with her and cannot confirm.  Her daughter is legally blind so could not see red spots but there is no bumps or raised lesions or swelling that her daughter can identify.  We will treat what sounds like restless leg symptoms with a low dose of Mirapex 0.125 mg twice daily and then have her make a follow-up appointment for roughly 2 weeks to evaluate efficacy.  They are warned that if she develops any worsening symptoms whether it be worsening swelling, pain, redness, fever or any concerns to call back and we will readdress.  In the meantime they also encouraged to get some Sarna lotion and a lotion with menthol such as blue emu or BenGay to use to rub her legs down twice daily to help with symptom control

## 2019-08-15 NOTE — Assessment & Plan Note (Deleted)
Encouraged to  no changes to meds. Encouraged heart healthy diet such as the DASH diet and exercise as tolerated.

## 2019-09-11 ENCOUNTER — Other Ambulatory Visit: Payer: Self-pay

## 2019-09-11 ENCOUNTER — Encounter: Payer: Self-pay | Admitting: Internal Medicine

## 2019-09-11 ENCOUNTER — Other Ambulatory Visit (INDEPENDENT_AMBULATORY_CARE_PROVIDER_SITE_OTHER): Payer: Medicare HMO

## 2019-09-11 ENCOUNTER — Ambulatory Visit (INDEPENDENT_AMBULATORY_CARE_PROVIDER_SITE_OTHER): Payer: Medicare HMO | Admitting: Internal Medicine

## 2019-09-11 VITALS — BP 130/84 | HR 75 | Temp 97.8°F | Ht 65.0 in | Wt 171.0 lb

## 2019-09-11 DIAGNOSIS — Z23 Encounter for immunization: Secondary | ICD-10-CM | POA: Diagnosis not present

## 2019-09-11 DIAGNOSIS — M545 Low back pain, unspecified: Secondary | ICD-10-CM

## 2019-09-11 DIAGNOSIS — I10 Essential (primary) hypertension: Secondary | ICD-10-CM

## 2019-09-11 DIAGNOSIS — Z0000 Encounter for general adult medical examination without abnormal findings: Secondary | ICD-10-CM | POA: Diagnosis not present

## 2019-09-11 DIAGNOSIS — E1142 Type 2 diabetes mellitus with diabetic polyneuropathy: Secondary | ICD-10-CM

## 2019-09-11 DIAGNOSIS — K219 Gastro-esophageal reflux disease without esophagitis: Secondary | ICD-10-CM | POA: Diagnosis not present

## 2019-09-11 DIAGNOSIS — I480 Paroxysmal atrial fibrillation: Secondary | ICD-10-CM | POA: Diagnosis not present

## 2019-09-11 LAB — CBC
HCT: 40.7 % (ref 36.0–46.0)
Hemoglobin: 13.2 g/dL (ref 12.0–15.0)
MCHC: 32.4 g/dL (ref 30.0–36.0)
MCV: 82.1 fl (ref 78.0–100.0)
Platelets: 163 10*3/uL (ref 150.0–400.0)
RBC: 4.95 Mil/uL (ref 3.87–5.11)
RDW: 16.9 % — ABNORMAL HIGH (ref 11.5–15.5)
WBC: 5.2 10*3/uL (ref 4.0–10.5)

## 2019-09-11 LAB — COMPREHENSIVE METABOLIC PANEL
ALT: 13 U/L (ref 0–35)
AST: 15 U/L (ref 0–37)
Albumin: 4.2 g/dL (ref 3.5–5.2)
Alkaline Phosphatase: 88 U/L (ref 39–117)
BUN: 20 mg/dL (ref 6–23)
CO2: 28 mEq/L (ref 19–32)
Calcium: 9.2 mg/dL (ref 8.4–10.5)
Chloride: 106 mEq/L (ref 96–112)
Creatinine, Ser: 1.38 mg/dL — ABNORMAL HIGH (ref 0.40–1.20)
GFR: 43.62 mL/min — ABNORMAL LOW (ref >60.00)
Glucose, Bld: 94 mg/dL (ref 70–99)
Potassium: 3.9 mEq/L (ref 3.5–5.1)
Sodium: 142 mEq/L (ref 135–145)
Total Bilirubin: 0.5 mg/dL (ref 0.2–1.2)
Total Protein: 7.5 g/dL (ref 6.0–8.3)

## 2019-09-11 LAB — LIPID PANEL
Cholesterol: 144 mg/dL (ref 0–200)
HDL: 40.7 mg/dL (ref >39.00)
LDL Cholesterol: 79 mg/dL (ref 0–99)
NonHDL: 103.72
Total CHOL/HDL Ratio: 4
Triglycerides: 125 mg/dL (ref 0.0–149.0)
VLDL: 25 mg/dL (ref 0.0–40.0)

## 2019-09-11 LAB — MICROALBUMIN / CREATININE URINE RATIO
Creatinine,U: 106.2 mg/dL
Microalb Creat Ratio: 1 mg/g (ref 0.0–30.0)
Microalb, Ur: 1.1 mg/dL (ref 0.0–1.9)

## 2019-09-11 LAB — HEMOGLOBIN A1C: Hgb A1c MFr Bld: 7.1 % — ABNORMAL HIGH (ref 4.6–6.5)

## 2019-09-11 NOTE — Assessment & Plan Note (Signed)
Advised to stop ibuprofen given her concurrent xarelto. Advised to use the voltaren gel or tylenol only. No indication for imaging as it is resolving.

## 2019-09-11 NOTE — Patient Instructions (Signed)
Health Maintenance, Female Adopting a healthy lifestyle and getting preventive care are important in promoting health and wellness. Ask your health care provider about:  The right schedule for you to have regular tests and exams.  Things you can do on your own to prevent diseases and keep yourself healthy. What should I know about diet, weight, and exercise? Eat a healthy diet   Eat a diet that includes plenty of vegetables, fruits, low-fat dairy products, and lean protein.  Do not eat a lot of foods that are high in solid fats, added sugars, or sodium. Maintain a healthy weight Body mass index (BMI) is used to identify weight problems. It estimates body fat based on height and weight. Your health care provider can help determine your BMI and help you achieve or maintain a healthy weight. Get regular exercise Get regular exercise. This is one of the most important things you can do for your health. Most adults should:  Exercise for at least 150 minutes each week. The exercise should increase your heart rate and make you sweat (moderate-intensity exercise).  Do strengthening exercises at least twice a week. This is in addition to the moderate-intensity exercise.  Spend less time sitting. Even light physical activity can be beneficial. Watch cholesterol and blood lipids Have your blood tested for lipids and cholesterol at 83 years of age, then have this test every 5 years. Have your cholesterol levels checked more often if:  Your lipid or cholesterol levels are high.  You are older than 83 years of age.  You are at high risk for heart disease. What should I know about cancer screening? Depending on your health history and family history, you may need to have cancer screening at various ages. This may include screening for:  Breast cancer.  Cervical cancer.  Colorectal cancer.  Skin cancer.  Lung cancer. What should I know about heart disease, diabetes, and high blood  pressure? Blood pressure and heart disease  High blood pressure causes heart disease and increases the risk of stroke. This is more likely to develop in people who have high blood pressure readings, are of African descent, or are overweight.  Have your blood pressure checked: ? Every 3-5 years if you are 18-39 years of age. ? Every year if you are 40 years old or older. Diabetes Have regular diabetes screenings. This checks your fasting blood sugar level. Have the screening done:  Once every three years after age 40 if you are at a normal weight and have a low risk for diabetes.  More often and at a younger age if you are overweight or have a high risk for diabetes. What should I know about preventing infection? Hepatitis B If you have a higher risk for hepatitis B, you should be screened for this virus. Talk with your health care provider to find out if you are at risk for hepatitis B infection. Hepatitis C Testing is recommended for:  Everyone born from 1945 through 1965.  Anyone with known risk factors for hepatitis C. Sexually transmitted infections (STIs)  Get screened for STIs, including gonorrhea and chlamydia, if: ? You are sexually active and are younger than 83 years of age. ? You are older than 83 years of age and your health care provider tells you that you are at risk for this type of infection. ? Your sexual activity has changed since you were last screened, and you are at increased risk for chlamydia or gonorrhea. Ask your health care provider if   you are at risk.  Ask your health care provider about whether you are at high risk for HIV. Your health care provider may recommend a prescription medicine to help prevent HIV infection. If you choose to take medicine to prevent HIV, you should first get tested for HIV. You should then be tested every 3 months for as long as you are taking the medicine. Pregnancy  If you are about to stop having your period (premenopausal) and  you may become pregnant, seek counseling before you get pregnant.  Take 400 to 800 micrograms (mcg) of folic acid every day if you become pregnant.  Ask for birth control (contraception) if you want to prevent pregnancy. Osteoporosis and menopause Osteoporosis is a disease in which the bones lose minerals and strength with aging. This can result in bone fractures. If you are 65 years old or older, or if you are at risk for osteoporosis and fractures, ask your health care provider if you should:  Be screened for bone loss.  Take a calcium or vitamin D supplement to lower your risk of fractures.  Be given hormone replacement therapy (HRT) to treat symptoms of menopause. Follow these instructions at home: Lifestyle  Do not use any products that contain nicotine or tobacco, such as cigarettes, e-cigarettes, and chewing tobacco. If you need help quitting, ask your health care provider.  Do not use street drugs.  Do not share needles.  Ask your health care provider for help if you need support or information about quitting drugs. Alcohol use  Do not drink alcohol if: ? Your health care provider tells you not to drink. ? You are pregnant, may be pregnant, or are planning to become pregnant.  If you drink alcohol: ? Limit how much you use to 0-1 drink a day. ? Limit intake if you are breastfeeding.  Be aware of how much alcohol is in your drink. In the [place] one drink equals one 12 oz bottle of beer (355 mL), one 5 oz glass of wine (148 mL), or one 1 oz glass of hard liquor (44 mL). General instructions  Schedule regular health, dental, and eye exams.  Stay current with your vaccines.  Tell your health care provider if: ? You often feel depressed. ? You have ever been abused or do not feel safe at home. Summary  Adopting a healthy lifestyle and getting preventive care are important in promoting health and wellness.  Follow your health care provider's instructions about healthy  diet, exercising, and getting tested or screened for diseases.  Follow your health care provider's instructions on monitoring your cholesterol and blood pressure. This information is not intended to replace advice given to you by your health care provider. Make sure you discuss any questions you have with your health care provider. Document Released: 05/28/2011 Document Revised: 11/05/2018 Document Reviewed: 11/05/2018 Elsevier Patient Education  2020 [employer]  

## 2019-09-11 NOTE — Assessment & Plan Note (Signed)
Flu shot given. Pneumonia complete. Shingrix counseled declines. Tetanus due 2026. Colonoscopy aged out. Mammogram aged out, pap smear aged out and dexa due declines. Counseled about sun safety and mole surveillance. Counseled about the dangers of distracted driving. Given 10 year screening recommendations.

## 2019-09-11 NOTE — Assessment & Plan Note (Signed)
Foot exam done, diet controlled, on ARB. Checking HgA1c and microalbumin to creatinine ratio and lipid panel and adjust as needed.

## 2019-09-11 NOTE — Assessment & Plan Note (Signed)
BP at goal on losartan/hctz and metoprolol and amlodipine. Checking CMP and adjust as needed.

## 2019-09-11 NOTE — Assessment & Plan Note (Signed)
Stable on pepcid only.

## 2019-09-11 NOTE — Progress Notes (Signed)
Subjective:   Patient ID: Alyssa Lindsey, female    DOB: February 23, 1929, 85 y.o.   MRN: [id number]  HPI Here for medicare wellness and physical, with new complaints. Please see A/P for status and treatment of chronic medical problems.   HPI #2: Here for back pain (started about 1 week or so ago, in the middle of the back on the left side, denies overuse or injury, denies known trigger, has been taking ibuprofen which seems to help the pain some, was 8/10 but now dull ache 3/10, overall improving). Due to pandemic she requests to have this done at her annual exam without wanting to come back a separate day for any of these services.   Diet: DM since diabetic Physical activity: sedentary, walks some Depression/mood screen: negative Hearing: intact to whispered voice Visual acuity: grossly normal, performs annual eye exam  ADLs: capable Fall risk: none Home safety: good Cognitive evaluation: intact to orientation, naming, recall and repetition EOL planning: adv directives discussed    Office Visit from 09/11/2019 in [facility]  PHQ-2 Total Score  0      I have personally reviewed and have noted 1. The patient's medical and social history - reviewed today no changes 2. Their use of alcohol, tobacco or illicit drugs 3. Their current medications and supplements 4. The patient's functional ability including ADL's, fall risks, home safety risks and hearing or visual impairment. 5. Diet and physical activities 6. Evidence for depression or mood disorders 7. Care team reviewed and updated  Patient Care Team: Hoyt Koch, MD as PCP - General (Internal Medicine) Past Medical History:  Diagnosis Date  . Arthritis   . Chronic neck pain   . Diverticulosis of colon (without mention of hemorrhage)   . Family history of malignant neoplasm of gastrointestinal tract   . Gastritis   . Hiatal hernia   . HTN (hypertension)   . LBP (low back pain)   . Memory  problem   . Spondylosis   . Type II or unspecified type diabetes mellitus without mention of complication, not stated as uncontrolled    Past Surgical History:  Procedure Laterality Date  . COLONOSCOPY  2010   NORMAL-- DUE 2020  . TUBAL LIGATION     Family History  Problem Relation Age of Onset  . Emphysema Brother   . Stroke Father 51  . Ovarian cancer Sister   . Breast cancer Sister   . Liver disease Brother   . Colon cancer Daughter   . Diabetes Daughter   . Throat cancer Brother   . Lung cancer Daughter     Review of Systems  Constitutional: Negative.   HENT: Negative.   Eyes: Negative.   Respiratory: Positive for shortness of breath. Negative for cough and chest tightness.        Stable, chronic  Cardiovascular: Negative for chest pain, palpitations and leg swelling.  Gastrointestinal: Negative for abdominal distention, abdominal pain, constipation, diarrhea, nausea and vomiting.  Genitourinary: Negative for dysuria and frequency.  Musculoskeletal: Positive for back pain.  Skin: Negative.   Neurological: Negative.   Psychiatric/Behavioral: Negative.     Objective:  Physical Exam Constitutional:      Appearance: She is well-developed.  HENT:     Head: Normocephalic and atraumatic.  Neck:     Musculoskeletal: Normal range of motion.  Cardiovascular:     Rate and Rhythm: Normal rate. Rhythm irregular.  Pulmonary:     Effort: Pulmonary effort is normal.  No respiratory distress.     Breath sounds: Normal breath sounds. No wheezing or rales.  Abdominal:     General: Bowel sounds are normal. There is no distension.     Palpations: Abdomen is soft.     Tenderness: There is no abdominal tenderness. There is no rebound.  Musculoskeletal:        General: Tenderness present.     Comments: Mild tenderness left paraspinal low thoracic  Skin:    General: Skin is warm and dry.     Comments: Foot exam done  Neurological:     Mental Status: She is alert and oriented  to person, place, and time.     Coordination: Coordination normal.     Vitals:   09/11/19 0800  BP: 130/84  Pulse: 75  Temp: 97.8 F (36.6 C)  TempSrc: Oral  SpO2: 98%  Weight: 171 lb (77.6 kg)  Height: 5\' 5"  (1.651 m)    Assessment & Plan:  Flu shot given at visit

## 2019-09-11 NOTE — Assessment & Plan Note (Signed)
Taking xarelto for anticoagulation and HR at goal, in A fib today. On metoprolol for rate control.

## 2019-09-15 ENCOUNTER — Other Ambulatory Visit: Payer: Self-pay | Admitting: Cardiology

## 2019-09-15 DIAGNOSIS — I480 Paroxysmal atrial fibrillation: Secondary | ICD-10-CM

## 2019-09-15 NOTE — Telephone Encounter (Signed)
Rx request sent to pharmacy.  

## 2019-09-18 ENCOUNTER — Other Ambulatory Visit: Payer: Self-pay

## 2019-09-18 MED ORDER — FAMOTIDINE 20 MG PO TABS: 20.0000 mg | ORAL_TABLET | Freq: Two times a day (BID) | ORAL | 2 refills | Status: AC

## 2019-10-02 ENCOUNTER — Other Ambulatory Visit: Payer: Self-pay | Admitting: Internal Medicine

## 2019-10-02 DIAGNOSIS — I480 Paroxysmal atrial fibrillation: Secondary | ICD-10-CM

## 2019-10-06 ENCOUNTER — Other Ambulatory Visit: Payer: Self-pay

## 2019-10-06 ENCOUNTER — Encounter: Payer: Self-pay | Admitting: Internal Medicine

## 2019-10-06 ENCOUNTER — Ambulatory Visit (INDEPENDENT_AMBULATORY_CARE_PROVIDER_SITE_OTHER): Payer: Medicare HMO | Admitting: Internal Medicine

## 2019-10-06 DIAGNOSIS — N644 Mastodynia: Secondary | ICD-10-CM | POA: Insufficient documentation

## 2019-10-06 MED ORDER — NYSTATIN-TRIAMCINOLONE 100000-0.1 UNIT/GM-% EX OINT: 1.0000 "application " | TOPICAL_OINTMENT | Freq: Two times a day (BID) | CUTANEOUS | 0 refills | Status: DC

## 2019-10-06 MED ORDER — VALACYCLOVIR HCL 1 G PO TABS: 1000.0000 mg | ORAL_TABLET | Freq: Three times a day (TID) | ORAL | 0 refills | Status: DC

## 2019-10-06 NOTE — Progress Notes (Signed)
Virtual Visit via audio Note  I connected with Alyssa Lindsey on 10/06/19 at  1:40 PM EST by an audio-only enabled telemedicine application and verified that I am speaking with the correct person using two identifiers.  The patient and the provider were at separate locations throughout the entire encounter.   I discussed the limitations of evaluation and management by telemedicine and the availability of in person appointments. The patient expressed understanding and agreed to proceed. The patient and the provider were the only parties present for the visit unless noted in HPI below.  History of Present Illness: The patient is a 85 y.o. female with visit for left breast rash with red rash and dots. Started about 1-2 weeks ago. Has some pain in the left breast. Denies spreading. Overall it is hurting. Has tried tylenol for the pain but this is not helping at all. She denies itching. Denies lumps in the breast. Denies nipple discharge. Last mammogram 2016 and is aged out of routine screening. No prior shingles vaccine.  Observations/Objective: Voice strong, no coughing or dyspnea during visit, A and O times 3  Assessment and Plan: See problem oriented charting  Follow Up Instructions: rx valtrex and nystatin/triamcinolone ointment.   Visit time 12 minutes: that time was spent in non-face to face counseling and coordination of care with the patient: counseled about as above  I discussed the assessment and treatment plan with the patient. The patient was provided an opportunity to ask questions and all were answered. The patient agreed with the plan and demonstrated an understanding of the instructions.   The patient was advised to call back or seek an in-person evaluation if the symptoms worsen or if the condition fails to improve as anticipated.  Hoyt Koch, MD

## 2019-10-06 NOTE — Assessment & Plan Note (Signed)
Very difficult assessment over telephone. Rx to cover shingles with valtrex and fungal skin infection with nystatin/triamcinolone ointment.

## 2019-10-07 ENCOUNTER — Encounter: Payer: Self-pay | Admitting: Internal Medicine

## 2019-10-07 ENCOUNTER — Ambulatory Visit (INDEPENDENT_AMBULATORY_CARE_PROVIDER_SITE_OTHER): Payer: Medicare HMO | Admitting: Internal Medicine

## 2019-10-07 VITALS — BP 156/82 | HR 50 | Temp 98.7°F | Resp 16 | Ht 65.0 in | Wt 162.0 lb

## 2019-10-07 DIAGNOSIS — B029 Zoster without complications: Secondary | ICD-10-CM | POA: Diagnosis not present

## 2019-10-07 MED ORDER — GABAPENTIN 100 MG PO CAPS: 100.0000 mg | ORAL_CAPSULE | Freq: Two times a day (BID) | ORAL | 3 refills | Status: DC

## 2019-10-07 NOTE — Assessment & Plan Note (Signed)
Shingles Advised to pick up valtrex that was sent yesterday and start taking Try lidocaine gel/patch otc Cool compresses Will try low dose gabapentin - start with 100 mg daily - then increase to BID.  Discussed possible side effects and that her daughter needs to watch her closely when she starts this medication = advised to call with any concerns Already on tramadol - continue

## 2019-10-07 NOTE — Patient Instructions (Addendum)
Get lidocaine gel or patches at the pharmacy for the pain.    Pick up valtrex at [facility].    Try gabapentin 100 mg at night,  If tolerated increase to 2 times a day.       Shingles  Shingles, which is also known as herpes zoster, is an infection that causes a painful skin rash and fluid-filled blisters. It is caused by a virus. Shingles only develops in people who:  Have had chickenpox.  Have been given a medicine to protect against chickenpox (have been vaccinated). Shingles is rare in this group. What are the causes? Shingles is caused by varicella-zoster virus (VZV). This is the same virus that causes chickenpox. After a person is exposed to VZV, the virus stays in the body in an inactive (dormant) state. Shingles develops if the virus is reactivated. This can happen many years after the first (initial) exposure to VZV. It is not known what causes this virus to be reactivated. What increases the risk? People who have had chickenpox or received the chickenpox vaccine are at risk for shingles. Shingles infection is more common in people who:  Are older than age 68.  Have a weakened disease-fighting system (immune system), such as people with: ? HIV. ? AIDS. ? Cancer.  Are taking medicines that weaken the immune system, such as transplant medicines.  Are experiencing a lot of stress. What are the signs or symptoms? Early symptoms of this condition include itching, tingling, and pain in an area on your skin. Pain may be described as burning, stabbing, or throbbing. A few days or weeks after early symptoms start, a painful red rash appears. The rash is usually on one side of the body and has a band-like or belt-like pattern. The rash eventually turns into fluid-filled blisters that break open, change into scabs, and dry up in about 2-3 weeks. At any time during the infection, you may also develop:  A fever.  Chills.  A headache.  An upset stomach. How is this  diagnosed? This condition is diagnosed with a skin exam. Skin or fluid samples may be taken from the blisters before a diagnosis is made. These samples are examined under a microscope or sent to a lab for testing. How is this treated? The rash may last for several weeks. There is not a specific cure for this condition. Your health care provider will probably prescribe medicines to help you manage pain, recover more quickly, and avoid long-term problems. Medicines may include:  Antiviral drugs.  Anti-inflammatory drugs.  Pain medicines.  Anti-itching medicines (antihistamines). If the area involved is on your face, you may be referred to a specialist, such as an eye doctor (ophthalmologist) or an ear, nose, and throat (ENT) doctor (otolaryngologist) to help you avoid eye problems, chronic pain, or disability. Follow these instructions at home: Medicines  Take over-the-counter and prescription medicines only as told by your health care provider.  Apply an anti-itch cream or numbing cream to the affected area as told by your health care provider. Relieving itching and discomfort   Apply cold, wet cloths (cold compresses) to the area of the rash or blisters as told by your health care provider.  Cool baths can be soothing. Try adding baking soda or dry oatmeal to the water to reduce itching. Do not bathe in hot water. Blister and rash care  Keep your rash covered with a loose bandage (dressing). Wear loose-fitting clothing to help ease the pain of material rubbing against the rash.  Keep your rash and blisters clean by washing the area with mild soap and cool water as told by your health care provider.  Check your rash every day for signs of infection. Check for: ? More redness, swelling, or pain. ? Fluid or blood. ? Warmth. ? Pus or a bad smell.  Do not scratch your rash or pick at your blisters. To help avoid scratching: ? Keep your fingernails clean and cut short. ? Wear gloves  or mittens while you sleep, if scratching is a problem. General instructions  Rest as told by your health care provider.  Keep all follow-up visits as told by your health care provider. This is important.  Wash your hands often with soap and water. If soap and water are not available, use hand sanitizer. Doing this lowers your chance of getting a bacterial skin infection.  Before your blisters change into scabs, your shingles infection can cause chickenpox in people who have never had it or have never been vaccinated against it. To prevent this from happening, avoid contact with other people, especially: ? Babies. ? Pregnant women. ? Children who have eczema. ? Elderly people who have transplants. ? People who have chronic illnesses, such as cancer or AIDS. Contact a health care provider if:  Your pain is not relieved with prescribed medicines.  Your pain does not get better after the rash heals.  You have signs of infection in the rash area, such as: ? More redness, swelling, or pain around the rash. ? Fluid or blood coming from the rash. ? The rash area feeling warm to the touch. ? Pus or a bad smell coming from the rash. Get help right away if:  The rash is on your face or nose.  You have facial pain, pain around your eye area, or loss of feeling on one side of your face.  You have difficulty seeing.  You have ear pain or have ringing in your ear.  You have a loss of taste.  Your condition gets worse. Summary  Shingles, which is also known as herpes zoster, is an infection that causes a painful skin rash and fluid-filled blisters.  This condition is diagnosed with a skin exam. Skin or fluid samples may be taken from the blisters and examined before the diagnosis is made.  Keep your rash covered with a loose bandage (dressing). Wear loose-fitting clothing to help ease the pain of material rubbing against the rash.  Before your blisters change into scabs, your shingles  infection can cause chickenpox in people who have never had it or have never been vaccinated against it. This information is not intended to replace advice given to you by your health care provider. Make sure you discuss any questions you have with your health care provider. Document Released: 11/12/2005 Document Revised: 03/06/2019 Document Reviewed: 07/17/2017 Elsevier Patient Education  2020 [employer].

## 2019-10-07 NOTE — Progress Notes (Signed)
Subjective:    Patient ID: Alyssa Lindsey, female    DOB: 1929-09-28, 85 y.o.   MRN: [id number]  HPI The patient is here for an acute visit. She is here with her daughter.    Rash around breasts:  Rash started about one week ago.  The rash itches and is very painful.  She did do a virtual visit yesterday, but it was difficult to make a confident diagnosis virtually so shingles and a yeast infection were considered.  A prescription for valtrex and nystatin-triamcinolone cream was sent to her pharmacy by her PCP.  The prescription were not picked up because she had some of the same cream at home.  There has been no improvement.         Medications and allergies reviewed with patient and updated if appropriate.  Patient Active Problem List   Diagnosis Date Noted  . Breast pain, left 10/06/2019  . Constipation 11/18/2017  . Adjustment disorder with mixed anxiety and depressed mood 07/06/2016  . Low back pain 05/09/2016  . Numbness and tingling of both legs 04/25/2016  . Bilateral leg edema 04/11/2016  . Routine general medical examination at a health care facility 12/26/2015  . GERD (gastroesophageal reflux disease) 03/02/2015  . Paroxysmal A-fib (HCC) 03/04/2014  . Right knee pain 03/04/2014  . Chronic LLQ pain 02/07/2012  . B12 deficiency 03/06/2010  . 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 THYROID NODULE 03/03/2008  . BREAST MASS 03/03/2008  . Diabetes type 2, controlled (HCC) 06/21/2007  . Essential hypertension 06/21/2007    Current Outpatient Medications on File Prior to Visit  Medication Sig Dispense Refill  . amLODipine (NORVASC) 10 MG tablet Take 1 tablet (10 mg total) by mouth daily. 60 tablet 0  . cetirizine (ZYRTEC) 10 MG tablet TAKE ONE TABLET BY MOUTH ONCE DAILY 30 tablet 11  . cyclobenzaprine (FLEXERIL) 5 MG tablet TAKE 1 TABLET BY MOUTH TWICE DAILY AS NEEDED FOR MUSCLE SPASM 30 tablet 0  . diclofenac sodium (VOLTAREN) 1 % GEL APPLY TWO GRAMS THREE  TIMES DAILY AS NEEDED 100 g 3  . famotidine (PEPCID) 20 MG tablet Take 1 tablet (20 mg total) by mouth 2 (two) times daily. 60 tablet 2  . metoprolol tartrate (LOPRESSOR) 25 MG tablet TAKE 1/2 (ONE-HALF) TABLET BY MOUTH TWICE DAILY 90 tablet 1  . mirtazapine (REMERON) 15 MG tablet TAKE 1 TABLET BY MOUTH AT BEDTIME 90 tablet 1  . nystatin-triamcinolone ointment (MYCOLOG) Apply 1 application topically 2 (two) times daily. 100 g 0  . pramipexole (MIRAPEX) 0.125 MG tablet Take 1 tablet (0.125 mg total) by mouth 2 (two) times daily. 60 tablet 1  . traMADol (ULTRAM) 50 MG tablet TAKE 1 TABLET BY MOUTH EVERY 8 HOURS AS NEEDED 60 tablet 2  . valACYclovir (VALTREX) 1000 MG tablet Take 1 tablet (1,000 mg total) by mouth 3 (three) times daily. 21 tablet 0  . XARELTO 15 MG TABS tablet TAKE 1 TABLET BY MOUTH ONCE DAILY WITH SUPPER 30 tablet 5  . hydrochlorothiazide (MICROZIDE) 12.5 MG capsule Take 1 capsule (12.5 mg total) by mouth daily. 90 capsule 3  . losartan (COZAAR) 50 MG tablet Take 1 tablet (50 mg total) by mouth daily. 90 tablet 3   No current facility-administered medications on file prior to visit.     Past Medical History:  Diagnosis Date  . Arthritis   . Chronic neck pain   . Diverticulosis of colon (without mention of hemorrhage)   .  Family history of malignant neoplasm of gastrointestinal tract   . Gastritis   . Hiatal hernia   . HTN (hypertension)   . LBP (low back pain)   . Memory problem   . Spondylosis   . Type II or unspecified type diabetes mellitus without mention of complication, not stated as uncontrolled     Past Surgical History:  Procedure Laterality Date  . COLONOSCOPY  2010   NORMAL-- DUE 2020  . TUBAL LIGATION      Social History   Socioeconomic History  . Marital status: Widowed    Spouse name: Not on file  . Number of children: 4  . Years of education: Not on file  . Highest education level: Not on file  Occupational History  . Occupation: Retired  Diplomatic Services operational officerhousekeeper  Social Needs  . Financial resource strain: Not on file  . Food insecurity    Worry: Not on file    Inability: Not on file  . Transportation needs    Medical: Not on file    Non-medical: Not on file  Tobacco Use  . Smoking status: Never Smoker  . Smokeless tobacco: Never Used  Substance and Sexual Activity  . Alcohol use: No    Alcohol/week: 0.0 standard drinks  . Drug use: No  . Sexual activity: Not on file  Lifestyle  . Physical activity    Days per week: Not on file    Minutes per session: Not on file  . Stress: Not on file  Relationships  . Social Musicianconnections    Talks on phone: Not on file    Gets together: Not on file    Attends religious service: Not on file    Active member of club or organization: Not on file    Attends meetings of clubs or organizations: Not on file    Relationship status: Not on file  Other Topics Concern  . Not on file  Social History Narrative   Widow    4 children: 1 son '59; 3 dtrs '53, '62, '64; 9 grandchildren; great-grand   Lives with dtr at home, grandson at home      Daily Caffeine Use:  1 cup   Regular Exercise -  NO          Family History  Problem Relation Age of Onset  . Emphysema Brother   . Stroke Father [id number]  . Ovarian cancer Sister   . Breast cancer Sister   . Liver disease Brother   . Colon cancer Daughter   . Diabetes Daughter   . Throat cancer Brother   . Lung cancer Daughter     Review of Systems  Constitutional: Positive for fatigue. Negative for chills and fever.  Skin: Positive for color change and rash.       Objective:   Vitals:   10/07/19 1556  BP: (!) 156/82  Pulse: (!) 50  Resp: 16  Temp: 98.7 F (37.1 C)  SpO2: 98%   BP Readings from Last 3 Encounters:  10/07/19 (!) 156/82  09/11/19 130/84  07/30/19 110/78   Wt Readings from Last 3 Encounters:  10/07/19 162 lb (73.5 kg)  09/11/19 171 lb (77.6 kg)  07/30/19 165 lb (74.8 kg)   Body mass index is 26.96 kg/m.   Physical  Exam Constitutional:      General: She is not in acute distress.    Appearance: She is not ill-appearing.  HENT:     Head: Normocephalic and atraumatic.  Skin:  General: Skin is warm and dry.     Findings: Rash (several blisters with an erythema base that extends from her center chest around her left breast to her middle back.  no open wounds, no discharge) present.  Neurological:     Mental Status: She is alert.            Assessment & Plan:    See Problem List for Assessment and Plan of chronic medical problems.

## 2019-10-13 ENCOUNTER — Telehealth: Payer: Self-pay | Admitting: Internal Medicine

## 2019-10-13 ENCOUNTER — Telehealth: Payer: Self-pay | Admitting: *Deleted

## 2019-10-13 NOTE — Telephone Encounter (Signed)
Also,  Received fax from [facility] stating nystatin- triamcinolone ointment PCP rx'd is over $300 . They are requesting two separate rxs for pt. Please advise.

## 2019-10-13 NOTE — Telephone Encounter (Signed)
Duplicate. See 10/13/19 tele encounter.

## 2019-10-13 NOTE — Telephone Encounter (Signed)
Received fax from [facility] stating nystatin- triamcinolone ointment is over $300. They are requesting two separate rxs for pt. Please advise.

## 2019-10-13 NOTE — Telephone Encounter (Signed)
Pt's daughter stated pt is still having pain with Shingles and gabapentin is not helping. She would like to know if something stronger can be sent in to her pharmacy. Please advise.

## 2019-10-14 MED ORDER — TRIAMCINOLONE ACETONIDE 0.1 % EX CREA: 1.0000 "application " | TOPICAL_CREAM | Freq: Two times a day (BID) | CUTANEOUS | 0 refills | Status: DC

## 2019-10-14 MED ORDER — KETOCONAZOLE 2 % EX CREA: 1.0000 "application " | TOPICAL_CREAM | Freq: Two times a day (BID) | CUTANEOUS | 0 refills | Status: DC | PRN

## 2019-10-14 NOTE — Telephone Encounter (Signed)
I doubt the nystatin would be helpful for shingles  Ok for triam cr prn - done erx

## 2019-10-14 NOTE — Telephone Encounter (Signed)
Per Dr. Jenny Reichmann, ketoconazole also sent in. See meds. Pt informed.

## 2019-10-16 MED ORDER — VALACYCLOVIR HCL 1 G PO TABS: 1000.0000 mg | ORAL_TABLET | Freq: Three times a day (TID) | ORAL | 0 refills | Status: DC

## 2019-10-16 NOTE — Telephone Encounter (Signed)
Copied from [place] [phone]. Topic: General - Other >> Oct 16, 2019  1:33 PM Carolyn Stare wrote: Pt asking for refill on valACYclovir (VALTREX) 1000 MG tablet, daughter said blisters are bursting and burning and that is the reason for the refill

## 2019-10-19 ENCOUNTER — Telehealth: Payer: Self-pay | Admitting: Internal Medicine

## 2019-10-19 ENCOUNTER — Other Ambulatory Visit: Payer: Self-pay

## 2019-10-19 ENCOUNTER — Encounter: Payer: Self-pay | Admitting: Internal Medicine

## 2019-10-19 ENCOUNTER — Ambulatory Visit (INDEPENDENT_AMBULATORY_CARE_PROVIDER_SITE_OTHER): Payer: Medicare HMO | Admitting: Internal Medicine

## 2019-10-19 VITALS — BP 140/80 | HR 50 | Temp 97.5°F | Ht 65.0 in | Wt 166.0 lb

## 2019-10-19 DIAGNOSIS — B029 Zoster without complications: Secondary | ICD-10-CM

## 2019-10-19 MED ORDER — KETOROLAC TROMETHAMINE 30 MG/ML IJ SOLN
30.0000 mg | Freq: Once | INTRAMUSCULAR | Status: AC
  Administered 2019-10-19: 30 mg via INTRAMUSCULAR

## 2019-10-19 MED ORDER — GABAPENTIN 300 MG PO CAPS: 300.0000 mg | ORAL_CAPSULE | Freq: Three times a day (TID) | ORAL | 0 refills | Status: DC

## 2019-10-19 MED ORDER — HYDROCODONE-ACETAMINOPHEN 10-325 MG PO TABS: 1.0000 | ORAL_TABLET | Freq: Three times a day (TID) | ORAL | 0 refills | Status: AC | PRN

## 2019-10-19 NOTE — Progress Notes (Signed)
   Subjective:   Patient ID: Alyssa Lindsey, female    DOB: [birth year], 85 y.o.   MRN: [id number]  HPI The patient is an 85 YO female coming in for shingles. She was diagnosed on 10/07/19 and took 1 week valtrex. She then got refill about 3 days ago due to blisters persistent and severe pain. She has started gabapentin 100 mg BID which has not helped at all with the pain. She has not tried anything else. She is miserable with pain. Denies fevers or chills. Pain is severe and rash is on the breast and flank to the left back. 10/10.  Review of Systems  Constitutional: Negative.   HENT: Negative.   Eyes: Negative.   Respiratory: Negative for cough, chest tightness and shortness of breath.   Cardiovascular: Positive for chest pain. Negative for palpitations and leg swelling.  Gastrointestinal: Negative for abdominal distention, abdominal pain, constipation, diarrhea, nausea and vomiting.  Musculoskeletal: Positive for back pain and myalgias.  Skin: Positive for rash.  Neurological: Negative.   Psychiatric/Behavioral: Negative.     Objective:  Physical Exam Constitutional:      Appearance: She is well-developed.     Comments: Appears in pain  HENT:     Head: Normocephalic and atraumatic.  Neck:     Musculoskeletal: Normal range of motion.  Cardiovascular:     Rate and Rhythm: Normal rate and regular rhythm.  Pulmonary:     Effort: Pulmonary effort is normal. No respiratory distress.     Breath sounds: Normal breath sounds. No wheezing or rales.  Chest:     Chest wall: Tenderness present.  Abdominal:     General: Bowel sounds are normal. There is no distension.     Palpations: Abdomen is soft.     Tenderness: There is no abdominal tenderness. There is no rebound.  Musculoskeletal:        General: Tenderness present.  Skin:    General: Skin is warm and dry.     Comments: Rash consistent with shingles covering upper 1/2 left breast and left chest wall, left flank, left upper  back. Very tender to touch even clothes touching it hurts  Neurological:     Mental Status: She is alert and oriented to person, place, and time.     Coordination: Coordination normal.     Vitals:   10/19/19 1047  BP: 140/80  Pulse: (!) 50  Temp: (!) 97.5 F (36.4 C)  TempSrc: Oral  SpO2: 99%  Weight: 166 lb (75.3 kg)  Height: 5\' 5"  (1.651 m)    This visit occurred during the SARS-CoV-2 public health emergency.  Safety protocols were in place, including screening questions prior to the visit, additional usage of staff PPE, and extensive cleaning of exam room while observing appropriate contact time as indicated for disinfecting solutions.   Assessment & Plan:  Toradol 30 mg IM given at visit

## 2019-10-19 NOTE — Assessment & Plan Note (Signed)
Given toradol 30 mg IM today, rx gabapentin 300 mg TID. Rx hydrocodone in case gabapentin does not provide relief due to the severe nature of her pain. Finish current valtrex then does not need more. Rash could take some time to resolve and pain could take months to resolve.

## 2019-10-19 NOTE — Patient Instructions (Signed)
We have given you a shot today for pain.   We have sent in gabapentin 300 mg to take up to 3 times a day.  We have also sent in hydrocodone which you can take for pain if gabapentin does not help up to 3 times a day.

## 2019-10-23 ENCOUNTER — Ambulatory Visit: Payer: Self-pay | Admitting: *Deleted

## 2019-10-23 ENCOUNTER — Other Ambulatory Visit: Payer: Self-pay

## 2019-10-23 ENCOUNTER — Encounter (HOSPITAL_COMMUNITY): Payer: Self-pay | Admitting: Emergency Medicine

## 2019-10-23 ENCOUNTER — Ambulatory Visit (HOSPITAL_COMMUNITY)
Admission: EM | Admit: 2019-10-23 | Discharge: 2019-10-23 | Disposition: A | Discharge status: Home / Self Care | Payer: Medicare HMO | Attending: Family Medicine | Admitting: Family Medicine

## 2019-10-23 DIAGNOSIS — B0223 Postherpetic polyneuropathy: Secondary | ICD-10-CM | POA: Diagnosis not present

## 2019-10-23 DIAGNOSIS — R001 Bradycardia, unspecified: Secondary | ICD-10-CM | POA: Diagnosis not present

## 2019-10-23 DIAGNOSIS — B028 Zoster with other complications: Secondary | ICD-10-CM | POA: Diagnosis not present

## 2019-10-23 DIAGNOSIS — I480 Paroxysmal atrial fibrillation: Secondary | ICD-10-CM

## 2019-10-23 DIAGNOSIS — R9431 Abnormal electrocardiogram [ECG] [EKG]: Secondary | ICD-10-CM | POA: Diagnosis not present

## 2019-10-23 MED ORDER — BETAMETHASONE DIPROPIONATE AUG 0.05 % EX OINT: TOPICAL_OINTMENT | Freq: Two times a day (BID) | CUTANEOUS | 0 refills | Status: DC

## 2019-10-23 MED ORDER — METHYLPREDNISOLONE SODIUM SUCC 125 MG IJ SOLR
INTRAMUSCULAR | Status: AC
  Filled 2019-10-23: qty 2

## 2019-10-23 MED ORDER — PREDNISONE 10 MG PO TABS: 30.0000 mg | ORAL_TABLET | Freq: Every day | ORAL | 0 refills | Status: AC

## 2019-10-23 MED ORDER — METHYLPREDNISOLONE SODIUM SUCC 125 MG IJ SOLR
125.0000 mg | Freq: Once | INTRAMUSCULAR | Status: AC
  Administered 2019-10-23: 125 mg via INTRAMUSCULAR

## 2019-10-23 NOTE — Discharge Instructions (Addendum)
Take your blood pressure medication upon arrival at home. Your blood pressure is elevated. I will send a note to both your Primary Care and Cardiologist that you were seen here today and the EKG performed showed a slow heart rate and heart rhythm changes compared to your readings in September. Please call both your primary care provider and cardiologist on Monday.

## 2019-10-23 NOTE — ED Provider Notes (Signed)
[facility]    CSN: [id number] Arrival date & time: 10/23/19  1617      History   Chief Complaint Chief Complaint  Patient presents with  . Herpes Zoster  . Shortness of Breath    HPI Alyssa Lindsey is a 85 y.o. female.   HPI  Patient presents with on-going neuropathic pain symptom related to shingles outbreak. Treatment for shingles initiated on 10/07/19. Patient prescribed Valtrex, Gabapentin, and Norco for pain. Continues to have unrelieved pain 10/10. Patient has an extensive cardiac history and on arrival was found to have a HR low 40's. She has not taken he blood pressure medications today. She reports only taking medication for pain due to shingles related pain. She also reports her family have expressed to her that she's exhibited shortness of breath with exertional activities. She endorses shortness of breath at present due to the level of pain she is experiencing. Performs daily weights and denies any unintentional weight gain. Denies chest pain, cough, abdominal fullness, or edema. Past Medical History:  Diagnosis Date  . Arthritis   . Chronic neck pain   . Diverticulosis of colon (without mention of hemorrhage)   . Family history of malignant neoplasm of gastrointestinal tract   . Gastritis   . Hiatal hernia   . HTN (hypertension)   . LBP (low back pain)   . Memory problem   . Spondylosis   . Type II or unspecified type diabetes mellitus without mention of complication, not stated as uncontrolled     Patient Active Problem List   Diagnosis Date Noted  . Herpes zoster without complication 93/26/7124  . Breast pain, left 10/06/2019  . Constipation 11/18/2017  . Adjustment disorder with mixed anxiety and depressed mood 07/06/2016  . Low back pain 05/09/2016  . Numbness and tingling of both legs 04/25/2016  . Bilateral leg edema 04/11/2016  . Routine general medical examination at a health care facility 12/26/2015  . GERD (gastroesophageal reflux  disease) 03/02/2015  . Paroxysmal A-fib ([place]) 03/04/2014  . Right knee pain 03/04/2014  . Chronic LLQ pain 02/07/2012  . B12 deficiency 03/06/2010  . Aneurysm of pulmonary artery ([facility]) 04/28/2008  . THYROID NODULE 03/03/2008  . BREAST MASS 03/03/2008  . Diabetes type 2, controlled ([facility]) 06/21/2007  . Essential hypertension 06/21/2007    Past Surgical History:  Procedure Laterality Date  . COLONOSCOPY  2010   NORMAL-- DUE 2020  . TUBAL LIGATION      OB History   No obstetric history on file.      Home Medications    Prior to Admission medications   Medication Sig Start Date End Date Taking? Authorizing Provider  amLODipine (NORVASC) 10 MG tablet Take 1 tablet (10 mg total) by mouth daily. 09/15/19   Minus Breeding, MD  cetirizine (ZYRTEC) 10 MG tablet TAKE ONE TABLET BY MOUTH ONCE DAILY 10/25/16   Hoyt Koch, MD  cyclobenzaprine (FLEXERIL) 5 MG tablet TAKE 1 TABLET BY MOUTH TWICE DAILY AS NEEDED FOR MUSCLE SPASM 08/13/19   Hoyt Koch, MD  diclofenac sodium (VOLTAREN) 1 % GEL APPLY TWO GRAMS THREE TIMES DAILY AS NEEDED 11/18/17   Hoyt Koch, MD  famotidine (PEPCID) 20 MG tablet Take 1 tablet (20 mg total) by mouth 2 (two) times daily. 09/18/19   Hoyt Koch, MD  gabapentin (NEURONTIN) 300 MG capsule Take 1 capsule (300 mg total) by mouth 3 (three) times daily. 10/19/19   Hoyt Koch, MD  hydrochlorothiazide (MICROZIDE)  12.5 MG capsule Take 1 capsule (12.5 mg total) by mouth daily. 09/01/18 11/30/18  Rollene RotundaHochrein, James, MD  HYDROcodone-acetaminophen (NORCO) 10-325 MG tablet Take 1 tablet by mouth every 8 (eight) hours as needed for up to 5 days. 10/19/19 10/24/19  Myrlene Brokerrawford, Elizabeth A, MD  ketoconazole (NIZORAL) 2 % cream Apply 1 application topically 2 (two) times daily as needed for irritation. 10/14/19   Corwin LevinsJohn, James W, MD  losartan (COZAAR) 50 MG tablet Take 1 tablet (50 mg total) by mouth daily. 09/01/18 11/30/18  Rollene RotundaHochrein, James, MD   metoprolol tartrate (LOPRESSOR) 25 MG tablet TAKE 1/2 (ONE-HALF) TABLET BY MOUTH TWICE DAILY 10/02/19   Myrlene Brokerrawford, Elizabeth A, MD  mirtazapine (REMERON) 15 MG tablet TAKE 1 TABLET BY MOUTH AT BEDTIME 04/24/18   Myrlene Brokerrawford, Elizabeth A, MD  nystatin-triamcinolone ointment [facility](MYCOLOG) Apply 1 application topically 2 (two) times daily. 10/06/19   Myrlene Brokerrawford, Elizabeth A, MD  pramipexole (MIRAPEX) 0.125 MG tablet Take 1 tablet (0.125 mg total) by mouth 2 (two) times daily. 08/15/19   Bradd CanaryBlyth, Stacey A, MD  traMADol (ULTRAM) 50 MG tablet TAKE 1 TABLET BY MOUTH EVERY 8 HOURS AS NEEDED 06/06/18   Myrlene Brokerrawford, Elizabeth A, MD  triamcinolone cream (KENALOG) 0.1 % Apply 1 application topically 2 (two) times daily. 10/14/19 10/13/20  Corwin LevinsJohn, James W, MD  valACYclovir (VALTREX) 1000 MG tablet Take 1 tablet (1,000 mg total) by mouth 3 (three) times daily. 10/16/19   Myrlene Brokerrawford, Elizabeth A, MD  XARELTO 15 MG TABS tablet TAKE 1 TABLET BY MOUTH ONCE DAILY WITH SUPPER 02/25/19   Rollene RotundaHochrein, James, MD    Family History Family History  Problem Relation Age of Onset  . Emphysema Brother   . Stroke Father [id number]  . Ovarian cancer Sister   . Breast cancer Sister   . Liver disease Brother   . Colon cancer Daughter   . Diabetes Daughter   . Throat cancer Brother   . Lung cancer Daughter     Social History Social History   Tobacco Use  . Smoking status: Never Smoker  . Smokeless tobacco: Never Used  Substance Use Topics  . Alcohol use: No    Alcohol/week: 0.0 standard drinks  . Drug use: No     Allergies   Dicyclomine hcl, Lisinopril, and Aspirin   Review of Systems Review of Systems Pertinent negatives listed in HPI Physical Exam Triage Vital Signs ED Triage Vitals [10/23/19 1653]  Enc Vitals Group     BP (!) 176/76     Pulse Rate (!) 46     Resp (!) 26     Temp 99.1 F (37.3 C)     Temp src      SpO2 100 %     Weight      Height      Head Circumference      Peak Flow      Pain Score 7     Pain Loc       Pain Edu?      Excl. in GC?    No data found.  Updated Vital Signs BP (!) 176/76   Pulse (!) 46   Temp 99.1 F (37.3 C)   Resp (!) 26   SpO2 100%   Visual Acuity Right Eye Distance:   Left Eye Distance:   Bilateral Distance:    Right Eye Near:   Left Eye Near:    Bilateral Near:     Physical Exam Constitutional:      Appearance: Normal appearance.  Cardiovascular:  Rate and Rhythm: Bradycardia present.  Pulmonary:     Effort: Pulmonary effort is normal.     Breath sounds: Normal breath sounds.  Chest:    Musculoskeletal:       Back:  Skin:    Findings: Rash present. Rash is crusting and vesicular.     Comments: Zoster rash present   Neurological:     Mental Status: She is alert.  Psychiatric:        Behavior: Behavior is cooperative.      UC Treatments / Results  Labs (all labs ordered are listed, but only abnormal results are displayed) Labs Reviewed - No data to display  EKG EKG: Widened QRS with PVC, RBBB, and Bradycardia with atrial flutter Changes noted compared to ECG 07/2019.  Reviewed and collaborated with Dr. Lily Peer  Radiology No results found.  Procedures Procedures (including critical care time)  Medications Ordered in UC Medications  methylPREDNISolone sodium succinate (SOLU-MEDROL) 125 mg/2 mL injection 125 mg (125 mg Intramuscular Given 10/23/19 1802)  methylPREDNISolone sodium succinate (SOLU-MEDROL) 125 mg/2 mL injection (has no administration in time range)    Initial Impression / Assessment and Plan / UC Course  I have reviewed the triage vital signs and the nursing notes.  Pertinent labs & imaging results that were available during my care of the patient were reviewed by me and considered in my medical decision making (see chart for details).   Shingles - Administered solumedrol IM here at UC. Prescribed oral prednisone 30 mg x 5 days to start tomorrow. Discontinued triamcinolone ointment. Start betamethasone ointment  for topical steroid treatment. Advised patient to follow-up with both PCP and Cardiologist on Monday regarding abnormal changes to ECG noted today. A message has been sent via Epic to both PCP and Cardiologist regarding ECG changes and persistent pain related to Shingles. Red flags discussed. Patient verbalized understanding and will follow-up with PCP.  Final Clinical Impressions(s) / UC Diagnoses   Final diagnoses:  Shingles (herpes zoster) polyneuropathy  Nonspecific abnormal electrocardiogram (ECG) (EKG)  Paroxysmal atrial fibrillation (HCC)  Bradycardia     Discharge Instructions     Take your blood pressure medication upon arrival at home. Your blood pressure is elevated. I will send a note to both your Primary Care and Cardiologist that you were seen here today and the EKG performed showed a slow heart rate and heart rhythm changes compared to your readings in September. Please call both your primary care provider and cardiologist on Monday.    ED Prescriptions    Medication Sig Dispense Auth. Provider   predniSONE (DELTASONE) 10 MG tablet Take 3 tablets (30 mg total) by mouth daily with breakfast for 5 days. 15 tablet Bing Neighbors, FNP   augmented betamethasone dipropionate (DIPROLENE-AF) 0.05 % ointment Apply topically 2 (two) times daily. Shingles lesions 60 g Bing Neighbors, FNP     PDMP not reviewed this encounter.   Bing Neighbors, FNP 10/25/19 1131

## 2019-10-23 NOTE — Telephone Encounter (Signed)
Mom has active case of shingles.  Patient is complaining of shortness of breath when ambulating to the bathroom.  Home pulse is 98. / Patient up and ambulated around the house, pulse ox was 98, and HR 42 however daughter says that HR is normal / Daughter heard some wheezing while patient was trying to put clothes in the dryer. / D/t to new onset and short of breath when walking, daughter advised to take patient to the urgent care. /  Reason for Disposition . [1] MILD difficulty breathing (e.g., minimal/no SOB at rest, SOB with walking, pulse <100) AND [2] NEW-onset or WORSE than normal  Answer Assessment - Initial Assessment Questions 1. RESPIRATORY STATUS: "Describe your breathing?" (e.g., wheezing, shortness of breath, unable to speak, severe coughing)      Shortness of breath when walking 2. ONSET: "When did this breathing problem begin?"  For a couple of days  3. PATTERN "Does the difficult breathing come and go, or has it been constant since it started?"     Only occurs when she is walking to bathroom 4. SEVERITY: "How bad is your breathing?" (e.g., mild, moderate, severe)    Only short of breath when walking. 5. RECURRENT SYMPTOM: "Have you had difficulty breathing before?" If so, ask: "When was the last time?" and "What happened that time?"      First time 6. CARDIAC HISTORY: "Do you have any history of heart disease?" (e.g., heart attack, angina, bypass surgery, angioplasty)  Atrial fibrilatoin 7. LUNG HISTORY: "Do you have any history of lung disease?"  (e.g., pulmonary embolus, asthma, emphysema)    no 8. CAUSE: "What do you think is causing the breathing problem?"  unknown 9. OTHER SYMPTOMS: "Do you have any other symptoms? (e.g., dizziness, runny nose, cough, chest pain, fever) no  11. TRAVEL: "Have you traveled out of the country in the last month?" (e.g., travel history, exposures)     no  Protocols used: BREATHING DIFFICULTY-A-AH

## 2019-10-23 NOTE — Telephone Encounter (Signed)
Pt daughter [name] stated pt is experiencing shingles and when she tries to get up to go to the bathroom she experiences SOB. [name] is with the patient and requests call back. Cb# [id number]

## 2019-10-23 NOTE — ED Triage Notes (Signed)
Pt c/o extensive shingles rash on L breast around her L back. Pt states she saw her PCP and they gave her medicine without relief. Pt also states starting yesterday, walking makes her feel SOB.

## 2019-10-26 ENCOUNTER — Ambulatory Visit
Admission: RE | Admit: 2019-10-26 | Discharge: 2019-10-26 | Disposition: A | Discharge status: Home / Self Care | Payer: Medicare HMO | Source: Ambulatory Visit | Attending: Cardiology | Admitting: Cardiology

## 2019-10-26 ENCOUNTER — Ambulatory Visit: Payer: Medicare HMO | Admitting: Cardiology

## 2019-10-26 ENCOUNTER — Telehealth: Payer: Self-pay

## 2019-10-26 ENCOUNTER — Encounter: Payer: Self-pay | Admitting: Cardiology

## 2019-10-26 ENCOUNTER — Other Ambulatory Visit: Payer: Self-pay

## 2019-10-26 VITALS — BP 158/76 | HR 40 | Ht 65.0 in | Wt 166.0 lb

## 2019-10-26 DIAGNOSIS — R0602 Shortness of breath: Secondary | ICD-10-CM

## 2019-10-26 DIAGNOSIS — Z23 Encounter for immunization: Secondary | ICD-10-CM | POA: Diagnosis not present

## 2019-10-26 NOTE — Telephone Encounter (Signed)
Went to ED

## 2019-10-26 NOTE — Patient Instructions (Signed)
Medication Instructions:  Stop taking Metoprolol.  If you need a refill on your cardiac medications before your next appointment, please call your pharmacy.   Lab work: CBC, BNP, TSH If you have labs (blood work) drawn today and your tests are completely normal, you will receive your results only by: [place] (if you have MyChart) OR A paper copy in the mail If you have any lab test that is abnormal or we need to change your treatment, we will call you to review the results.  Testing/Procedures: A chest x-ray takes a picture of the organs and structures inside the chest, including the heart, lungs, and blood vessels. This test can show several things, including, whether the heart is enlarges; whether fluid is building up in the lungs; and whether pacemaker / defibrillator leads are still in place.  Follow-Up: At [facility], you and your health needs are our priority.  As part of our continuing mission to provide you with exceptional heart care, we have created designated Provider Care Teams.  These Care Teams include your primary Cardiologist (physician) and Advanced Practice Providers (APPs -  Physician Assistants and Nurse Practitioners) who all work together to provide you with the care you need, when you need it. You may see Dr Percival Spanish or one of the following Advanced Practice Providers on your designated Care Team:    Rosaria Ferries, PA-C  Jory Sims, DNP, ANP  Cadence Kathlen Mody, NP  Your physician wants you to follow-up in: 2 months with a physicians assistant.

## 2019-10-26 NOTE — Telephone Encounter (Signed)
Spoke to lab and they were not able to draw full sets of labs on pt. Stated the pt was dehydrated. Called pt to advise to hydrate and get labs re-drawn again later this week.

## 2019-10-26 NOTE — Progress Notes (Signed)
Cardiology Office Note   Date:  10/26/2019   ID:  Alyssa Lindsey, DOB [birth year], MRN [id number]  PCP:  Myrlene Broker, MD  Cardiologist:   No primary care provider on file.   No chief complaint on file.     History of Present Illness: Alyssa Lindsey is a 85 y.o. female who presents for evaluation of SOB.  I have seen her for atrial fib.  She was added on to the schedule today because she went to urgent care the other day for SOB.      She was referred to urgent care because of shortness of breath.  She was found to have shingles.  She says she been having shortness of breath for about a week.  However, when they walked her around the urgent care the saturation was 98% despite the fact that she was dyspneic.  They did not do a chest x-ray.  She is not describing cough fevers or chills.  She is not been having any PND or orthopnea.  She is having significant pain and itching because of the shingles rash.  She is not had any weight gain or edema.  She does have a low heart rate which has been recorded.  She has atrial fibrillation and may have been in a junctional rhythm when she was in urgent care.  However, she has had no presyncope or syncope.  She is on anticoagulation and tolerates this.   Past Medical History:  Diagnosis Date  . Arthritis   . Chronic neck pain   . Diverticulosis of colon (without mention of hemorrhage)   . Family history of malignant neoplasm of gastrointestinal tract   . Gastritis   . Hiatal hernia   . HTN (hypertension)   . LBP (low back pain)   . Memory problem   . Spondylosis   . Type II or unspecified type diabetes mellitus without mention of complication, not stated as uncontrolled     Past Surgical History:  Procedure Laterality Date  . COLONOSCOPY  2010   NORMAL-- DUE 2020  . TUBAL LIGATION       Current Outpatient Medications  Medication Sig Dispense Refill  . amLODipine (NORVASC) 10 MG tablet Take 1 tablet (10 mg total) by  mouth daily. 60 tablet 0  . augmented betamethasone dipropionate (DIPROLENE-AF) 0.05 % ointment Apply topically 2 (two) times daily. Shingles lesions 60 g 0  . cetirizine (ZYRTEC) 10 MG tablet TAKE ONE TABLET BY MOUTH ONCE DAILY 30 tablet 11  . cyclobenzaprine (FLEXERIL) 5 MG tablet TAKE 1 TABLET BY MOUTH TWICE DAILY AS NEEDED FOR MUSCLE SPASM 30 tablet 0  . diclofenac sodium (VOLTAREN) 1 % GEL APPLY TWO GRAMS THREE TIMES DAILY AS NEEDED 100 g 3  . famotidine (PEPCID) 20 MG tablet Take 1 tablet (20 mg total) by mouth 2 (two) times daily. 60 tablet 2  . gabapentin (NEURONTIN) 300 MG capsule Take 1 capsule (300 mg total) by mouth 3 (three) times daily. 90 capsule 0  . hydrochlorothiazide (MICROZIDE) 12.5 MG capsule Take 1 capsule (12.5 mg total) by mouth daily. 90 capsule 3  . ketoconazole (NIZORAL) 2 % cream Apply 1 application topically 2 (two) times daily as needed for irritation. 15 g 0  . losartan (COZAAR) 50 MG tablet Take 1 tablet (50 mg total) by mouth daily. 90 tablet 3  . mirtazapine (REMERON) 15 MG tablet TAKE 1 TABLET BY MOUTH AT BEDTIME 90 tablet 1  . nystatin-triamcinolone ointment (MYCOLOG)  Apply 1 application topically 2 (two) times daily. 100 g 0  . pramipexole (MIRAPEX) 0.125 MG tablet Take 1 tablet (0.125 mg total) by mouth 2 (two) times daily. 60 tablet 1  . predniSONE (DELTASONE) 10 MG tablet Take 3 tablets (30 mg total) by mouth daily with breakfast for 5 days. 15 tablet 0  . traMADol (ULTRAM) 50 MG tablet TAKE 1 TABLET BY MOUTH EVERY 8 HOURS AS NEEDED 60 tablet 2  . valACYclovir (VALTREX) 1000 MG tablet Take 1 tablet (1,000 mg total) by mouth 3 (three) times daily. 21 tablet 0  . XARELTO 15 MG TABS tablet TAKE 1 TABLET BY MOUTH ONCE DAILY WITH SUPPER 30 tablet 5   No current facility-administered medications for this visit.     Allergies:   Dicyclomine hcl, Lisinopril, and Aspirin    ROS:  Please see the history of present illness.   Otherwise, review of systems are  positive for none.   All other systems are reviewed and negative.    PHYSICAL EXAM: VS:  BP (!) 158/76   Pulse (!) 40   Ht 5\' 5"  (1.651 m)   Wt 166 lb (75.3 kg)   SpO2 100%   BMI 27.62 kg/m  , BMI Body mass index is 27.62 kg/m. GENERAL:  Well appearing HEENT:  Pupils equal round and reactive, fundi not visualized, oral mucosa unremarkable NECK:  No jugular venous distention, waveform within normal limits, carotid upstroke brisk and symmetric, no bruits, no thyromegaly LYMPHATICS:  No cervical, inguinal adenopathy LUNGS:  Clear to auscultation bilaterally BACK:  No CVA tenderness CHEST:  Unremarkable HEART:  PMI not displaced or sustained,S1 and S2 within normal limits, no S3, no clicks, no rubs, no murmurs ABD:  Flat, positive bowel sounds normal in frequency in pitch, no bruits, no rebound, no guarding, no midline pulsatile mass, no hepatomegaly, no splenomegaly EXT:  2 plus pulses throughout, no edema, no cyanosis no clubbing SKIN: Severe rash anterior and posterior chest left side NEURO:  Cranial nerves II through XII grossly intact, motor grossly intact throughout PSYCH:  Cognitively intact, oriented to person place and time    EKG:  EKG is not ordered today. The ekg ordered 10/23/19 demonstrates atrial fibrillation, rate 52, right bundle branch block   Recent Labs: 09/11/2019: ALT 13; BUN 20; Creatinine, Ser 1.38; Hemoglobin 13.2; Platelets 163.0; Potassium 3.9; Sodium 142    Lipid Panel    Component Value Date/Time   CHOL 144 09/11/2019 0855   TRIG 125.0 09/11/2019 0855   HDL 40.70 09/11/2019 0855   CHOLHDL 4 09/11/2019 0855   VLDL 25.0 09/11/2019 0855   LDLCALC 79 09/11/2019 0855      Wt Readings from Last 3 Encounters:  10/26/19 166 lb (75.3 kg)  10/19/19 166 lb (75.3 kg)  10/07/19 162 lb (73.5 kg)      Other studies Reviewed: Additional studies/ records that were reviewed today include: Urgent care records. Review of the above records demonstrates:   Please see elsewhere in the note.     ASSESSMENT AND PLAN:  ATRIAL FIB:  Alyssa Lindsey has a CHA2DS2 - VASc score of 5 with a risk of stroke of 6.7% .  With her slow heart rate I am going to stop her beta-blocker.  HTN:  Her BP is elevated which apparently is unusual.  She is getting keep a blood pressure diary and I might need to make other changes.  SOB: I do not suspect volume overload.  There is no evidence  of pneumonia clinically or historically.  Her sats were noted to be elevated despite dyspnea when she ambulated in urgent care.  I will check blood work to include CBC, BNP, TSH and I will also order chest x-ray.   Current medicines are reviewed at length with the patient today.  The patient does not have concerns regarding medicines.  The following changes have been made:  no change  Labs/ tests ordered today include:   Orders Placed This Encounter  Procedures  . DG Chest 2 View  . Flu Vaccine QUAD High Dose(Fluad)  . CBC  . Brain natriuretic peptide  . TSH     Disposition:   FU with APP in two months.     Signed, Rollene RotundaJames Jaylina Ramdass, MD  10/26/2019 1:14 PM    [facility]

## 2019-10-29 ENCOUNTER — Other Ambulatory Visit: Payer: Self-pay | Admitting: Internal Medicine

## 2019-10-29 DIAGNOSIS — R0602 Shortness of breath: Secondary | ICD-10-CM | POA: Diagnosis not present

## 2019-10-29 DIAGNOSIS — Z23 Encounter for immunization: Secondary | ICD-10-CM | POA: Diagnosis not present

## 2019-10-30 LAB — CBC
Hematocrit: 38.7 % (ref 34.0–46.6)
Hemoglobin: 12.3 g/dL (ref 11.1–15.9)
MCH: 26.9 pg (ref 26.6–33.0)
MCHC: 31.8 g/dL (ref 31.5–35.7)
MCV: 85 fL (ref 79–97)
NRBC: 1 % — ABNORMAL HIGH (ref 0–0)
Platelets: 127 10*3/uL — ABNORMAL LOW (ref 150–450)
RBC: 4.57 x10E6/uL (ref 3.77–5.28)
RDW: 17.3 % — ABNORMAL HIGH (ref 11.7–15.4)
WBC: 9.8 10*3/uL (ref 3.4–10.8)

## 2019-10-30 LAB — TSH: TSH: 1.35 u[IU]/mL (ref 0.450–4.500)

## 2019-10-30 LAB — BRAIN NATRIURETIC PEPTIDE: BNP: 868.7 pg/mL — ABNORMAL HIGH (ref 0.0–100.0)

## 2019-11-02 ENCOUNTER — Telehealth: Payer: Self-pay | Admitting: *Deleted

## 2019-11-02 DIAGNOSIS — Z5181 Encounter for therapeutic drug level monitoring: Secondary | ICD-10-CM

## 2019-11-02 DIAGNOSIS — I10 Essential (primary) hypertension: Secondary | ICD-10-CM

## 2019-11-02 MED ORDER — FUROSEMIDE 20 MG PO TABS: 20.0000 mg | ORAL_TABLET | Freq: Every day | ORAL | 1 refills | Status: DC

## 2019-11-02 NOTE — Telephone Encounter (Signed)
-----   Message from Minus Breeding, MD sent at 10/31/2019  7:33 PM EST ----- Her BNP is elevated.  I would like to start Lasix 20 mg daily and then repeat a BMET in one week since her creat is elevated.  Call [name] with the results and send results to Hoyt Koch, MD  Lasix 20 mg daily disp number 90 with 3 refills.

## 2019-11-02 NOTE — Telephone Encounter (Signed)
Advised patient and daughter, per patient request Labs order placed and mailed to patient Rx sent to [facility]

## 2019-11-06 ENCOUNTER — Ambulatory Visit: Payer: Medicare HMO | Admitting: Internal Medicine

## 2019-11-28 ENCOUNTER — Other Ambulatory Visit: Payer: Self-pay | Admitting: Cardiology

## 2019-11-28 DIAGNOSIS — I480 Paroxysmal atrial fibrillation: Secondary | ICD-10-CM

## 2019-11-30 ENCOUNTER — Telehealth: Payer: Self-pay | Admitting: Internal Medicine

## 2019-11-30 DIAGNOSIS — Z5181 Encounter for therapeutic drug level monitoring: Secondary | ICD-10-CM | POA: Diagnosis not present

## 2019-11-30 DIAGNOSIS — I10 Essential (primary) hypertension: Secondary | ICD-10-CM | POA: Diagnosis not present

## 2019-11-30 NOTE — Telephone Encounter (Signed)
Pt's daughter called to request something to help pt with itching due to shingles. Please advise:  Pt also has questions about if/when she should get the Shingles vaccine.  [facility] - [place], [place] - [address] Phone:  [phone]  Fax:  [phone]     Pt: [phone]

## 2019-12-01 LAB — BASIC METABOLIC PANEL
BUN/Creatinine Ratio: 17 (ref 12–28)
BUN: 23 mg/dL (ref 8–27)
CO2: 24 mmol/L (ref 20–29)
Calcium: 9.2 mg/dL (ref 8.7–10.3)
Chloride: 106 mmol/L (ref 96–106)
Creatinine, Ser: 1.37 mg/dL — ABNORMAL HIGH (ref 0.57–1.00)
GFR calc Af Amer: 40 mL/min/{1.73_m2} — ABNORMAL LOW (ref >59)
GFR calc non Af Amer: 34 mL/min/{1.73_m2} — ABNORMAL LOW (ref >59)
Glucose: 146 mg/dL — ABNORMAL HIGH (ref 65–99)
Potassium: 4.3 mmol/L (ref 3.5–5.2)
Sodium: 148 mmol/L — ABNORMAL HIGH (ref 134–144)

## 2019-12-02 ENCOUNTER — Ambulatory Visit (INDEPENDENT_AMBULATORY_CARE_PROVIDER_SITE_OTHER): Payer: Medicare HMO | Admitting: Internal Medicine

## 2019-12-02 ENCOUNTER — Telehealth: Payer: Self-pay | Admitting: Internal Medicine

## 2019-12-02 ENCOUNTER — Encounter: Payer: Self-pay | Admitting: Internal Medicine

## 2019-12-02 ENCOUNTER — Other Ambulatory Visit: Payer: Self-pay

## 2019-12-02 VITALS — BP 144/84 | HR 71 | Temp 98.5°F | Ht 65.0 in | Wt 161.0 lb

## 2019-12-02 DIAGNOSIS — Z23 Encounter for immunization: Secondary | ICD-10-CM | POA: Insufficient documentation

## 2019-12-02 DIAGNOSIS — B0229 Other postherpetic nervous system involvement: Secondary | ICD-10-CM | POA: Diagnosis not present

## 2019-12-02 MED ORDER — OXYCODONE HCL 5 MG PO TABS: 5.0000 mg | ORAL_TABLET | Freq: Four times a day (QID) | ORAL | 0 refills | Status: AC | PRN

## 2019-12-02 MED ORDER — OXYCODONE HCL 5 MG PO TABS: 5.0000 mg | ORAL_TABLET | Freq: Four times a day (QID) | ORAL | 0 refills | Status: DC | PRN

## 2019-12-02 MED ORDER — SHINGRIX 50 MCG/0.5ML IM SUSR: 0.5000 mL | Freq: Once | INTRAMUSCULAR | 1 refills | Status: AC

## 2019-12-02 MED ORDER — PREGABALIN 50 MG PO CAPS: 50.0000 mg | ORAL_CAPSULE | Freq: Three times a day (TID) | ORAL | 3 refills | Status: DC

## 2019-12-02 NOTE — Telephone Encounter (Signed)
Pts daughter called stating that they are needing to have the PA for the Lyrica. Pts daughter states she would not like an alternative. Please advise.    PA Callback # [phone]

## 2019-12-02 NOTE — Progress Notes (Signed)
Subjective:  Patient ID: Alyssa Lindsey, female    DOB: February 14, 1929  Age: 86 y.o. MRN: [id number]  CC: Rash  This visit occurred during the SARS-CoV-2 public health emergency.  Safety protocols were in place, including screening questions prior to the visit, additional usage of staff PPE, and extensive cleaning of exam room while observing appropriate contact time as indicated for disinfecting solutions.   NEW TO ME  HPI Alyssa Lindsey presents for a concern about pain over her left chest wall.  Apparently she was treated for shingles about 2 months ago.  She reports that the rash has resolved but she continues to have pain in the area.  She describes it as a stinging and a stabbing sensation.  She has not gotten symptom relief with tramadol, gabapentin, or a few topical agents.  I have been informed that lidocaine patches were not covered by her insurance.  Outpatient Medications Prior to Visit  Medication Sig Dispense Refill  . amLODipine (NORVASC) 10 MG tablet Take 1 tablet by mouth once daily 90 tablet 3  . augmented betamethasone dipropionate (DIPROLENE-AF) 0.05 % ointment Apply topically 2 (two) times daily. Shingles lesions 60 g 0  . cetirizine (ZYRTEC) 10 MG tablet TAKE ONE TABLET BY MOUTH ONCE DAILY 30 tablet 11  . cyclobenzaprine (FLEXERIL) 5 MG tablet TAKE 1 TABLET BY MOUTH TWICE DAILY AS NEEDED FOR MUSCLE SPASM 30 tablet 0  . diclofenac sodium (VOLTAREN) 1 % GEL APPLY TWO GRAMS THREE TIMES DAILY AS NEEDED 100 g 3  . famotidine (PEPCID) 20 MG tablet Take 1 tablet (20 mg total) by mouth 2 (two) times daily. 60 tablet 2  . furosemide (LASIX) 20 MG tablet Take 1 tablet (20 mg total) by mouth daily. 90 tablet 1  . ketoconazole (NIZORAL) 2 % cream Apply 1 application topically 2 (two) times daily as needed for irritation. 15 g 0  . mirtazapine (REMERON) 15 MG tablet TAKE 1 TABLET BY MOUTH AT BEDTIME 90 tablet 1  . nystatin-triamcinolone ointment (MYCOLOG) Apply 1 application  topically 2 (two) times daily. 100 g 0  . pramipexole (MIRAPEX) 0.125 MG tablet Take 1 tablet (0.125 mg total) by mouth 2 (two) times daily. 60 tablet 1  . valACYclovir (VALTREX) 1000 MG tablet Take 1 tablet (1,000 mg total) by mouth 3 (three) times daily. 21 tablet 0  . XARELTO 15 MG TABS tablet TAKE 1 TABLET BY MOUTH ONCE DAILY WITH SUPPER 30 tablet 5  . gabapentin (NEURONTIN) 300 MG capsule Take 1 capsule (300 mg total) by mouth 3 (three) times daily. 90 capsule 0  . traMADol (ULTRAM) 50 MG tablet TAKE 1 TABLET BY MOUTH EVERY 8 HOURS AS NEEDED 60 tablet 2  . hydrochlorothiazide (MICROZIDE) 12.5 MG capsule Take 1 capsule (12.5 mg total) by mouth daily. 90 capsule 3  . losartan (COZAAR) 50 MG tablet Take 1 tablet (50 mg total) by mouth daily. 90 tablet 3   No facility-administered medications prior to visit.    ROS Review of Systems  Constitutional: Negative for chills, diaphoresis, fatigue and fever.  HENT: Negative.   Eyes: Negative for visual disturbance.  Respiratory: Negative for cough, chest tightness, shortness of breath and wheezing.   Cardiovascular: Positive for chest pain. Negative for palpitations and leg swelling.  Gastrointestinal: Negative for abdominal pain, diarrhea, nausea and vomiting.  Endocrine: Negative.   Genitourinary: Negative.  Negative for difficulty urinating and hematuria.  Musculoskeletal: Negative.   Skin: Positive for rash. Negative for color change.  Neurological:  Negative.  Negative for dizziness, weakness and headaches.  Hematological: Negative for adenopathy. Does not bruise/bleed easily.  Psychiatric/Behavioral: Negative.     Objective:  BP (!) 144/84 (BP Location: Left Arm, Patient Position: Sitting, Cuff Size: Normal)   Pulse 71   Temp 98.5 F (36.9 C) (Oral)   Ht 5\' 5"  (1.651 m)   Wt 161 lb (73 kg)   SpO2 98%   BMI 26.79 kg/m   BP Readings from Last 3 Encounters:  12/02/19 (!) 144/84  10/26/19 (!) 158/76  10/23/19 (!) 176/76    Wt  Readings from Last 3 Encounters:  12/02/19 161 lb (73 kg)  10/26/19 166 lb (75.3 kg)  10/19/19 166 lb (75.3 kg)    Physical Exam Vitals reviewed.  Constitutional:      Appearance: Normal appearance.  HENT:     Nose: Nose normal.     Mouth/Throat:     Mouth: Mucous membranes are moist.  Eyes:     General: No scleral icterus.    Conjunctiva/sclera: Conjunctivae normal.  Cardiovascular:     Rate and Rhythm: Normal rate. Rhythm irregularly irregular.     Heart sounds: No murmur.  Pulmonary:     Effort: Pulmonary effort is normal.     Breath sounds: No stridor. No wheezing, rhonchi or rales.  Chest:     Chest wall: No mass, swelling or edema.    Abdominal:     General: Abdomen is flat.     Palpations: There is no mass.     Tenderness: There is no abdominal tenderness. There is no guarding.  Musculoskeletal:        General: Normal range of motion.     Cervical back: Neck supple.     Right lower leg: No edema.     Left lower leg: No edema.  Lymphadenopathy:     Cervical: No cervical adenopathy.  Skin:    Findings: Rash present. No abrasion, abscess, ecchymosis, lesion or petechiae. Rash is macular. Rash is not crusting, nodular, papular, purpuric, pustular, scaling, urticarial or vesicular.  Neurological:     General: No focal deficit present.     Mental Status: She is alert.  Psychiatric:        Mood and Affect: Mood normal.        Behavior: Behavior normal.     Lab Results  Component Value Date   WBC 9.8 10/29/2019   HGB 12.3 10/29/2019   HCT 38.7 10/29/2019   PLT 127 (L) 10/29/2019   GLUCOSE 146 (H) 11/30/2019   CHOL 144 09/11/2019   TRIG 125.0 09/11/2019   HDL 40.70 09/11/2019   LDLCALC 79 09/11/2019   ALT 13 09/11/2019   AST 15 09/11/2019   NA 148 (H) 11/30/2019   K 4.3 11/30/2019   CL 106 11/30/2019   CREATININE 1.37 (H) 11/30/2019   BUN 23 11/30/2019   CO2 24 11/30/2019   TSH 1.350 10/29/2019   INR 0.98 03/04/2014   HGBA1C 7.1 (H) 09/11/2019    MICROALBUR 1.1 09/11/2019    DG Chest 2 View  Result Date: 10/26/2019 CLINICAL DATA:  Shortness of breath for several weeks. Hypertension EXAM: CHEST - 2 VIEW COMPARISON:  11/15/2015. FINDINGS: Stable borderline cardiomegaly and ectasia of the thoracic aorta. Both lungs are clear. Mild thoracic spine degenerative changes again seen. IMPRESSION: Stable borderline cardiomegaly. No active lung disease. Electronically Signed   By: Marlaine Hind M.D.   On: 10/26/2019 20:44    Assessment & Plan:   Alyssa Lindsey was seen  today for rash.  Diagnoses and all orders for this visit:  PHN (postherpetic neuralgia)- Based on her symptoms and exam the infection has resolved.  I recommended that she treat the postherpetic neuralgia with pregabalin and oxycodone as needed. -     pregabalin (LYRICA) 50 MG capsule; Take 1 capsule (50 mg total) by mouth 3 (three) times daily. -     Discontinue: oxyCODONE (OXY IR/ROXICODONE) 5 MG immediate release tablet; Take 1 tablet (5 mg total) by mouth every 6 (six) hours as needed for up to 7 days for moderate pain or severe pain. -     oxyCODONE (OXY IR/ROXICODONE) 5 MG immediate release tablet; Take 1 tablet (5 mg total) by mouth every 6 (six) hours as needed for up to 7 days for moderate pain or severe pain.  Need for shingles vaccine -     Zoster Vaccine Adjuvanted [facility]) injection; Inject 0.5 mLs into the muscle once for 1 dose.   I have discontinued Talah B. Wiemers's traMADol and gabapentin. I am also having her start on pregabalin and Shingrix. Additionally, I am having her maintain her cetirizine, diclofenac sodium, mirtazapine, losartan, hydrochlorothiazide, Xarelto, cyclobenzaprine, pramipexole, famotidine, nystatin-triamcinolone ointment, ketoconazole, valACYclovir, augmented betamethasone dipropionate, furosemide, amLODipine, and oxyCODONE.  Meds ordered this encounter  Medications  . pregabalin (LYRICA) 50 MG capsule    Sig: Take 1 capsule (50 mg total) by  mouth 3 (three) times daily.    Dispense:  90 capsule    Refill:  3  . DISCONTD: oxyCODONE (OXY IR/ROXICODONE) 5 MG immediate release tablet    Sig: Take 1 tablet (5 mg total) by mouth every 6 (six) hours as needed for up to 7 days for moderate pain or severe pain.    Dispense:  45 tablet    Refill:  0  . oxyCODONE (OXY IR/ROXICODONE) 5 MG immediate release tablet    Sig: Take 1 tablet (5 mg total) by mouth every 6 (six) hours as needed for up to 7 days for moderate pain or severe pain.    Dispense:  45 tablet    Refill:  0  . Zoster Vaccine Adjuvanted [facility]) injection    Sig: Inject 0.5 mLs into the muscle once for 1 dose.    Dispense:  0.5 mL    Refill:  1     Follow-up: Return in about 3 weeks (around 12/23/2019).  Sanda Linger, MD

## 2019-12-02 NOTE — Telephone Encounter (Signed)
Pt called in and stated that ins needed a PA on the lyrica , she is going to call her ins comp and find out with med her ins will cover

## 2019-12-02 NOTE — Patient Instructions (Signed)
Postherpetic Neuralgia Postherpetic neuralgia (PHN) is nerve pain that occurs after a shingles infection. Shingles is a painful rash that appears on one area of the body, usually on the trunk or face. Shingles is caused by the varicella-zoster virus. This is the same virus that causes chickenpox. In people who have had chickenpox, the virus can resurface years later and cause shingles. You may have PHN if you continue to have pain for 4 months after your shingles rash has gone away. PHN appears in the same area where you had the shingles rash. The pain usually goes away after the rash disappears. Getting a vaccination for shingles can prevent PHN. This vaccine is recommended for people older than 60. It may prevent shingles, and may also lower your risk of PHN if you do get shingles. What are the causes? This condition is caused by damage to your nerves from the varicella-zoster virus. The damage makes your nerves overly sensitive. What increases the risk? The following factors may make you more likely to develop this condition:  Being older than 84 years of age.  Having severe pain before your shingles rash starts.  Having a severe rash.  Having shingles in and around the eye area.  Having a disease that makes your body unable to fight infections (weak immune system). What are the signs or symptoms? The main symptom of this condition is pain. The pain may:  Often be very bad and may be described as stabbing, burning, or feeling like an electric shock.  Come and go or may be there all the time.  Be triggered by light touches on the skin or changes in temperature. You may have itching along with the pain. How is this diagnosed? This condition may be diagnosed based on your symptoms and your history of shingles. Lab studies and other diagnostic tests are usually not needed. How is this treated? There is no cure for this condition. Treatment for PHN will focus on pain relief.  Over-the-counter pain relievers do not usually relieve PHN pain. You may need to work with a pain specialist. Treatment may include:  Antidepressant medicines to help with pain and improve sleep.  Anti-seizure medicines to relieve nerve pain.  Strong pain relievers (opioids).  A numbing patch worn on the skin (lidocaine patch).  Botox (botulinum toxin) injections to block pain signals between nerves and muscles.  Injections of numbing medicine or anti-inflammatory medicines around irritated nerves. Follow these instructions at home:   It may take a long time to recover from PHN. Work closely with your health care provider and develop a good support system at home.  Take over-the-counter and prescription medicines only as told by your health care provider.  Do not drive or use heavy machinery while taking prescription pain medicine.  Wear loose, comfortable clothing.  Cover sensitive areas with a dressing to reduce friction from clothing rubbing on the area.  If directed, put ice on the painful area: ? Put ice in a plastic bag. ? Place a towel between your skin and the bag. ? Leave the ice on for 20 minutes, 2-3 times a day.  Talk to your health care provider if you feel depressed or desperate. Living with long-term pain can be depressing.  Keep all follow-up visits as told by your health care provider. This is important. Contact a health care provider if:  Your medicine is not helping.  You are struggling to manage your pain at home. Summary  Postherpetic neuralgia is a very painful disorder   that can occur after an episode of shingles.  The pain is often severe, burning, electric, or stabbing.  Prescription medicines can be helpful in managing persistent pain.  Getting a vaccination for shingles can prevent PHN. This vaccine is recommended for people older than 60. This information is not intended to replace advice given to you by your health care provider. Make sure  you discuss any questions you have with your health care provider. Document Revised: 10/25/2017 Document Reviewed: 01/29/2017 Elsevier Patient Education  2020 [employer] Inc[employer]  

## 2019-12-03 NOTE — Telephone Encounter (Signed)
Key: Garey Ham

## 2019-12-04 NOTE — Telephone Encounter (Signed)
Pt's daughter called in to check on status of PA as they still have not heard anything from office or pharmacy. Please advise.

## 2019-12-04 NOTE — Telephone Encounter (Signed)
This approval authorizes your coverage from 11/27/2019 - 11/25/2020,

## 2019-12-07 ENCOUNTER — Telehealth: Payer: Self-pay

## 2019-12-07 ENCOUNTER — Encounter: Payer: Self-pay | Admitting: Internal Medicine

## 2019-12-07 MED ORDER — HYDROXYZINE HCL 10 MG PO TABS: 10.0000 mg | ORAL_TABLET | Freq: Three times a day (TID) | ORAL | 0 refills | Status: DC | PRN

## 2019-12-07 NOTE — Telephone Encounter (Signed)
Copied from CRM [id number]. Topic: General - Inquiry >> Dec 07, 2019  9:31 AM Floria Raveling A wrote: Reason for CRM: pt daughter called in stated that pt is itching really bad from the shingles and would like to know if there is anything that she can take our given to help.  She has if she could get a prednisone injection ?  Please advise  Best number [phone]

## 2019-12-07 NOTE — Telephone Encounter (Signed)
PA came back approved

## 2019-12-07 NOTE — Telephone Encounter (Signed)
She can take benadryl over the counter. I would not recommend steroid injection as this can suppress the immune system which may make the shingles worse.

## 2019-12-07 NOTE — Telephone Encounter (Signed)
Sent in hydroxyzine to use as needed for itching (do not take with benadryl as they are similar). I would also recommend to increase zyrtec to TID dosing for next 1-2 weeks to help itching.

## 2019-12-07 NOTE — Telephone Encounter (Signed)
Key: [id number]  I had a PA for [facility] come up on my "Cover my meds". I did submit before I found this phone note.

## 2019-12-07 NOTE — Telephone Encounter (Signed)
Pt's daughter has been informed and expressed understanding.  

## 2019-12-07 NOTE — Telephone Encounter (Signed)
Pt's daughter stated that she has been taking Benadryl but it isn't helping the itching. Please advise.

## 2019-12-07 NOTE — Addendum Note (Signed)
Addended by: Hillard Danker A on: 12/07/2019 11:29 AM   Modules accepted: Orders

## 2019-12-07 NOTE — Telephone Encounter (Signed)
error 

## 2019-12-08 NOTE — Telephone Encounter (Signed)
Patient daughter called [name] called stating she has more questions regarding medication.  Call back [phone]

## 2019-12-14 ENCOUNTER — Other Ambulatory Visit: Payer: Self-pay | Admitting: Internal Medicine

## 2019-12-20 ENCOUNTER — Other Ambulatory Visit: Payer: Self-pay | Admitting: Internal Medicine

## 2019-12-26 ENCOUNTER — Other Ambulatory Visit: Payer: Self-pay | Admitting: Internal Medicine

## 2019-12-28 ENCOUNTER — Ambulatory Visit: Payer: Medicare HMO | Admitting: Adult Health

## 2019-12-29 ENCOUNTER — Other Ambulatory Visit: Payer: Self-pay | Admitting: Cardiology

## 2019-12-29 DIAGNOSIS — I480 Paroxysmal atrial fibrillation: Secondary | ICD-10-CM

## 2019-12-29 NOTE — Progress Notes (Signed)
Cardiology Office Note   Date:  12/30/2019   ID:  Alyssa Lindsey, DOB 1929/10/06, MRN [id number]  PCP:  Myrlene Broker, MD  Cardiologist:  Dr. Antoine Poche  CC: Follow Up    History of Present Illness: Alyssa Lindsey is a 86 y.o. female who presents for ongoing assessment and management of chronic dyspnea and atrial fib CHADS VASC Score of 5.  . Other history includes HTN, Type II diabetes, chronic neck and back pain and arthritis.   On last office visit with Dr. Antoine Poche, she was found to be bradycardic, therefore he stopped BB. She was not found to have fluid overload despite her complaints of dyspnea. Labs were ordered to included a CBC, BNP, TSH, and CXR.   BNP was found to be elevated at 868, and therefore Dr. Antoine Poche began lasix 20 mg daily with repeat labs in one week as her creatinine was elevated. Repeat labs did not reveal worsening kidney function with institution of lasix and therefore she was to continue her regimen.Creatinine was 1.37, with prior creatinine of 1/38. Weight at the time of that office visit was 166 lbs.   She was seen by PCP, Dr. Yetta Barre on 12/02/2019 for follow up on shingles, and noted to have a weight of 161 lbs.  Today she continues to have neuralgic pain and purities from Shingles. She denies cardiac chest pain, DOE, or dizziness. She has some generalized fatigue from the virus.      Past Medical History:  Diagnosis Date  . Arthritis   . Chronic neck pain   . Diverticulosis of colon (without mention of hemorrhage)   . Family history of malignant neoplasm of gastrointestinal tract   . Gastritis   . Hiatal hernia   . HTN (hypertension)   . LBP (low back pain)   . Memory problem   . Spondylosis   . Type II or unspecified type diabetes mellitus without mention of complication, not stated as uncontrolled     Past Surgical History:  Procedure Laterality Date  . COLONOSCOPY  2010   NORMAL-- DUE 2020  . TUBAL LIGATION       Current Outpatient  Medications  Medication Sig Dispense Refill  . amLODipine (NORVASC) 10 MG tablet Take 1 tablet (10 mg total) by mouth daily. 90 tablet 3  . augmented betamethasone dipropionate (DIPROLENE-AF) 0.05 % ointment Apply topically 2 (two) times daily. Shingles lesions 60 g 0  . cetirizine (ZYRTEC) 10 MG tablet TAKE ONE TABLET BY MOUTH ONCE DAILY 30 tablet 11  . cyclobenzaprine (FLEXERIL) 5 MG tablet TAKE 1 TABLET BY MOUTH TWICE DAILY AS NEEDED FOR MUSCLE SPASM 30 tablet 0  . diclofenac sodium (VOLTAREN) 1 % GEL APPLY TWO GRAMS THREE TIMES DAILY AS NEEDED 100 g 3  . famotidine (PEPCID) 20 MG tablet Take 1 tablet (20 mg total) by mouth 2 (two) times daily. 60 tablet 2  . furosemide (LASIX) 20 MG tablet Take 1 tablet (20 mg total) by mouth daily. 90 tablet 3  . hydrOXYzine (ATARAX/VISTARIL) 10 MG tablet Take 1 tablet by mouth three times daily as needed 30 tablet 2  . mirtazapine (REMERON) 15 MG tablet TAKE 1 TABLET BY MOUTH ONCE DAILY AT BEDTIME 90 tablet 0  . nystatin-triamcinolone ointment (MYCOLOG) Apply 1 application topically 2 (two) times daily. 100 g 0  . pramipexole (MIRAPEX) 0.125 MG tablet Take 1 tablet (0.125 mg total) by mouth 2 (two) times daily. 60 tablet 1  . pregabalin (LYRICA) 50 MG capsule Take  1 capsule (50 mg total) by mouth 3 (three) times daily. 90 capsule 3  . Rivaroxaban (XARELTO) 15 MG TABS tablet Take 1 tablet (15 mg total) by mouth daily with supper. 30 tablet 9  . valACYclovir (VALTREX) 1000 MG tablet Take 1 tablet (1,000 mg total) by mouth 3 (three) times daily. 21 tablet 0  . hydrochlorothiazide (MICROZIDE) 12.5 MG capsule Take 1 capsule (12.5 mg total) by mouth daily. 90 capsule 3  . ketoconazole (NIZORAL) 2 % cream Apply 1 application topically 2 (two) times daily as needed for irritation. (Patient not taking: Reported on 12/30/2019) 15 g 0  . losartan (COZAAR) 50 MG tablet Take 1 tablet (50 mg total) by mouth daily. 90 tablet 3   No current facility-administered medications  for this visit.    Allergies:   Dicyclomine hcl, Lisinopril, and Aspirin    Social History:  The patient  reports that she has never smoked. She has never used smokeless tobacco. She reports that she does not drink alcohol or use drugs.   Family History:  The patient's family history includes Breast cancer in her sister; Colon cancer in her daughter; Diabetes in her daughter; Emphysema in her brother; Liver disease in her brother; Lung cancer in her daughter; Ovarian cancer in her sister; Stroke (age of onset: 68) in her father; Throat cancer in her brother.    ROS: All other systems are reviewed and negative. Unless otherwise mentioned in H&P    PHYSICAL EXAM: VS:  BP (!) 151/73   Pulse 75   Temp (!) 97.2 F (36.2 C)   Ht 5\' 5"  (1.651 m)   Wt 160 lb 6.4 oz (72.8 kg)   SpO2 98%   BMI 26.69 kg/m  , BMI Body mass index is 26.69 kg/m. GEN: Well nourished, well developed, in no acute distress HEENT: normal Neck: no JVD, carotid bruits, or masses Cardiac: RRR; no murmurs, rubs, or gallops,no edema  Respiratory:  Clear to auscultation bilaterally, normal work of breathing GI: soft, nontender, nondistended, + BS MS: no deformity or atrophy Skin: warm and dry, no rash, darkened skin on the left upper abdomen under the breast and on the ipsilateral upper back. Pain full with movement.  Neuro:  Strength and sensation are intact Psych: euthymic mood, full affect   EKG:  Not completed this office visit.   Recent Labs: 09/11/2019: ALT 13 10/29/2019: BNP 868.7; Hemoglobin 12.3; Platelets 127; TSH 1.350 11/30/2019: BUN 23; Creatinine, Ser 1.37; Potassium 4.3; Sodium 148    Lipid Panel    Component Value Date/Time   CHOL 144 09/11/2019 0855   TRIG 125.0 09/11/2019 0855   HDL 40.70 09/11/2019 0855   CHOLHDL 4 09/11/2019 0855   VLDL 25.0 09/11/2019 0855   LDLCALC 79 09/11/2019 0855      Wt Readings from Last 3 Encounters:  12/30/19 160 lb 6.4 oz (72.8 kg)  12/02/19 161 lb (73  kg)  10/26/19 166 lb (75.3 kg)      Other studies Reviewed:  None  ASSESSMENT AND PLAN:  1. Diastolic CHF: Better with lasix. She is in the middle of Shingles outbreak and therefore we will wait to do any further testing do her pain.  She appears to be euvolemic currently. No changes in her regimen.   2. Atrial fib; Continues on Xarelto.   2. Hypertension: Slightly elevated today.  She is have a good bit of pain.  Will not make any changes at this time.   3. Diabetes: Followed by Dr.  Jones  4. Shingles outbreak: Currently on Valacyclovir, and pain control.    Current medicines are reviewed at length with the patient today.    Labs/ tests ordered today include: None   Phill Myron. West Pugh, ANP, AACC   12/30/2019 3:53 PM    [facility] [place] [address] Office [phone] Fax [phone]  Notice: This dictation was prepared with Dragon dictation along with smaller phrase technology. Any transcriptional errors that result from this process are unintentional and may not be corrected upon review.

## 2019-12-29 NOTE — Telephone Encounter (Signed)
Rx has been sent to the pharmacy electronically. ° °

## 2019-12-30 ENCOUNTER — Encounter: Payer: Self-pay | Admitting: Adult Health

## 2019-12-30 ENCOUNTER — Other Ambulatory Visit: Payer: Self-pay

## 2019-12-30 ENCOUNTER — Ambulatory Visit: Payer: Medicare HMO | Admitting: Adult Health

## 2019-12-30 DIAGNOSIS — I480 Paroxysmal atrial fibrillation: Secondary | ICD-10-CM

## 2019-12-30 MED ORDER — RIVAROXABAN 15 MG PO TABS: 15.0000 mg | ORAL_TABLET | Freq: Every day | ORAL | 9 refills | Status: DC

## 2019-12-30 MED ORDER — AMLODIPINE BESYLATE 10 MG PO TABS: 10.0000 mg | ORAL_TABLET | Freq: Every day | ORAL | 3 refills | Status: DC

## 2019-12-30 MED ORDER — LOSARTAN POTASSIUM 50 MG PO TABS: 50.0000 mg | ORAL_TABLET | Freq: Every day | ORAL | 3 refills | Status: DC

## 2019-12-30 MED ORDER — HYDROCHLOROTHIAZIDE 12.5 MG PO CAPS: 12.5000 mg | ORAL_CAPSULE | Freq: Every day | ORAL | 3 refills | Status: DC

## 2019-12-30 MED ORDER — FUROSEMIDE 20 MG PO TABS: 20.0000 mg | ORAL_TABLET | Freq: Every day | ORAL | 3 refills | Status: DC

## 2019-12-30 NOTE — Patient Instructions (Signed)
Medication Instructions:  Continue current medications  *If you need a refill on your cardiac medications before your next appointment, please call your pharmacy*  Lab Work: None Ordered  Testing/Procedures: None Ordered  Follow-Up: At [facility], you and your health needs are our priority.  As part of our continuing mission to provide you with exceptional heart care, we have created designated Provider Care Teams.  These Care Teams include your primary Cardiologist (physician) and Advanced Practice Providers (APPs -  Physician Assistants and Nurse Practitioners) who all work together to provide you with the care you need, when you need it.  Your next appointment:   6 month(s)  The format for your next appointment:   In Person  Provider:   You may see Dr Rollene Rotunda, or one of the following Advanced Practice Providers on your designated Care Team:    Theodore Demark, PA-C  Joni Reining, DNP, ANP  Cadence Fransico Michael, NP

## 2020-01-14 ENCOUNTER — Ambulatory Visit: Payer: Medicare HMO | Admitting: Internal Medicine

## 2020-01-15 ENCOUNTER — Ambulatory Visit (INDEPENDENT_AMBULATORY_CARE_PROVIDER_SITE_OTHER): Payer: Medicare HMO | Admitting: Internal Medicine

## 2020-01-15 ENCOUNTER — Ambulatory Visit: Payer: Medicare HMO | Admitting: Internal Medicine

## 2020-01-15 ENCOUNTER — Encounter: Payer: Self-pay | Admitting: Internal Medicine

## 2020-01-15 ENCOUNTER — Other Ambulatory Visit: Payer: Self-pay

## 2020-01-15 DIAGNOSIS — B0229 Other postherpetic nervous system involvement: Secondary | ICD-10-CM | POA: Diagnosis not present

## 2020-01-15 MED ORDER — CYCLOBENZAPRINE HCL 5 MG PO TABS: 5.0000 mg | ORAL_TABLET | Freq: Three times a day (TID) | ORAL | 1 refills | Status: DC | PRN

## 2020-01-15 NOTE — Progress Notes (Signed)
Virtual Visit via Audio Note  I connected with Alyssa Lindsey on 01/15/20 at  2:00 PM EST by an audio-only enabled telemedicine application and verified that I am speaking with the correct person using two identifiers.  The patient and the provider were at separate locations throughout the entire encounter.   I discussed the limitations of evaluation and management by telemedicine and the availability of in person appointments. The patient expressed understanding and agreed to proceed. The patient and the provider were the only parties present for the visit unless noted in HPI below.  History of Present Illness: The patient is a 86 y.o. female with visit for shingles pain. Treated for shingles and diagnosed back in November. Has been struggling with pain in the back and breast. Has not been able to get the lyrica which was prescribed back in January due to needing PA and then they were not aware this had been approved. She did try the oxycodone prescribed in January and did not think this helped. Denies rash on skin. Overall it is not improving since November.  Observations/Objective: A and O times 3, no cough or dyspnea during visit  Assessment and Plan: See problem oriented charting  Follow Up Instructions: rx flexeril to try, asked them to pick up lyrica and try this. Call if too expensive.   Visit time 9 minutes in non-face to face communication with patient and coordination of care.  I discussed the assessment and treatment plan with the patient. The patient was provided an opportunity to ask questions and all were answered. The patient agreed with the plan and demonstrated an understanding of the instructions.   The patient was advised to call back or seek an in-person evaluation if the symptoms worsen or if the condition fails to improve as anticipated.  Myrlene Broker, MD

## 2020-01-15 NOTE — Assessment & Plan Note (Signed)
Advised lyrica was approved and she should try this. Also refilled flexeril to try also for pain. Oxycodone did not provide relief so would avoid opioids as they are ineffective.

## 2020-01-21 ENCOUNTER — Other Ambulatory Visit: Payer: Self-pay | Admitting: Internal Medicine

## 2020-02-12 ENCOUNTER — Encounter: Payer: Self-pay | Admitting: Internal Medicine

## 2020-02-19 ENCOUNTER — Other Ambulatory Visit: Payer: Self-pay | Admitting: Internal Medicine

## 2020-02-19 ENCOUNTER — Encounter: Payer: Self-pay | Admitting: Internal Medicine

## 2020-02-19 LAB — NOVEL CORONAVIRUS, NAA

## 2020-02-22 ENCOUNTER — Encounter: Payer: Self-pay | Admitting: Family

## 2020-02-22 ENCOUNTER — Emergency Department (HOSPITAL_COMMUNITY): Payer: Medicare HMO

## 2020-02-22 ENCOUNTER — Ambulatory Visit (INDEPENDENT_AMBULATORY_CARE_PROVIDER_SITE_OTHER): Payer: Medicare HMO

## 2020-02-22 ENCOUNTER — Telehealth: Payer: Self-pay | Admitting: Family

## 2020-02-22 ENCOUNTER — Ambulatory Visit (INDEPENDENT_AMBULATORY_CARE_PROVIDER_SITE_OTHER): Payer: Medicare HMO | Admitting: Family

## 2020-02-22 ENCOUNTER — Other Ambulatory Visit: Payer: Self-pay

## 2020-02-22 ENCOUNTER — Encounter (HOSPITAL_COMMUNITY): Payer: Self-pay | Admitting: *Deleted

## 2020-02-22 ENCOUNTER — Emergency Department (HOSPITAL_COMMUNITY)
Admission: EM | Admit: 2020-02-22 | Discharge: 2020-02-22 | Disposition: A | Discharge status: Home / Self Care | Payer: Medicare HMO | Attending: Emergency Medicine | Admitting: Emergency Medicine

## 2020-02-22 VITALS — BP 158/84 | HR 41 | Temp 98.3°F | Ht 65.0 in | Wt 170.6 lb

## 2020-02-22 DIAGNOSIS — E119 Type 2 diabetes mellitus without complications: Secondary | ICD-10-CM | POA: Diagnosis not present

## 2020-02-22 DIAGNOSIS — R0602 Shortness of breath: Secondary | ICD-10-CM | POA: Diagnosis not present

## 2020-02-22 DIAGNOSIS — R0609 Other forms of dyspnea: Secondary | ICD-10-CM

## 2020-02-22 DIAGNOSIS — I10 Essential (primary) hypertension: Secondary | ICD-10-CM | POA: Diagnosis not present

## 2020-02-22 DIAGNOSIS — B0229 Other postherpetic nervous system involvement: Secondary | ICD-10-CM

## 2020-02-22 DIAGNOSIS — R0600 Dyspnea, unspecified: Secondary | ICD-10-CM

## 2020-02-22 DIAGNOSIS — Z7901 Long term (current) use of anticoagulants: Secondary | ICD-10-CM | POA: Insufficient documentation

## 2020-02-22 DIAGNOSIS — R079 Chest pain, unspecified: Secondary | ICD-10-CM | POA: Diagnosis not present

## 2020-02-22 DIAGNOSIS — R001 Bradycardia, unspecified: Secondary | ICD-10-CM | POA: Diagnosis not present

## 2020-02-22 DIAGNOSIS — R0789 Other chest pain: Secondary | ICD-10-CM | POA: Diagnosis not present

## 2020-02-22 DIAGNOSIS — Z79899 Other long term (current) drug therapy: Secondary | ICD-10-CM | POA: Diagnosis not present

## 2020-02-22 LAB — CBC
HCT: 39.3 % (ref 36.0–46.0)
Hemoglobin: 12.3 g/dL (ref 12.0–15.0)
MCH: 27.5 pg (ref 26.0–34.0)
MCHC: 31.3 g/dL (ref 30.0–36.0)
MCV: 87.9 fL (ref 80.0–100.0)
Platelets: 146 10*3/uL — ABNORMAL LOW (ref 150–400)
RBC: 4.47 MIL/uL (ref 3.87–5.11)
RDW: 15.7 % — ABNORMAL HIGH (ref 11.5–15.5)
WBC: 6.8 10*3/uL (ref 4.0–10.5)
nRBC: 0 % (ref 0.0–0.2)

## 2020-02-22 LAB — TROPONIN I (HIGH SENSITIVITY)
Troponin I (High Sensitivity): 12 ng/L (ref <18)
Troponin I (High Sensitivity): 11 ng/L (ref <18)

## 2020-02-22 LAB — BASIC METABOLIC PANEL
Anion gap: 9 (ref 5–15)
BUN: 25 mg/dL — ABNORMAL HIGH (ref 8–23)
CO2: 26 mmol/L (ref 22–32)
Calcium: 8.7 mg/dL — ABNORMAL LOW (ref 8.9–10.3)
Chloride: 108 mmol/L (ref 98–111)
Creatinine, Ser: 1.99 mg/dL — ABNORMAL HIGH (ref 0.44–1.00)
GFR calc Af Amer: 25 mL/min — ABNORMAL LOW (ref >60)
GFR calc non Af Amer: 22 mL/min — ABNORMAL LOW (ref >60)
Glucose, Bld: 189 mg/dL — ABNORMAL HIGH (ref 70–99)
Potassium: 4 mmol/L (ref 3.5–5.1)
Sodium: 143 mmol/L (ref 135–145)

## 2020-02-22 MED ORDER — LORAZEPAM 2 MG/ML IJ SOLN
0.5000 mg | Freq: Once | INTRAMUSCULAR | Status: AC
  Administered 2020-02-22: 19:00:00 0.5 mg via INTRAVENOUS
  Filled 2020-02-22: qty 1

## 2020-02-22 MED ORDER — SODIUM CHLORIDE 0.9% FLUSH
3.0000 mL | Freq: Once | INTRAVENOUS | Status: AC
  Administered 2020-02-22: 19:00:00 3 mL via INTRAVENOUS

## 2020-02-22 MED ORDER — MORPHINE SULFATE (PF) 4 MG/ML IV SOLN
4.0000 mg | Freq: Once | INTRAVENOUS | Status: AC
  Administered 2020-02-22: 19:00:00 4 mg via INTRAVENOUS
  Filled 2020-02-22: qty 1

## 2020-02-22 NOTE — Telephone Encounter (Signed)
-----   Message from Jodelle Gross, NP sent at 02/22/2020 12:24 PM EDT ----- Hi, It really depends on what her EKG is showing. If she is in heart block or very slow A. fib and is symptomatic with hypotension along with her dyspnea I would definitely send her to the ED. She has chronic shortness of breath according to my review of her records. With atrial fibrillation she does have a high likelihood of having CHF. She has been on Xarelto. I would check her for anemia as you likely will do anyway. I would not hesitate to have her seen in the ED if you are uncomfortable in any way with her symptoms or EKG.  Hope this helps, Samara Deist ----- Message ----- From: Olive Bass, FNP Sent: 02/22/2020  11:20 AM EDT To: Jodelle Gross, NP  Annabelle Harman-  I hope you have been doing okay and not working too hard.  I wanted to reach out to you about Ms. Gemme. She is in the office with a pulse of 41 and complaining of DOE. She and her daughter are not the best historians. Daughter is saying she has been short of breath for weeks/ patient saying it started over the weekend. I am hearing wheezing as well. No swelling in lower extremities though. We are getting EKG and CXR done here. Would it be best to send her to the ER with that type of pulse or can she be seen in your office today?  Thanks, Ria Clock, FNP

## 2020-02-22 NOTE — Discharge Instructions (Addendum)
Stop taking your lasix for 1 week. I want you to have a repeat basic metabolic panel and follow-up with your cardiologist or PCP.

## 2020-02-22 NOTE — Patient Instructions (Signed)
For the Shingles pain- for the next few days-  Please take an extra dosage of the Lyrica at bedtime ( you can take 2 at the same time).  You need to see your cardiologist about your low pulse rate. I have a call into them and will be in touch.

## 2020-02-22 NOTE — ED Notes (Signed)
Patient verbalizes understanding of discharge instructions. Opportunity for questioning and answers were provided. Armband removed by staff, pt discharged from ED.  

## 2020-02-22 NOTE — Telephone Encounter (Signed)
I spoke with patient's daughter. They understand to take her to [facility] ER for further evaluation.

## 2020-02-22 NOTE — Telephone Encounter (Signed)
I did hear back from her cardiologist; with the extremely low pulse rate and concerning changes in EKG, they feel that ER evaluation is most appropriate place to start today. Will need to go to [facility];

## 2020-02-22 NOTE — Addendum Note (Signed)
Addended by: Iverson Sees J on: 02/22/2020 02:27 PM   Modules accepted: Orders  

## 2020-02-22 NOTE — Progress Notes (Signed)
Alyssa Lindsey is a 86 y.o. female with the following history as recorded in EpicCare:  Patient Active Problem List   Diagnosis Date Noted  . PHN (postherpetic neuralgia) 12/02/2019  . Need for shingles vaccine 12/02/2019  . Herpes zoster without complication 11/28/7251  . Breast pain, left 10/06/2019  . Constipation 11/18/2017  . Adjustment disorder with mixed anxiety and depressed mood 07/06/2016  . Low back pain 05/09/2016  . Numbness and tingling of both legs 04/25/2016  . Bilateral leg edema 04/11/2016  . Routine general medical examination at a health care facility 12/26/2015  . GERD (gastroesophageal reflux disease) 03/02/2015  . Paroxysmal A-fib ([employer]) 03/04/2014  . Right knee pain 03/04/2014  . Chronic LLQ pain 02/07/2012  . B12 deficiency 03/06/2010  . 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 THYROID NODULE 03/03/2008  . BREAST MASS 03/03/2008  . Diabetes type 2, controlled ([place]) 06/21/2007  . Essential hypertension 06/21/2007    Current Outpatient Medications  Medication Sig Dispense Refill  . amLODipine (NORVASC) 10 MG tablet Take 1 tablet (10 mg total) by mouth daily. 90 tablet 3  . augmented betamethasone dipropionate (DIPROLENE-AF) 0.05 % ointment Apply topically 2 (two) times daily. Shingles lesions 60 g 0  . cetirizine (ZYRTEC) 10 MG tablet TAKE ONE TABLET BY MOUTH ONCE DAILY 30 tablet 11  . cyclobenzaprine (FLEXERIL) 5 MG tablet Take 1 tablet (5 mg total) by mouth 3 (three) times daily as needed for muscle spasms. 90 tablet 1  . diclofenac sodium (VOLTAREN) 1 % GEL APPLY TWO GRAMS THREE TIMES DAILY AS NEEDED 100 g 3  . famotidine (PEPCID) 20 MG tablet Take 1 tablet (20 mg total) by mouth 2 (two) times daily. 60 tablet 2  . furosemide (LASIX) 20 MG tablet Take 1 tablet (20 mg total) by mouth daily. 90 tablet 3  . hydrochlorothiazide (MICROZIDE) 12.5 MG capsule Take 1 capsule (12.5 mg total) by mouth daily. 90 capsule 3  . hydrOXYzine (ATARAX/VISTARIL) 10  MG tablet Take 1 tablet by mouth three times daily as needed 30 tablet 2  . ketoconazole (NIZORAL) 2 % cream Apply 1 application topically 2 (two) times daily as needed for irritation. 15 g 0  . losartan (COZAAR) 50 MG tablet Take 1 tablet (50 mg total) by mouth daily. 90 tablet 3  . mirtazapine (REMERON) 15 MG tablet TAKE 1 TABLET BY MOUTH ONCE DAILY AT BEDTIME 90 tablet 0  . nystatin-triamcinolone ointment (MYCOLOG) Apply 1 application topically 2 (two) times daily. 100 g 0  . pramipexole (MIRAPEX) 0.125 MG tablet Take 1 tablet (0.125 mg total) by mouth 2 (two) times daily. 60 tablet 1  . pregabalin (LYRICA) 50 MG capsule Take 1 capsule (50 mg total) by mouth 3 (three) times daily. 90 capsule 3  . Rivaroxaban (XARELTO) 15 MG TABS tablet Take 1 tablet (15 mg total) by mouth daily with supper. 30 tablet 9  . valACYclovir (VALTREX) 1000 MG tablet TAKE 1 TABLET BY MOUTH THREE TIMES DAILY 21 tablet 0   No current facility-administered medications for this visit.    Allergies: Dicyclomine hcl, Lisinopril, and Aspirin  Past Medical History:  Diagnosis Date  . Arthritis   . Chronic neck pain   . Diverticulosis of colon (without mention of hemorrhage)   . Family history of malignant neoplasm of gastrointestinal tract   . Gastritis   . Hiatal hernia   . HTN (hypertension)   . LBP (low back pain)   . Memory problem   . Spondylosis   .  Type II or unspecified type diabetes mellitus without mention of complication, not stated as uncontrolled     Past Surgical History:  Procedure Laterality Date  . COLONOSCOPY  2010   NORMAL-- DUE 2020  . TUBAL LIGATION      Family History  Problem Relation Age of Onset  . Emphysema Brother   . Stroke Father 24  . Ovarian cancer Sister   . Breast cancer Sister   . Liver disease Brother   . Colon cancer Daughter   . Diabetes Daughter   . Throat cancer Brother   . Lung cancer Daughter     Social History   Tobacco Use  . Smoking status: Never Smoker   . Smokeless tobacco: Never Used  Substance Use Topics  . Alcohol use: No    Alcohol/week: 0.0 standard drinks    Subjective:  Accompanied by daughter today; difficult historians- SOB with activity x 1 month ( per daughter)/ per patient, it has just been present for the past few days; no fever;  No cough but has heard occasional wheezing;  Patient is mostly concerned about recurrent pain from PHN- is taking Lyrica 3 x per day but feels that pain is still problematic;    Objective:  Vitals:   02/22/20 1057  BP: (!) 158/84  Pulse: (!) 41  Temp: 98.3 F (36.8 C)  TempSrc: Oral  SpO2: 96%  Weight: 170 lb 9.6 oz (77.4 kg)  Height: _0  (1.651 m)    General: Well developed, well nourished, in no acute distress  Skin : Warm and dry.  Head: Normocephalic and atraumatic  Eyes: Sclera and conjunctiva clear; pupils round and reactive to light; extraocular movements intact  Ears: External normal; canals clear; tympanic membranes normal  Oropharynx: Pink, supple. No suspicious lesions  Neck: Supple without thyromegaly, adenopathy  Lungs: Respirations unlabored;  CVS exam: bradycardia,  regular rhythm, Extremities: No edema, cyanosis, clubbing  Vessels: Symmetric bilaterally  Neurologic: Alert and oriented; speech intact; face symmetrical; moves all extremities well; CNII-XII intact without focal deficit   Assessment:  1. DOE (dyspnea on exertion)   2. Bradycardia   3. PHN (postherpetic neuralgia)     Plan:  STAT CXR shows no pneumonia/ acute changes; however, EKG shows possible junctional bradycardia/ incomplete RBBB; discussed with patient's cardiologist who agreed that ER evaluation is appropriate even though patient is well appearing; will recommend to [facility] ER;  Okay to take an extra dose of Lyrica at bedtime- take 50 mg in the am, mid-day and 100 mg qhs;  This visit occurred during the SARS-CoV-2 public health emergency.  Safety protocols were in place, including screening  questions prior to the visit, additional usage of staff PPE, and extensive cleaning of exam room while observing appropriate contact time as indicated for disinfecting solutions.     No follow-ups on file.  Orders Placed This Encounter  Procedures  . DG Chest 2 View    Order Specific Question:   Reason for Exam (SYMPTOM  OR DIAGNOSIS REQUIRED)    Answer:   doe    Order Specific Question:   Preferred imaging location?    Answer:   [facility]    Order Specific Question:   Radiology Contrast Protocol - do NOT remove file path    Answer:   [email or web address]  . CBC with Differential/Platelet  . Comp Met (CMET)  . B Nat Peptide    Requested Prescriptions    No prescriptions requested or ordered in this encounter

## 2020-02-22 NOTE — Addendum Note (Signed)
Addended by: Karma Ganja on: 02/22/2020 02:27 PM   Modules accepted: Orders

## 2020-02-22 NOTE — ED Provider Notes (Signed)
[facility] EMERGENCY DEPARTMENT Provider Note   CSN: [id number] Arrival date & time: 02/22/20  1400     History Chief Complaint  Patient presents with  . Chest Pain    Alyssa Lindsey is a 86 y.o. female.  HPI   86 year old female with due to her complaints.  Intermittent left chest/breast pain with radiation into her back.  Per review of records, it appears that she has post herpetic neuralgia.  Distribution of her symptoms is consistent with some scarring noted on her anterior chest wall and her mid thoracic back consistent with scarring/resolved shingles.   She is also complaining of dyspnea.  This is been ongoing complaint fairly several months.  She is a been evaluated by her PCP and by cardiology for the same complaint.  During her last cardiology visit her beta-blocker was held.  She does not feel like her symptoms have improved significantly since that time.  She had a follow-up appointment today with her PCP an abnormal EKG was noted.  She was subsequently for to the emergency room for further evaluation.  She denies any fevers or chills.  No cough.  No unusual leg pain or swelling.  Past Medical History:  Diagnosis Date  . Arthritis   . Chronic neck pain   . Diverticulosis of colon (without mention of hemorrhage)   . Family history of malignant neoplasm of gastrointestinal tract   . Gastritis   . Hiatal hernia   . HTN (hypertension)   . LBP (low back pain)   . Memory problem   . Spondylosis   . Type II or unspecified type diabetes mellitus without mention of complication, not stated as uncontrolled    Patient Active Problem List   Diagnosis Date Noted  . PHN (postherpetic neuralgia) 12/02/2019  . Need for shingles vaccine 12/02/2019  . Herpes zoster without complication 10/07/2019  . Breast pain, left 10/06/2019  . Constipation 11/18/2017  . Adjustment disorder with mixed anxiety and depressed mood 07/06/2016  . Low back pain 05/09/2016  .  Numbness and tingling of both legs 04/25/2016  . Bilateral leg edema 04/11/2016  . Routine general medical examination at a health care facility 12/26/2015  . GERD (gastroesophageal reflux disease) 03/02/2015  . Paroxysmal A-fib (HCC) 03/04/2014  . Right knee pain 03/04/2014  . Chronic LLQ pain 02/07/2012  . B12 deficiency 03/06/2010  . 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 THYROID NODULE 03/03/2008  . BREAST MASS 03/03/2008  . Diabetes type 2, controlled (HCC) 06/21/2007  . Essential hypertension 06/21/2007   Past Surgical History:  Procedure Laterality Date  . COLONOSCOPY  2010   NORMAL-- DUE 2020  . TUBAL LIGATION      OB History   No obstetric history on file.    Family History  Problem Relation Age of Onset  . Emphysema Brother   . Stroke Father 66  . Ovarian cancer Sister   . Breast cancer Sister   . Liver disease Brother   . Colon cancer Daughter   . Diabetes Daughter   . Throat cancer Brother   . Lung cancer Daughter    Social History   Tobacco Use  . Smoking status: Never Smoker  . Smokeless tobacco: Never Used  Substance Use Topics  . Alcohol use: No    Alcohol/week: 0.0 standard drinks  . Drug use: No    Home Medications Prior to Admission medications   Medication Sig Start Date End Date Taking? Authorizing Provider  amLODipine (  NORVASC) 10 MG tablet Take 1 tablet (10 mg total) by mouth daily. 12/30/19   Lendon Colonel, NP  augmented betamethasone dipropionate (DIPROLENE-AF) 0.05 % ointment Apply topically 2 (two) times daily. Shingles lesions 10/23/19   Scot Jun, FNP  cetirizine (ZYRTEC) 10 MG tablet TAKE ONE TABLET BY MOUTH ONCE DAILY 10/25/16   Hoyt Koch, MD  cyclobenzaprine (FLEXERIL) 5 MG tablet Take 1 tablet (5 mg total) by mouth 3 (three) times daily as needed for muscle spasms. 01/15/20   Hoyt Koch, MD  diclofenac sodium (VOLTAREN) 1 % GEL APPLY TWO GRAMS THREE TIMES DAILY AS NEEDED 11/18/17    Hoyt Koch, MD  famotidine (PEPCID) 20 MG tablet Take 1 tablet (20 mg total) by mouth 2 (two) times daily. 09/18/19   Hoyt Koch, MD  furosemide (LASIX) 20 MG tablet Take 1 tablet (20 mg total) by mouth daily. 12/30/19 03/29/20  Lendon Colonel, NP  hydrochlorothiazide (MICROZIDE) 12.5 MG capsule Take 1 capsule (12.5 mg total) by mouth daily. 12/30/19 03/29/20  Lendon Colonel, NP  hydrOXYzine (ATARAX/VISTARIL) 10 MG tablet Take 1 tablet by mouth three times daily as needed 12/21/19   Hoyt Koch, MD  ketoconazole (NIZORAL) 2 % cream Apply 1 application topically 2 (two) times daily as needed for irritation. 10/14/19   Biagio Borg, MD  losartan (COZAAR) 50 MG tablet Take 1 tablet (50 mg total) by mouth daily. 12/30/19 03/29/20  Lendon Colonel, NP  mirtazapine (REMERON) 15 MG tablet TAKE 1 TABLET BY MOUTH ONCE DAILY AT BEDTIME 12/14/19   Hoyt Koch, MD  nystatin-triamcinolone ointment [facility]) Apply 1 application topically 2 (two) times daily. 10/06/19   Hoyt Koch, MD  pramipexole (MIRAPEX) 0.125 MG tablet Take 1 tablet (0.125 mg total) by mouth 2 (two) times daily. 08/15/19   Mosie Lukes, MD  pregabalin (LYRICA) 50 MG capsule Take 1 capsule (50 mg total) by mouth 3 (three) times daily. 12/02/19   Janith Lima, MD  Rivaroxaban (XARELTO) 15 MG TABS tablet Take 1 tablet (15 mg total) by mouth daily with supper. 12/30/19   Lendon Colonel, NP  valACYclovir (VALTREX) 1000 MG tablet TAKE 1 TABLET BY MOUTH THREE TIMES DAILY 02/22/20   Hoyt Koch, MD    Allergies    Dicyclomine hcl, Lisinopril, and Aspirin  Review of Systems   Review of Systems All systems reviewed and negative, other than as noted in HPI.  Physical Exam Updated Vital Signs BP (!) 137/53   Pulse (!) 43   Temp 97.8 F (36.6 C) (Oral)   Resp 11   SpO2 97%   Physical Exam Vitals and nursing note reviewed.  Constitutional:      General: She is not in acute  distress.    Appearance: She is well-developed.  HENT:     Head: Normocephalic and atraumatic.  Eyes:     General:        Right eye: No discharge.        Left eye: No discharge.     Conjunctiva/sclera: Conjunctivae normal.  Cardiovascular:     Rate and Rhythm: Regular rhythm. Bradycardia present.     Heart sounds: Normal heart sounds. No murmur. No friction rub. No gallop.   Pulmonary:     Effort: Pulmonary effort is normal. No respiratory distress.     Breath sounds: Normal breath sounds.  Abdominal:     General: There is no distension.     Palpations:  Abdomen is soft.     Tenderness: There is no abdominal tenderness.  Musculoskeletal:        General: No tenderness.     Cervical back: Neck supple.  Skin:    General: Skin is warm and dry.     Comments: Rash/scarring in dermatomal distribution approximately at the left T4/5 distribution  Neurological:     Mental Status: She is alert.  Psychiatric:        Behavior: Behavior normal.        Thought Content: Thought content normal.     ED Results / Procedures / Treatments   Labs (all labs ordered are listed, but only abnormal results are displayed) Labs Reviewed  BASIC METABOLIC PANEL - Abnormal; Notable for the following components:      Result Value   Glucose, Bld 189 (*)    BUN 25 (*)    Creatinine, Ser 1.99 (*)    Calcium 8.7 (*)    GFR calc non Af Amer 22 (*)    GFR calc Af Amer 25 (*)    All other components within normal limits  CBC - Abnormal; Notable for the following components:   RDW 15.7 (*)    Platelets 146 (*)    All other components within normal limits  TROPONIN I (HIGH SENSITIVITY)  TROPONIN I (HIGH SENSITIVITY)    EKG EKG Interpretation  Date/Time:  Wednesday February 24 2020 13:21:37 EDT Ventricular Rate:  83 PR Interval:    QRS Duration: 58 QT Interval:  372 QTC Calculation: 437 R Axis:   47 Text Interpretation: Atrial fibrillation Low voltage QRS Cannot rule out Anterior infarct , age  undetermined ST & T wave abnormality, consider inferolateral ischemia Abnormal ECG No significant change since last tracing Confirmed by Rochele Raring [phone]) on 02/24/2020 4:36:16 PM   Radiology DG Chest 2 View  Result Date: 02/22/2020 CLINICAL DATA:  Dyspnea on exertion for 3 days. EXAM: CHEST - 2 VIEW COMPARISON:  PA and lateral chest 10/26/2019 and 11/15/2015. FINDINGS: The lungs are clear. Heart size is upper normal. No pneumothorax or pleural fluid. No acute or focal bony abnormality. IMPRESSION: No acute disease. Electronically Signed   By: Drusilla Kanner M.D.   On: 02/22/2020 12:07   DG Chest Portable 1 View  Result Date: 02/22/2020 CLINICAL DATA:  Chest pain, bradycardia EXAM: PORTABLE CHEST 1 VIEW COMPARISON:  02/22/2020 FINDINGS: Single frontal view of the chest demonstrates a stable cardiac silhouette. There is chronic central vascular congestion without airspace disease, effusion, or pneumothorax. Chronic left pleural thickening. No acute bony abnormalities. IMPRESSION: 1. Stable exam, no acute process. Electronically Signed   By: Sharlet Salina M.D.   On: 02/22/2020 15:11    Procedures Procedures (including critical care time)  Medications Ordered in ED Medications  sodium chloride flush (NS) 0.9 % injection 3 mL (has no administration in time range)    ED Course  I have reviewed the triage vital signs and the nursing notes.  Pertinent labs & imaging results that were available during my care of the patient were reviewed by me and considered in my medical decision making (see chart for details).    MDM Rules/Calculators/A&P                      86 year old female with complaints of chest/back pain and dyspnea.  I do not feel that these complaints are directly related to each other.  After reviewing her records, it appears that they are actually more  chronic in nature.  The way she describes her pain and her rash is consistent with a post herpetic neuralgia.  She is  currently being treated for this.  Sounds very atypical for ACS.  Her dyspnea has been going on for weeks as well.  She is evaluated by cardiology for this.  Today her exam is pretty unremarkable aside from bradycardia.  She appears comfortable.  Oxygen saturations are normal on room air.  She does not appear to be volume overloaded.  Chest x-ray does not show any acute abnormality.  Her EKG is abnormal but looks fairly similar to one she had a few months ago.  She is not hypotensive. I doubt emergent process. Advised to follow-up with her cardiologist for further eval.  Final Clinical Impression(s) / ED Diagnoses Final diagnoses:  Post herpetic neuralgia  Dyspnea, unspecified type    Rx / DC Orders ED Discharge Orders    None       Raeford Razor, MD 02/24/20 1718

## 2020-02-22 NOTE — ED Triage Notes (Signed)
Pt reports left side chest pain for several days. Denies any sob or cardiac hx. Brady at triage, rate 43. No acute distress is noted.

## 2020-02-22 NOTE — Telephone Encounter (Signed)
Alyssa Lindsey spoke with patient today.

## 2020-02-24 ENCOUNTER — Encounter (HOSPITAL_COMMUNITY): Payer: Self-pay | Admitting: Emergency Medicine

## 2020-02-24 ENCOUNTER — Emergency Department (HOSPITAL_COMMUNITY)
Admission: EM | Admit: 2020-02-24 | Discharge: 2020-02-24 | Disposition: A | Discharge status: Home / Self Care | Payer: Medicare HMO | Attending: Emergency Medicine | Admitting: Emergency Medicine

## 2020-02-24 ENCOUNTER — Other Ambulatory Visit: Payer: Self-pay

## 2020-02-24 DIAGNOSIS — Z7901 Long term (current) use of anticoagulants: Secondary | ICD-10-CM | POA: Diagnosis not present

## 2020-02-24 DIAGNOSIS — R0789 Other chest pain: Secondary | ICD-10-CM

## 2020-02-24 DIAGNOSIS — Z79899 Other long term (current) drug therapy: Secondary | ICD-10-CM | POA: Insufficient documentation

## 2020-02-24 DIAGNOSIS — E119 Type 2 diabetes mellitus without complications: Secondary | ICD-10-CM | POA: Insufficient documentation

## 2020-02-24 DIAGNOSIS — I10 Essential (primary) hypertension: Secondary | ICD-10-CM | POA: Insufficient documentation

## 2020-02-24 DIAGNOSIS — B0229 Other postherpetic nervous system involvement: Secondary | ICD-10-CM | POA: Diagnosis not present

## 2020-02-24 LAB — TROPONIN I (HIGH SENSITIVITY): Troponin I (High Sensitivity): 9 ng/L (ref <18)

## 2020-02-24 LAB — CBC
HCT: 39.4 % (ref 36.0–46.0)
Hemoglobin: 12.1 g/dL (ref 12.0–15.0)
MCH: 26.9 pg (ref 26.0–34.0)
MCHC: 30.7 g/dL (ref 30.0–36.0)
MCV: 87.6 fL (ref 80.0–100.0)
Platelets: 139 10*3/uL — ABNORMAL LOW (ref 150–400)
RBC: 4.5 MIL/uL (ref 3.87–5.11)
RDW: 15.9 % — ABNORMAL HIGH (ref 11.5–15.5)
WBC: 6.2 10*3/uL (ref 4.0–10.5)
nRBC: 0 % (ref 0.0–0.2)

## 2020-02-24 LAB — BASIC METABOLIC PANEL
Anion gap: 11 (ref 5–15)
BUN: 21 mg/dL (ref 8–23)
CO2: 23 mmol/L (ref 22–32)
Calcium: 8.5 mg/dL — ABNORMAL LOW (ref 8.9–10.3)
Chloride: 110 mmol/L (ref 98–111)
Creatinine, Ser: 1.53 mg/dL — ABNORMAL HIGH (ref 0.44–1.00)
GFR calc Af Amer: 35 mL/min — ABNORMAL LOW (ref >60)
GFR calc non Af Amer: 30 mL/min — ABNORMAL LOW (ref >60)
Glucose, Bld: 162 mg/dL — ABNORMAL HIGH (ref 70–99)
Potassium: 3.5 mmol/L (ref 3.5–5.1)
Sodium: 144 mmol/L (ref 135–145)

## 2020-02-24 MED ORDER — OXYCODONE-ACETAMINOPHEN 5-325 MG PO TABS
1.0000 | ORAL_TABLET | Freq: Once | ORAL | Status: AC
  Administered 2020-02-24: 1 via ORAL
  Filled 2020-02-24: qty 1

## 2020-02-24 MED ORDER — DOCUSATE SODIUM 100 MG PO CAPS: 100.0000 mg | ORAL_CAPSULE | Freq: Two times a day (BID) | ORAL | 0 refills | Status: DC

## 2020-02-24 MED ORDER — ONDANSETRON 4 MG PO TBDP: 4.0000 mg | ORAL_TABLET | Freq: Four times a day (QID) | ORAL | 0 refills | Status: DC | PRN

## 2020-02-24 MED ORDER — ONDANSETRON 4 MG PO TBDP
4.0000 mg | ORAL_TABLET | Freq: Once | ORAL | Status: AC
  Administered 2020-02-24: 4 mg via ORAL
  Filled 2020-02-24: qty 1

## 2020-02-24 MED ORDER — LIDOCAINE 5 % EX PTCH
2.0000 | MEDICATED_PATCH | CUTANEOUS | Status: DC
  Administered 2020-02-24: 2 via TRANSDERMAL
  Filled 2020-02-24: qty 2

## 2020-02-24 MED ORDER — SODIUM CHLORIDE 0.9% FLUSH: 3.0000 mL | Freq: Once | INTRAVENOUS | Status: DC

## 2020-02-24 MED ORDER — OXYCODONE-ACETAMINOPHEN 5-325 MG PO TABS: 1.0000 | ORAL_TABLET | ORAL | 0 refills | Status: DC | PRN

## 2020-02-24 NOTE — ED Provider Notes (Signed)
TIME SEEN: 4:59 PM  CHIEF COMPLAINT: Chest soreness and burning  HPI: Patient is an 86 year old female with history of hypertension, diabetes, recent left chest and thoracic shingles in November 2020 who presents to the emergency department with increasing chest soreness, burning pain since November.  States it has been getting worse over the past several days.  No new rash.  No fever.  No injury to her chest or back.  Has chronic shortness of breath which is unchanged.  No fevers, cough, lower extremity swelling or pain.  She is on pregabalin but is not sure if she still has any oxycodone at home.  She was here 2 days ago for the same.  Requesting something else to help with her pain.  ROS: See HPI Constitutional: no fever  Eyes: no drainage  ENT: no runny nose   Cardiovascular:   chest pain  Resp: Chronic shortness of breath GI: no vomiting GU: no dysuria Integumentary: no rash  Allergy: no hives  Musculoskeletal: no leg swelling  Neurological: no slurred speech ROS otherwise negative  PAST MEDICAL HISTORY/PAST SURGICAL HISTORY:  Past Medical History:  Diagnosis Date  . Arthritis   . Chronic neck pain   . Diverticulosis of colon (without mention of hemorrhage)   . Family history of malignant neoplasm of gastrointestinal tract   . Gastritis   . Hiatal hernia   . HTN (hypertension)   . LBP (low back pain)   . Memory problem   . Spondylosis   . Type II or unspecified type diabetes mellitus without mention of complication, not stated as uncontrolled     MEDICATIONS:  Prior to Admission medications   Medication Sig Start Date End Date Taking? Authorizing Provider  amLODipine (NORVASC) 10 MG tablet Take 1 tablet (10 mg total) by mouth daily. 12/30/19   Lendon Colonel, NP  augmented betamethasone dipropionate (DIPROLENE-AF) 0.05 % ointment Apply topically 2 (two) times daily. Shingles lesions Patient not taking: Reported on 02/22/2020 10/23/19   Scot Jun, FNP   cetirizine (ZYRTEC) 10 MG tablet TAKE ONE TABLET BY MOUTH ONCE DAILY 10/25/16   Hoyt Koch, MD  cyclobenzaprine (FLEXERIL) 5 MG tablet Take 1 tablet (5 mg total) by mouth 3 (three) times daily as needed for muscle spasms. 01/15/20   Hoyt Koch, MD  diclofenac sodium (VOLTAREN) 1 % GEL APPLY TWO GRAMS THREE TIMES DAILY AS NEEDED Patient taking differently: Apply 2 g topically 4 (four) times daily as needed (pain). APPLY TWO GRAMS THREE TIMES DAILY AS NEEDED 11/18/17   Hoyt Koch, MD  famotidine (PEPCID) 20 MG tablet Take 1 tablet (20 mg total) by mouth 2 (two) times daily. 09/18/19   Hoyt Koch, MD  furosemide (LASIX) 20 MG tablet Take 1 tablet (20 mg total) by mouth daily. 12/30/19 03/29/20  Lendon Colonel, NP  hydrochlorothiazide (MICROZIDE) 12.5 MG capsule Take 1 capsule (12.5 mg total) by mouth daily. 12/30/19 03/29/20  Lendon Colonel, NP  hydrOXYzine (ATARAX/VISTARIL) 10 MG tablet Take 1 tablet by mouth three times daily as needed Patient taking differently: Take 10 mg by mouth every 8 (eight) hours as needed for itching or anxiety.  12/21/19   Hoyt Koch, MD  ketoconazole (NIZORAL) 2 % cream Apply 1 application topically 2 (two) times daily as needed for irritation. 10/14/19   Biagio Borg, MD  losartan (COZAAR) 50 MG tablet Take 1 tablet (50 mg total) by mouth daily. 12/30/19 03/29/20  Lendon Colonel, NP  mirtazapine (  REMERON) 15 MG tablet TAKE 1 TABLET BY MOUTH ONCE DAILY AT BEDTIME 12/14/19   Myrlene Broker, MD  nystatin-triamcinolone ointment [facility]) Apply 1 application topically 2 (two) times daily. Patient taking differently: Apply 1 application topically 2 (two) times daily as needed (rash).  10/06/19   Myrlene Broker, MD  pramipexole (MIRAPEX) 0.125 MG tablet Take 1 tablet (0.125 mg total) by mouth 2 (two) times daily. 08/15/19   Bradd Canary, MD  pregabalin (LYRICA) 50 MG capsule Take 1 capsule (50 mg total) by  mouth 3 (three) times daily. Patient taking differently: Take 50 mg by mouth 3 (three) times daily as needed (pain).  12/02/19   Etta Grandchild, MD  Rivaroxaban (XARELTO) 15 MG TABS tablet Take 1 tablet (15 mg total) by mouth daily with supper. 12/30/19   Jodelle Gross, NP  valACYclovir (VALTREX) 1000 MG tablet TAKE 1 TABLET BY MOUTH THREE TIMES DAILY 02/22/20   Myrlene Broker, MD    ALLERGIES:  Allergies  Allergen Reactions  . Dicyclomine Hcl Other (See Comments)    Severe eye pain  . Lisinopril Cough  . Aspirin Other (See Comments)    Unknown reaction    SOCIAL HISTORY:  Social History   Tobacco Use  . Smoking status: Never Smoker  . Smokeless tobacco: Never Used  Substance Use Topics  . Alcohol use: No    Alcohol/week: 0.0 standard drinks    FAMILY HISTORY: Family History  Problem Relation Age of Onset  . Emphysema Brother   . Stroke Father 58  . Ovarian cancer Sister   . Breast cancer Sister   . Liver disease Brother   . Colon cancer Daughter   . Diabetes Daughter   . Throat cancer Brother   . Lung cancer Daughter     EXAM: BP 128/78 (BP Location: Left Arm)   Pulse (!) 110   Temp 98.5 F (36.9 C) (Oral)   Resp 16   SpO2 98%  CONSTITUTIONAL: Alert and oriented and responds appropriately to questions.  Elderly.  Appears uncomfortable.  Tearful. HEAD: Normocephalic EYES: Conjunctivae clear, pupils appear equal, EOM appear intact ENT: normal nose; moist mucous membranes NECK: Supple, normal ROM CARD: Irregularly irregular and intermittently tachycardic; S1 and S2 appreciated; no murmurs, no clicks, no rubs, no gallops CHEST: Patient has scars to the upper thoracic paraspinal area on the left side that radiates into the left mid chest and this is the area in which her pain is distributed, area slightly tender to palpation she describes as a soreness, no open wounds or new rash, no redness or warmth, no ecchymosis, no bony deformity RESP: Normal chest  excursion without splinting or tachypnea; breath sounds clear and equal bilaterally; no wheezes, no rhonchi, no rales, no hypoxia or respiratory distress, speaking full sentences ABD/GI: Normal bowel sounds; non-distended; soft, non-tender, no rebound, no guarding, no peritoneal signs, no hepatosplenomegaly BACK:  The back appears normal EXT: Normal ROM in all joints; no deformity noted, no edema; no cyanosis, no calf tenderness or calf swelling SKIN: Normal color for age and race; warm; no rash on exposed skin NEURO: Moves all extremities equally PSYCH: Tearful.  MEDICAL DECISION MAKING: Patient here with chest wall pain likely from postherpetic neuralgia.  In the past 48 hours she has had 3 troponins that have been normal.  Her EKG shows atrial fibrillation but she has a history of and is intermittently tachycardic but suspect this is due to her distress and pain.  No new  ischemic changes.  I do not think that she is having a dissection or PE.  Low suspicion that this is ACS.  Chest x-ray obtained 2 days ago showed no acute process.  Will provide with Lidoderm patches and oxycodone here in the emergency department and then plan will be to reassess.  Daughter drove her here to the emergency department.   ED PROGRESS: 7:05 PM  Pt appears more comfortable.  Heart rate now in the 80s still in atrial fibrillation which is chronic for her.  Daughter now at bedside.  Pain down from a 10/10 now at a 6/10.  Will give second Percocet dose and reassess.  No longer tearful.  8:05 PM  Pt reports pain has significantly improved but still having soreness.  They feel comfortable with plan for discharge home and actually have cardiology follow-up in the morning.  Have encouraged him to keep this appointment although again I suspect that this is post herpetic neuralgia rather than cardiac pain.  Will discharge with Percocet and Lidoderm patches.  Have encouraged her to continue her pregabalin and follow-up with her PCP  for further pain control.   At this time, I do not feel there is any life-threatening condition present. I have reviewed, interpreted and discussed all results (EKG, imaging, lab, urine as appropriate) and exam findings with patient/family. I have reviewed nursing notes and appropriate previous records.  I feel the patient is safe to be discharged home without further emergent workup and can continue workup as an outpatient as needed. Discussed usual and customary return precautions. Patient/family verbalize understanding and are comfortable with this plan.  Outpatient follow-up has been provided as needed. All questions have been answered.   Date: 02/24/2020 13:21   Rate: 83  Rhythm: Atrial fibrillation  QRS Axis: normal  Intervals: normal  ST/T Wave abnormalities: Inferior lateral T wave changes  Conduction Disutrbances: none  Narrative Interpretation: A. fib, inferolateral T wave changes, no change compared to previous EKG     Alyssa Lindsey was evaluated in Emergency Department on 02/24/2020 for the symptoms described in the history of present illness. She was evaluated in the context of the global COVID-19 pandemic, which necessitated consideration that the patient might be at risk for infection with the SARS-CoV-2 virus that causes COVID-19. Institutional protocols and algorithms that pertain to the evaluation of patients at risk for COVID-19 are in a state of rapid change based on information released by regulatory bodies including the CDC and federal and state organizations. These policies and algorithms were followed during the patient's care in the ED.  Patient was seen wearing N95, face shield, gloves.    Alyssa Lindsey, Layla Maw, DO 02/24/20 2010

## 2020-02-24 NOTE — Discharge Instructions (Addendum)
Please continue your pregabalin as prescribed.  Please keep your cardiologist appointment tomorrow as scheduled.  You are being provided a prescription for opiates (also known as narcotics) for pain control.  Opiates can be addictive and should only be used when absolutely necessary for pain control when other alternatives do not work.  We recommend you only use them for the recommended amount of time and only as prescribed.  Please do not take with other sedative medications or alcohol.  Please do not drive, operate machinery, make important decisions while taking opiates.  Please note that these medications can be addictive and have high abuse potential.  Patients can become addicted to narcotics after only taking them for a few days.  Please keep these medications locked away from children, teenagers or any family members with history of substance abuse.  Narcotic pain medicine may also make you constipated.  You may use over-the-counter medications such as MiraLAX, Colace to prevent constipation.  If you become constipated you may use over-the-counter enemas as needed.  Itching and nausea are common side effects of narcotic pain medication.  If you develop uncontrolled vomiting or a rash, please stop these medications.

## 2020-02-24 NOTE — ED Triage Notes (Signed)
Pt was here 2 days ago with chest  Pain but left , pt back to day with c/o chest pain that radiates to her back along with some sob

## 2020-02-25 ENCOUNTER — Encounter: Payer: Self-pay | Admitting: Physician Assistant

## 2020-02-25 ENCOUNTER — Ambulatory Visit: Payer: Medicare HMO | Admitting: Physician Assistant

## 2020-02-25 VITALS — BP 148/86 | HR 60 | Ht 65.0 in | Wt 174.8 lb

## 2020-02-25 DIAGNOSIS — E119 Type 2 diabetes mellitus without complications: Secondary | ICD-10-CM | POA: Diagnosis not present

## 2020-02-25 DIAGNOSIS — I4821 Permanent atrial fibrillation: Secondary | ICD-10-CM | POA: Diagnosis not present

## 2020-02-25 DIAGNOSIS — I5032 Chronic diastolic (congestive) heart failure: Secondary | ICD-10-CM | POA: Diagnosis not present

## 2020-02-25 DIAGNOSIS — I10 Essential (primary) hypertension: Secondary | ICD-10-CM | POA: Diagnosis not present

## 2020-02-25 MED ORDER — FUROSEMIDE 20 MG PO TABS: 20.0000 mg | ORAL_TABLET | ORAL | 3 refills | Status: DC | PRN

## 2020-02-25 NOTE — Patient Instructions (Addendum)
Medication Instructions:   TAKE Lasix as needed for leg swelling  *If you need a refill on your cardiac medications before your next appointment, please call your pharmacy*  Lab Work: NONE ordered at this time of appointment   If you have labs (blood work) drawn today and your tests are completely normal, you will receive your results only by: [name] MyChart Message (if you have MyChart) OR . A paper copy in the mail If you have any lab test that is abnormal or we need to change your treatment, we will call you to review the results.  Testing/Procedures: NONE ordered at this time of appointment   Follow-Up: At [facility], you and your health needs are our priority.  As part of our continuing mission to provide you with exceptional heart care, we have created designated Provider Care Teams.  These Care Teams include your primary Cardiologist (physician) and Advanced Practice Providers (APPs -  Physician Assistants and Nurse Practitioners) who all work together to provide you with the care you need, when you need it.  Your next appointment:   6-8 week(s)  The format for your next appointment:   In Person  Provider:   Rollene Rotunda, MD or Azalee Course, PA-C  Other Instructions

## 2020-02-25 NOTE — Progress Notes (Signed)
Cardiology Office Note:    Date:  02/28/2020   ID:  Alyssa Lindsey, DOB Nov 13, 1929, MRN [id number]  PCP:  Alyssa Broker, MD  Cardiologist:  Rollene Rotunda, MD  Electrophysiologist:  None   Referring MD: Alyssa Lindsey, *   Chief Complaint  Patient presents with  . Follow-up    seen for Dr. Antoine Poche    History of Present Illness:    Alyssa Lindsey is a 86 y.o. female with a hx of HTN, DM II, chronic neck and back pain, chronic diastolic heart failure, and chronic atrial fibrillation on Xarelto.  Beta-blocker was previously discontinued due to bradycardia.  She is on low-dose Lasix for dyspnea.  Patient was last seen by Bailey Mech, NP on 12/30/2019 at which time she was struggling with Neuropathic pain from shingles.  More recently, patient was seen by her PCP on 02/22/2020 who noted she was in possible atrial fibrillation with slow ventricular rate or junctional bradycardia with incomplete right bundle branch block.  This has been discussed with the patient's primary cardiologist and she was sent to the ED. while in the ED, she complained of postherpetic neuralgia.  It was felt her EKG was fairly unchanged when compared to the one she had a few months ago.  Patient returned to the ED on 02/24/2019 with persistent chest pain and was prescribed pain medication including Percocet and Lidoderm patches.  High-sensitivity troponin were negative.  Patient presents today for cardiology office visit.  Despite potential bradycardia, she really does not have any symptom of dizziness, blurred vision or feeling of passing out.  She denies any obvious exertional chest pain.  Her chest pain is associated with recent shingles and is worse with palpation or deep inspiration.  At this time, I did not recommend any change in the therapy.  Past Medical History:  Diagnosis Date  . Arthritis   . Chronic neck pain   . Diverticulosis of colon (without mention of hemorrhage)   . Family  history of malignant neoplasm of gastrointestinal tract   . Gastritis   . Hiatal hernia   . HTN (hypertension)   . LBP (low back pain)   . Memory problem   . Spondylosis   . Type II or unspecified type diabetes mellitus without mention of complication, not stated as uncontrolled     Past Surgical History:  Procedure Laterality Date  . COLONOSCOPY  2010   NORMAL-- DUE 2020  . TUBAL LIGATION      Current Medications: Current Meds  Medication Sig  . amLODipine (NORVASC) 10 MG tablet Take 1 tablet (10 mg total) by mouth daily.  [name] augmented betamethasone dipropionate (DIPROLENE-AF) 0.05 % ointment Apply topically 2 (two) times daily. Shingles lesions  . cetirizine (ZYRTEC) 10 MG tablet TAKE ONE TABLET BY MOUTH ONCE DAILY (Patient taking differently: Take 10 mg by mouth daily. )  . diclofenac sodium (VOLTAREN) 1 % GEL APPLY TWO GRAMS THREE TIMES DAILY AS NEEDED (Patient taking differently: Apply 2 g topically 4 (four) times daily as needed (pain). APPLY TWO GRAMS THREE TIMES DAILY AS NEEDED)  . docusate sodium (COLACE) 100 MG capsule Take 1 capsule (100 mg total) by mouth every 12 (twelve) hours.  . famotidine (PEPCID) 20 MG tablet Take 1 tablet (20 mg total) by mouth 2 (two) times daily. (Patient taking differently: Take 20 mg by mouth as needed for heartburn or indigestion. )  . furosemide (LASIX) 20 MG tablet Take 1 tablet (20 mg total) by mouth as  needed for edema.  . hydrOXYzine (ATARAX/VISTARIL) 10 MG tablet Take 1 tablet by mouth three times daily as needed  . ketoconazole (NIZORAL) 2 % cream Apply 1 application topically 2 (two) times daily as needed for irritation.  [name] losartan (COZAAR) 50 MG tablet Take 1 tablet (50 mg total) by mouth daily.  . mirtazapine (REMERON) 15 MG tablet TAKE 1 TABLET BY MOUTH ONCE DAILY AT BEDTIME (Patient taking differently: Take 15 mg by mouth at bedtime. )  . nystatin-triamcinolone ointment (MYCOLOG) Apply 1 application topically 2 (two) times daily.  .  ondansetron (ZOFRAN ODT) 4 MG disintegrating tablet Take 1 tablet (4 mg total) by mouth every 6 (six) hours as needed.  [name] oxyCODONE-acetaminophen (PERCOCET/ROXICET) 5-325 MG tablet Take 1 tablet by mouth every 4 (four) hours as needed.  . pramipexole (MIRAPEX) 0.125 MG tablet Take 1 tablet (0.125 mg total) by mouth 2 (two) times daily.  . pregabalin (LYRICA) 50 MG capsule Take 1 capsule (50 mg total) by mouth 3 (three) times daily. (Patient taking differently: Take 50 mg by mouth 3 (three) times daily as needed (pain). )  . Rivaroxaban (XARELTO) 15 MG TABS tablet Take 1 tablet (15 mg total) by mouth daily with supper.  . valACYclovir (VALTREX) 1000 MG tablet TAKE 1 TABLET BY MOUTH THREE TIMES DAILY (Patient taking differently: Take 1,000 mg by mouth 3 (three) times daily. )  . [DISCONTINUED] furosemide (LASIX) 20 MG tablet Take 1 tablet (20 mg total) by mouth daily.     Allergies:   Dicyclomine hcl, Lisinopril, and Aspirin   Social History   Socioeconomic History  . Marital status: Widowed    Spouse name: Not on file  . Number of children: 4  . Years of education: Not on file  . Highest education level: Not on file  Occupational History  . Occupation: Retired Advertising copywriter  Tobacco Use  . Smoking status: Never Smoker  . Smokeless tobacco: Never Used  Substance and Sexual Activity  . Alcohol use: No    Alcohol/week: 0.0 standard drinks  . Drug use: No  . Sexual activity: Not on file  Other Topics Concern  . Not on file  Social History Narrative   Widow    4 children: 1 son '59; 3 dtrs '53, '62, '64; 9 grandchildren; great-grand   Lives with dtr at home, grandson at home      Daily Caffeine Use:  1 cup   Regular Exercise -  NO         Social Determinants of Health   Financial Resource Strain:   . Difficulty of Paying Living Expenses:   Food Insecurity:   . Worried About Programme researcher, broadcasting/film/video in the Last Year:   . Barista in the Last Year:   Transportation Needs:   .  Freight forwarder (Medical):   [name] Lack of Transportation (Non-Medical):   Physical Activity:   . Days of Exercise per Week:   . Minutes of Exercise per Session:   Stress:   . Feeling of Stress :   Social Connections:   . Frequency of Communication with Friends and Family:   . Frequency of Social Gatherings with Friends and Family:   . Attends Religious Services:   . Active Member of Clubs or Organizations:   . Attends Banker Meetings:   [name] Marital Status:      Family History: The patient's family history includes Breast cancer in her sister; Colon cancer in her daughter; Diabetes  in her daughter; Emphysema in her brother; Liver disease in her brother; Lung cancer in her daughter; Ovarian cancer in her sister; Stroke (age of onset: 58) in her father; Throat cancer in her brother.  ROS:   Please see the history of present illness.     All other systems reviewed and are negative.  EKGs/Labs/Other Studies Reviewed:    The following studies were reviewed today:  N/A  EKG:  EKG is not ordered today.    Recent Labs: 09/11/2019: ALT 13 10/29/2019: BNP 868.7; TSH 1.350 02/24/2020: BUN 21; Creatinine, Ser 1.53; Hemoglobin 12.1; Platelets 139; Potassium 3.5; Sodium 144  Recent Lipid Panel    Component Value Date/Time   CHOL 144 09/11/2019 0855   TRIG 125.0 09/11/2019 0855   HDL 40.70 09/11/2019 0855   CHOLHDL 4 09/11/2019 0855   VLDL 25.0 09/11/2019 0855   LDLCALC 79 09/11/2019 0855    Physical Exam:    VS:  BP (!) 148/86   Pulse 60   Ht 5\' 5"  (1.651 m)   Wt 174 lb 12.8 oz (79.3 kg)   SpO2 95%   BMI 29.09 kg/m     Wt Readings from Last 3 Encounters:  02/25/20 174 lb 12.8 oz (79.3 kg)  02/24/20 165 lb (74.8 kg)  02/22/20 170 lb 9.6 oz (77.4 kg)     GEN:  Well nourished, well developed in no acute distress HEENT: Normal NECK: No JVD; No carotid bruits LYMPHATICS: No lymphadenopathy CARDIAC: RRR, no murmurs, rubs, gallops RESPIRATORY:  Clear to  auscultation without rales, wheezing or rhonchi  ABDOMEN: Soft, non-tender, non-distended MUSCULOSKELETAL:  No edema; No deformity  SKIN: Warm and dry NEUROLOGIC:  Alert and oriented x 3 PSYCHIATRIC:  Normal affect   ASSESSMENT:    1. Permanent atrial fibrillation ([place])   2. Chronic diastolic heart failure ([place])   3. Essential hypertension   4. Controlled type 2 diabetes mellitus without complication, without long-term current use of insulin (HCC)    PLAN:    In order of problems listed above:  1. Permanent atrial fibrillation: On Xarelto.  Rate controlled on the current therapy.  Patient denies any recent symptom associated with bradycardia such as dizziness, blurred vision or feeling of passing out.  I recommended continue on the current therapy.  Although I would be interested to obtain a 3-day heart monitor on this patient, however she has very painful scar as result of recent shingles and I do not think we can apply the patches at this time.  I recommended to see the patient back in about a month or so and if she does have any recurrence of symptoms we can consider a heart monitor then.  2. Chronic diastolic heart failure: Euvolemic on exam  3. Hypertension: Blood pressure mildly elevated.  I recommended continue on the current therapy  4. DM2: Managed by primary care provider   Medication Adjustments/Labs and Tests Ordered: Current medicines are reviewed at length with the patient today.  Concerns regarding medicines are outlined above.  No orders of the defined types were placed in this encounter.  Meds ordered this encounter  Medications  . furosemide (LASIX) 20 MG tablet    Sig: Take 1 tablet (20 mg total) by mouth as needed for edema.    Dispense:  90 tablet    Refill:  3    Patient Instructions  Medication Instructions:   TAKE Lasix as needed for leg swelling  *If you need a refill on your cardiac medications before your  next appointment, please call your  pharmacy*  Lab Work: NONE ordered at this time of appointment   If you have labs (blood work) drawn today and your tests are completely normal, you will receive your results only by: [name] MyChart Message (if you have MyChart) OR . A paper copy in the mail If you have any lab test that is abnormal or we need to change your treatment, we will call you to review the results.  Testing/Procedures: NONE ordered at this time of appointment   Follow-Up: At [facility], you and your health needs are our priority.  As part of our continuing mission to provide you with exceptional heart care, we have created designated Provider Care Teams.  These Care Teams include your primary Cardiologist (physician) and Advanced Practice Providers (APPs -  Physician Assistants and Nurse Practitioners) who all work together to provide you with the care you need, when you need it.  Your next appointment:   6-8 week(s)  The format for your next appointment:   In Person  Provider:   Rollene Rotunda, MD or Azalee Course, PA-C  Other Instructions      Signed, Azalee Course, [place]  02/28/2020 12:04 AM    [facility]

## 2020-02-28 ENCOUNTER — Encounter: Payer: Self-pay | Admitting: Physician Assistant

## 2020-03-15 LAB — NOVEL CORONAVIRUS, NAA

## 2020-03-18 ENCOUNTER — Encounter: Payer: Self-pay | Admitting: Internal Medicine

## 2020-03-22 ENCOUNTER — Other Ambulatory Visit: Payer: Self-pay | Admitting: Internal Medicine

## 2020-03-24 ENCOUNTER — Telehealth: Payer: Self-pay | Admitting: Internal Medicine

## 2020-03-24 DIAGNOSIS — I281 Aneurysm of pulmonary artery: Secondary | ICD-10-CM

## 2020-03-24 NOTE — Progress Notes (Signed)
  Chronic Care Management   Outreach Note  03/24/2020 Name: DWAYNE BEGAY MRN: [id number] DOB: 19-Aug-1929  Referred by: Myrlene Broker, MD Reason for referral : No chief complaint on file.   An unsuccessful telephone outreach was attempted today. The patient was referred to the pharmacist for assistance with care management and care coordination.    This note is not being shared with the patient for the following reason: To respect privacy (The patient or proxy has requested that the information not be shared).  Follow Up Plan:   Raynicia Dukes [employer] Scheduler

## 2020-03-24 NOTE — Chronic Care Management (AMB) (Signed)
  Chronic Care Management   Note  03/24/2020 Name: Alyssa Lindsey MRN: [id number] DOB: 25-Oct-1929  Alyssa Lindsey is a 86 y.o. year old female who is a primary care patient of Myrlene Broker, MD. I reached out to [name] by phone today in response to a referral sent by Ms. Willis Modena Alvelo's PCP, Myrlene Broker, MD.   Ms. Linn was given information about Chronic Care Management services today including:  1. CCM service includes personalized support from designated clinical staff supervised by her physician, including individualized plan of care and coordination with other care providers 2. 24/7 contact phone numbers for assistance for urgent and routine care needs. 3. Service will only be billed when office clinical staff spend 20 minutes or more in a month to coordinate care. 4. Only one practitioner may furnish and bill the service in a calendar month. 5. The patient may stop CCM services at any time (effective at the end of the month) by phone call to the office staff.   Patient agreed to services and verbal consent obtained.    This note is not being shared with the patient for the following reason: To respect privacy (The patient or proxy has requested that the information not be shared).  Follow up plan:   Raynicia Dukes [employer] Scheduler

## 2020-03-28 ENCOUNTER — Telehealth: Payer: Self-pay | Admitting: Internal Medicine

## 2020-03-28 DIAGNOSIS — B0229 Other postherpetic nervous system involvement: Secondary | ICD-10-CM

## 2020-03-28 NOTE — Telephone Encounter (Signed)
Please advise 

## 2020-03-28 NOTE — Telephone Encounter (Signed)
   Patients daughter calling to request a referral for nerve block for pain relief of shingles. Please advise

## 2020-03-28 NOTE — Telephone Encounter (Signed)
I will put in order to pain management for her; we can not specifically order a nerve block.

## 2020-03-28 NOTE — Telephone Encounter (Signed)
Called and spoke with patients daughter about referral

## 2020-04-04 ENCOUNTER — Encounter: Payer: Self-pay | Admitting: Internal Medicine

## 2020-04-04 MED ORDER — OXYCODONE-ACETAMINOPHEN 5-325 MG PO TABS: 1.0000 | ORAL_TABLET | ORAL | 0 refills | Status: DC | PRN

## 2020-04-04 NOTE — Telephone Encounter (Signed)
Done erx 

## 2020-04-12 DIAGNOSIS — R0609 Other forms of dyspnea: Secondary | ICD-10-CM | POA: Insufficient documentation

## 2020-04-12 DIAGNOSIS — R0600 Dyspnea, unspecified: Secondary | ICD-10-CM | POA: Insufficient documentation

## 2020-04-12 DIAGNOSIS — Z7189 Other specified counseling: Secondary | ICD-10-CM | POA: Insufficient documentation

## 2020-04-12 DIAGNOSIS — I4821 Permanent atrial fibrillation: Secondary | ICD-10-CM | POA: Insufficient documentation

## 2020-04-12 NOTE — Progress Notes (Signed)
Cardiology Office Note   Date:  04/13/2020   ID:  Alyssa Lindsey, DOB 07-24-1929, MRN [id number]  PCP:  Hoyt Koch, MD  Cardiologist:   Minus Breeding, MD   Chief Complaint  Patient presents with  . Shortness of Breath      History of Present Illness: Alyssa Lindsey is a 86 y.o. female who presents for follow up of atrial fib.   She was in the ED in April with slow ventricular rate/junctional rhythm.   She has been having lots of pain related to shingles.  She has lots of tenderness around in her chest where she has the residual scars from the rash.  Most recently she is been getting short of breath.  She says that this is chronic but it seems to be slowly progressive.  She is not describing PND or orthopnea.  She is not describing substernal chest pressure, neck or arm discomfort.  She does not notice her atrial fibrillation.  She is not having any presyncope or syncope.  However, she is not short of breath walking a level distance through her house.  She has had some increased lower extremity swelling.  Over the last couple of days she started coughing up some blood and I did see this today.  She has not had any fevers or chills.  She had a Covid vaccine.  Past Medical History:  Diagnosis Date  . Arthritis   . Chronic neck pain   . Diverticulosis of colon (without mention of hemorrhage)   . Family history of malignant neoplasm of gastrointestinal tract   . Gastritis   . Hiatal hernia   . HTN (hypertension)   . LBP (low back pain)   . Memory problem   . Spondylosis   . Type II or unspecified type diabetes mellitus without mention of complication, not stated as uncontrolled     Past Surgical History:  Procedure Laterality Date  . COLONOSCOPY  2010   NORMAL-- DUE 2020  . TUBAL LIGATION       Current Outpatient Medications  Medication Sig Dispense Refill  . amLODipine (NORVASC) 10 MG tablet Take 1 tablet (10 mg total) by mouth daily. 90 tablet 3  .  cetirizine (ZYRTEC) 10 MG tablet TAKE ONE TABLET BY MOUTH ONCE DAILY (Patient taking differently: Take 10 mg by mouth daily. ) 30 tablet 11  . cyclobenzaprine (FLEXERIL) 5 MG tablet Take 1 tablet (5 mg total) by mouth 3 (three) times daily as needed for muscle spasms. 90 tablet 1  . famotidine (PEPCID) 20 MG tablet Take 1 tablet (20 mg total) by mouth 2 (two) times daily. (Patient taking differently: Take 20 mg by mouth as needed for heartburn or indigestion. ) 60 tablet 2  . furosemide (LASIX) 20 MG tablet Take 1 tablet (20 mg total) by mouth as needed for edema. 90 tablet 3  . mirtazapine (REMERON) 15 MG tablet TAKE 1 TABLET BY MOUTH ONCE DAILY AT BEDTIME 90 tablet 0  . pregabalin (LYRICA) 50 MG capsule Take 1 capsule (50 mg total) by mouth 3 (three) times daily. (Patient taking differently: Take 50 mg by mouth 3 (three) times daily as needed (pain). ) 90 capsule 3  . Rivaroxaban (XARELTO) 15 MG TABS tablet Take 1 tablet (15 mg total) by mouth daily with supper. 30 tablet 9  . hydrochlorothiazide (MICROZIDE) 12.5 MG capsule Take 1 capsule (12.5 mg total) by mouth daily. (Patient not taking: Reported on 02/25/2020) 90 capsule 3  No current facility-administered medications for this visit.    Allergies:   Dicyclomine hcl, Lisinopril, and Aspirin    ROS:  Please see the history of present illness.   Otherwise, review of systems are positive for none.   All other systems are reviewed and negative.    PHYSICAL EXAM: VS:  BP 135/70   Pulse 60   Temp (!) 94.1 F (34.5 C)   Ht 5\' 5"  (1.651 m)   Wt 173 lb (78.5 kg)   SpO2 95%   BMI 28.79 kg/m  , BMI Body mass index is 28.79 kg/m. GENERAL:  Well appearing NECK:  No jugular venous distention, waveform within normal limits, carotid upstroke brisk and symmetric, no bruits, no thyromegaly LUNGS: Diffuse bilateral crackles CHEST:  Unremarkable HEART:  PMI not displaced or sustained,S1 and S2 within normal limits, no S3, no clicks, no rubs, no  murmurs, irregular ABD:  Flat, positive bowel sounds normal in frequency in pitch, no bruits, no rebound, no guarding, no midline pulsatile mass, no hepatomegaly, no splenomegaly EXT:  2 plus pulses throughout, mild ankle edema, no cyanosis no clubbing   EKG:  EKG is ordered today. The ekg ordered today demonstrates atrial fibrillation, rate 59, axis within normal limits, intervals within normal limits, no acute ST-T wave changes.   Recent Labs: 09/11/2019: ALT 13 10/29/2019: BNP 868.7; TSH 1.350 02/24/2020: BUN 21; Creatinine, Ser 1.53; Hemoglobin 12.1; Platelets 139; Potassium 3.5; Sodium 144    Lipid Panel    Component Value Date/Time   CHOL 144 09/11/2019 0855   TRIG 125.0 09/11/2019 0855   HDL 40.70 09/11/2019 0855   CHOLHDL 4 09/11/2019 0855   VLDL 25.0 09/11/2019 0855   LDLCALC 79 09/11/2019 0855      Wt Readings from Last 3 Encounters:  04/13/20 173 lb (78.5 kg)  02/25/20 174 lb 12.8 oz (79.3 kg)  02/24/20 165 lb (74.8 kg)      Other studies Reviewed: Additional studies/ records that were reviewed today include: ED records. Review of the above records demonstrates:  Please see elsewhere in the note.     ASSESSMENT AND PLAN:  ATRIAL FIB:Ms.Alyssa B Curtishas a CHA2DS2 - VASc score of 5.  She seems to tolerate this rhythm.  Her rate is not particularly slow.  For now getting continue the anticoagulation.  No change in therapy other than as needed below.   HTN: Her BP is  at target.  No change in therapy.   SOB:  She does seem to have a change in her exam.  There was no evidence of airspace disease on her x-ray a couple of months ago.  I am going to repeat an x-ray.  I am going to check a BNP level.  I am going to check a CBC and a basic metabolic profile.  She will also need an echocardiogram.  For now getting continue the current dose of diuretic but might need to increase this.  We will have to be careful because she does have known renal insufficiency.    HEMOPTYSIS: This will be evaluated as above.  She may need further imaging such as a CT if I can find another source.  COVID EDUCATION: She has had her vaccine.    Current medicines are reviewed at length with the patient today.  The patient does not have concerns regarding medicines.  The following changes have been made:  no change  Labs/ tests ordered today include:   Orders Placed This Encounter  Procedures  .  DG Chest 2 View  . Basic metabolic panel  . CBC  . Brain natriuretic peptide  . EKG 12-Lead  . ECHOCARDIOGRAM COMPLETE     Disposition:   FU with me or Azalee Course PAC in one week.     Signed, Rollene Rotunda, MD  04/13/2020 1:06 PM    [facility]

## 2020-04-13 ENCOUNTER — Ambulatory Visit: Payer: Medicare HMO | Admitting: Cardiology

## 2020-04-13 ENCOUNTER — Encounter: Payer: Self-pay | Admitting: Cardiology

## 2020-04-13 ENCOUNTER — Ambulatory Visit
Admission: RE | Admit: 2020-04-13 | Discharge: 2020-04-13 | Disposition: A | Discharge status: Home / Self Care | Payer: Medicare HMO | Source: Ambulatory Visit | Attending: Cardiology | Admitting: Cardiology

## 2020-04-13 ENCOUNTER — Other Ambulatory Visit: Payer: Self-pay

## 2020-04-13 VITALS — BP 135/70 | HR 60 | Temp 94.1°F | Ht 65.0 in | Wt 173.0 lb

## 2020-04-13 DIAGNOSIS — R0602 Shortness of breath: Secondary | ICD-10-CM

## 2020-04-13 DIAGNOSIS — R079 Chest pain, unspecified: Secondary | ICD-10-CM | POA: Diagnosis not present

## 2020-04-13 DIAGNOSIS — Z7189 Other specified counseling: Secondary | ICD-10-CM | POA: Diagnosis not present

## 2020-04-13 DIAGNOSIS — I10 Essential (primary) hypertension: Secondary | ICD-10-CM

## 2020-04-13 DIAGNOSIS — I4821 Permanent atrial fibrillation: Secondary | ICD-10-CM

## 2020-04-13 DIAGNOSIS — R0600 Dyspnea, unspecified: Secondary | ICD-10-CM | POA: Diagnosis not present

## 2020-04-13 NOTE — Patient Instructions (Signed)
Medication Instructions:  NO CHANGES *If you need a refill on your cardiac medications before your next appointment, please call your pharmacy*  Lab Work: Your physician recommends that you return for lab work today (BNP, BMP, CBC) If you have labs (blood work) drawn today and your tests are completely normal, you will receive your results only by: [name] MyChart Message (if you have MyChart) OR . A paper copy in the mail If you have any lab test that is abnormal or we need to change your treatment, we will call you to review the results.  Testing/Procedures: A chest x-ray takes a picture of the organs and structures inside the chest, including the heart, lungs, and blood vessels. This test can show several things, including, whether the heart is enlarges; whether fluid is building up in the lungs; and whether pacemaker / defibrillator leads are still in place. [facility] [address]  Your physician has requested that you have an echocardiogram. Echocardiography is a painless test that uses sound waves to create images of your heart. It provides your doctor with information about the size and shape of your heart and how well your heart's chambers and valves are working. This procedure takes approximately one hour. There are no restrictions for this procedure. [address]  Follow-Up: At [facility], you and your health needs are our priority.  As part of our continuing mission to provide you with exceptional heart care, we have created designated Provider Care Teams.  These Care Teams include your primary Cardiologist (physician) and Advanced Practice Providers (APPs -  Physician Assistants and Nurse Practitioners) who all work together to provide you with the care you need, when you need it.  Your next appointment:   1 week(s)  The format for your next appointment:   In Person  Provider:   You may see Rollene Rotunda, MD or the following Advanced Practice  Provider on your designated Care Team:    Azalee Course, [place]

## 2020-04-14 LAB — BASIC METABOLIC PANEL
BUN/Creatinine Ratio: 15 (ref 12–28)
BUN: 23 mg/dL (ref 8–27)
CO2: 25 mmol/L (ref 20–29)
Calcium: 9.4 mg/dL (ref 8.7–10.3)
Chloride: 107 mmol/L — ABNORMAL HIGH (ref 96–106)
Creatinine, Ser: 1.56 mg/dL — ABNORMAL HIGH (ref 0.57–1.00)
GFR calc Af Amer: 34 mL/min/{1.73_m2} — ABNORMAL LOW (ref >59)
GFR calc non Af Amer: 29 mL/min/{1.73_m2} — ABNORMAL LOW (ref >59)
Glucose: 91 mg/dL (ref 65–99)
Potassium: 4.6 mmol/L (ref 3.5–5.2)
Sodium: 145 mmol/L — ABNORMAL HIGH (ref 134–144)

## 2020-04-14 LAB — CBC
Hematocrit: 40.8 % (ref 34.0–46.6)
Hemoglobin: 13.2 g/dL (ref 11.1–15.9)
MCH: 27.1 pg (ref 26.6–33.0)
MCHC: 32.4 g/dL (ref 31.5–35.7)
MCV: 84 fL (ref 79–97)
Platelets: 69 10*3/uL — CL (ref 150–450)
RBC: 4.87 x10E6/uL (ref 3.77–5.28)
RDW: 15.8 % — ABNORMAL HIGH (ref 11.7–15.4)
WBC: 5.5 10*3/uL (ref 3.4–10.8)

## 2020-04-14 LAB — BRAIN NATRIURETIC PEPTIDE: BNP: 411.1 pg/mL — ABNORMAL HIGH (ref 0.0–100.0)

## 2020-04-15 ENCOUNTER — Other Ambulatory Visit: Payer: Self-pay

## 2020-04-15 DIAGNOSIS — D696 Thrombocytopenia, unspecified: Secondary | ICD-10-CM

## 2020-04-15 DIAGNOSIS — R0600 Dyspnea, unspecified: Secondary | ICD-10-CM

## 2020-04-18 ENCOUNTER — Ambulatory Visit (INDEPENDENT_AMBULATORY_CARE_PROVIDER_SITE_OTHER): Payer: Medicare HMO | Admitting: Internal Medicine

## 2020-04-18 ENCOUNTER — Ambulatory Visit: Payer: Medicare HMO | Admitting: Internal Medicine

## 2020-04-18 ENCOUNTER — Other Ambulatory Visit: Payer: Self-pay

## 2020-04-18 ENCOUNTER — Encounter: Payer: Self-pay | Admitting: Internal Medicine

## 2020-04-18 ENCOUNTER — Ambulatory Visit (HOSPITAL_COMMUNITY): Payer: Medicare HMO | Attending: Cardiology

## 2020-04-18 VITALS — BP 142/76 | HR 80 | Temp 98.0°F | Ht 65.0 in | Wt 167.8 lb

## 2020-04-18 DIAGNOSIS — B0229 Other postherpetic nervous system involvement: Secondary | ICD-10-CM | POA: Diagnosis not present

## 2020-04-18 DIAGNOSIS — D696 Thrombocytopenia, unspecified: Secondary | ICD-10-CM

## 2020-04-18 DIAGNOSIS — R042 Hemoptysis: Secondary | ICD-10-CM

## 2020-04-18 DIAGNOSIS — R0602 Shortness of breath: Secondary | ICD-10-CM

## 2020-04-18 LAB — CBC WITH DIFFERENTIAL/PLATELET
Basophils Absolute: 0 10*3/uL (ref 0.0–0.1)
Basophils Relative: 0.5 % (ref 0.0–3.0)
Eosinophils Absolute: 0.4 10*3/uL (ref 0.0–0.7)
Eosinophils Relative: 7.6 % — ABNORMAL HIGH (ref 0.0–5.0)
HCT: 42.1 % (ref 36.0–46.0)
Hemoglobin: 13.5 g/dL (ref 12.0–15.0)
Lymphocytes Relative: 30.5 % (ref 12.0–46.0)
Lymphs Abs: 1.7 10*3/uL (ref 0.7–4.0)
MCHC: 32.1 g/dL (ref 30.0–36.0)
MCV: 86.8 fl (ref 78.0–100.0)
Monocytes Absolute: 0.8 10*3/uL (ref 0.1–1.0)
Monocytes Relative: 15.6 % — ABNORMAL HIGH (ref 3.0–12.0)
Neutro Abs: 2.5 10*3/uL (ref 1.4–7.7)
Neutrophils Relative %: 45.8 % (ref 43.0–77.0)
Platelets: 149 10*3/uL — ABNORMAL LOW (ref 150.0–400.0)
RBC: 4.85 Mil/uL (ref 3.87–5.11)
RDW: 17.1 % — ABNORMAL HIGH (ref 11.5–15.5)
WBC: 5.5 10*3/uL (ref 4.0–10.5)

## 2020-04-18 LAB — COMPREHENSIVE METABOLIC PANEL
ALT: 10 U/L (ref 0–35)
AST: 16 U/L (ref 0–37)
Albumin: 4.2 g/dL (ref 3.5–5.2)
Alkaline Phosphatase: 74 U/L (ref 39–117)
BUN: 24 mg/dL — ABNORMAL HIGH (ref 6–23)
CO2: 28 mEq/L (ref 19–32)
Calcium: 9 mg/dL (ref 8.4–10.5)
Chloride: 103 mEq/L (ref 96–112)
Creatinine, Ser: 1.83 mg/dL — ABNORMAL HIGH (ref 0.40–1.20)
GFR: 31.45 mL/min — ABNORMAL LOW (ref >60.00)
Glucose, Bld: 176 mg/dL — ABNORMAL HIGH (ref 70–99)
Potassium: 3.6 mEq/L (ref 3.5–5.1)
Sodium: 140 mEq/L (ref 135–145)
Total Bilirubin: 0.6 mg/dL (ref 0.2–1.2)
Total Protein: 7.3 g/dL (ref 6.0–8.3)

## 2020-04-18 NOTE — Assessment & Plan Note (Signed)
Likely traumatic from coughing, xarelto, low platelets. Rechecking CMP, CBC with diff today. CXR unremarkable. Awaiting CT ordered by cardiology for any lung changes. This has since resolved. The amount was not more than drops in sputum a few times over the course of a week.

## 2020-04-18 NOTE — Progress Notes (Signed)
   Subjective:   Patient ID: Alyssa Lindsey, female    DOB: 1929/11/17, 86 y.o.   MRN: [id number]  HPI The patient is an 86 YO female coming in for several concerns including low platelet levels (have been mildly low in the past, down to 69 on recent labs with cardiology, she has had drops of blood in sputum about 1 week ago, currently resolved, denies excessive bruising or bleeding, denies blood in stool, most recent new medication is lyrica started back in January for post herpetic neuralgia) and blood in sputum (happened about 1-2 weeks ago, happened a couple of times, with drop of blood in sputum, not consistent with coughing, denies coughing up blood clot or more than a teaspoon of blood total, denies current cough or symptoms, denies weight loss, denies change in SOB although since not doing as much with her shingles pain she does get SOB with activity, cardiology saw her and ordered CXR which was normal and then CT chest which she has not gotten yet, new low platelets as well recently) and post-herpetic neuralgia (still having a lot of pain, has tried many different things including opioids, flexeril, lyrica, she is currently taking lyrica 50 mg qhs which is not helping, she denies side effects with it, is going to see pain management in the next week, pain is severe upper left back and left upper torso and left breast, cardiac workup not revealing for cardiac source, rash is better but some scarring from this, denies any improvement since onset).   Review of Systems  Constitutional: Negative.   HENT: Negative.   Eyes: Negative.   Respiratory: Positive for shortness of breath. Negative for cough and chest tightness.        Blood in sputum  Cardiovascular: Negative for chest pain, palpitations and leg swelling.  Gastrointestinal: Negative for abdominal distention, abdominal pain, constipation, diarrhea, nausea and vomiting.  Musculoskeletal: Negative.   Skin: Negative.   Neurological:  Negative.   Psychiatric/Behavioral: Negative.     Objective:  Physical Exam Constitutional:      Appearance: She is well-developed.  HENT:     Head: Normocephalic and atraumatic.  Cardiovascular:     Rate and Rhythm: Normal rate. Rhythm irregular.  Pulmonary:     Effort: Pulmonary effort is normal. No respiratory distress.     Breath sounds: Normal breath sounds. No wheezing or rales.  Abdominal:     General: Bowel sounds are normal. There is no distension.     Palpations: Abdomen is soft.     Tenderness: There is no abdominal tenderness. There is no rebound.  Musculoskeletal:     Cervical back: Normal range of motion.  Skin:    General: Skin is warm and dry.  Neurological:     Mental Status: She is alert and oriented to person, place, and time.     Coordination: Coordination normal.     Vitals:   04/18/20 1320  BP: (!) 142/76  Pulse: 80  Temp: 98 F (36.7 C)  SpO2: 99%  Weight: 167 lb 12.8 oz (76.1 kg)  Height: 5\' 5"  (1.651 m)    This visit occurred during the SARS-CoV-2 public health emergency.  Safety protocols were in place, including screening questions prior to the visit, additional usage of staff PPE, and extensive cleaning of exam room while observing appropriate contact time as indicated for disinfecting solutions.   Assessment & Plan:

## 2020-04-18 NOTE — Assessment & Plan Note (Signed)
Will increase lyrica to 100 mg BID to see if this helps with pain. Advised her to continue with visit with pain medicine to see if they can do a nerve block to help with pain. Counseled that we do not have a way of telling if this will improve or when. If no relief with change in dosing lyrica can add or change to another agent.

## 2020-04-18 NOTE — Patient Instructions (Signed)
We will check the labs today to see if the platelet levels are the same, better, or worse.  We will have you talk to a blood specialist if the platelet levels are still low.  For the lyrica (pregabalin) you can take 2 pills in the evening and 2 pills in the morning to see if this helps with the pain better.

## 2020-04-18 NOTE — Assessment & Plan Note (Signed)
Many possible etiologies including ITP, post-viral (although shingles outbreak was November initially), not on any heparin, spurious results. Will check cbc with diff and pathologist review of smear. She does not have drop in other cell lines making leukemia less likely. She is awaiting CT chest ordered by cardiology to rule out lung changes. She is on xarelto chronically but amount of blood in sputum not likely to have caused drop. If true drop in platelets needs evaluation for other causes including infection. Cardiology has done referral to hematology and fine with this.

## 2020-04-19 ENCOUNTER — Encounter: Payer: Self-pay | Admitting: Internal Medicine

## 2020-04-19 LAB — PATHOLOGIST SMEAR REVIEW

## 2020-04-20 DIAGNOSIS — N1832 Chronic kidney disease, stage 3b: Secondary | ICD-10-CM | POA: Insufficient documentation

## 2020-04-20 DIAGNOSIS — I482 Chronic atrial fibrillation, unspecified: Secondary | ICD-10-CM | POA: Insufficient documentation

## 2020-04-20 DIAGNOSIS — G894 Chronic pain syndrome: Secondary | ICD-10-CM | POA: Diagnosis not present

## 2020-04-20 DIAGNOSIS — R0789 Other chest pain: Secondary | ICD-10-CM | POA: Diagnosis not present

## 2020-04-20 DIAGNOSIS — B0229 Other postherpetic nervous system involvement: Secondary | ICD-10-CM | POA: Diagnosis not present

## 2020-04-20 NOTE — Progress Notes (Signed)
Cardiology Office Note   Date:  04/21/2020   ID:  Alyssa Lindsey, DOB Jan 18, 1929, MRN [id number]  PCP:  Hoyt Koch, MD  Cardiologist:   Minus Breeding, MD   No chief complaint on file.     History of Present Illness: Alyssa Lindsey is a 86 y.o. female who presents for follow up of atrial fib.   She was in the ED in April with slow ventricular rate/junctional rhythm.   CXR demonstrated no acute disease.  I have ordered a CT.  BNP was mildly elevated.   BNP was mildly elevated.   Echocardiogram demonstrated some septal hypertrophy.  There is likely evidence of some diastolic dysfunction but her systolic function was excellent.  There were no significant valvular abnormalities.  Since I saw her she was felt much better.  She states she feels fine.  She is no longer coughing up any red blood or having any cough at all.  She is not having any fevers or chills.  She denies any substernal chest pressure, neck or arm discomfort.  She still has residual shingles pain but thinks this is slowly resolving.  She thinks her breathing is improved.  She is not describing any PND or orthopnea.  She has had no weight gain and she thinks she has had decreased edema.   Past Medical History:  Diagnosis Date  . Arthritis   . Chronic neck pain   . Diverticulosis of colon (without mention of hemorrhage)   . Family history of malignant neoplasm of gastrointestinal tract   . Gastritis   . Hiatal hernia   . HTN (hypertension)   . LBP (low back pain)   . Memory problem   . Spondylosis   . Type II or unspecified type diabetes mellitus without mention of complication, not stated as uncontrolled     Past Surgical History:  Procedure Laterality Date  . COLONOSCOPY  2010   NORMAL-- DUE 2020  . TUBAL LIGATION       Current Outpatient Medications  Medication Sig Dispense Refill  . amLODipine (NORVASC) 10 MG tablet Take 1 tablet (10 mg total) by mouth daily. 90 tablet 3  . cetirizine  (ZYRTEC) 10 MG tablet TAKE ONE TABLET BY MOUTH ONCE DAILY (Patient taking differently: Take 10 mg by mouth daily. ) 30 tablet 11  . cyclobenzaprine (FLEXERIL) 5 MG tablet Take 1 tablet (5 mg total) by mouth 3 (three) times daily as needed for muscle spasms. 90 tablet 1  . famotidine (PEPCID) 20 MG tablet Take 1 tablet (20 mg total) by mouth 2 (two) times daily. (Patient taking differently: Take 20 mg by mouth as needed for heartburn or indigestion. ) 60 tablet 2  . furosemide (LASIX) 20 MG tablet Take 1 tablet (20 mg total) by mouth as needed for edema. 90 tablet 3  . mirtazapine (REMERON) 15 MG tablet TAKE 1 TABLET BY MOUTH ONCE DAILY AT BEDTIME 90 tablet 0  . pregabalin (LYRICA) 50 MG capsule Take 1 capsule (50 mg total) by mouth 3 (three) times daily. (Patient taking differently: Take 50 mg by mouth 3 (three) times daily as needed (pain). ) 90 capsule 3  . Rivaroxaban (XARELTO) 15 MG TABS tablet Take 1 tablet (15 mg total) by mouth daily with supper. 30 tablet 9  . hydrochlorothiazide (MICROZIDE) 12.5 MG capsule Take 1 capsule (12.5 mg total) by mouth daily. (Patient not taking: Reported on 02/25/2020) 90 capsule 3   No current facility-administered medications for  this visit.    Allergies:   Dicyclomine hcl, Lisinopril, and Aspirin    ROS:  Please see the history of present illness.   Otherwise, review of systems are positive for none.   All other systems are reviewed and negative.    PHYSICAL EXAM: VS:  BP 130/80   Pulse 68   Temp (!) 96.8 F (36 C)   Ht 5\' 5"  (1.651 m)   Wt 169 lb (76.7 kg)   SpO2 95%   BMI 28.12 kg/m  , BMI Body mass index is 28.12 kg/m. GENERAL:  Well appearing NECK:  No jugular venous distention, waveform within normal limits, carotid upstroke brisk and symmetric, no bruits, no thyromegaly LUNGS:  Clear to auscultation bilaterally CHEST:  Unremarkable HEART:  PMI not displaced or sustained,S1 and S2 within normal limits, no S3, no S4, no clicks, no rubs, no  murmurs ABD:  Flat, positive bowel sounds normal in frequency in pitch, no bruits, no rebound, no guarding, no midline pulsatile mass, no hepatomegaly, no splenomegaly EXT:  2 plus pulses throughout, no edema, no cyanosis no clubbing   EKG:  EKG is not ordered today.    Recent Labs: 10/29/2019: TSH 1.350 04/13/2020: BNP 411.1 04/18/2020: ALT 10; BUN 24; Creatinine, Ser 1.83; Hemoglobin 13.5; Platelets 149.0 Platelet estimate appears normal.; Potassium 3.6; Sodium 140    Lipid Panel    Component Value Date/Time   CHOL 144 09/11/2019 0855   TRIG 125.0 09/11/2019 0855   HDL 40.70 09/11/2019 0855   CHOLHDL 4 09/11/2019 0855   VLDL 25.0 09/11/2019 0855   LDLCALC 79 09/11/2019 0855      Wt Readings from Last 3 Encounters:  04/21/20 169 lb (76.7 kg)  04/18/20 167 lb 12.8 oz (76.1 kg)  04/13/20 173 lb (78.5 kg)      Other studies Reviewed: Additional studies/ records that were reviewed today include: Echo, CXR, BNP Review of the above records demonstrates:  Please see elsewhere in the note.     ASSESSMENT AND PLAN:  ATRIAL FIB:Ms.Aliveah B Curtishas a CHA2DS2 - VASc score of 5.  She is tolerating the anticoagulation now.  She is not anemic.  She is no longer having any hemoptysis.  Given this I do not think any change in therapy is indicated.  HTN: Her BP controlled.  No change in therapy.   SOB:  I think she probably did have some edema on her physical when I listen to her previously her lungs sound good now.  She is breathing much better.  Chest x-ray was unremarkable.  Despite the fact that her BNP was slightly elevated, given her renal insufficiency I do not think further diuresis is indicated or further work-up.  I will cancel the noncontrasted CT I was planning as she is much improved.  She will let me know if her symptoms return and we might consider further imaging.  Her family was comfortable with this discussion.   CKD IIIB:   Creatinine was slightly elevated at  1.83.  This is far above her baseline.  No change in therapy.  This can be followed by her primary provider.    HEMOPTYSIS:   As above.  COVID EDUCATION: She has had her vaccine.    Current medicines are reviewed at length with the patient today.  The patient does not have concerns regarding medicines.  The following changes have been made:  no change  Labs/ tests ordered today include:   No orders of the defined types were placed  in this encounter.    Disposition:   FU with me or APP in 6 months.    Signed, Rollene Rotunda, MD  04/21/2020 2:30 PM    [facility]

## 2020-04-21 ENCOUNTER — Encounter: Payer: Self-pay | Admitting: Cardiology

## 2020-04-21 ENCOUNTER — Other Ambulatory Visit: Payer: Self-pay

## 2020-04-21 ENCOUNTER — Ambulatory Visit: Payer: Medicare HMO | Admitting: Cardiology

## 2020-04-21 VITALS — BP 130/80 | HR 68 | Temp 96.8°F | Ht 65.0 in | Wt 169.0 lb

## 2020-04-21 DIAGNOSIS — R0602 Shortness of breath: Secondary | ICD-10-CM

## 2020-04-21 DIAGNOSIS — Z7189 Other specified counseling: Secondary | ICD-10-CM

## 2020-04-21 DIAGNOSIS — N1832 Chronic kidney disease, stage 3b: Secondary | ICD-10-CM

## 2020-04-21 DIAGNOSIS — R042 Hemoptysis: Secondary | ICD-10-CM

## 2020-04-21 DIAGNOSIS — I482 Chronic atrial fibrillation, unspecified: Secondary | ICD-10-CM

## 2020-04-21 DIAGNOSIS — I10 Essential (primary) hypertension: Secondary | ICD-10-CM | POA: Diagnosis not present

## 2020-04-21 NOTE — Patient Instructions (Signed)
Medication Instructions:  NO CHANGES *If you need a refill on your cardiac medications before your next appointment, please call your pharmacy*  Lab Work: NONE ORDERED THIS VISIT  Testing/Procedures: CHEST CT CANCELLED   Follow-Up: At [facility], you and your health needs are our priority.  As part of our continuing mission to provide you with exceptional heart care, we have created designated Provider Care Teams.  These Care Teams include your primary Cardiologist (physician) and Advanced Practice Providers (APPs -  Physician Assistants and Nurse Practitioners) who all work together to provide you with the care you need, when you need it.  Your next appointment:   6 month(s) You will receive a reminder letter in the mail two months in advance. If you don't receive a letter, please call our office to schedule the follow-up appointment.  The format for your next appointment:   In Person  Provider:   You may see one of the  Advanced Practice Providers

## 2020-05-03 ENCOUNTER — Telehealth: Payer: Medicare HMO

## 2020-05-04 DIAGNOSIS — B0229 Other postherpetic nervous system involvement: Secondary | ICD-10-CM | POA: Diagnosis not present

## 2020-05-04 DIAGNOSIS — R0789 Other chest pain: Secondary | ICD-10-CM | POA: Diagnosis not present

## 2020-05-06 NOTE — Addendum Note (Signed)
Addended by: CAIRRIKIER DAVIDSON, Denita Lun M on: 05/06/2020 11:50 AM   Modules accepted: Orders  

## 2020-05-25 ENCOUNTER — Telehealth: Payer: Self-pay | Admitting: Internal Medicine

## 2020-05-25 NOTE — Progress Notes (Signed)
  Chronic Care Management   Outreach Note  05/25/2020 Name: Alyssa Lindsey MRN: [id number] DOB: [birth year]  Referred by: Myrlene Broker, MD Reason for referral : No chief complaint on file.   An unsuccessful telephone outreach was attempted today. The patient was referred to the pharmacist for assistance with care management and care coordination. This note is not being shared with the patient for the following reason: To respect privacy (The patient or proxy has requested that the information not be shared).  Follow Up Plan:   Lynnae January Upstream Scheduler

## 2020-05-27 ENCOUNTER — Telehealth: Payer: Self-pay | Admitting: Internal Medicine

## 2020-05-27 NOTE — Progress Notes (Signed)
°  Chronic Care Management   Outreach Note  05/27/2020 Name: Alyssa Lindsey MRN: [id number] DOB: 04-25-1929  Referred by: Myrlene Broker, MD Reason for referral : No chief complaint on file.   An unsuccessful telephone outreach was attempted today. The patient was referred to the pharmacist for assistance with care management and care coordination.   Follow Up Plan:  Lynnae January Upstream Scheduler

## 2020-05-31 ENCOUNTER — Telehealth: Payer: Self-pay | Admitting: Internal Medicine

## 2020-05-31 NOTE — Progress Notes (Signed)
°  Chronic Care Management   Outreach Note  05/31/2020 Name: IRENE COLLINGS MRN: [id number] DOB: [birth year]  Referred by: Myrlene Broker, MD Reason for referral : No chief complaint on file.   An unsuccessful telephone outreach was attempted today. The patient was referred to the pharmacist for assistance with care management and care coordination. This note is not being shared with the patient for the following reason: To respect privacy (The patient or proxy has requested that the information not be shared).  Follow Up Plan:   Lynnae January Upstream Scheduler

## 2020-06-06 DIAGNOSIS — B0229 Other postherpetic nervous system involvement: Secondary | ICD-10-CM | POA: Diagnosis not present

## 2020-06-06 DIAGNOSIS — G894 Chronic pain syndrome: Secondary | ICD-10-CM | POA: Diagnosis not present

## 2020-06-06 DIAGNOSIS — R0789 Other chest pain: Secondary | ICD-10-CM | POA: Diagnosis not present

## 2020-06-27 ENCOUNTER — Encounter: Payer: Self-pay | Admitting: Internal Medicine

## 2020-06-27 ENCOUNTER — Other Ambulatory Visit: Payer: Self-pay | Admitting: Internal Medicine

## 2020-06-27 ENCOUNTER — Ambulatory Visit (INDEPENDENT_AMBULATORY_CARE_PROVIDER_SITE_OTHER): Payer: Medicare HMO | Admitting: Internal Medicine

## 2020-06-27 ENCOUNTER — Other Ambulatory Visit: Payer: Self-pay

## 2020-06-27 VITALS — BP 136/84 | HR 82 | Temp 98.7°F | Ht 65.0 in | Wt 166.0 lb

## 2020-06-27 DIAGNOSIS — D696 Thrombocytopenia, unspecified: Secondary | ICD-10-CM

## 2020-06-27 DIAGNOSIS — N1832 Chronic kidney disease, stage 3b: Secondary | ICD-10-CM

## 2020-06-27 DIAGNOSIS — B0229 Other postherpetic nervous system involvement: Secondary | ICD-10-CM

## 2020-06-27 MED ORDER — MIRTAZAPINE 30 MG PO TABS: 30.0000 mg | ORAL_TABLET | Freq: Every day | ORAL | 3 refills | Status: DC

## 2020-06-27 MED ORDER — PREGABALIN 50 MG PO CAPS: 100.0000 mg | ORAL_CAPSULE | Freq: Every day | ORAL | 0 refills | Status: DC

## 2020-06-27 NOTE — Progress Notes (Signed)
   Subjective:   Patient ID: Alyssa Lindsey, female    DOB: 12-27-1928, 86 y.o.   MRN: [id number]  HPI The patient is an 86 YO female coming in for concerns about loss of appetite and sleeping during the day. She is having such severe pain from her prior shingles still that she is miserable during the day and cannot sleep well at night time. She is taking 50 mg lyrica in the evening and has been getting hydrocodone from pain doctor. Her daughter is present with her and has been not giving her this last couple of days as it just seems to make her sleep and be out of it rather than helping well with the pain. They have tried muscle relaxers for the pain and tylenol and topical. They are willing to try other things also.   Review of Systems  Constitutional: Positive for activity change, appetite change and fatigue.  HENT: Negative.   Eyes: Negative.   Respiratory: Negative for cough, chest tightness and shortness of breath.   Cardiovascular: Positive for chest pain. Negative for palpitations and leg swelling.  Gastrointestinal: Negative for abdominal distention, abdominal pain, constipation, diarrhea, nausea and vomiting.  Musculoskeletal: Positive for arthralgias, back pain and myalgias.  Skin: Negative.   Neurological: Negative.   Psychiatric/Behavioral: Positive for decreased concentration, dysphoric mood and sleep disturbance.    Objective:  Physical Exam Constitutional:      Appearance: She is well-developed.  HENT:     Head: Normocephalic and atraumatic.  Cardiovascular:     Rate and Rhythm: Normal rate and regular rhythm.  Pulmonary:     Effort: Pulmonary effort is normal. No respiratory distress.     Breath sounds: Normal breath sounds. No wheezing or rales.     Comments: Pain in the left flank and chest wall with thoracic region also Chest:     Chest wall: Tenderness present.  Abdominal:     General: Bowel sounds are normal. There is no distension.     Palpations: Abdomen  is soft.     Tenderness: There is no abdominal tenderness. There is no rebound.  Musculoskeletal:        General: Tenderness present.     Cervical back: Normal range of motion.  Skin:    General: Skin is warm and dry.  Neurological:     Mental Status: She is alert and oriented to person, place, and time.     Coordination: Coordination normal.     Vitals:   06/27/20 1304  BP: 136/84  Pulse: 82  Temp: 98.7 F (37.1 C)  TempSrc: Oral  SpO2: 98%  Weight: 166 lb (75.3 kg)  Height: 5\' 5"  (1.651 m)    This visit occurred during the SARS-CoV-2 public health emergency.  Safety protocols were in place, including screening questions prior to the visit, additional usage of staff PPE, and extensive cleaning of exam room while observing appropriate contact time as indicated for disinfecting solutions.   Assessment & Plan:

## 2020-06-27 NOTE — Patient Instructions (Addendum)
We have sent in lyrica to take up to 2 pills in the evening to help more with pain.  We have sent in the refill of the stronger mirtazepine.   We have given you the TENS unit prescription which is a nerve stimulating machine which you can use on the skin to help with the pain.

## 2020-06-28 LAB — CBC
HCT: 43.7 % (ref 35.0–45.0)
Hemoglobin: 13.7 g/dL (ref 11.7–15.5)
MCH: 27 pg (ref 27.0–33.0)
MCHC: 31.4 g/dL — ABNORMAL LOW (ref 32.0–36.0)
MCV: 86 fL (ref 80.0–100.0)
MPV: 12.9 fL — ABNORMAL HIGH (ref 7.5–12.5)
Platelets: 102 10*3/uL — ABNORMAL LOW (ref 140–400)
RBC: 5.08 10*6/uL (ref 3.80–5.10)
RDW: 15 % (ref 11.0–15.0)
WBC: 6.3 10*3/uL (ref 3.8–10.8)

## 2020-06-28 LAB — RENAL FUNCTION PANEL
Albumin: 4.3 g/dL (ref 3.6–5.1)
BUN/Creatinine Ratio: 17 (calc) (ref 6–22)
BUN: 27 mg/dL — ABNORMAL HIGH (ref 7–25)
CO2: 26 mmol/L (ref 20–32)
Calcium: 9.5 mg/dL (ref 8.6–10.4)
Chloride: 107 mmol/L (ref 98–110)
Creat: 1.59 mg/dL — ABNORMAL HIGH (ref 0.60–0.88)
Glucose, Bld: 106 mg/dL — ABNORMAL HIGH (ref 65–99)
Phosphorus: 3.7 mg/dL (ref 2.1–4.3)
Potassium: 4 mmol/L (ref 3.5–5.3)
Sodium: 144 mmol/L (ref 135–146)

## 2020-06-28 LAB — HEMOGLOBIN A1C
Hgb A1c MFr Bld: 6.7 % of total Hgb — ABNORMAL HIGH (ref <5.7)
Mean Plasma Glucose: 146 (calc)
eAG (mmol/L): 8.1 (calc)

## 2020-06-29 NOTE — Assessment & Plan Note (Signed)
Checking CBC for stability.  

## 2020-06-29 NOTE — Assessment & Plan Note (Addendum)
Severe and limiting her life at this time. Increase lyrica to 2 pills at night time and they will let us know if helping. Also given rx for tens unit. Advised to cut back and/or off hydrocodone as it is not helping and making her confused and sleepy. Also increasing remeron to 30 mg night time to help with sleep at night time and appetite. I do think pain is limiting appetite.

## 2020-06-29 NOTE — Assessment & Plan Note (Signed)
Checking renal function to ensure no changes such as high BUN to cause the sleepiness.

## 2020-07-06 DIAGNOSIS — R0789 Other chest pain: Secondary | ICD-10-CM | POA: Diagnosis not present

## 2020-07-06 DIAGNOSIS — B0229 Other postherpetic nervous system involvement: Secondary | ICD-10-CM | POA: Diagnosis not present

## 2020-07-06 DIAGNOSIS — G894 Chronic pain syndrome: Secondary | ICD-10-CM | POA: Diagnosis not present

## 2020-08-03 ENCOUNTER — Encounter: Payer: Self-pay | Admitting: Internal Medicine

## 2020-08-03 DIAGNOSIS — R0789 Other chest pain: Secondary | ICD-10-CM | POA: Diagnosis not present

## 2020-08-03 DIAGNOSIS — B0229 Other postherpetic nervous system involvement: Secondary | ICD-10-CM | POA: Diagnosis not present

## 2020-08-03 DIAGNOSIS — Z79891 Long term (current) use of opiate analgesic: Secondary | ICD-10-CM | POA: Diagnosis not present

## 2020-08-03 DIAGNOSIS — G8929 Other chronic pain: Secondary | ICD-10-CM

## 2020-08-03 DIAGNOSIS — Z79899 Other long term (current) drug therapy: Secondary | ICD-10-CM | POA: Diagnosis not present

## 2020-08-03 DIAGNOSIS — G894 Chronic pain syndrome: Secondary | ICD-10-CM | POA: Diagnosis not present

## 2020-08-08 ENCOUNTER — Telehealth: Payer: Self-pay | Admitting: Cardiology

## 2020-08-08 NOTE — Telephone Encounter (Signed)
Patient sent a patient schedule message requested an appointment. She states she is having "swelling feet and ankles". She is scheduled 09/19/20. Please advise.

## 2020-08-08 NOTE — Telephone Encounter (Signed)
Spoke with patient and daughter. Daughter states  Patient has swelling  In ankles only . She has not been weighing herself daily .   Daughter states patient has been taking furosemide daily and rarely take the Microzide 12.5 mg  Daily.  RN informed daughter to weigh patient daily for the next 2 days take  Mircozide in the morning and @2 -3 hours furosemide.  Contact offie if no change.  Only use furosemide as needed   Mircozide daily   Daughter verbalized understanding.

## 2020-08-10 ENCOUNTER — Encounter: Payer: Self-pay | Admitting: Physical Medicine & Rehabilitation

## 2020-08-16 DIAGNOSIS — Z79899 Other long term (current) drug therapy: Secondary | ICD-10-CM

## 2020-08-19 DIAGNOSIS — Z79899 Other long term (current) drug therapy: Secondary | ICD-10-CM | POA: Diagnosis not present

## 2020-08-19 LAB — BASIC METABOLIC PANEL
BUN/Creatinine Ratio: 16 (ref 12–28)
BUN: 29 mg/dL — ABNORMAL HIGH (ref 8–27)
CO2: 25 mmol/L (ref 20–29)
Calcium: 9.7 mg/dL (ref 8.7–10.3)
Chloride: 102 mmol/L (ref 96–106)
Creatinine, Ser: 1.78 mg/dL — ABNORMAL HIGH (ref 0.57–1.00)
GFR calc Af Amer: 29 mL/min/{1.73_m2} — ABNORMAL LOW (ref >59)
GFR calc non Af Amer: 25 mL/min/{1.73_m2} — ABNORMAL LOW (ref >59)
Glucose: 164 mg/dL — ABNORMAL HIGH (ref 65–99)
Potassium: 4.1 mmol/L (ref 3.5–5.2)
Sodium: 143 mmol/L (ref 134–144)

## 2020-08-28 ENCOUNTER — Encounter: Payer: Self-pay | Admitting: Internal Medicine

## 2020-09-03 ENCOUNTER — Other Ambulatory Visit: Payer: Self-pay

## 2020-09-03 ENCOUNTER — Emergency Department (HOSPITAL_COMMUNITY)
Admission: EM | Admit: 2020-09-03 | Discharge: 2020-09-03 | Disposition: A | Discharge status: Home / Self Care | Payer: Medicare HMO | Attending: Emergency Medicine | Admitting: Emergency Medicine

## 2020-09-03 ENCOUNTER — Emergency Department (HOSPITAL_COMMUNITY): Payer: Medicare HMO

## 2020-09-03 DIAGNOSIS — Z7901 Long term (current) use of anticoagulants: Secondary | ICD-10-CM | POA: Diagnosis not present

## 2020-09-03 DIAGNOSIS — I129 Hypertensive chronic kidney disease with stage 1 through stage 4 chronic kidney disease, or unspecified chronic kidney disease: Secondary | ICD-10-CM | POA: Diagnosis not present

## 2020-09-03 DIAGNOSIS — E1122 Type 2 diabetes mellitus with diabetic chronic kidney disease: Secondary | ICD-10-CM | POA: Insufficient documentation

## 2020-09-03 DIAGNOSIS — R079 Chest pain, unspecified: Secondary | ICD-10-CM | POA: Diagnosis not present

## 2020-09-03 DIAGNOSIS — Z79899 Other long term (current) drug therapy: Secondary | ICD-10-CM | POA: Insufficient documentation

## 2020-09-03 DIAGNOSIS — R059 Cough, unspecified: Secondary | ICD-10-CM | POA: Diagnosis not present

## 2020-09-03 DIAGNOSIS — Z20822 Contact with and (suspected) exposure to covid-19: Secondary | ICD-10-CM | POA: Diagnosis not present

## 2020-09-03 DIAGNOSIS — R0602 Shortness of breath: Secondary | ICD-10-CM | POA: Diagnosis not present

## 2020-09-03 DIAGNOSIS — N1832 Chronic kidney disease, stage 3b: Secondary | ICD-10-CM | POA: Insufficient documentation

## 2020-09-03 DIAGNOSIS — M25512 Pain in left shoulder: Secondary | ICD-10-CM | POA: Diagnosis not present

## 2020-09-03 DIAGNOSIS — Z7984 Long term (current) use of oral hypoglycemic drugs: Secondary | ICD-10-CM | POA: Diagnosis not present

## 2020-09-03 LAB — CBC
HCT: 43 % (ref 36.0–46.0)
Hemoglobin: 13.3 g/dL (ref 12.0–15.0)
MCH: 27.4 pg (ref 26.0–34.0)
MCHC: 30.9 g/dL (ref 30.0–36.0)
MCV: 88.5 fL (ref 80.0–100.0)
Platelets: 164 10*3/uL (ref 150–400)
RBC: 4.86 MIL/uL (ref 3.87–5.11)
RDW: 16.9 % — ABNORMAL HIGH (ref 11.5–15.5)
WBC: 5.5 10*3/uL (ref 4.0–10.5)
nRBC: 0 % (ref 0.0–0.2)

## 2020-09-03 LAB — BASIC METABOLIC PANEL
Anion gap: 11 (ref 5–15)
BUN: 25 mg/dL — ABNORMAL HIGH (ref 8–23)
CO2: 26 mmol/L (ref 22–32)
Calcium: 9 mg/dL (ref 8.9–10.3)
Chloride: 109 mmol/L (ref 98–111)
Creatinine, Ser: 1.62 mg/dL — ABNORMAL HIGH (ref 0.44–1.00)
GFR, Estimated: 28 mL/min — ABNORMAL LOW (ref >60)
Glucose, Bld: 157 mg/dL — ABNORMAL HIGH (ref 70–99)
Potassium: 4 mmol/L (ref 3.5–5.1)
Sodium: 146 mmol/L — ABNORMAL HIGH (ref 135–145)

## 2020-09-03 LAB — RESPIRATORY PANEL BY RT PCR (FLU A&B, COVID)
Influenza A by PCR: NEGATIVE
Influenza B by PCR: NEGATIVE
SARS Coronavirus 2 by RT PCR: NEGATIVE

## 2020-09-03 LAB — TROPONIN I (HIGH SENSITIVITY)
Troponin I (High Sensitivity): 8 ng/L (ref <18)
Troponin I (High Sensitivity): 7 ng/L (ref <18)

## 2020-09-03 NOTE — ED Provider Notes (Signed)
[facility] EMERGENCY DEPARTMENT Provider Note   CSN: [id number] Arrival date & time: 09/03/20  1121     History Chief Complaint  Patient presents with  . Chest Pain  . Back Pain  . Shortness of Breath    Alyssa Lindsey is a 86 y.o. female.  86 year old female with prior medical history as detailed below presents for evaluation of reported cough.  Patient with cough over the last 2 weeks.  No associated fever, shortness of breath, chest pain, nausea, vomiting, or other specific flank.  Patient does report intermittent left shoulder discomfort associated with the cough.  Patient also has recent shingles infection of the left upper back.  Pain reported to the left upper back appears to be in the distribution of her prior shingles episode.  Upon evaluation today she is comfortable and alert.  She denies current shortness of breath.  She denies current chest pain.  Daughter at bedside reports the patient is taking good p.o.   The history is provided by the patient, a relative and medical records.  Illness Location:  Cough Severity:  Mild Onset quality:  Gradual Duration:  2 weeks Timing:  Intermittent Progression:  Waxing and waning Chronicity:  New      Past Medical History:  Diagnosis Date  . Arthritis   . Chronic neck pain   . Diverticulosis of colon (without mention of hemorrhage)   . Family history of malignant neoplasm of gastrointestinal tract   . Gastritis   . Hiatal hernia   . HTN (hypertension)   . LBP (low back pain)   . Memory problem   . Spondylosis   . Type II or unspecified type diabetes mellitus without mention of complication, not stated as uncontrolled     Patient Active Problem List   Diagnosis Date Noted  . Chronic atrial fibrillation (HCC) 04/20/2020  . Stage 3b chronic kidney disease (HCC) 04/20/2020  . Thrombocytopenia (HCC) 04/18/2020  . Blood in sputum 04/18/2020  . Educated about COVID-19 virus infection 04/12/2020  .  SOB (shortness of breath) 04/12/2020  . Permanent atrial fibrillation (HCC) 04/12/2020  . PHN (postherpetic neuralgia) 12/02/2019  . Need for shingles vaccine 12/02/2019  . Herpes zoster without complication 10/07/2019  . Breast pain, left 10/06/2019  . Constipation 11/18/2017  . Adjustment disorder with mixed anxiety and depressed mood 07/06/2016  . Low back pain 05/09/2016  . Numbness and tingling of both legs 04/25/2016  . Bilateral leg edema 04/11/2016  . Routine general medical examination at a health care facility 12/26/2015  . GERD (gastroesophageal reflux disease) 03/02/2015  . Paroxysmal A-fib (HCC) 03/04/2014  . Right knee pain 03/04/2014  . Chronic LLQ pain 02/07/2012  . B12 deficiency 03/06/2010  . 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 THYROID NODULE 03/03/2008  . BREAST MASS 03/03/2008  . Diabetes type 2, controlled (HCC) 06/21/2007  . Essential hypertension 06/21/2007    Past Surgical History:  Procedure Laterality Date  . COLONOSCOPY  2010   NORMAL-- DUE 2020  . TUBAL LIGATION       OB History   No obstetric history on file.     Family History  Problem Relation Age of Onset  . Emphysema Brother   . Stroke Father 78  . Ovarian cancer Sister   . Breast cancer Sister   . Liver disease Brother   . Colon cancer Daughter   . Diabetes Daughter   . Throat cancer Brother   . Lung cancer Daughter  Social History   Tobacco Use  . Smoking status: Never Smoker  . Smokeless tobacco: Never Used  Substance Use Topics  . Alcohol use: No    Alcohol/week: 0.0 standard drinks  . Drug use: No    Home Medications Prior to Admission medications   Medication Sig Start Date End Date Taking? Authorizing Provider  amLODipine (NORVASC) 10 MG tablet Take 1 tablet (10 mg total) by mouth daily. 12/30/19   Jodelle Gross, NP  cetirizine (ZYRTEC) 10 MG tablet TAKE ONE TABLET BY MOUTH ONCE DAILY Patient taking differently: Take 10 mg by mouth daily.   10/25/16   Myrlene Broker, MD  famotidine (PEPCID) 20 MG tablet Take 1 tablet (20 mg total) by mouth 2 (two) times daily. Patient taking differently: Take 20 mg by mouth as needed for heartburn or indigestion.  09/18/19   Myrlene Broker, MD  furosemide (LASIX) 20 MG tablet Take 1 tablet (20 mg total) by mouth as needed for edema. 02/25/20 05/25/20  Azalee Course, PA  hydrochlorothiazide (MICROZIDE) 12.5 MG capsule Take 1 capsule (12.5 mg total) by mouth daily. Patient not taking: Reported on 02/25/2020 12/30/19 03/29/20  Jodelle Gross, NP  mirtazapine (REMERON) 15 MG tablet TAKE 1 TABLET BY MOUTH ONCE DAILY AT BEDTIME 06/27/20   Myrlene Broker, MD  mirtazapine (REMERON) 30 MG tablet Take 1 tablet (30 mg total) by mouth at bedtime. 06/27/20   Myrlene Broker, MD  pregabalin (LYRICA) 50 MG capsule Take 2 capsules (100 mg total) by mouth at bedtime. 06/27/20   Myrlene Broker, MD  Rivaroxaban (XARELTO) 15 MG TABS tablet Take 1 tablet (15 mg total) by mouth daily with supper. 12/30/19   Jodelle Gross, NP    Allergies    Dicyclomine hcl, Lisinopril, and Aspirin  Review of Systems   Review of Systems  All other systems reviewed and are negative.   Physical Exam Updated Vital Signs BP (!) 162/94   Pulse 84   Temp 98.5 F (36.9 C) (Oral)   Resp 18   Ht 5\' 5"  (1.651 m)   Wt 75.3 kg   SpO2 98%   BMI 27.62 kg/m   Physical Exam Vitals and nursing note reviewed.  Constitutional:      General: She is not in acute distress.    Appearance: She is well-developed.  HENT:     Head: Normocephalic and atraumatic.  Eyes:     Conjunctiva/sclera: Conjunctivae normal.     Pupils: Pupils are equal, round, and reactive to light.  Cardiovascular:     Rate and Rhythm: Normal rate and regular rhythm.     Heart sounds: Normal heart sounds.  Pulmonary:     Effort: Pulmonary effort is normal. No respiratory distress.     Breath sounds: Normal breath sounds.  Abdominal:      General: There is no distension.     Palpations: Abdomen is soft.     Tenderness: There is no abdominal tenderness.  Musculoskeletal:        General: No deformity. Normal range of motion.     Cervical back: Normal range of motion and neck supple.  Skin:    General: Skin is warm and dry.  Neurological:     Mental Status: She is alert and oriented to person, place, and time.     ED Results / Procedures / Treatments   Labs (all labs ordered are listed, but only abnormal results are displayed) Labs Reviewed  BASIC METABOLIC PANEL -  Abnormal; Notable for the following components:      Result Value   Sodium 146 (*)    Glucose, Bld 157 (*)    BUN 25 (*)    Creatinine, Ser 1.62 (*)    GFR, Estimated 28 (*)    All other components within normal limits  CBC - Abnormal; Notable for the following components:   RDW 16.9 (*)    All other components within normal limits  RESPIRATORY PANEL BY RT PCR (FLU A&B, COVID)  TROPONIN I (HIGH SENSITIVITY)  TROPONIN I (HIGH SENSITIVITY)    EKG EKG Interpretation  Date/Time:  Saturday September 03 2020 11:23:54 EDT Ventricular Rate:  102 PR Interval:    QRS Duration: 60 QT Interval:  266 QTC Calculation: 346 R Axis:   57 Text Interpretation: Atrial fibrillation with rapid ventricular response Low voltage QRS ST & T wave abnormality, consider inferior ischemia Abnormal ECG Confirmed by Kristine Royal [phone]) on 09/03/2020 1:19:55 PM   Radiology DG Chest 2 View  Result Date: 09/03/2020 CLINICAL DATA:  Chest pain and shortness of breath. EXAM: CHEST - 2 VIEW COMPARISON:  Chest radiograph Apr 13, 2020. FINDINGS: Stable cardiac and mediastinal contours. Tortuosity of the thoracic aorta. Stable hilar prominence bilaterally. No pleural effusion or pneumothorax. Thoracic spine degenerative changes. IMPRESSION: No acute cardiopulmonary process. Electronically Signed   By: Annia Belt M.D.   On: 09/03/2020 12:45    Procedures Procedures (including  critical care time)  Medications Ordered in ED Medications - No data to display  ED Course  I have reviewed the triage vital signs and the nursing notes.  Pertinent labs & imaging results that were available during my care of the patient were reviewed by me and considered in my medical decision making (see chart for details).    MDM Rules/Calculators/A&P                          MDM  Screen complete  IMAGINE NEST was evaluated in Emergency Department on 09/03/2020 for the symptoms described in the history of present illness. She was evaluated in the context of the global COVID-19 pandemic, which necessitated consideration that the patient might be at risk for infection with the SARS-CoV-2 virus that causes COVID-19. Institutional protocols and algorithms that pertain to the evaluation of patients at risk for COVID-19 are in a state of rapid change based on information released by regulatory bodies including the CDC and federal and state organizations. These policies and algorithms were followed during the patient's care in the ED.  Patient is presenting for evaluation of reported cough.  Patient's presentation is not concerning for possible significant pulmonary infection.  She is also not presenting with symptoms suggestive of ACS or acute coronary condition.  Work-up today is without significant abnormality.  Patient does feel improved she does understand need for close follow-up.  Patient does understand that her Covid test can be followed up through the MyChart system.   Final Clinical Impression(s) / ED Diagnoses Final diagnoses:  Cough    Rx / DC Orders ED Discharge Orders    None       Wynetta Fines, MD 09/03/20 1422

## 2020-09-03 NOTE — ED Triage Notes (Signed)
Pt reports L sided chest pain and pain between shoulder blades x 1 year, worsening recently. Shortness of breath with exertion x 1 week. Family concerned d/t her coughing up brown phlegm.

## 2020-09-03 NOTE — Discharge Instructions (Addendum)
Please return for any problem.  Follow-up with your regular care provider as instructed on Monday.

## 2020-09-06 ENCOUNTER — Other Ambulatory Visit: Payer: Self-pay

## 2020-09-06 ENCOUNTER — Encounter: Payer: Medicare HMO | Attending: Physical Medicine & Rehabilitation | Admitting: Physical Medicine & Rehabilitation

## 2020-09-06 ENCOUNTER — Encounter: Payer: Self-pay | Admitting: Physical Medicine & Rehabilitation

## 2020-09-06 VITALS — BP 152/111 | HR 111 | Temp 98.6°F | Ht 65.0 in | Wt 182.0 lb

## 2020-09-06 DIAGNOSIS — B0229 Other postherpetic nervous system involvement: Secondary | ICD-10-CM | POA: Insufficient documentation

## 2020-09-06 DIAGNOSIS — M791 Myalgia, unspecified site: Secondary | ICD-10-CM | POA: Insufficient documentation

## 2020-09-06 DIAGNOSIS — G479 Sleep disorder, unspecified: Secondary | ICD-10-CM

## 2020-09-06 MED ORDER — PREGABALIN 75 MG PO CAPS: 75.0000 mg | ORAL_CAPSULE | Freq: Two times a day (BID) | ORAL | 0 refills | Status: DC

## 2020-09-06 MED ORDER — LIDOCAINE 5 % EX PTCH: 2.0000 | MEDICATED_PATCH | CUTANEOUS | 0 refills | Status: DC

## 2020-09-06 NOTE — Progress Notes (Signed)
Subjective:    Patient ID: Alyssa Lindsey, female    DOB: 1929-01-02, 86 y.o.   MRN: [id number]  HPI Female with past medical history of DM type II, HTN, OA, dementia, shingles presents with postherpetic neuralgia in left scapula. Daughter supplements history due to dementia. Started 08/2019.  She had shingles at that time.  No alleviating or exacerbating factors.  Sore, sensitive.  Radiates to left breast. Intermittent.  Tylenol provides some relief. She has tried oxycodone without benefit. Denies associated numbness. Denies falls. Pain limits bathing.   Pain Inventory Average Pain 7 Pain Right Now 8 My pain is aching  In the last 24 hours, has pain interfered with the following? General activity 0 Relation with others 0 Enjoyment of life 0 What TIME of day is your pain at its worst? morning , daytime, evening and night Sleep (in general) NA  Pain is worse with: sitting Pain improves with: medication Relief from Meds: 7  walk without assistance Do you have any goals in this area?  yes  Do you have any goals in this area?  no  No problems in this area  new  new    Family History  Problem Relation Age of Onset  . Emphysema Brother   . Stroke Father 53  . Ovarian cancer Sister   . Breast cancer Sister   . Liver disease Brother   . Colon cancer Daughter   . Diabetes Daughter   . Throat cancer Brother   . Lung cancer Daughter    Social History   Socioeconomic History  . Marital status: Widowed    Spouse name: Not on file  . Number of children: 4  . Years of education: Not on file  . Highest education level: Not on file  Occupational History  . Occupation: Retired Advertising copywriter  Tobacco Use  . Smoking status: Never Smoker  . Smokeless tobacco: Never Used  Substance and Sexual Activity  . Alcohol use: No    Alcohol/week: 0.0 standard drinks  . Drug use: No  . Sexual activity: Not on file  Other Topics Concern  . Not on file  Social History Narrative    Widow    4 children: 1 son '59; 3 dtrs '53, '62, '64; 9 grandchildren; great-grand   Lives with dtr at home, grandson at home      Daily Caffeine Use:  1 cup   Regular Exercise -  NO         Social Determinants of Health   Financial Resource Strain:   . Difficulty of Paying Living Expenses: Not on file  Food Insecurity:   . Worried About Programme researcher, broadcasting/film/video in the Last Year: Not on file  . Ran Out of Food in the Last Year: Not on file  Transportation Needs:   . Lack of Transportation (Medical): Not on file  . Lack of Transportation (Non-Medical): Not on file  Physical Activity:   . Days of Exercise per Week: Not on file  . Minutes of Exercise per Session: Not on file  Stress:   . Feeling of Stress : Not on file  Social Connections:   . Frequency of Communication with Friends and Family: Not on file  . Frequency of Social Gatherings with Friends and Family: Not on file  . Attends Religious Services: Not on file  . Active Member of Clubs or Organizations: Not on file  . Attends Banker Meetings: Not on file  . Marital Status: Not on  file   Past Surgical History:  Procedure Laterality Date  . COLONOSCOPY  2010   NORMAL-- DUE 2020  . TUBAL LIGATION     Past Medical History:  Diagnosis Date  . Arthritis   . Chronic neck pain   . Diverticulosis of colon (without mention of hemorrhage)   . Family history of malignant neoplasm of gastrointestinal tract   . Gastritis   . Hiatal hernia   . HTN (hypertension)   . LBP (low back pain)   . Memory problem   . Spondylosis   . Type II or unspecified type diabetes mellitus without mention of complication, not stated as uncontrolled    BP (!) 152/111   Pulse (!) 111   Temp 98.6 F (37 C)   Ht 5\' 5"  (1.651 m)   Wt 182 lb (82.6 kg)   SpO2 99%   BMI 30.29 kg/m   Opioid Risk Score:   Fall Risk Score:  `1  Depression screen PHQ 2/9  Depression screen [facility] 2/9 09/06/2020 09/11/2019 11/21/2018 08/13/2017 07/05/2016  06/23/2015 03/04/2014  Decreased Interest 1 0 0 0 0 0 0  Down, Depressed, Hopeless 0 0 0 0 0 0 0  PHQ - 2 Score 1 0 0 0 0 0 0  Altered sleeping 0 - - - - - -  Tired, decreased energy 0 - - - - - -  Change in appetite 0 - - - - - -  Feeling bad or failure about yourself  0 - - - - - -  Trouble concentrating 0 - - - - - -  Moving slowly or fidgety/restless 0 - - - - - -  Suicidal thoughts 0 - - - - - -  PHQ-9 Score 1 - - - - - -  Difficult doing work/chores Not difficult at all - - - - - -  Some recent data might be hidden    Review of Systems  Constitutional: Negative.   HENT: Negative.   Eyes: Negative.   Respiratory: Negative.   Cardiovascular: Negative.   Gastrointestinal: Negative.   Endocrine: Negative.   Genitourinary: Negative.   Musculoskeletal: Positive for arthralgias and myalgias.  Skin: Negative.   Allergic/Immunologic: Negative.   Neurological: Negative.        Neuropathic pain  Hematological: Negative.   Psychiatric/Behavioral: Positive for sleep disturbance.  All other systems reviewed and are negative.     Objective:   Physical Exam Constitutional: No distress . Vital signs reviewed. HENT: Normocephalic.  Atraumatic. Eyes: EOMI. No discharge. Cardiovascular: No JVD.  05/04/2014 Respiratory: Normal effort.  No stridor.   GI: Non-distended.   Skin: Warm and dry.  Intact. Lesions from healed herpes , most significant along area of tenderness Psych: Normal mood.  Normal behavior. Musc: No edema in extremities.  No tenderness in extremities. Neuro: Alert Motor: 5/5 b/l UE Allofynia left medial scapula    Assessment & Plan:  Female with past medical history of DM type II, HTN, OA, dementia, shingles presents with postherpetic neuralgia in left scapula.   1.Postherpetic neuralgia  Labs reviewed  Referral information reviewed - post herpetic neuralgia eval  PMAWARE reviewed  Trial cold  Will consider PT with trial TENS  Unclear if benefit with Lidoderm will  order  Will change Lyrica to 75 BID  Will consider Cymbalta  Will consider capsacin  Will consider lidocaine ointment  Will consider block  Will try compound medication  Will consider Accupuncture  Patient states main goal is to bathe more  effectively  2. Sleep disturbance  Will consider Elavil  3. Myalgia   Will consider point injections

## 2020-09-18 DIAGNOSIS — M7989 Other specified soft tissue disorders: Secondary | ICD-10-CM | POA: Insufficient documentation

## 2020-09-18 DIAGNOSIS — N1831 Chronic kidney disease, stage 3a: Secondary | ICD-10-CM | POA: Insufficient documentation

## 2020-09-18 NOTE — Progress Notes (Addendum)
Cardiology Office Note   Date:  09/19/2020   ID:  Alyssa Lindsey, DOB [birth year], MRN [id number]  PCP:  Myrlene Broker, MD  Cardiologist:   Rollene Rotunda, MD   Chief Complaint  Patient presents with  . Shortness of Breath      History of Present Illness: Alyssa Lindsey is a 86 y.o. female who presents for follow up of atrial fib.   She was in the ED in April 2020 with slow ventricular rate/junctional rhythm.   She was most recently in the ED earlier this month with cough.  I reviewed these records for this visit.   COVID testing was negative.  Trop was normal.  CXR was unremarkable.  No therapy was administered.  She called the other day with increased leg edema.    Her weight is up about 8 pounds in the last month or more so although she is not weighing himself at home routinely.  Alyssa Lindsey is not really watching the salt in his times a day.[name]  Alyssa Lindsey is getting short of breath walking just to her kitchen from her bedroom which is a short distance.  Alyssa Lindsey is not describing new PND or orthopnea.  She does have cough and again as it was previously there is some blood-tinged sputum.  She described this before but that cleared up  Past Medical History:  Diagnosis Date  . Arthritis   . Chronic neck pain   . Diverticulosis of colon (without mention of hemorrhage)   . Family history of malignant neoplasm of gastrointestinal tract   . Gastritis   . Hiatal hernia   . HTN (hypertension)   . LBP (low back pain)   . Memory problem   . Spondylosis   . Type II or unspecified type diabetes mellitus without mention of complication, not stated as uncontrolled     Past Surgical History:  Procedure Laterality Date  . COLONOSCOPY  2010   NORMAL-- DUE 2020  . TUBAL LIGATION       Current Outpatient Medications  Medication Sig Dispense Refill  . amLODipine (NORVASC) 10 MG tablet Take 1 tablet (10 mg total) by mouth daily. 90 tablet 3  . Rivaroxaban (XARELTO) 15 MG TABS tablet Take 1  tablet (15 mg total) by mouth daily with supper. 30 tablet 9  . cetirizine (ZYRTEC) 10 MG tablet TAKE ONE TABLET BY MOUTH ONCE DAILY (Patient taking differently: Take 10 mg by mouth daily. ) 30 tablet 11  . famotidine (PEPCID) 20 MG tablet Take 1 tablet (20 mg total) by mouth 2 (two) times daily. (Patient taking differently: Take 20 mg by mouth as needed for heartburn or indigestion. ) 60 tablet 2  . furosemide (LASIX) 20 MG tablet Take 1 tablet (20 mg total) by mouth as needed for edema. 90 tablet 3  . hydrochlorothiazide (MICROZIDE) 12.5 MG capsule Take 1 capsule (12.5 mg total) by mouth daily. (Patient not taking: Reported on 02/25/2020) 90 capsule 3  . lidocaine (LIDODERM) 5 % Place 2 patches onto the skin daily. Remove & Discard patch within 12 hours or as directed by MD 60 patch 0  . mirtazapine (REMERON) 30 MG tablet Take 1 tablet (30 mg total) by mouth at bedtime. (Patient not taking: Reported on 09/19/2020) 90 tablet 3   No current facility-administered medications for this visit.    Allergies:   Dicyclomine hcl, Lisinopril, and Aspirin    ROS:  Please see the history of present illness.   Otherwise,  review of systems are positive for none.   All other systems are reviewed and negative.    PHYSICAL EXAM: VS:  BP 124/80   Pulse 97   Ht 5' (1.524 m)   Wt 180 lb 3.2 oz (81.7 kg)   SpO2 94%   BMI 35.19 kg/m  , BMI Body mass index is 35.19 kg/m. GENERAL:  Well appearing NECK:  No jugular venous distention, waveform within normal limits, carotid upstroke brisk and symmetric, no bruits, no thyromegaly LUNGS: Bilateral basilar crackles CHEST:  Unremarkable HEART:  PMI not displaced or sustained,S1 and S2 within normal limits, no S3, no S4, no clicks, no rubs, no murmurs ABD:  Flat, positive bowel sounds normal in frequency in pitch, no bruits, no rebound, no guarding, no midline pulsatile mass, no hepatomegaly, no splenomegaly EXT:  2 plus pulses throughout, no edema, no cyanosis no  clubbing  EKG:  EKG is ordered today. Atrial fibrillation, rate 97, axis within normal limits, low voltage throughout.  No change from previous except the rate is faster   Recent Labs: 10/29/2019: TSH 1.350 04/13/2020: BNP 411.1 04/18/2020: ALT 10 09/03/2020: BUN 25; Creatinine, Ser 1.62; Hemoglobin 13.3; Platelets 164; Potassium 4.0; Sodium 146    Lipid Panel    Component Value Date/Time   CHOL 144 09/11/2019 0855   TRIG 125.0 09/11/2019 0855   HDL 40.70 09/11/2019 0855   CHOLHDL 4 09/11/2019 0855   VLDL 25.0 09/11/2019 0855   LDLCALC 79 09/11/2019 0855      Wt Readings from Last 3 Encounters:  09/19/20 180 lb 3.2 oz (81.7 kg)  09/06/20 182 lb (82.6 kg)  09/03/20 166 lb (75.3 kg)      Other studies Reviewed: Additional studies/ records that were reviewed today include:   ED records Review of the above records demonstrates:  Please see elsewhere in the note.     ASSESSMENT AND PLAN:  ATRIAL FIB:Alyssa Lindsey a CHA2DS2 - VASc score of 5.   She is tolerating anticoagulation.  Her rate is a little bit higher today.  At this point I am not going to change the meds except as below.   HTN: Her BP is controlled.  No change in therapy.  SOB:  I do think she has some acute on chronic diastolic heart failure.  Her ejection fraction preserved earlier this year.  I am going to increase her Lasix to 40 mg daily for the next 4 days.  She has been taking 20 mg daily.  She will have a basic metabolic profile and a BNP level in about a week.  Alyssa Lindsey is to reduce her salt and not increase her fluid intake.  We talked at length about this.  Also because she is again having some blood-tinged sputum I am going to get a noncontrasted CT of her chest.  Chest x-ray in the emergency room was normal earlier this month.   CKD IIIB:   Creatinine was 1.56 which was down from 1.83 previously.  I will watch this closely as above.    COVID EDUCATION: She has had her vaccine.    Current  medicines are reviewed at length with the patient today.  The patient does not have concerns regarding medicines.  The following changes have been made: As above  Labs/ tests ordered today include:   Orders Placed This Encounter  Procedures  . CT Chest Wo Contrast  . B Nat Peptide  . Basic metabolic panel  . EKG 12-Lead     Disposition:  FU with me or APP in 1 month.    Signed, Rollene Rotunda, MD  09/19/2020 9:45 AM    [facility]

## 2020-09-19 ENCOUNTER — Encounter: Payer: Self-pay | Admitting: Cardiology

## 2020-09-19 ENCOUNTER — Other Ambulatory Visit: Payer: Self-pay

## 2020-09-19 ENCOUNTER — Ambulatory Visit: Payer: Medicare HMO | Admitting: Cardiology

## 2020-09-19 VITALS — BP 124/80 | HR 97 | Ht 60.0 in | Wt 180.2 lb

## 2020-09-19 DIAGNOSIS — N1831 Chronic kidney disease, stage 3a: Secondary | ICD-10-CM | POA: Diagnosis not present

## 2020-09-19 DIAGNOSIS — M7989 Other specified soft tissue disorders: Secondary | ICD-10-CM | POA: Diagnosis not present

## 2020-09-19 DIAGNOSIS — R0602 Shortness of breath: Secondary | ICD-10-CM | POA: Diagnosis not present

## 2020-09-19 DIAGNOSIS — I10 Essential (primary) hypertension: Secondary | ICD-10-CM

## 2020-09-19 NOTE — Patient Instructions (Addendum)
Medication Instructions:  INCREASE your lasix to 40 mg once a day for 4 DAYS  ON THE DAY OF YOUR CT SCAN, HOLD YOUR AM DOSE OF LASIX AND HYDROCHLOROTHIAZE UNTIL AFTER YOUR SCAN. *If you need a refill on your cardiac medications before your next appointment, please call your pharmacy*   Lab Work: BNP and BMP in 1 week If you have labs (blood work) drawn today and your tests are completely normal, you will receive your results only by: [name] MyChart Message (if you have MyChart) OR . A paper copy in the mail If you have any lab test that is abnormal or we need to change your treatment, we will call you to review the results.   Testing/Procedures: Your provider has requested that you have a chest CT without contrast. Non-Cardiac CT scanning, (CAT scanning), is a noninvasive, special x-ray that produces cross-sectional images of the body using x-rays and a computer. CT scans help physicians diagnose and treat medical conditions. For some CT exams, a contrast material is used to enhance visibility in the area of the body being studied. CT scans provide greater clarity and reveal more details than regular x-ray exams.    Follow-Up: At [facility], you and your health needs are our priority.  As part of our continuing mission to provide you with exceptional heart care, we have created designated Provider Care Teams.  These Care Teams include your primary Cardiologist (physician) and Advanced Practice Providers (APPs -  Physician Assistants and Nurse Practitioners) who all work together to provide you with the care you need, when you need it.  We recommend signing up for the patient portal called "MyChart".  Sign up information is provided on this After Visit Summary.  MyChart is used to connect with patients for Virtual Visits (Telemedicine).  Patients are able to view lab/test results, encounter notes, upcoming appointments, etc.  Non-urgent messages can be sent to your provider as well.   To learn  more about what you can do with MyChart, go to [email or web address].    Your next appointment:   1 month(s)  The format for your next appointment:   In Person  Provider:   You will see one of the following Advanced Practice Providers on your designated Care Team:    Theodore Demark, PA-C  Joni Reining, DNP, ANP    Other Instructions

## 2020-09-27 ENCOUNTER — Encounter: Payer: Medicare HMO | Attending: Physical Medicine & Rehabilitation | Admitting: Physical Medicine & Rehabilitation

## 2020-09-27 ENCOUNTER — Other Ambulatory Visit: Payer: Self-pay

## 2020-09-27 ENCOUNTER — Telehealth: Payer: Self-pay | Admitting: *Deleted

## 2020-09-27 ENCOUNTER — Encounter: Payer: Self-pay | Admitting: Physical Medicine & Rehabilitation

## 2020-09-27 VITALS — BP 122/69 | HR 92 | Temp 98.4°F | Ht 60.0 in | Wt 177.0 lb

## 2020-09-27 DIAGNOSIS — G479 Sleep disorder, unspecified: Secondary | ICD-10-CM | POA: Diagnosis not present

## 2020-09-27 DIAGNOSIS — M791 Myalgia, unspecified site: Secondary | ICD-10-CM | POA: Insufficient documentation

## 2020-09-27 DIAGNOSIS — R0602 Shortness of breath: Secondary | ICD-10-CM | POA: Diagnosis not present

## 2020-09-27 DIAGNOSIS — B0229 Other postherpetic nervous system involvement: Secondary | ICD-10-CM | POA: Diagnosis not present

## 2020-09-27 DIAGNOSIS — N1831 Chronic kidney disease, stage 3a: Secondary | ICD-10-CM | POA: Diagnosis not present

## 2020-09-27 DIAGNOSIS — I10 Essential (primary) hypertension: Secondary | ICD-10-CM | POA: Diagnosis not present

## 2020-09-27 MED ORDER — NONFORMULARY OR COMPOUNDED ITEM: 1 refills | Status: DC

## 2020-09-27 NOTE — Telephone Encounter (Signed)
[facility] called and they cannot get bupivecaine, can they use lidocaine instead?

## 2020-09-27 NOTE — Progress Notes (Addendum)
Subjective:    Patient ID: Alyssa Lindsey, female    DOB: 11/02/1929, 86 y.o.   MRN: [id number]  HPI Female with past medical history of DM type II, HTN, OA, dementia, shingles presents with postherpetic neuralgia in left scapula.   Initially stated: ?Niece supplements history due to dementia. Started 08/2019.  She had shingles at that time.  No alleviating or exacerbating factors.  Sore, sensitive.  Radiates to left breast. Intermittent.  Tylenol provides some relief. She has tried oxycodone without benefit. Denies associated numbness. Denies falls. Pain limits bathing.   Last clinic visit on 09/06/2020.  Daughter supplements history, limited historian.  Patient limited historian regarding history of changes/improvement. Since that time, she states ice helps "a little bit". She does not think Lidoderm patch helped.  Lyrica provided some benefit. She notes improvement with bathing.   Pain Inventory Average Pain 6 Pain Right Now 6 My pain is burning and aching  In the last 24 hours, has pain interfered with the following? General activity 6 Relation with others 6 Enjoyment of life 5 What TIME of day is your pain at its worst? morning , daytime, evening, night and varies Sleep (in general) NA  Pain is worse with: sitting and unsure Pain improves with: rest and medication Relief from Meds: 7     Family History  Problem Relation Age of Onset  . Emphysema Brother   . Stroke Father 40  . Ovarian cancer Sister   . Breast cancer Sister   . Liver disease Brother   . Colon cancer Daughter   . Diabetes Daughter   . Throat cancer Brother   . Lung cancer Daughter    Social History   Socioeconomic History  . Marital status: Widowed    Spouse name: Not on file  . Number of children: 4  . Years of education: Not on file  . Highest education level: Not on file  Occupational History  . Occupation: Retired Advertising copywriter  Tobacco Use  . Smoking status: Never Smoker  . Smokeless  tobacco: Never Used  Substance and Sexual Activity  . Alcohol use: No    Alcohol/week: 0.0 standard drinks  . Drug use: No  . Sexual activity: Not on file  Other Topics Concern  . Not on file  Social History Narrative   Widow    4 children: 1 son '59; 3 dtrs '53, '62, '64; 9 grandchildren; great-grand   Lives with dtr at home, grandson at home      Daily Caffeine Use:  1 cup   Regular Exercise -  NO         Social Determinants of Health   Financial Resource Strain:   . Difficulty of Paying Living Expenses: Not on file  Food Insecurity:   . Worried About Programme researcher, broadcasting/film/video in the Last Year: Not on file  . Ran Out of Food in the Last Year: Not on file  Transportation Needs:   . Lack of Transportation (Medical): Not on file  . Lack of Transportation (Non-Medical): Not on file  Physical Activity:   . Days of Exercise per Week: Not on file  . Minutes of Exercise per Session: Not on file  Stress:   . Feeling of Stress : Not on file  Social Connections:   . Frequency of Communication with Friends and Family: Not on file  . Frequency of Social Gatherings with Friends and Family: Not on file  . Attends Religious Services: Not on file  .  Active Member of Clubs or Organizations: Not on file  . Attends Banker Meetings: Not on file  . Marital Status: Not on file   Past Surgical History:  Procedure Laterality Date  . COLONOSCOPY  2010   NORMAL-- DUE 2020  . TUBAL LIGATION     Past Medical History:  Diagnosis Date  . Arthritis   . Chronic neck pain   . Diverticulosis of colon (without mention of hemorrhage)   . Family history of malignant neoplasm of gastrointestinal tract   . Gastritis   . Hiatal hernia   . HTN (hypertension)   . LBP (low back pain)   . Memory problem   . Spondylosis   . Type II or unspecified type diabetes mellitus without mention of complication, not stated as uncontrolled    BP 122/69   Pulse 92   Temp 98.4 F (36.9 C)   Ht 5'  (1.524 m)   Wt 177 lb (80.3 kg)   SpO2 94%   BMI 34.57 kg/m   Opioid Risk Score:   Fall Risk Score:  `1  Depression screen PHQ 2/9  Depression screen [facility] 2/9 09/27/2020 09/06/2020 09/11/2019 11/21/2018 08/13/2017 07/05/2016 06/23/2015  Decreased Interest 0 1 0 0 0 0 0  Down, Depressed, Hopeless 0 0 0 0 0 0 0  PHQ - 2 Score 0 1 0 0 0 0 0  Altered sleeping - 0 - - - - -  Tired, decreased energy - 0 - - - - -  Change in appetite - 0 - - - - -  Feeling bad or failure about yourself  - 0 - - - - -  Trouble concentrating - 0 - - - - -  Moving slowly or fidgety/restless - 0 - - - - -  Suicidal thoughts - 0 - - - - -  PHQ-9 Score - 1 - - - - -  Difficult doing work/chores - Not difficult at all - - - - -  Some recent data might be hidden    Review of Systems  Constitutional: Negative.   HENT: Negative.   Eyes: Negative.   Respiratory: Positive for shortness of breath.   Cardiovascular: Negative.   Gastrointestinal: Negative.   Endocrine: Negative.   Genitourinary: Negative.   Musculoskeletal: Positive for arthralgias and myalgias.  Skin: Negative.   Allergic/Immunologic: Negative.   Neurological: Negative.        Neuropathic pain  Hematological: Negative.   Psychiatric/Behavioral: Positive for sleep disturbance.  All other systems reviewed and are negative.     Objective:   Physical Exam  Constitutional: No distress . Vital signs reviewed. HENT: Normocephalic.  Atraumatic. Eyes: EOMI. No discharge. Cardiovascular: No JVD.   Respiratory: Normal effort.  No stridor.   GI: Non-distended.   Skin: Warm and dry.  Intact. Psych: Normal mood.  Normal behavior. Musc: No edema in extremities.  No tenderness in extremities. Skin; Lesions from healed herpes , most significant along area of tenderness Neuro: Alert Motor: 5/5 b/l UE Allofynia left medial scapula, some improvement, able to don purse on that side    Assessment & Plan:  Female with past medical history of DM type  II, HTN, OA, dementia, shingles presents with postherpetic neuralgia in left scapula.   1.Postherpetic neuralgia  Continue cold  Will consider PT with trial TENS  ?Limited benefit with Lidoderm patch  Continue Lyrica to 75 BID  Will consider Cymbalta  Will consider capsacin  Will consider lidocaine ointment  Will consider  block  Will order compound medication  Will consider Accupuncture  Patient states main goal is to bathe more effectively  Encouraged diary for pain in conjunction with meds  2. Sleep disturbance  Will consider Elavil on next visit   See #1  3. Myalgia   Will consider point injections

## 2020-09-28 LAB — BASIC METABOLIC PANEL
BUN/Creatinine Ratio: 17 (ref 12–28)
BUN: 31 mg/dL — ABNORMAL HIGH (ref 8–27)
CO2: 23 mmol/L (ref 20–29)
Calcium: 10.2 mg/dL (ref 8.7–10.3)
Chloride: 103 mmol/L (ref 96–106)
Creatinine, Ser: 1.78 mg/dL — ABNORMAL HIGH (ref 0.57–1.00)
GFR calc Af Amer: 29 mL/min/{1.73_m2} — ABNORMAL LOW (ref >59)
GFR calc non Af Amer: 25 mL/min/{1.73_m2} — ABNORMAL LOW (ref >59)
Glucose: 180 mg/dL — ABNORMAL HIGH (ref 65–99)
Potassium: 4.3 mmol/L (ref 3.5–5.2)
Sodium: 144 mmol/L (ref 134–144)

## 2020-09-28 LAB — BRAIN NATRIURETIC PEPTIDE: BNP: 180.6 pg/mL — ABNORMAL HIGH (ref 0.0–100.0)

## 2020-09-28 NOTE — Telephone Encounter (Signed)
I notified [facility].

## 2020-09-28 NOTE — Telephone Encounter (Signed)
That's fine. Thanks!

## 2020-09-29 ENCOUNTER — Other Ambulatory Visit: Payer: Self-pay | Admitting: Physical Medicine & Rehabilitation

## 2020-10-05 ENCOUNTER — Other Ambulatory Visit: Payer: Self-pay | Admitting: Physical Medicine & Rehabilitation

## 2020-10-07 ENCOUNTER — Ambulatory Visit (HOSPITAL_COMMUNITY)
Admission: RE | Admit: 2020-10-07 | Discharge: 2020-10-07 | Disposition: A | Discharge status: Home / Self Care | Payer: Medicare HMO | Source: Ambulatory Visit | Attending: Cardiology | Admitting: Cardiology

## 2020-10-07 ENCOUNTER — Other Ambulatory Visit: Payer: Self-pay

## 2020-10-07 DIAGNOSIS — R0602 Shortness of breath: Secondary | ICD-10-CM | POA: Diagnosis not present

## 2020-10-13 NOTE — Progress Notes (Signed)
Cardiology Office Note   Date:  10/14/2020   ID:  Alyssa Lindsey, DOB 07/08/1929, MRN [id number]  PCP:  Myrlene Broker, MD  Cardiologist:   Rollene Rotunda, MD   Chief Complaint  Patient presents with  . Shortness of Breath      History of Present Illness: Alyssa Lindsey is a 86 y.o. female who presents for follow up of atrial fib.   She was in the ED in April 2020 with slow ventricular rate/junctional rhythm.   She was most recently in the ED in October with cough.  I saw her after that for evaluation of leg swelling.  She had some SOB and hemoptysis but no clear source on CT that I ordered. I treated her with increased Lasix.  She did not really have any improvement in her shortness of breath.  She still short of breath walking 10 feet in her house.  She is not describing PND or orthopnea.  She is not having any chest pressure, neck or arm discomfort.  Of note she does have some increased wheezing and cough recently and wonders if she might not have some sinus congestion or bronchitis.  She has not had any fevers or chills.  She is not having any presyncope or syncope.  She said no new weight gain or edema.    Past Medical History:  Diagnosis Date  . Arthritis   . Chronic neck pain   . Diverticulosis of colon (without mention of hemorrhage)   . Family history of malignant neoplasm of gastrointestinal tract   . Gastritis   . Hiatal hernia   . HTN (hypertension)   . LBP (low back pain)   . Memory problem   . Spondylosis   . Type II or unspecified type diabetes mellitus without mention of complication, not stated as uncontrolled     Past Surgical History:  Procedure Laterality Date  . COLONOSCOPY  2010   NORMAL-- DUE 2020  . TUBAL LIGATION       Current Outpatient Medications  Medication Sig Dispense Refill  . cetirizine (ZYRTEC) 10 MG tablet TAKE ONE TABLET BY MOUTH ONCE DAILY 30 tablet 11  . famotidine (PEPCID) 20 MG tablet Take 1 tablet (20 mg total)  by mouth 2 (two) times daily. 60 tablet 2  . furosemide (LASIX) 20 MG tablet Take 1 tablet (20 mg total) by mouth as needed for edema. 90 tablet 3  . hydrochlorothiazide (MICROZIDE) 12.5 MG capsule Take 1 capsule (12.5 mg total) by mouth daily. 90 capsule 3  . HYDROcodone-acetaminophen (NORCO) 10-325 MG tablet Take 1 tablet by mouth. One tablet po 5 times per day as needed    . lidocaine (LIDODERM) 5 % Place 2 patches onto the skin daily. Remove & Discard patch within 12 hours or as directed by MD 60 patch 0  . losartan (COZAAR) 50 MG tablet Take 50 mg by mouth daily.    . mirtazapine (REMERON) 30 MG tablet Take 1 tablet (30 mg total) by mouth at bedtime. 90 tablet 3  . NONFORMULARY OR COMPOUNDED ITEM Ketamine 10%, Baclofen 2%, Cyclobenzaprine 2%, Ketoprofen 10%, Gabapentin 6%, Bupivacaine 1%, Amitryptiline 5% 1 each 1  . pregabalin (LYRICA) 75 MG capsule Take 1 capsule (75 mg total) by mouth 2 (two) times daily. 90 capsule 0  . Rivaroxaban (XARELTO) 15 MG TABS tablet Take 1 tablet (15 mg total) by mouth daily with supper. 30 tablet 9  . azithromycin (ZITHROMAX) 250 MG tablet Take  1 tablet daily for 6 days 6 each 0  . diltiazem (CARDIZEM CD) 240 MG 24 hr capsule Take 1 capsule (240 mg total) by mouth daily. 90 capsule 3   No current facility-administered medications for this visit.    Allergies:   Dicyclomine hcl, Lisinopril, and Aspirin    ROS:  Please see the history of present illness.   Otherwise, review of systems are positive for none.   All other systems are reviewed and negative.    PHYSICAL EXAM: VS:  BP 134/88   Pulse (!) 103   Ht 5\' 5"  (1.651 m)   Wt 181 lb (82.1 kg)   BMI 30.12 kg/m  , BMI Body mass index is 30.12 kg/m. GENERAL:  Well appearing NECK:  No jugular venous distention, waveform within normal limits, carotid upstroke brisk and symmetric, no bruits, no thyromegaly LUNGS:  Clear to auscultation bilaterally CHEST: Decreased breath sounds with some diffuse  crackles HEART:  PMI not displaced or sustained,S1 and S2 within normal limits, no S3, no clicks, no rubs, no murmurs, irregular ABD:  Flat, positive bowel sounds normal in frequency in pitch, no bruits, no rebound, no guarding, no midline pulsatile mass, no hepatomegaly, no splenomegaly EXT:  2 plus pulses throughout, moderate edema, no cyanosis no clubbing   EKG:  EKG is  ordered today. Atrial fibrillation, rate 103, axis within normal limits, low voltage throughout.  No change from previous except the rate is faster   Recent Labs: 10/29/2019: TSH 1.350 04/18/2020: ALT 10 09/03/2020: Hemoglobin 13.3; Platelets 164 09/27/2020: BNP 180.6; BUN 31; Creatinine, Ser 1.78; Potassium 4.3; Sodium 144    Lipid Panel    Component Value Date/Time   CHOL 144 09/11/2019 0855   TRIG 125.0 09/11/2019 0855   HDL 40.70 09/11/2019 0855   CHOLHDL 4 09/11/2019 0855   VLDL 25.0 09/11/2019 0855   LDLCALC 79 09/11/2019 0855      Wt Readings from Last 3 Encounters:  10/14/20 181 lb (82.1 kg)  09/27/20 177 lb (80.3 kg)  09/19/20 180 lb 3.2 oz (81.7 kg)      Other studies Reviewed: Additional studies/ records that were reviewed today include:   CT Review of the above records demonstrates:  Please see elsewhere in the note.     ASSESSMENT AND PLAN:  ATRIAL FIB:Ms.[name] a CHA2DS2 - VASc score of 5.   I will know if her rate could be contributing to her dyspnea but I am going to stop the amlodipine and start Cardizem CD 240 mg daily.   Otherwise no change in therapy.   HTN: Her BP is going to be controlled in the context of managing her heart rate.   SOB:  It did not respond to increased diuretic.  I do not see a cause on her CT.  She is no longer having any hemoptysis.  Today I thought that she might have some evidence of an upper respiratory infection and I went ahead and gave her a Z-Pak.  I told her to present to her primary provider if it is not any better.  We will also see if  it improves with rate control.   CKD IIIB:   Creatinine was 1.78 earlier this month.   This is relatively stable.  I will defer to 09/21/20, MD   COVID EDUCATION: She has had her vaccine.    Current medicines are reviewed at length with the patient today.  The patient does not have concerns regarding medicines.  The following changes have been made: As above  Labs/ tests ordered today include:   Orders Placed This Encounter  Procedures  . EKG 12-Lead     Disposition:   FU with me or APP in 1 month.    Signed, Rollene Rotunda, MD  10/14/2020 12:48 PM     [facility]

## 2020-10-14 ENCOUNTER — Encounter: Payer: Self-pay | Admitting: Cardiology

## 2020-10-14 ENCOUNTER — Ambulatory Visit: Payer: Medicare HMO | Admitting: Cardiology

## 2020-10-14 VITALS — BP 134/88 | HR 103 | Ht 65.0 in | Wt 181.0 lb

## 2020-10-14 DIAGNOSIS — I10 Essential (primary) hypertension: Secondary | ICD-10-CM

## 2020-10-14 DIAGNOSIS — M7989 Other specified soft tissue disorders: Secondary | ICD-10-CM

## 2020-10-14 DIAGNOSIS — R0602 Shortness of breath: Secondary | ICD-10-CM | POA: Diagnosis not present

## 2020-10-14 DIAGNOSIS — N1832 Chronic kidney disease, stage 3b: Secondary | ICD-10-CM

## 2020-10-14 DIAGNOSIS — I480 Paroxysmal atrial fibrillation: Secondary | ICD-10-CM | POA: Diagnosis not present

## 2020-10-14 MED ORDER — DILTIAZEM HCL ER COATED BEADS 240 MG PO CP24: 240.0000 mg | ORAL_CAPSULE | Freq: Every day | ORAL | 3 refills | Status: DC

## 2020-10-14 MED ORDER — AZITHROMYCIN 250 MG PO TABS: ORAL_TABLET | ORAL | 0 refills | Status: DC

## 2020-10-14 NOTE — Patient Instructions (Signed)
Medication Instructions:  Z-Pak, take 1 tablet daily for 6 days Stop Amlodipine Start Cardizem 240mg  daily *If you need a refill on your cardiac medications before your next appointment, please call your pharmacy*  Lab Work: None ordered this visit  Testing/Procedures: None ordered this visit  Follow-Up: At [facility], you and your health needs are our priority.  As part of our continuing mission to provide you with exceptional heart care, we have created designated Provider Care Teams.  These Care Teams include your primary Cardiologist (physician) and Advanced Practice Providers (APPs -  Physician Assistants and Nurse Practitioners) who all work together to provide you with the care you need, when you need it.  Your next appointment:   1 month(s)  The format for your next appointment:   In Person  Provider:   Any available APP

## 2020-10-24 ENCOUNTER — Encounter: Payer: Self-pay | Admitting: Internal Medicine

## 2020-10-25 ENCOUNTER — Other Ambulatory Visit: Payer: Self-pay

## 2020-10-25 ENCOUNTER — Encounter: Payer: Self-pay | Admitting: Physical Medicine & Rehabilitation

## 2020-10-25 ENCOUNTER — Encounter: Payer: Medicare HMO | Admitting: Physical Medicine & Rehabilitation

## 2020-10-25 VITALS — BP 146/70 | HR 95 | Temp 98.7°F | Ht 65.0 in | Wt 184.0 lb

## 2020-10-25 DIAGNOSIS — B0229 Other postherpetic nervous system involvement: Secondary | ICD-10-CM | POA: Diagnosis not present

## 2020-10-25 DIAGNOSIS — M791 Myalgia, unspecified site: Secondary | ICD-10-CM | POA: Insufficient documentation

## 2020-10-25 DIAGNOSIS — G479 Sleep disorder, unspecified: Secondary | ICD-10-CM | POA: Diagnosis not present

## 2020-10-25 MED ORDER — NONFORMULARY OR COMPOUNDED ITEM
1 refills | Status: DC
Start: 2020-10-25 — End: 2021-11-29

## 2020-10-25 MED ORDER — CAPSAICIN 0.1 % EX CREA: TOPICAL_CREAM | CUTANEOUS | 1 refills | Status: DC

## 2020-10-25 MED ORDER — AMITRIPTYLINE HCL 10 MG PO TABS: 10.0000 mg | ORAL_TABLET | Freq: Every day | ORAL | 1 refills | Status: DC

## 2020-10-25 MED ORDER — VALACYCLOVIR HCL 1 G PO TABS: 1000.0000 mg | ORAL_TABLET | Freq: Three times a day (TID) | ORAL | 0 refills | Status: AC

## 2020-10-25 NOTE — Progress Notes (Signed)
Subjective:    Patient ID: Alyssa Lindsey, female    DOB: [birth year], 86 y.o.   MRN: [id number]  HPI Female with past medical history of DM type II, HTN, OA, dementia, shingles presents with postherpetic neuralgia in left scapula.   Initially stated: ?Niece supplements history due to dementia. Started 08/2019.  She had shingles at that time.  No alleviating or exacerbating factors.  Sore, sensitive.  Radiates to left breast. Intermittent.  Tylenol provides some relief. She has tried oxycodone without benefit. Denies associated numbness. Denies falls. Pain limits bathing.   Last clinic visit on 09/27/20.  Limited historian. Family supplement history. Since that time, pt states Lyrica is helping.  She notes improvement with compound medication as well. Her bathing is improving. Sleep is fair.   Pain Inventory Average Pain 10 Pain Right Now 10 My pain is sharp, burning and stabbing  In the last 24 hours, has pain interfered with the following? General activity 7 Relation with others 7 Enjoyment of life 9 What TIME of day is your pain at its worst? morning , daytime, evening and night Sleep (in general) Fair  Pain is worse with: walking, bending, sitting and standing Pain improves with: rest and medication Relief from Meds: 0     Family History  Problem Relation Age of Onset  . Emphysema Brother   . Stroke Father 17  . Ovarian cancer Sister   . Breast cancer Sister   . Liver disease Brother   . Colon cancer Daughter   . Diabetes Daughter   . Throat cancer Brother   . Lung cancer Daughter    Social History   Socioeconomic History  . Marital status: Widowed    Spouse name: Not on file  . Number of children: 4  . Years of education: Not on file  . Highest education level: Not on file  Occupational History  . Occupation: Retired Advertising copywriter  Tobacco Use  . Smoking status: Never Smoker  . Smokeless tobacco: Never Used  Substance and Sexual Activity  . Alcohol use: No     Alcohol/week: 0.0 standard drinks  . Drug use: No  . Sexual activity: Not on file  Other Topics Concern  . Not on file  Social History Narrative   Widow    4 children: 1 son '59; 3 dtrs '53, '62, '64; 9 grandchildren; great-grand   Lives with dtr at home, grandson at home      Daily Caffeine Use:  1 cup   Regular Exercise -  NO         Social Determinants of Health   Financial Resource Strain:   . Difficulty of Paying Living Expenses: Not on file  Food Insecurity:   . Worried About Programme researcher, broadcasting/film/video in the Last Year: Not on file  . Ran Out of Food in the Last Year: Not on file  Transportation Needs:   . Lack of Transportation (Medical): Not on file  . Lack of Transportation (Non-Medical): Not on file  Physical Activity:   . Days of Exercise per Week: Not on file  . Minutes of Exercise per Session: Not on file  Stress:   . Feeling of Stress : Not on file  Social Connections:   . Frequency of Communication with Friends and Family: Not on file  . Frequency of Social Gatherings with Friends and Family: Not on file  . Attends Religious Services: Not on file  . Active Member of Clubs or Organizations: Not on file  .  Attends Banker Meetings: Not on file  . Marital Status: Not on file   Past Surgical History:  Procedure Laterality Date  . COLONOSCOPY  2010   NORMAL-- DUE 2020  . TUBAL LIGATION     Past Medical History:  Diagnosis Date  . Arthritis   . Chronic neck pain   . Diverticulosis of colon (without mention of hemorrhage)   . Family history of malignant neoplasm of gastrointestinal tract   . Gastritis   . Hiatal hernia   . HTN (hypertension)   . LBP (low back pain)   . Memory problem   . Spondylosis   . Type II or unspecified type diabetes mellitus without mention of complication, not stated as uncontrolled    BP (!) 146/70   Pulse 95   Temp 98.7 F (37.1 C)   Wt 184 lb (83.5 kg)   SpO2 96%   BMI 30.62 kg/m   Opioid Risk Score:    Fall Risk Score:  `1  Depression screen PHQ 2/9  Depression screen [facility] 2/9 09/27/2020 09/06/2020 09/11/2019 11/21/2018 08/13/2017 07/05/2016 06/23/2015  Decreased Interest 0 1 0 0 0 0 0  Down, Depressed, Hopeless 0 0 0 0 0 0 0  PHQ - 2 Score 0 1 0 0 0 0 0  Altered sleeping - 0 - - - - -  Tired, decreased energy - 0 - - - - -  Change in appetite - 0 - - - - -  Feeling bad or failure about yourself  - 0 - - - - -  Trouble concentrating - 0 - - - - -  Moving slowly or fidgety/restless - 0 - - - - -  Suicidal thoughts - 0 - - - - -  PHQ-9 Score - 1 - - - - -  Difficult doing work/chores - Not difficult at all - - - - -  Some recent data might be hidden    Review of Systems  Constitutional: Negative.   HENT: Negative.   Eyes: Negative.   Respiratory: Negative.   Cardiovascular: Negative.   Gastrointestinal: Negative.   Endocrine: Negative.   Genitourinary: Negative.   Musculoskeletal: Positive for arthralgias and myalgias.  Skin: Negative.   Allergic/Immunologic: Negative.   Neurological: Negative.        Neuropathic pain  Hematological: Negative.   Psychiatric/Behavioral: Positive for sleep disturbance.  All other systems reviewed and are negative.     Objective:   Physical Exam  Constitutional: No distress . Vital signs reviewed. HENT: Normocephalic.  Atraumatic. Eyes: EOMI. No discharge. Cardiovascular: No JVD.   Respiratory: Normal effort.  No stridor.   GI: Non-distended.   Skin: Warm and dry.  Herpetic lesions in T4 distrubution. Psych: Normal mood.  Normal behavior. Musc: Back TTP.  Atrophy of skin along area Neuro: Alert Motor: 5/5 b/l UE Allodynia left medial scapula, some improvement, able to don purse on that side    Assessment & Plan:  Female with past medical history of DM type II, HTN, OA, dementia, shingles presents with postherpetic neuralgia in left scapula.   1.Postherpetic neuralgia  Continue cold  Patient has TENS, encouraged use  ?Limited  benefit with Lidoderm patch  Continue Lyrica to 75 BID  Will consider Cymbalta  Will order capsacin cream  Will consider lidocaine ointment  Will consider block  Continue compound medication  Will consider Accupuncture  Patient states main goal is to bathe more effectively  Encouraged diary for pain in conjunction with meds  2. Sleep disturbance  Will order Elavil 10mg  qhs, educated on signs/symptoms of serotonin syndrome  See #1  3. Myalgia   Will consider point injections

## 2020-11-07 ENCOUNTER — Telehealth: Payer: Self-pay | Admitting: Cardiology

## 2020-11-07 NOTE — Telephone Encounter (Signed)
Pt c/o medication issue:  1. Name of Medication: diltiazem (CARDIZEM CD) 240 MG 24 hr capsule  2. How are you currently taking this medication (dosage and times per day)? 1 capsule by mouth daily   3. Are you having a reaction (difficulty breathing--STAT)? Yes   4. What is your medication issue? Alyssa Lindsey is calling stating Alyssa Lindsey started having excessive SOB and fatigue on Saturday that she believes is due to taking this medication. Please advise.    Pt c/o Shortness Of Breath: STAT if SOB developed within the last 24 hours or pt is noticeably SOB on the phone  1. Are you currently SOB (can you hear that pt is SOB on the phone)? Yes   2. How long have you been experiencing SOB? Started Saturday 11/05/20  3. Are you SOB when sitting or when up moving around? Moving around   4. Are you currently experiencing any other symptoms? Excessive Fatigue

## 2020-11-07 NOTE — Telephone Encounter (Signed)
Spoke with daughter and scheduled visit with Dr Antoine Poche for tomorrow

## 2020-11-07 NOTE — Telephone Encounter (Signed)
She should hold the Cardizem.  She can come in or an EKG.

## 2020-11-07 NOTE — Addendum Note (Signed)
Addended by: Regis Bill B on: 11/07/2020 05:49 PM   Modules accepted: Orders

## 2020-11-07 NOTE — Telephone Encounter (Signed)
Spoke with patients daughter and she the Diltiazem after last visit. Has not had Diltiazem this morning,  Fatigue/lightheaded/weakness since starting Diltiazem  She has had increasing shortness of breath over the weekend, worse with exertion. Daughter thinks there is some wheezing.  Does not have way to check blood pressure Pulse ox reading O2  95%, HR 38. Daughter did manual check of radial pulse, 33 Denies cough, that cleared up after took Zpak  Will forward to Dr Antoine Poche for review

## 2020-11-07 NOTE — Progress Notes (Signed)
Cardiology Office Note   Date:  11/08/2020   ID:  [name], DOB 1929/03/15, MRN [id number]  PCP:  Myrlene Broker, MD  Cardiologist:   Rollene Rotunda, MD   Chief Complaint  Patient presents with   Shortness of Breath      History of Present Illness: Alyssa Lindsey is a 86 y.o. female who presents for follow up of atrial fib.   She was in the ED in April 2020 with slow ventricular rate/junctional rhythm.   At the last visit I stopped the amlodipine and I started Cardizem for better rate control.  She was complaining of SOB.   I have done a CT this year to evaluate this and some chest discomfort as well as hemoptysis.  This demonstrated no acute process.  Her hemoptysis is stopped.  She is not having cough.  We did an echocardiogram earlier this year to look for etiology of her shortness of breath and some discomfort that was atypical.  There were no acute abnormalities there.  There is no evidence of pericardial effusion in particular.  She did have a mildly elevated BNP previously and has been treated with diuretics.  She is not describing PND or orthopnea.  She is not having any palpitations, presyncope or syncope.  She does have this chest discomfort.  It is in the upper left breast.  There is some slight discomfort with deep breathing or movement but she is not really finding an uncomfortable position.  She is not describing substernal chest discomfort.  She has had lower heart rates in the 40s and she has not been taking the Cardizem now since Saturday.     Past Medical History:  Diagnosis Date   Arthritis    Chronic neck pain    Diverticulosis of colon (without mention of hemorrhage)    Family history of malignant neoplasm of gastrointestinal tract    Gastritis    Hiatal hernia    HTN (hypertension)    LBP (low back pain)    Memory problem    Spondylosis    Type II or unspecified type diabetes mellitus without mention of complication, not  stated as uncontrolled     Past Surgical History:  Procedure Laterality Date   COLONOSCOPY  2010   NORMAL-- DUE 2020   TUBAL LIGATION       Current Outpatient Medications  Medication Sig Dispense Refill   amitriptyline (ELAVIL) 10 MG tablet Take 1 tablet (10 mg total) by mouth at bedtime. 30 tablet 1   cetirizine (ZYRTEC) 10 MG tablet TAKE ONE TABLET BY MOUTH ONCE DAILY 30 tablet 11   famotidine (PEPCID) 20 MG tablet Take 1 tablet (20 mg total) by mouth 2 (two) times daily. 60 tablet 2   furosemide (LASIX) 20 MG tablet Take 1 tablet (20 mg total) by mouth as needed for edema. 90 tablet 3   hydrochlorothiazide (MICROZIDE) 12.5 MG capsule Take 1 capsule (12.5 mg total) by mouth daily. 90 capsule 3   HYDROcodone-acetaminophen (NORCO) 10-325 MG tablet Take 1 tablet by mouth. One tablet po 5 times per day as needed     lidocaine (LIDODERM) 5 % Place 2 patches onto the skin daily. Remove & Discard patch within 12 hours or as directed by MD 60 patch 0   losartan (COZAAR) 50 MG tablet Take 50 mg by mouth daily.     mirtazapine (REMERON) 30 MG tablet Take 1 tablet (30 mg total) by mouth at bedtime. 90  tablet 3   NONFORMULARY OR COMPOUNDED ITEM Ketamine 10%, Baclofen 2%, Cyclobenzaprine 2%, Ketoprofen 10%, Gabapentin 6%, Lidocaine 1%, Amitryptiline 5% 1 each 1   pregabalin (LYRICA) 75 MG capsule Take 1 capsule (75 mg total) by mouth 2 (two) times daily. 90 capsule 0   Rivaroxaban (XARELTO) 15 MG TABS tablet Take 1 tablet (15 mg total) by mouth daily with supper. 30 tablet 9   albuterol (VENTOLIN HFA) 108 (90 Base) MCG/ACT inhaler Inhale 2 puffs into the lungs every 8 (eight) hours. 8 g 2   amLODipine (NORVASC) 5 MG tablet Take 1 tablet (5 mg total) by mouth daily. 90 tablet 3   No current facility-administered medications for this visit.    Allergies:   Dicyclomine hcl, Lisinopril, and Aspirin    ROS:  Please see the history of present illness.   Otherwise, review of systems  are positive for none.   All other systems are reviewed and negative.    PHYSICAL EXAM: VS:  BP (!) 181/80    Pulse (!) 48    Ht 5\' 6"  (1.676 m)    Wt 185 lb (83.9 kg)    SpO2 97%    BMI 29.86 kg/m  , BMI Body mass index is 29.86 kg/m. GEN:  No distress NECK:  No jugular venous distention at 90 degrees, waveform within normal limits, carotid upstroke brisk and symmetric, no bruits, no thyromegaly LYMPHATICS:  No cervical adenopathy LUNGS:  Clear to auscultation bilaterally BACK:  No CVA tenderness CHEST:  Mild wheezing HEART:  S1 and S2 within normal limits, no S3, no clicks, no rubs, no murmurs, irregular  ABD:  Positive bowel sounds normal in frequency in pitch, no bruits, no rebound, no guarding, unable to assess midline mass or bruit with the patient seated. EXT:  2 plus pulses throughout, mild ankle edema, no cyanosis no clubbing SKIN:  No rashes no nodules NEURO:  Cranial nerves II through XII grossly intact, motor grossly intact throughout PSYCH:  Cognitively intact, oriented to person place and time   EKG:  EKG is  ordered today. Atrial fibrillation, rate 48, axis within normal limits, low voltage throughout.  No change from previous except the rate is faster   Recent Labs: 04/18/2020: ALT 10 09/03/2020: Hemoglobin 13.3; Platelets 164 09/27/2020: BNP 180.6; BUN 31; Creatinine, Ser 1.78; Potassium 4.3; Sodium 144    Lipid Panel    Component Value Date/Time   CHOL 144 09/11/2019 0855   TRIG 125.0 09/11/2019 0855   HDL 40.70 09/11/2019 0855   CHOLHDL 4 09/11/2019 0855   VLDL 25.0 09/11/2019 0855   LDLCALC 79 09/11/2019 0855      Wt Readings from Last 3 Encounters:  11/08/20 185 lb (83.9 kg)  10/25/20 184 lb (83.5 kg)  10/14/20 181 lb (82.1 kg)      Other studies Reviewed: Additional studies/ records that were reviewed today include:   Labs Review of the above records demonstrates:  Please see elsewhere in the note.     ASSESSMENT AND PLAN:  ATRIAL  FIB:Ms.Mariadejesus B Curtishas a CHA2DS2 - VASc score of 5.    I am stopping the Cardizem.  Her heart rate has been slower.  I am going to check a 3-day monitor.  Other med adjustments are as below.  She tolerates anticoagulation.  HTN: Her BP is elevated.  I am going to restart amlodipine at 5 mg daily.   SOB:  I do not see a cardiac etiology to this.  I am not strongly  suspecting an anginal equivalent.  She did not get better with Z-Pak.  She probably does have some component of mild diastolic dysfunction but unfortunately we have to be careful with diuresis.  She is really not describing PND or orthopnea.  I am going to attempt to see if she has improvement with albuterol inhalers.  I am also sending her back to Myrlene Broker, MD.  She might need pulmonary referral.  She will continue to watch salt and fluid.  CKD IIIB:   Creatinine was 1.78 when last checked.  I am going to repeat labs to include a basic metabolic profile.   COVID EDUCATION: She has had her vaccine.    Current medicines are reviewed at length with the patient today.  The patient does not have concerns regarding medicines.  The following changes have been made: As above  Labs/ tests ordered today include:   Orders Placed This Encounter  Procedures   CBC   Basic metabolic panel   LONG TERM MONITOR (3-14 DAYS)   EKG 12-Lead     Disposition:   FU with me or APP in 3 month.    Signed, Rollene Rotunda, MD  11/08/2020 3:22 PM    [facility]

## 2020-11-08 ENCOUNTER — Ambulatory Visit (INDEPENDENT_AMBULATORY_CARE_PROVIDER_SITE_OTHER): Payer: Medicare HMO

## 2020-11-08 ENCOUNTER — Encounter: Payer: Self-pay | Admitting: *Deleted

## 2020-11-08 ENCOUNTER — Other Ambulatory Visit: Payer: Self-pay

## 2020-11-08 ENCOUNTER — Ambulatory Visit: Payer: Medicare HMO | Admitting: Cardiology

## 2020-11-08 ENCOUNTER — Encounter: Payer: Self-pay | Admitting: Cardiology

## 2020-11-08 VITALS — BP 181/80 | HR 48 | Ht 66.0 in | Wt 185.0 lb

## 2020-11-08 DIAGNOSIS — I10 Essential (primary) hypertension: Secondary | ICD-10-CM

## 2020-11-08 DIAGNOSIS — N1832 Chronic kidney disease, stage 3b: Secondary | ICD-10-CM | POA: Diagnosis not present

## 2020-11-08 DIAGNOSIS — I482 Chronic atrial fibrillation, unspecified: Secondary | ICD-10-CM

## 2020-11-08 MED ORDER — ALBUTEROL SULFATE HFA 108 (90 BASE) MCG/ACT IN AERS: 2.0000 | INHALATION_SPRAY | Freq: Three times a day (TID) | RESPIRATORY_TRACT | 2 refills | Status: DC

## 2020-11-08 MED ORDER — AMLODIPINE BESYLATE 5 MG PO TABS: 5.0000 mg | ORAL_TABLET | Freq: Every day | ORAL | 3 refills | Status: DC

## 2020-11-08 NOTE — Progress Notes (Signed)
Patient ID: Alyssa Lindsey, female   DOB: 09-16-1929, 86 y.o.   MRN: [id number] Patient enrolled for [employer] to ship a 3 day ZIO XT long term holter monitor to her home.

## 2020-11-08 NOTE — Patient Instructions (Addendum)
Medication Instructions:  START AMLODIPINE 5 MG DAILY START ALBUTEROL 2 PUFFS EVERY 8 HOURS *If you need a refill on your cardiac medications before your next appointment, please call your pharmacy*  Lab Work: Your physician recommends that you return for lab work: BMP, CBC If you have labs (blood work) drawn today and your tests are completely normal, you will receive your results only by:  MyChart Message (if you have MyChart) OR  A paper copy in the mail If you have any lab test that is abnormal or we need to change your treatment, we will call you to review the results.  Testing/Procedures: 3 DAY EVENT MONITOR - CALL [phone] IF NEEDING ASSISTANCE WITH PUTTING ON HEART MONITOR  Follow-Up: At [facility], you and your health needs are our priority.  As part of our continuing mission to provide you with exceptional heart care, we have created designated Provider Care Teams.  These Care Teams include your primary Cardiologist (physician) and Advanced Practice Providers (APPs -  Physician Assistants and Nurse Practitioners) who all work together to provide you with the care you need, when you need it.   Your next appointment:   6  week(s)  The format for your next appointment:   In Person  Provider:   Corine Shelter, PA  Other Instructions ZIO XT- Long Term Monitor Instructions   Your physician has requested you wear your ZIO patch monitor_3_days.   This is a single patch monitor.  [employer] supplies one patch monitor per enrollment.  Additional stickers are not available.   Please do not apply patch if you will be having a Nuclear Stress Test, Echocardiogram, Cardiac CT, MRI, or Chest Xray during the time frame you would be wearing the monitor. The patch cannot be worn during these tests.  You cannot remove and re-apply the ZIO XT patch monitor.   Your ZIO patch monitor will be sent USPS Priority mail from [facility] directly to your home address. The monitor may  also be mailed to a PO BOX if home delivery is not available.   It may take 3-5 days to receive your monitor after you have been enrolled.   Once you have received you monitor, please review enclosed instructions.  Your monitor has already been registered assigning a specific monitor serial # to you.   Applying the monitor   Shave hair from upper left chest.   Hold abrader disc by orange tab.  Rub abrader in 40 strokes over left upper chest as indicated in your monitor instructions.   Clean area with 4 enclosed alcohol pads .  Use all pads to assure are is cleaned thoroughly.  Let dry.   Apply patch as indicated in monitor instructions.  Patch will be place under collarbone on left side of chest with arrow pointing upward.   Rub patch adhesive wings for 2 minutes.Remove white label marked "1".  Remove white label marked "2".  Rub patch adhesive wings for 2 additional minutes.   While looking in a mirror, press and release button in center of patch.  A small green light will flash 3-4 times .  This will be your only indicator the monitor has been turned on.     Do not shower for the first 24 hours.  You may shower after the first 24 hours.   Press button if you feel a symptom. You will hear a small click.  Record Date, Time and Symptom in the Patient Log Book.   When you are  ready to remove patch, follow instructions on last 2 pages of Patient Log Book.  Stick patch monitor onto last page of Patient Log Book.   Place Patient Log Book in [place] box.  Use locking tab on box and tape box closed securely.  The [employer] has [employer] on it.  Please place in mailbox as soon as possible.  Your physician should have your test results approximately 7 days after the monitor has been mailed back to [facility].   Call [facility] Customer Care at [phone] if you have questions regarding your ZIO XT patch monitor.  Call them immediately if you see an orange light blinking on  your monitor.   If your monitor falls off in less than 4 days contact our Monitor department at [phone].  If your monitor becomes loose or falls off after 4 days call [employer] at [phone] for suggestions on securing your monitor.

## 2020-11-09 LAB — BASIC METABOLIC PANEL
BUN/Creatinine Ratio: 12 (ref 12–28)
BUN: 18 mg/dL (ref 8–27)
CO2: 22 mmol/L (ref 20–29)
Calcium: 8.9 mg/dL (ref 8.7–10.3)
Chloride: 109 mmol/L — ABNORMAL HIGH (ref 96–106)
Creatinine, Ser: 1.54 mg/dL — ABNORMAL HIGH (ref 0.57–1.00)
GFR calc Af Amer: 34 mL/min/{1.73_m2} — ABNORMAL LOW (ref >59)
GFR calc non Af Amer: 30 mL/min/{1.73_m2} — ABNORMAL LOW (ref >59)
Glucose: 129 mg/dL — ABNORMAL HIGH (ref 65–99)
Potassium: 4.5 mmol/L (ref 3.5–5.2)
Sodium: 143 mmol/L (ref 134–144)

## 2020-11-09 LAB — CBC
Hematocrit: 38 % (ref 34.0–46.6)
Hemoglobin: 12.2 g/dL (ref 11.1–15.9)
MCH: 27.1 pg (ref 26.6–33.0)
MCHC: 32.1 g/dL (ref 31.5–35.7)
MCV: 84 fL (ref 79–97)
NRBC: 1 % — ABNORMAL HIGH (ref 0–0)
RBC: 4.5 x10E6/uL (ref 3.77–5.28)
RDW: 14.6 % (ref 11.7–15.4)
WBC: 5.7 10*3/uL (ref 3.4–10.8)

## 2020-11-10 ENCOUNTER — Ambulatory Visit (INDEPENDENT_AMBULATORY_CARE_PROVIDER_SITE_OTHER): Payer: Medicare HMO | Admitting: Internal Medicine

## 2020-11-10 ENCOUNTER — Other Ambulatory Visit: Payer: Self-pay

## 2020-11-10 ENCOUNTER — Encounter: Payer: Self-pay | Admitting: Internal Medicine

## 2020-11-10 VITALS — BP 130/72 | HR 86 | Temp 98.3°F | Ht 66.0 in

## 2020-11-10 DIAGNOSIS — B0229 Other postherpetic nervous system involvement: Secondary | ICD-10-CM

## 2020-11-10 DIAGNOSIS — R0602 Shortness of breath: Secondary | ICD-10-CM

## 2020-11-10 DIAGNOSIS — N1831 Chronic kidney disease, stage 3a: Secondary | ICD-10-CM

## 2020-11-10 DIAGNOSIS — Z23 Encounter for immunization: Secondary | ICD-10-CM

## 2020-11-10 MED ORDER — ANORO ELLIPTA 62.5-25 MCG/INH IN AEPB: 1.0000 | INHALATION_SPRAY | Freq: Every day | RESPIRATORY_TRACT | 3 refills | Status: DC

## 2020-11-10 NOTE — Patient Instructions (Signed)
We have sent in anoro to try 1 puff daily every day and call us back in 1-2 weeks to let us know how you are doing.

## 2020-11-10 NOTE — Progress Notes (Signed)
   Subjective:   Patient ID: Alyssa Lindsey, female    DOB: June 28, 1929, 86 y.o.   MRN: [id number]  HPI The patient is an 86 YO female coming in for concerns about SOB. Has been having this for some time but overall worsening. Has been struggling with post herpetic neuralgia on the left chest wall and flank/baack for about 1 year. Seeing pain management and they are now using some compounded cream which is helping some. She is sleeping better overall. Some daytime pain still. Seen cardiology several times recently with echo unchanged. They do not believe that her SOB is cardiac in etiology. CT done chest with some scarring in the bases. Gave her albuterol inhaler which helps some for a short time after taking. Denies acute change in SOB. Denies cough or fevers or chills. Has had 2 covid-19 plus booster.   Review of Systems  Constitutional: Negative.   HENT: Negative.   Eyes: Negative.   Respiratory: Positive for shortness of breath. Negative for cough and chest tightness.   Cardiovascular: Positive for chest pain. Negative for palpitations and leg swelling.  Gastrointestinal: Negative for abdominal distention, abdominal pain, constipation, diarrhea, nausea and vomiting.  Musculoskeletal: Positive for myalgias.  Skin: Negative.   Neurological: Negative.   Psychiatric/Behavioral: Negative.     Objective:  Physical Exam Constitutional:      Appearance: She is well-developed and well-nourished.  HENT:     Head: Normocephalic and atraumatic.  Eyes:     Extraocular Movements: EOM normal.  Cardiovascular:     Rate and Rhythm: Normal rate and regular rhythm.  Pulmonary:     Effort: Pulmonary effort is normal. No respiratory distress.     Breath sounds: Normal breath sounds. No wheezing or rales.  Chest:     Chest wall: Tenderness present.  Abdominal:     General: Bowel sounds are normal. There is no distension.     Palpations: Abdomen is soft.     Tenderness: There is no abdominal  tenderness. There is no rebound.  Musculoskeletal:        General: No edema.     Cervical back: Normal range of motion.  Skin:    General: Skin is warm and dry.  Neurological:     Mental Status: She is alert and oriented to person, place, and time.     Coordination: Coordination normal.  Psychiatric:        Mood and Affect: Mood and affect normal.     Vitals:   11/10/20 0922  BP: 130/72  Pulse: 86  Temp: 98.3 F (36.8 C)  TempSrc: Oral  SpO2: 95%  Height: 5\' 6"  (1.676 m)    This visit occurred during the SARS-CoV-2 public health emergency.  Safety protocols were in place, including screening questions prior to the visit, additional usage of staff PPE, and extensive cleaning of exam room while observing appropriate contact time as indicated for disinfecting solutions.   Flu shot given at visit  Assessment & Plan:  Visit time 30 minutes in face to face communication with patient and coordination of care, additional 5 minutes spent in record review, coordination or care, ordering tests, communicating/referring to other healthcare professionals, documenting in medical records all on the same day of the visit for total time 35 minutes spent on the visit.

## 2020-11-11 ENCOUNTER — Telehealth: Payer: Self-pay | Admitting: Cardiology

## 2020-11-11 NOTE — Assessment & Plan Note (Signed)
Noted most recent GFR improved to 34 so will remove 3b CKD from list and keep only 3a CKD.

## 2020-11-11 NOTE — Telephone Encounter (Signed)
    [name] returning call to get pt's results

## 2020-11-11 NOTE — Assessment & Plan Note (Signed)
Will try anoro to see if this in combination with albuterol prn can help. If not needs PFTs and pulmonary referral. If this does work we may still do PFTs to quantify breathing difficulties.

## 2020-11-11 NOTE — Telephone Encounter (Signed)
Daughter updated with lab results and verbalized understanding.

## 2020-11-11 NOTE — Assessment & Plan Note (Signed)
Seeing pain management and is finally getting some relief from the pain. She is still not pain free but is sleeping better at night time.

## 2020-11-22 ENCOUNTER — Other Ambulatory Visit: Payer: Self-pay

## 2020-11-22 ENCOUNTER — Encounter: Payer: Medicare HMO | Attending: Physical Medicine & Rehabilitation | Admitting: Physical Medicine & Rehabilitation

## 2020-11-22 ENCOUNTER — Encounter: Payer: Self-pay | Admitting: Physical Medicine & Rehabilitation

## 2020-11-22 ENCOUNTER — Ambulatory Visit: Payer: Medicare HMO | Admitting: Cardiology

## 2020-11-22 VITALS — BP 155/93 | HR 78 | Temp 98.4°F | Ht 66.0 in | Wt 181.4 lb

## 2020-11-22 DIAGNOSIS — B0229 Other postherpetic nervous system involvement: Secondary | ICD-10-CM | POA: Diagnosis not present

## 2020-11-22 DIAGNOSIS — G479 Sleep disorder, unspecified: Secondary | ICD-10-CM | POA: Diagnosis not present

## 2020-11-22 MED ORDER — PREGABALIN 75 MG PO CAPS
75.0000 mg | ORAL_CAPSULE | Freq: Two times a day (BID) | ORAL | 1 refills | Status: DC
Start: 2020-11-22 — End: 2020-12-21

## 2020-11-22 MED ORDER — AMITRIPTYLINE HCL 25 MG PO TABS: 25.0000 mg | ORAL_TABLET | Freq: Every day | ORAL | 1 refills | Status: DC

## 2020-11-22 MED ORDER — CAPSAICIN 0.075 % EX STCK
CUTANEOUS | 1 refills | Status: DC
Start: 2020-11-22 — End: 2022-02-07

## 2020-11-22 NOTE — Progress Notes (Signed)
Subjective:    Patient ID: Alyssa Lindsey, female    DOB: [birth year], 86 y.o.   MRN: [id number]  HPI Female with past medical history of DM type II, HTN, OA, dementia, shingles presents with postherpetic neuralgia in left scapula.   Initially stated: ?Niece supplements history due to dementia. Started 08/2019.  She had shingles at that time.  No alleviating or exacerbating factors.  Sore, sensitive.  Radiates to left breast. Intermittent.  Tylenol provides some relief. She has tried oxycodone without benefit. Denies associated numbness. Denies falls. Pain limits bathing.   Last clinic visit on 10/25/2020. Limited historian. Family supplements history. Since that time, patient states she continues to take Lyrica.  She states good relief with compound medication. She notes improvement in bathing. She has not kept a diary.  She is taking Elavil with benefit.   Pain Inventory Average Pain 0 Pain Right Now 0 My pain is sharp, burning, stabbing and occasional from shingles  In the last 24 hours, has pain interfered with the following? General activity 0 Relation with others 0 Enjoyment of life 0 What TIME of day is your pain at its worst? varies Sleep (in general) Good  Pain is worse with: unsure Pain improves with: rest, medication and cream Relief from Meds: 8     Family History  Problem Relation Age of Onset  . Emphysema Brother   . Stroke Father 6  . Ovarian cancer Sister   . Breast cancer Sister   . Liver disease Brother   . Colon cancer Daughter   . Diabetes Daughter   . Throat cancer Brother   . Lung cancer Daughter    Social History   Socioeconomic History  . Marital status: Widowed    Spouse name: Not on file  . Number of children: 4  . Years of education: Not on file  . Highest education level: Not on file  Occupational History  . Occupation: Retired Advertising copywriter  Tobacco Use  . Smoking status: Never Smoker  . Smokeless tobacco: Never Used  Substance and  Sexual Activity  . Alcohol use: No    Alcohol/week: 0.0 standard drinks  . Drug use: No  . Sexual activity: Not on file  Other Topics Concern  . Not on file  Social History Narrative   Widow    4 children: 1 son '59; 3 dtrs '53, '62, '64; 9 grandchildren; great-grand   Lives with dtr at home, grandson at home      Daily Caffeine Use:  1 cup   Regular Exercise -  NO         Social Determinants of Health   Financial Resource Strain: Not on file  Food Insecurity: Not on file  Transportation Needs: Not on file  Physical Activity: Not on file  Stress: Not on file  Social Connections: Not on file   Past Surgical History:  Procedure Laterality Date  . COLONOSCOPY  2010   NORMAL-- DUE 2020  . TUBAL LIGATION     Past Medical History:  Diagnosis Date  . Arthritis   . Chronic neck pain   . Diverticulosis of colon (without mention of hemorrhage)   . Family history of malignant neoplasm of gastrointestinal tract   . Gastritis   . Hiatal hernia   . HTN (hypertension)   . LBP (low back pain)   . Memory problem   . Spondylosis   . Type II or unspecified type diabetes mellitus without mention of complication, not stated as uncontrolled  BP (!) 155/93   Pulse 78   Temp 98.4 F (36.9 C)   Ht 5\' 6"  (1.676 m)   Wt 181 lb 6.4 oz (82.3 kg)   SpO2 94%   BMI 29.28 kg/m   Opioid Risk Score:   Fall Risk Score:  `1  Depression screen PHQ 2/9  Depression screen [facility] 2/9 11/22/2020 11/10/2020 09/27/2020 09/06/2020 09/11/2019 11/21/2018 08/13/2017  Decreased Interest 0 0 0 1 0 0 0  Down, Depressed, Hopeless 0 0 0 0 0 0 0  PHQ - 2 Score 0 0 0 1 0 0 0  Altered sleeping - - - 0 - - -  Tired, decreased energy - - - 0 - - -  Change in appetite - - - 0 - - -  Feeling bad or failure about yourself  - - - 0 - - -  Trouble concentrating - - - 0 - - -  Moving slowly or fidgety/restless - - - 0 - - -  Suicidal thoughts - - - 0 - - -  PHQ-9 Score - - - 1 - - -  Difficult doing  work/chores - - - Not difficult at all - - -  Some recent data might be hidden    Review of Systems  Constitutional: Negative.   HENT: Negative.   Eyes: Negative.   Respiratory: Negative.   Cardiovascular: Negative.   Gastrointestinal: Negative.   Endocrine: Negative.   Genitourinary: Negative.   Musculoskeletal: Positive for arthralgias and myalgias.  Skin: Negative.   Allergic/Immunologic: Negative.   Neurological:       Neuropathic pain/ shingles from under left breast to scapula  Hematological: Negative.   Psychiatric/Behavioral: Positive for sleep disturbance.  All other systems reviewed and are negative.     Objective:   Physical Exam  Constitutional: No distress . Vital signs reviewed. HENT: Normocephalic.  Atraumatic. Eyes: EOMI. No discharge. Cardiovascular: No JVD.   Respiratory: Normal effort.  No stridor.   GI: Non-distended.   Skin: Warm and dry.  Herpetic lesions in T4 distribution. Psych: Normal mood.  Normal behavior. Musc: Back TTP Neuro: Alert Motor: 5/5 b/l UE Allodynia left medial scapula, improving, able to don purse on that side    Assessment & Plan:  Female with past medical history of DM type II, HTN, OA, dementia, shingles presents with postherpetic neuralgia in left scapula.   1.Postherpetic neuralgia  Continue cold  Patient has TENS, encouraged use  ?Limited benefit with Lidoderm patch  Continue Lyrica to 75 BID  Will consider Cymbalta  Ordered capsacin cream, reminded  Will consider lidocaine ointment  Will consider block  Continue compound medication  Will consider Accupuncture  Patient states main goal is to bathe more effectively, improving  Encouraged diary for pain in conjunction with meds  Will consider PNS  2. Sleep disturbance  Will increase Elavil to 25mg  qhs, educated on signs/symptoms of serotonin syndrome again  See #1  3. Myalgia   Will consider point injections

## 2020-11-23 ENCOUNTER — Ambulatory Visit: Payer: Medicare HMO | Admitting: Cardiology

## 2020-11-24 ENCOUNTER — Telehealth: Payer: Self-pay | Admitting: Internal Medicine

## 2020-11-24 NOTE — Telephone Encounter (Signed)
   Patient's daughter calling to report patient is SOB  Call transferred to [facility]

## 2020-11-24 NOTE — Telephone Encounter (Signed)
[facility] FYI   Caller states her mother has some SOB and has had this for a while. She has been seeing her cardiologist for it. Just now she took her trash out and she had to sit down to catch her breath, did not use her inhaler today. Scheduled to see PCP on Tuesday and she may need a lung function test.   [facility] advised: See PCP within 2 weeks.   Patient is scheduled for 11/29/2020

## 2020-11-28 DIAGNOSIS — I482 Chronic atrial fibrillation, unspecified: Secondary | ICD-10-CM | POA: Diagnosis not present

## 2020-11-29 ENCOUNTER — Telehealth (INDEPENDENT_AMBULATORY_CARE_PROVIDER_SITE_OTHER): Payer: Medicare HMO | Admitting: Internal Medicine

## 2020-11-29 ENCOUNTER — Encounter: Payer: Self-pay | Admitting: Internal Medicine

## 2020-11-29 DIAGNOSIS — R0602 Shortness of breath: Secondary | ICD-10-CM

## 2020-11-29 NOTE — Assessment & Plan Note (Signed)
Advised to get covid-19 booster. Ordered PFTs and pulmonary referral as previously discussed. Although she has had some benefit from anoro not resolved. Continue anoro pending PFTs.

## 2020-11-29 NOTE — Progress Notes (Signed)
Virtual Visit via Audio Note  I connected with Alyssa Lindsey on 11/29/20 at 10:40 AM EST by an audio-only enabled telemedicine application and verified that I am speaking with the correct person using two identifiers.  The patient and the provider were at separate locations throughout the entire encounter. Patient location: home, Provider location: work   I discussed the limitations of evaluation and management by telemedicine and the availability of in person appointments. The patient expressed understanding and agreed to proceed. The patient and the provider were the only parties present for the visit unless noted in HPI below.  History of Present Illness: The patient is a 87 y.o. female with visit for SOB. Started a while ago and given rx for anoro about 1 month ago. Has tried that and it helped some. Has had cardiac evaluation without cause. Denies worsening symptoms or chest pains. Still suffering from post herpetic neuralgia left chest wall but this is gradually improving with treatment. Overall it is stable. Has tried anoro which provides some relief. Has had 2 covid-19 vaccines and waiting on booster but wants to get now.  Observations/Objective: Voice strong, A and O times 3, no coughing or dyspnea during visit, speaking in full sentences  Assessment and Plan: See problem oriented charting  Follow Up Instructions: referral to pulmonary and PFTs ordered today  Visit time 11 minutes in non-face to face communication with patient and coordination of care  I discussed the assessment and treatment plan with the patient. The patient was provided an opportunity to ask questions and all were answered. The patient agreed with the plan and demonstrated an understanding of the instructions.   The patient was advised to call back or seek an in-person evaluation if the symptoms worsen or if the condition fails to improve as anticipated.  Myrlene Broker, MD

## 2020-12-08 ENCOUNTER — Telehealth: Payer: Self-pay | Admitting: Internal Medicine

## 2020-12-08 NOTE — Telephone Encounter (Signed)
Patient's daughter informed of below. She now has a covid test scheduled for this saturday  12/10/20 and her PFT is scheduled on 12/13/20. Virtual visit scheduled w/ PCP on 12/09/20 @ 9:40am.

## 2020-12-08 NOTE — Telephone Encounter (Signed)
Patients daughter calling, wants to speak to someone about updating them on her mothers conditions, states that she thinks her condition is worsening, would not expand any further. [phone]

## 2020-12-08 NOTE — Telephone Encounter (Signed)
Can do virtual visit to assess as this sounds to be a significant change from last week. I am unsure if testing is available sooner that would be through [facility] website.

## 2020-12-08 NOTE — Telephone Encounter (Signed)
I spoke to pt's daughter- she states pt's inhalers do not seem to be working as pt is very [employer]. Her O2 sat was at 94% during ambulation.   She is going to call pulmonary and see if they can see patient sooner than 12/28/20.   She wants to know if she can schedule a covid test sooner than 12/24/20?

## 2020-12-09 ENCOUNTER — Encounter: Payer: Self-pay | Admitting: Internal Medicine

## 2020-12-09 ENCOUNTER — Telehealth (INDEPENDENT_AMBULATORY_CARE_PROVIDER_SITE_OTHER): Payer: Medicare HMO | Admitting: Internal Medicine

## 2020-12-09 VITALS — Ht 66.0 in | Wt 181.0 lb

## 2020-12-09 DIAGNOSIS — R0602 Shortness of breath: Secondary | ICD-10-CM | POA: Diagnosis not present

## 2020-12-09 MED ORDER — PREDNISONE 20 MG PO TABS: 20.0000 mg | ORAL_TABLET | Freq: Every day | ORAL | 0 refills | Status: AC

## 2020-12-09 MED ORDER — ALBUTEROL SULFATE (2.5 MG/3ML) 0.083% IN NEBU: 2.5000 mg | INHALATION_SOLUTION | Freq: Four times a day (QID) | RESPIRATORY_TRACT | 1 refills | Status: DC | PRN

## 2020-12-09 NOTE — Progress Notes (Signed)
DME request sent to [facility] for Neb machine

## 2020-12-09 NOTE — Progress Notes (Signed)
Virtual Visit via Video Note  I connected with Alyssa Lindsey on 12/09/20 at  9:40 AM EST by a video enabled telemedicine application and verified that I am speaking with the correct person using two identifiers.  The patient and the provider were at separate locations throughout the entire encounter. Patient location: home, Provider location: work   I discussed the limitations of evaluation and management by telemedicine and the availability of in person appointments. The patient expressed understanding and agreed to proceed. The patient and the provider were the only parties present for the visit unless noted in HPI below.  History of Present Illness: The patient is a 87 y.o. female with visit for worsening SOB. Started in the last week or so getting some worse. She is also feeling lightheaded and weak in the last few days. She is getting covid-19 test tomorrow and does not have transport to go today to our office for testing. She is still taking anoro and using albuterol inhaler. She does feel she has a hard time with coordinating breath. Albuterol does help some. Does have cough non-productive. Denies fevers or chills or known sick contact.   Observations/Objective: Appearance: normal, appears tired and lying in chair, breathing appears normal, speaking in full sentences, casual grooming, abdomen does not appear distended, mental status is A and O times 3  Assessment and Plan: See problem oriented charting  Follow Up Instructions: rx prednisone 20 mg daily for 5 days, rx nebulizer and albuterol solution  I discussed the assessment and treatment plan with the patient. The patient was provided an opportunity to ask questions and all were answered. The patient agreed with the plan and demonstrated an understanding of the instructions.   The patient was advised to call back or seek an in-person evaluation if the symptoms worsen or if the condition fails to improve as anticipated.  Myrlene Broker, MD

## 2020-12-09 NOTE — Assessment & Plan Note (Signed)
Scheduled for PFTs next week and covid-19 testing tomorrow. Advised that if covid-19 test positive PFTs would need to be delayed. Rx nebulizer and albuterol solution as she is having difficulty in the mechanism of the albuterol inhaler. Continue anoro for now.

## 2020-12-10 ENCOUNTER — Other Ambulatory Visit (HOSPITAL_COMMUNITY)
Admission: RE | Admit: 2020-12-10 | Discharge: 2020-12-10 | Disposition: A | Discharge status: Home / Self Care | Payer: Medicare HMO | Source: Ambulatory Visit | Attending: Internal Medicine | Admitting: Internal Medicine

## 2020-12-10 DIAGNOSIS — Z20822 Contact with and (suspected) exposure to covid-19: Secondary | ICD-10-CM | POA: Diagnosis not present

## 2020-12-10 DIAGNOSIS — Z01812 Encounter for preprocedural laboratory examination: Secondary | ICD-10-CM | POA: Insufficient documentation

## 2020-12-10 LAB — SARS CORONAVIRUS 2 (TAT 6-24 HRS): SARS Coronavirus 2: NEGATIVE

## 2020-12-12 ENCOUNTER — Telehealth: Payer: Self-pay | Admitting: Internal Medicine

## 2020-12-12 NOTE — Progress Notes (Signed)
DME request confirmed receipt by [facility] on 12/09/20 at 1037.

## 2020-12-12 NOTE — Telephone Encounter (Signed)
No need for additional virtual visit. Nebulizer machine was ordered on Friday. She has upcoming lung function test she should keep that visit.

## 2020-12-12 NOTE — Telephone Encounter (Signed)
LVM to inform.

## 2020-12-12 NOTE — Telephone Encounter (Signed)
[facility] Call/Report : ---Caller states she is calling for a pt who was given albuterol. They do not have a machine to use it. The patient has shortness of breath. had tele visit yesterday. no chest pain . no stiffness. when patient walks to the bathroom she out of breath. just tested for covid no results yet.  Advised see PCP with in 2 or 3. LOV: 12/09/20 Covid test - Negative. Do you want patient to have another VV or in OV?

## 2020-12-13 ENCOUNTER — Telehealth: Payer: Self-pay | Admitting: Internal Medicine

## 2020-12-13 ENCOUNTER — Ambulatory Visit (INDEPENDENT_AMBULATORY_CARE_PROVIDER_SITE_OTHER): Payer: Medicare HMO | Admitting: Internal Medicine

## 2020-12-13 ENCOUNTER — Other Ambulatory Visit: Payer: Self-pay

## 2020-12-13 DIAGNOSIS — R0602 Shortness of breath: Secondary | ICD-10-CM

## 2020-12-13 LAB — PULMONARY FUNCTION TEST
DL/VA % pred: 133 %
DL/VA: 5.3 ml/min/mmHg/L
DLCO cor % pred: 58 %
DLCO cor: 11.45 ml/min/mmHg
DLCO unc % pred: 58 %
DLCO unc: 11.45 ml/min/mmHg
FEF 25-75 Post: 0.57 L/sec
FEF 25-75 Pre: 1.4 L/sec
FEF2575-%Change-Post: -59 %
FEF2575-%Pred-Post: 50 %
FEF2575-%Pred-Pre: 125 %
FEV1-%Change-Post: -15 %
FEV1-%Pred-Post: 48 %
FEV1-%Pred-Pre: 57 %
FEV1-Post: 0.72 L
FEV1-Pre: 0.85 L
FEV1FVC-%Change-Post: -4 %
FEV1FVC-%Pred-Pre: 122 %
FEV6-%Change-Post: -10 %
FEV6-%Pred-Post: 46 %
FEV6-%Pred-Pre: 51 %
FEV6-Post: 0.84 L
FEV6-Pre: 0.94 L
FEV6FVC-%Change-Post: 0 %
FEV6FVC-%Pred-Post: 104 %
FEV6FVC-%Pred-Pre: 104 %
FVC-%Change-Post: -10 %
FVC-%Pred-Post: 44 %
FVC-%Pred-Pre: 49 %
FVC-Post: 0.84 L
FVC-Pre: 0.95 L
Post FEV1/FVC ratio: 86 %
Post FEV6/FVC ratio: 100 %
Pre FEV1/FVC ratio: 90 %
Pre FEV6/FVC Ratio: 100 %
RV % pred: 77 %
RV: 2.07 L
TLC % pred: 61 %
TLC: 3.32 L

## 2020-12-13 NOTE — Telephone Encounter (Signed)
Patients daughter [name] calling when the patient will be receiving the nebulizer, states the patient really needs it.

## 2020-12-13 NOTE — Progress Notes (Signed)
Full PFT performed today. °

## 2020-12-14 NOTE — Telephone Encounter (Signed)
Spoke with Tresa Endo at [facility] and she stated that they did receive her prescription on 12/09/2020 however insurance is not wanting to pay for the prescription until 12/20/2020.  Spoke with the patient's daughter [name] and made her aware that insurance is not wanting to pay for the prescription until 12/20/2020. She stated that her mother does have a appointment with Pulmonology tomorrow. She mentioned that she would give [facility] a call. No other questions or concerns at this time.

## 2020-12-15 ENCOUNTER — Ambulatory Visit: Payer: Medicare HMO | Admitting: Internal Medicine

## 2020-12-15 ENCOUNTER — Encounter: Payer: Self-pay | Admitting: Internal Medicine

## 2020-12-15 ENCOUNTER — Inpatient Hospital Stay (HOSPITAL_COMMUNITY)
Admission: EM | Admit: 2020-12-15 | Discharge: 2020-12-17 | DRG: 244 | Disposition: A | Discharge status: Home Health | Payer: Medicare HMO | Attending: Cardiology | Admitting: Cardiology

## 2020-12-15 ENCOUNTER — Encounter (HOSPITAL_COMMUNITY): Payer: Self-pay

## 2020-12-15 ENCOUNTER — Other Ambulatory Visit: Payer: Self-pay

## 2020-12-15 ENCOUNTER — Emergency Department (HOSPITAL_COMMUNITY): Payer: Medicare HMO

## 2020-12-15 VITALS — BP 116/64 | HR 37 | Temp 97.0°F | Ht 65.0 in | Wt 187.0 lb

## 2020-12-15 DIAGNOSIS — R001 Bradycardia, unspecified: Secondary | ICD-10-CM | POA: Diagnosis not present

## 2020-12-15 DIAGNOSIS — J90 Pleural effusion, not elsewhere classified: Secondary | ICD-10-CM | POA: Diagnosis not present

## 2020-12-15 DIAGNOSIS — E119 Type 2 diabetes mellitus without complications: Secondary | ICD-10-CM

## 2020-12-15 DIAGNOSIS — I482 Chronic atrial fibrillation, unspecified: Secondary | ICD-10-CM | POA: Diagnosis not present

## 2020-12-15 DIAGNOSIS — Z7901 Long term (current) use of anticoagulants: Secondary | ICD-10-CM

## 2020-12-15 DIAGNOSIS — I4891 Unspecified atrial fibrillation: Secondary | ICD-10-CM | POA: Diagnosis not present

## 2020-12-15 DIAGNOSIS — E876 Hypokalemia: Secondary | ICD-10-CM | POA: Diagnosis present

## 2020-12-15 DIAGNOSIS — I10 Essential (primary) hypertension: Secondary | ICD-10-CM | POA: Diagnosis not present

## 2020-12-15 DIAGNOSIS — I4430 Unspecified atrioventricular block: Secondary | ICD-10-CM | POA: Diagnosis not present

## 2020-12-15 DIAGNOSIS — I4821 Permanent atrial fibrillation: Secondary | ICD-10-CM | POA: Diagnosis present

## 2020-12-15 DIAGNOSIS — R0609 Other forms of dyspnea: Secondary | ICD-10-CM

## 2020-12-15 DIAGNOSIS — Z20822 Contact with and (suspected) exposure to covid-19: Secondary | ICD-10-CM | POA: Diagnosis present

## 2020-12-15 DIAGNOSIS — Z833 Family history of diabetes mellitus: Secondary | ICD-10-CM

## 2020-12-15 DIAGNOSIS — K5730 Diverticulosis of large intestine without perforation or abscess without bleeding: Secondary | ICD-10-CM | POA: Diagnosis present

## 2020-12-15 DIAGNOSIS — Z950 Presence of cardiac pacemaker: Secondary | ICD-10-CM

## 2020-12-15 DIAGNOSIS — R0902 Hypoxemia: Secondary | ICD-10-CM | POA: Diagnosis not present

## 2020-12-15 DIAGNOSIS — R0600 Dyspnea, unspecified: Secondary | ICD-10-CM | POA: Diagnosis not present

## 2020-12-15 DIAGNOSIS — I442 Atrioventricular block, complete: Principal | ICD-10-CM | POA: Diagnosis present

## 2020-12-15 DIAGNOSIS — Z79899 Other long term (current) drug therapy: Secondary | ICD-10-CM

## 2020-12-15 DIAGNOSIS — K219 Gastro-esophageal reflux disease without esophagitis: Secondary | ICD-10-CM | POA: Diagnosis present

## 2020-12-15 DIAGNOSIS — I499 Cardiac arrhythmia, unspecified: Secondary | ICD-10-CM | POA: Diagnosis not present

## 2020-12-15 DIAGNOSIS — Z7951 Long term (current) use of inhaled steroids: Secondary | ICD-10-CM

## 2020-12-15 DIAGNOSIS — Z886 Allergy status to analgesic agent status: Secondary | ICD-10-CM

## 2020-12-15 DIAGNOSIS — Z888 Allergy status to other drugs, medicaments and biological substances status: Secondary | ICD-10-CM

## 2020-12-15 DIAGNOSIS — I517 Cardiomegaly: Secondary | ICD-10-CM | POA: Diagnosis not present

## 2020-12-15 DIAGNOSIS — R0602 Shortness of breath: Secondary | ICD-10-CM | POA: Diagnosis not present

## 2020-12-15 HISTORY — DX: Thrombocytopenia, unspecified: D69.6

## 2020-12-15 HISTORY — DX: Unspecified atrial fibrillation: I48.91

## 2020-12-15 HISTORY — DX: Gastro-esophageal reflux disease without esophagitis: K21.9

## 2020-12-15 LAB — CBC WITH DIFFERENTIAL/PLATELET
Abs Immature Granulocytes: 0.06 10*3/uL (ref 0.00–0.07)
Basophils Absolute: 0 10*3/uL (ref 0.0–0.1)
Basophils Relative: 1 %
Eosinophils Absolute: 0.3 10*3/uL (ref 0.0–0.5)
Eosinophils Relative: 5 %
HCT: 40.2 % (ref 36.0–46.0)
Hemoglobin: 13 g/dL (ref 12.0–15.0)
Immature Granulocytes: 1 %
Lymphocytes Relative: 20 %
Lymphs Abs: 1.3 10*3/uL (ref 0.7–4.0)
MCH: 27.7 pg (ref 26.0–34.0)
MCHC: 32.3 g/dL (ref 30.0–36.0)
MCV: 85.5 fL (ref 80.0–100.0)
Monocytes Absolute: 0.7 10*3/uL (ref 0.1–1.0)
Monocytes Relative: 10 %
Neutro Abs: 4.1 10*3/uL (ref 1.7–7.7)
Neutrophils Relative %: 63 %
Platelets: 136 10*3/uL — ABNORMAL LOW (ref 150–400)
RBC: 4.7 MIL/uL (ref 3.87–5.11)
RDW: 19.2 % — ABNORMAL HIGH (ref 11.5–15.5)
WBC: 6.4 10*3/uL (ref 4.0–10.5)
nRBC: 0 % (ref 0.0–0.2)

## 2020-12-15 LAB — COMPREHENSIVE METABOLIC PANEL
ALT: 60 U/L — ABNORMAL HIGH (ref 0–44)
AST: 31 U/L (ref 15–41)
Albumin: 3.6 g/dL (ref 3.5–5.0)
Alkaline Phosphatase: 77 U/L (ref 38–126)
Anion gap: 8 (ref 5–15)
BUN: 20 mg/dL (ref 8–23)
CO2: 26 mmol/L (ref 22–32)
Calcium: 9 mg/dL (ref 8.9–10.3)
Chloride: 110 mmol/L (ref 98–111)
Creatinine, Ser: 1.42 mg/dL — ABNORMAL HIGH (ref 0.44–1.00)
GFR, Estimated: 35 mL/min — ABNORMAL LOW (ref >60)
Glucose, Bld: 121 mg/dL — ABNORMAL HIGH (ref 70–99)
Potassium: 3.8 mmol/L (ref 3.5–5.1)
Sodium: 144 mmol/L (ref 135–145)
Total Bilirubin: 1.2 mg/dL (ref 0.3–1.2)
Total Protein: 6.3 g/dL — ABNORMAL LOW (ref 6.5–8.1)

## 2020-12-15 LAB — TROPONIN I (HIGH SENSITIVITY)
Troponin I (High Sensitivity): 25 ng/L — ABNORMAL HIGH (ref <18)
Troponin I (High Sensitivity): 28 ng/L — ABNORMAL HIGH (ref <18)

## 2020-12-15 LAB — BRAIN NATRIURETIC PEPTIDE: B Natriuretic Peptide: 699 pg/mL — ABNORMAL HIGH (ref 0.0–100.0)

## 2020-12-15 LAB — SARS CORONAVIRUS 2 BY RT PCR (HOSPITAL ORDER, PERFORMED IN ~~LOC~~ HOSPITAL LAB): SARS Coronavirus 2: NEGATIVE

## 2020-12-15 MED ORDER — ONDANSETRON HCL 4 MG/2ML IJ SOLN: 4.0000 mg | Freq: Four times a day (QID) | INTRAMUSCULAR | Status: DC | PRN

## 2020-12-15 MED ORDER — SODIUM CHLORIDE 0.9 % IV SOLN: INTRAVENOUS | Status: DC

## 2020-12-15 MED ORDER — SODIUM CHLORIDE 0.45 % IV SOLN: INTRAVENOUS | Status: DC

## 2020-12-15 MED ORDER — UMECLIDINIUM-VILANTEROL 62.5-25 MCG/INH IN AEPB
1.0000 | INHALATION_SPRAY | Freq: Every day | RESPIRATORY_TRACT | Status: DC
  Filled 2020-12-15: qty 14

## 2020-12-15 MED ORDER — ALBUTEROL SULFATE HFA 108 (90 BASE) MCG/ACT IN AERS: 2.0000 | INHALATION_SPRAY | Freq: Three times a day (TID) | RESPIRATORY_TRACT | Status: DC

## 2020-12-15 MED ORDER — LORATADINE 10 MG PO TABS
10.0000 mg | ORAL_TABLET | Freq: Every day | ORAL | Status: DC
  Administered 2020-12-16 – 2020-12-17 (×2): 10 mg via ORAL
  Filled 2020-12-15 (×2): qty 1

## 2020-12-15 MED ORDER — ACETAMINOPHEN 325 MG PO TABS: 650.0000 mg | ORAL_TABLET | ORAL | Status: DC | PRN

## 2020-12-15 MED ORDER — CEFAZOLIN SODIUM-DEXTROSE 2-4 GM/100ML-% IV SOLN
2.0000 g | INTRAVENOUS | Status: AC
  Administered 2020-12-16: 2 g via INTRAVENOUS

## 2020-12-15 MED ORDER — MIRTAZAPINE 30 MG PO TABS
30.0000 mg | ORAL_TABLET | Freq: Every day | ORAL | Status: DC
  Administered 2020-12-15 – 2020-12-16 (×2): 30 mg via ORAL
  Filled 2020-12-15 (×3): qty 1

## 2020-12-15 MED ORDER — PREGABALIN 75 MG PO CAPS
75.0000 mg | ORAL_CAPSULE | Freq: Two times a day (BID) | ORAL | Status: DC
  Administered 2020-12-15 – 2020-12-17 (×4): 75 mg via ORAL
  Filled 2020-12-15 (×4): qty 1

## 2020-12-15 MED ORDER — ALBUTEROL SULFATE (2.5 MG/3ML) 0.083% IN NEBU: 2.5000 mg | INHALATION_SOLUTION | Freq: Four times a day (QID) | RESPIRATORY_TRACT | Status: DC | PRN

## 2020-12-15 MED ORDER — FAMOTIDINE 20 MG PO TABS
20.0000 mg | ORAL_TABLET | Freq: Two times a day (BID) | ORAL | Status: DC
  Administered 2020-12-15 – 2020-12-16 (×2): 20 mg via ORAL
  Filled 2020-12-15 (×2): qty 1

## 2020-12-15 MED ORDER — SODIUM CHLORIDE 0.9 % IV SOLN
80.0000 mg | INTRAVENOUS | Status: AC
  Administered 2020-12-16: 80 mg

## 2020-12-15 NOTE — ED Triage Notes (Signed)
Pt bib [facility]. Pt was at pulmonologist and was found to have a heart rate in the 30s. Pt has been having intermittent mid-chest/left shoulder/back pain and shortness of breath for a month. Pt was on cardizem for chronic afib which was stopped a month ago due to decreased HR. Pt A&Ox3 on arrival, denies shortness of breath or pain on arrival.  EMS VS: BP=176/90 HR=36 02=98% room air CBG=132

## 2020-12-15 NOTE — Assessment & Plan Note (Signed)
Her pfts are restrictive and her wheezing today is all upper airway  Appears to be in complete heart block despite stopping amlodipine > discussed with Dr Antoine Poche, will transport via EMS to [facility] for cards eval.          Each maintenance medication was reviewed in detail including emphasizing most importantly the difference between maintenance and prns and under what circumstances the prns are to be triggered using an action plan format where appropriate.  Total time for H and P, chart review, counseling, reviewing  and generating customized AVS unique to this office visit / same day charting > 60 min

## 2020-12-15 NOTE — ED Provider Notes (Signed)
Medical screening examination/treatment/procedure(s) were conducted as a shared visit with non-physician practitioner(s) and myself.  I personally evaluated the patient during the encounter. Briefly, the patient is a 87 y.o. female with heart failure, hypertension who presents to the ED with bradycardia, shortness of breath.  Patient hypertensive and bradycardic in the forties.  Was seen at pulmonology clinic today and was concerned on EKG for high degree heart block.  EKG here either very slow A. fib versus a high degree heart block.  She has been following for similar with cardiology and recently stopped her Cardizem.  She is not on any rate control medication.  She is on Xarelto.  She appears overloaded on exam.  Has had some intermittent shortness of breath and chest pain.  We will touch base with cardiology about their interpretation of EKG as she might need a pacemaker.  We will check basic labs as well.  This chart was dictated using voice recognition software.  Despite best efforts to proofread,  errors can occur which can change the documentation meaning.     EKG Interpretation  Date/Time:  Thursday December 15 2020 17:15:59 EST Ventricular Rate:  40 PR Interval:    QRS Duration: 107 QT Interval:  584 QTC Calculation: 477 R Axis:   -10 Text Interpretation: A-flutter/fibrillation w/ complete AV block ? RSR' in V1 or V2, right VCD or RVH Nonspecific T abnormalities, lateral leads Borderline prolonged QT interval Confirmed by Virgina Norfolk [phone]) on 12/15/2020 5:30:51 PM          Virgina Norfolk, DO 12/15/20 [id number]

## 2020-12-15 NOTE — Progress Notes (Signed)
Alyssa Lindsey, female    DOB: Nov 19, 1929    MRN: [id number]   Brief patient profile:  35 yowf never smoker with new onset doe Dec 2020  Assoc with shingles s obvious explanation with neg w/u by Cards (Dr Antoine Poche who d/c'd cardizem due to bradycardia 11/08/20)  with restrictive changes only by pfts 12/13/20 so referred to pulmonary clinic 12/15/2020 by Dr   Alyssa Lindsey with pulse rate  In low 30's on initial eval.    History of Present Illness  12/15/2020  Pulmonary/ 1st office eval/Alyssa Lindsey  Chief Complaint  Patient presents with  . Consult    Shortness of breath at rest at times and with activity   Dyspnea:  Room to room since oct/nov assoc with wheezing  Cough: none  Sleep:  Bed is flat/ 2 pillows SABA use: does not have neb machined, just the solution, worse p saba for pfts, worse started anoro.  No obvious day to day or daytime variability or assoc excess/ purulent sputum or mucus plugs or hemoptysis or cp or chest tightness  or overt sinus or hb symptoms.   Sleeping as above  without nocturnal  or early am exacerbation  of respiratory  c/o's or need for noct saba. Also denies any obvious fluctuation of symptoms with weather or environmental changes or other aggravating or alleviating factors except as outlined above   No unusual exposure hx or h/o childhood pna/ asthma or knowledge of premature birth.  Current Allergies, Complete Past Medical History, Past Surgical History, Family History, and Social History were reviewed in [employer] record.  ROS  The following are not active complaints unless bolded Hoarseness, sore throat, dysphagia, dental problems, itching, sneezing,  nasal congestion or discharge of excess mucus or purulent secretions, ear ache,   fever, chills, sweats, unintended wt loss or wt gain, classically pleuritic or exertional cp,  orthopnea pnd or arm/hand swelling  or leg swelling, presyncope, palpitations, abdominal pain, anorexia, nausea,  vomiting, diarrhea  or change in bowel habits or change in bladder habits, change in stools or change in urine, dysuria, hematuria,  rash, arthralgias, visual complaints, headache, numbness, weakness or ataxia or problems with walking or coordination,  change in mood or  memory.           Past Medical History:  Diagnosis Date  . Arthritis   . Chronic neck pain   . Diverticulosis of colon (without mention of hemorrhage)   . Family history of malignant neoplasm of gastrointestinal tract   . Gastritis   . Hiatal hernia   . HTN (hypertension)   . LBP (low back pain)   . Memory problem   . Spondylosis   . Type II or unspecified type diabetes mellitus without mention of complication, not stated as uncontrolled     Outpatient Medications Prior to Visit  Medication Sig Dispense Refill  . albuterol (PROVENTIL) (2.5 MG/3ML) 0.083% nebulizer solution Take 3 mLs (2.5 mg total) by nebulization every 6 (six) hours as needed for wheezing or shortness of breath. 150 mL 1  . albuterol (VENTOLIN HFA) 108 (90 Base) MCG/ACT inhaler Inhale 2 puffs into the lungs every 8 (eight) hours. 8 g 2  . amLODipine (NORVASC) 5 MG tablet Take 1 tablet (5 mg total) by mouth daily. 90 tablet 3  . Capsaicin 0.075 % STCK .075% topically twice per day 1 g 1  . cetirizine (ZYRTEC) 10 MG tablet TAKE ONE TABLET BY MOUTH ONCE DAILY 30 tablet 11  . famotidine (  PEPCID) 20 MG tablet Take 1 tablet (20 mg total) by mouth 2 (two) times daily. 60 tablet 2  . furosemide (LASIX) 20 MG tablet Take 1 tablet (20 mg total) by mouth as needed for edema. 90 tablet 3  . lidocaine (LIDODERM) 5 % Place 2 patches onto the skin daily. Remove & Discard patch within 12 hours or as directed by MD 60 patch 0  . losartan (COZAAR) 50 MG tablet Take 50 mg by mouth daily.    . mirtazapine (REMERON) 30 MG tablet Take 1 tablet (30 mg total) by mouth at bedtime. 90 tablet 3  . NONFORMULARY OR COMPOUNDED ITEM Ketamine 10%, Baclofen 2%, Cyclobenzaprine 2%,  Ketoprofen 10%, Gabapentin 6%, Lidocaine 1%, Amitryptiline 5% 1 each 1  . pregabalin (LYRICA) 75 MG capsule Take 1 capsule (75 mg total) by mouth 2 (two) times daily. 90 capsule 1  . Rivaroxaban (XARELTO) 15 MG TABS tablet Take 1 tablet (15 mg total) by mouth daily with supper. 30 tablet 9  . umeclidinium-vilanterol (ANORO ELLIPTA) 62.5-25 MCG/INH AEPB Inhale 1 puff into the lungs daily. 30 each 3  . amitriptyline (ELAVIL) 25 MG tablet Take 1 tablet (25 mg total) by mouth at bedtime. 30 tablet 1  . hydrochlorothiazide (MICROZIDE) 12.5 MG capsule Take 1 capsule (12.5 mg total) by mouth daily. (Patient not taking: Reported on 12/15/2020) 90 capsule 3   No facility-administered medications prior to visit.     Objective:     BP 116/64 (BP Location: Right Arm, Cuff Size: Normal)   Pulse (!) 37   Temp (!) 97 F (36.1 C) (Other (Comment)) Comment (Src): wrist  Ht 5\' 5"  (1.651 m)   Wt 187 lb (84.8 kg)   SpO2 99% Comment: Room air  BMI 31.12 kg/m   SpO2: 99 % (Room air)   Obese bf nad in w/c with confirmed pulse in 30s and prominent pseudowheeze   HEENT : pt wearing mask not removed for exam due to covid -19 concerns.    NECK :  without JVD/Nodes/TM/ nl carotid upstrokes bilaterally   LUNGS: no acc muscle use,  Nl contour chest which transmitted upper airway "wheeze"  without cough on insp or exp maneuvers   CV: RRR at around 30   no s3 or murmur or increase in P2, and 1+ pitting bilaterally sym LE edema  ABD:  Quite obese soft and nontender with nl inspiratory excursion in the supine position. No bruits or organomegaly appreciated, bowel sounds nl  MS:  ext warm without deformities, calf tenderness, cyanosis or clubbing No obvious joint restrictions   SKIN: warm and dry without lesions    NEURO:  alert, approp, nl sensorium with  no motor or cerebellar deficits apparent.    EKG  12/15/2020   prob complete av block       Assessment   DOE (dyspnea on exertion) Her pfts are  restrictive and her wheezing today is all upper airway  Appears to be in complete heart block despite stopping amlodipine > discussed with Dr 12/17/2020, will transport via EMS to [facility] for cards eval.          Each maintenance medication was reviewed in detail including emphasizing most importantly the difference between maintenance and prns and under what circumstances the prns are to be triggered using an action plan format where appropriate.  Total time for H and P, chart review, counseling, reviewing  and generating customized AVS unique to this office visit / same day charting > 60 min  Sandrea Hughs, MD 12/15/2020

## 2020-12-15 NOTE — ED Provider Notes (Signed)
[facility] EMERGENCY DEPARTMENT Provider Note   CSN: [id number] Arrival date & time: 12/15/20  1651     History Chief Complaint  Patient presents with  . Bradycardia    Alyssa Lindsey is a 87 y.o. female presenting for evaluation of chest pain, shortness of breath, lightheadedness.  Patient states she was sent to the ER from her doctor's office due to concerns for her heart.  She states she has had lightheadedness/dizziness upon standing, shortness of breath and intermittent chest pain.  She is not currently feeling short of breath or having chest pain.  Her symptoms are worse when she is standing or exerting.  She follows with [facility].  She denies recent fevers, chills, cough, nausea, vomiting, abdominal pain, urinary symptoms, abnormal bowel movements.  Additional history taken chart review.  Patient with a history of A. fib on Xarelto, GERD, hypertension, memory problems, diabetes.  Reviewed visit with pulmonology today, and recommendations from cardiology that she be evaluated in the ER.  Reviewed recent medication changes including stopping the diltiazem due to bradycardia in December 2021  HPI     Past Medical History:  Diagnosis Date  . Arthritis   . Atrial fibrillation (HCC)   . Chronic neck pain   . Diverticulosis of colon (without mention of hemorrhage)   . Family history of malignant neoplasm of gastrointestinal tract   . Gastritis   . GERD (gastroesophageal reflux disease)   . Hiatal hernia   . HTN (hypertension)   . LBP (low back pain)   . Memory problem   . Spondylosis   . Thrombocytopenia (HCC)   . Type II or unspecified type diabetes mellitus without mention of complication, not stated as uncontrolled     Patient Active Problem List   Diagnosis Date Noted  . Symptomatic bradycardia 12/15/2020  . Myalgia 10/25/2020  . Stage 3a chronic kidney disease (HCC) 09/18/2020  . Leg swelling 09/18/2020  . Sleep disturbance 09/06/2020   . Chronic atrial fibrillation (HCC) 04/20/2020  . Thrombocytopenia (HCC) 04/18/2020  . Blood in sputum 04/18/2020  . Educated about COVID-19 virus infection 04/12/2020  . DOE (dyspnea on exertion) 04/12/2020  . Permanent atrial fibrillation (HCC) 04/12/2020  . PHN (postherpetic neuralgia) 12/02/2019  . Breast pain, left 10/06/2019  . Constipation 11/18/2017  . Adjustment disorder with mixed anxiety and depressed mood 07/06/2016  . Low back pain 05/09/2016  . Numbness and tingling of both legs 04/25/2016  . Bilateral leg edema 04/11/2016  . Routine general medical examination at a health care facility 12/26/2015  . GERD (gastroesophageal reflux disease) 03/02/2015  . Right knee pain 03/04/2014  . Chronic LLQ pain 02/07/2012  . B12 deficiency 03/06/2010  . 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 THYROID NODULE 03/03/2008  . BREAST MASS 03/03/2008  . Diabetes type 2, controlled (HCC) 06/21/2007  . Essential hypertension 06/21/2007    Past Surgical History:  Procedure Laterality Date  . COLONOSCOPY  2010   NORMAL-- DUE 2020  . TUBAL LIGATION       OB History   No obstetric history on file.     Family History  Problem Relation Age of Onset  . Emphysema Brother   . Stroke Father 24  . Ovarian cancer Sister   . Breast cancer Sister   . Liver disease Brother   . Colon cancer Daughter   . Diabetes Daughter   . Throat cancer Brother   . Lung cancer Daughter     Social History  Tobacco Use  . Smoking status: Never Smoker  . Smokeless tobacco: Never Used  Substance Use Topics  . Alcohol use: No    Alcohol/week: 0.0 standard drinks  . Drug use: No    Home Medications Prior to Admission medications   Medication Sig Start Date End Date Taking? Authorizing Provider  albuterol (PROVENTIL) (2.5 MG/3ML) 0.083% nebulizer solution Take 3 mLs (2.5 mg total) by nebulization every 6 (six) hours as needed for wheezing or shortness of breath. 12/09/20   Myrlene Broker, MD  albuterol (VENTOLIN HFA) 108 (90 Base) MCG/ACT inhaler Inhale 2 puffs into the lungs every 8 (eight) hours. 11/08/20   Rollene Rotunda, MD  amLODipine (NORVASC) 5 MG tablet Take 1 tablet (5 mg total) by mouth daily. 11/08/20 02/06/21  Rollene Rotunda, MD  Capsaicin 0.075 % STCK .075% topically twice per day 11/22/20   Marcello Fennel, MD  cetirizine (ZYRTEC) 10 MG tablet TAKE ONE TABLET BY MOUTH ONCE DAILY 10/25/16   Myrlene Broker, MD  famotidine (PEPCID) 20 MG tablet Take 1 tablet (20 mg total) by mouth 2 (two) times daily. 09/18/19   Myrlene Broker, MD  furosemide (LASIX) 20 MG tablet Take 1 tablet (20 mg total) by mouth as needed for edema. 02/25/20 09/27/21  Azalee Course, PA  lidocaine (LIDODERM) 5 % Place 2 patches onto the skin daily. Remove & Discard patch within 12 hours or as directed by MD 09/06/20   Marcello Fennel, MD  losartan (COZAAR) 50 MG tablet Take 50 mg by mouth daily. 09/29/20   [provider]  mirtazapine (REMERON) 30 MG tablet Take 1 tablet (30 mg total) by mouth at bedtime. 06/27/20   Myrlene Broker, MD  NONFORMULARY OR COMPOUNDED ITEM Ketamine 10%, Baclofen 2%, Cyclobenzaprine 2%, Ketoprofen 10%, Gabapentin 6%, Lidocaine 1%, Amitryptiline 5% 10/25/20   Allena Katz, Maryln Gottron, MD  pregabalin (LYRICA) 75 MG capsule Take 1 capsule (75 mg total) by mouth 2 (two) times daily. 11/22/20   Marcello Fennel, MD  Rivaroxaban (XARELTO) 15 MG TABS tablet Take 1 tablet (15 mg total) by mouth daily with supper. 12/30/19   Jodelle Gross, NP  umeclidinium-vilanterol (ANORO ELLIPTA) 62.5-25 MCG/INH AEPB Inhale 1 puff into the lungs daily. 11/10/20   Myrlene Broker, MD    Allergies    Dicyclomine hcl, Lisinopril, and Aspirin  Review of Systems   Review of Systems  Respiratory: Positive for shortness of breath (with exertion).   Neurological: Positive for light-headedness (with exertion).  Hematological: Bruises/bleeds easily.  All other  systems reviewed and are negative.   Physical Exam Updated Vital Signs BP (!) 199/93   Pulse (!) 41   Temp 98.2 F (36.8 C) (Oral)   Resp 14   Ht 5' (1.524 m)   Wt 84.8 kg   SpO2 100%   BMI 36.52 kg/m   Physical Exam Vitals and nursing note reviewed.  Constitutional:      General: She is not in acute distress.    Appearance: She is well-developed and well-nourished.     Comments: Resting in the bed in NAD  HENT:     Head: Normocephalic and atraumatic.  Eyes:     Extraocular Movements: EOM normal.     Conjunctiva/sclera: Conjunctivae normal.     Pupils: Pupils are equal, round, and reactive to light.  Cardiovascular:     Rate and Rhythm: Regular rhythm. Bradycardia present.     Pulses: Normal pulses and intact distal pulses.  Pulmonary:  Effort: Pulmonary effort is normal. No respiratory distress.     Breath sounds: Normal breath sounds. No wheezing.  Abdominal:     General: There is no distension.     Palpations: Abdomen is soft. There is no mass.     Tenderness: There is no abdominal tenderness. There is no guarding or rebound.  Musculoskeletal:        General: Normal range of motion.     Cervical back: Normal range of motion and neck supple.     Right lower leg: Edema present.     Left lower leg: Edema present.     Comments: Mild bilateral leg swelling  Skin:    General: Skin is warm and dry.     Capillary Refill: Capillary refill takes less than 2 seconds.  Neurological:     Mental Status: She is alert and oriented to person, place, and time.  Psychiatric:        Mood and Affect: Mood and affect normal.     ED Results / Procedures / Treatments   Labs (all labs ordered are listed, but only abnormal results are displayed) Labs Reviewed  CBC WITH DIFFERENTIAL/PLATELET - Abnormal; Notable for the following components:      Result Value   RDW 19.2 (*)    Platelets 136 (*)    All other components within normal limits  COMPREHENSIVE METABOLIC PANEL -  Abnormal; Notable for the following components:   Glucose, Bld 121 (*)    Creatinine, Ser 1.42 (*)    Total Protein 6.3 (*)    ALT 60 (*)    GFR, Estimated 35 (*)    All other components within normal limits  BRAIN NATRIURETIC PEPTIDE - Abnormal; Notable for the following components:   B Natriuretic Peptide 699.0 (*)    All other components within normal limits  TROPONIN I (HIGH SENSITIVITY) - Abnormal; Notable for the following components:   Troponin I (High Sensitivity) 25 (*)    All other components within normal limits  SARS CORONAVIRUS 2 BY RT PCR (HOSPITAL ORDER, PERFORMED IN [facility])  URINALYSIS, ROUTINE W REFLEX MICROSCOPIC  BASIC METABOLIC PANEL  TROPONIN I (HIGH SENSITIVITY)    EKG EKG Interpretation  Date/Time:  Thursday December 15 2020 17:15:59 EST Ventricular Rate:  40 PR Interval:    QRS Duration: 107 QT Interval:  584 QTC Calculation: 477 R Axis:   -10 Text Interpretation: A-flutter/fibrillation w/ complete AV block ? RSR' in V1 or V2, right VCD or RVH Nonspecific T abnormalities, lateral leads Borderline prolonged QT interval Confirmed by Virgina Norfolk [phone]) on 12/15/2020 5:30:51 PM   Radiology DG Chest Portable 1 View  Result Date: 12/15/2020 CLINICAL DATA:  Short of breath EXAM: PORTABLE CHEST 1 VIEW COMPARISON:  09/03/2020 FINDINGS: Mild cardiomegaly with central congestion. Suspected small left pleural effusion. Airspace disease at the left base. No pneumothorax. Aortic atherosclerosis. IMPRESSION: 1. Mild cardiomegaly with central congestion. 2. Probable small left pleural effusion with left basilar atelectasis or pneumonia. Electronically Signed   By: Jasmine Pang M.D.   On: 12/15/2020 17:55    Procedures Procedures (including critical care time)  Medications Ordered in ED Medications  mirtazapine (REMERON) tablet 30 mg (has no administration in time range)  famotidine (PEPCID) tablet 20 mg (has no administration in time range)   pregabalin (LYRICA) capsule 75 mg (has no administration in time range)  albuterol (PROVENTIL) (2.5 MG/3ML) 0.083% nebulizer solution 2.5 mg (has no administration in time range)  loratadine (CLARITIN) tablet  10 mg (has no administration in time range)  umeclidinium-vilanterol (ANORO ELLIPTA) 62.5-25 MCG/INH 1 puff (1 puff Inhalation Not Given 12/15/20 1855)  acetaminophen (TYLENOL) tablet 650 mg (has no administration in time range)  ondansetron (ZOFRAN) injection 4 mg (has no administration in time range)    ED Course  I have reviewed the triage vital signs and the nursing notes.  Pertinent labs & imaging results that were available during my care of the patient were reviewed by me and considered in my medical decision making (see chart for details).    MDM Rules/Calculators/A&P                          Pt presenting for evaluation of symptomatic bradycardiac. On exam, pt appears nontoxic, but she is bradycardic ~40. Will order labs, ekg, cxr. She will need cards consult.   Labs interpreted by me, overall reassuring.  BNP mildly elevated, patient does appear slightly fluid overloaded on exam.  Chest x-ray shows cardiomegaly.  Creatinine at baseline.  EKG shows bradycardia, question slow A. fib versus complete heart block.  Case discussed with attending, Dr. Lockie Molauratolo evaluated the patient.  Will consult with cardiology.  Discussed with cardiology, who will evaluate and admit patient.  Final Clinical Impression(s) / ED Diagnoses Final diagnoses:  Symptomatic bradycardia    Rx / DC Orders ED Discharge Orders    None       Alveria ApleyCaccavale, Akaiya Touchette, PA-C 12/15/20 1931    Virgina NorfolkCuratolo, Adam, DO 12/15/20 2013

## 2020-12-15 NOTE — ED Notes (Signed)
Attempted to call report, no answer

## 2020-12-15 NOTE — H&P (Addendum)
Cardiology Admission History and Physical:   Patient ID: Alyssa Lindsey MRN: [id number]; DOB: 1929/03/02   Admission date: 12/15/2020  Primary Care Provider: Myrlene Broker, MD [facility] Cardiologist: Rollene Rotunda, MD  [facility] Electrophysiologist:  None   Chief Complaint:  Dyspnea/bradycardia  Patient Profile:   Alyssa Lindsey is a 87 y.o. female with PMH of chronic afib, GERD, HTN, DM2 who presented to the ED from pulmonologist office with concern for symptomatic bradycardia.   History of Present Illness:   Alyssa Lindsey is an 87 yo female with PMH noted above. She is followed by Dr. Antoine Poche as an outpatient. She has a history suspected permanent Afib on xarelto and was last seen by Dr. Antoine Poche in clinic on 11/08/20. During that visit, she was noted to have HR 40s in slow Afib. Her diltiazem was discontinued at that time and she has been off nodal blocking agents. She also had undergone a thorough work-up for SOB. CT chest was negative for acute process. TTE demonstrated normal EF, severe LVH of basal-septal segment, no significant valvular abnormalities. She was following up with Pulm today where she was noted to have restrictive PFTs and wheezing in upper airway. Given HR in 30s, however, she was sent to ED for further visit.  Currently, the patient is comfortable. States other than her shortness of breath, she has no lightheadedness, fatigue, decreased exercise tolerance, chest pressure or orthopnea. No palpitations. She is not currently taking any nodal blocking agents. Has not taken xarelto today.    Past Medical History:  Diagnosis Date  . Arthritis   . Atrial fibrillation (HCC)   . Chronic neck pain   . Diverticulosis of colon (without mention of hemorrhage)   . Family history of malignant neoplasm of gastrointestinal tract   . Gastritis   . GERD (gastroesophageal reflux disease)   . Hiatal hernia   . HTN (hypertension)   . LBP (low back pain)   .  Memory problem   . Spondylosis   . Thrombocytopenia (HCC)   . Type II or unspecified type diabetes mellitus without mention of complication, not stated as uncontrolled     Past Surgical History:  Procedure Laterality Date  . COLONOSCOPY  2010   NORMAL-- DUE 2020  . TUBAL LIGATION       Medications Prior to Admission: Prior to Admission medications   Medication Sig Start Date End Date Taking? Authorizing Provider  albuterol (PROVENTIL) (2.5 MG/3ML) 0.083% nebulizer solution Take 3 mLs (2.5 mg total) by nebulization every 6 (six) hours as needed for wheezing or shortness of breath. 12/09/20   Myrlene Broker, MD  albuterol (VENTOLIN HFA) 108 (90 Base) MCG/ACT inhaler Inhale 2 puffs into the lungs every 8 (eight) hours. 11/08/20   Rollene Rotunda, MD  amLODipine (NORVASC) 5 MG tablet Take 1 tablet (5 mg total) by mouth daily. 11/08/20 02/06/21  Rollene Rotunda, MD  Capsaicin 0.075 % STCK .075% topically twice per day 11/22/20   Marcello Fennel, MD  cetirizine (ZYRTEC) 10 MG tablet TAKE ONE TABLET BY MOUTH ONCE DAILY 10/25/16   Myrlene Broker, MD  famotidine (PEPCID) 20 MG tablet Take 1 tablet (20 mg total) by mouth 2 (two) times daily. 09/18/19   Myrlene Broker, MD  furosemide (LASIX) 20 MG tablet Take 1 tablet (20 mg total) by mouth as needed for edema. 02/25/20 09/27/21  Azalee Course, PA  lidocaine (LIDODERM) 5 % Place 2 patches onto the skin daily. Remove & Discard patch within  12 hours or as directed by MD 09/06/20   Marcello Fennel, MD  losartan (COZAAR) 50 MG tablet Take 50 mg by mouth daily. 09/29/20   [provider]  mirtazapine (REMERON) 30 MG tablet Take 1 tablet (30 mg total) by mouth at bedtime. 06/27/20   Myrlene Broker, MD  NONFORMULARY OR COMPOUNDED ITEM Ketamine 10%, Baclofen 2%, Cyclobenzaprine 2%, Ketoprofen 10%, Gabapentin 6%, Lidocaine 1%, Amitryptiline 5% 10/25/20   Allena Katz, Maryln Gottron, MD  pregabalin (LYRICA) 75 MG capsule Take 1 capsule (75  mg total) by mouth 2 (two) times daily. 11/22/20   Marcello Fennel, MD  Rivaroxaban (XARELTO) 15 MG TABS tablet Take 1 tablet (15 mg total) by mouth daily with supper. 12/30/19   Jodelle Gross, NP  umeclidinium-vilanterol (ANORO ELLIPTA) 62.5-25 MCG/INH AEPB Inhale 1 puff into the lungs daily. 11/10/20   Myrlene Broker, MD     Allergies:    Allergies  Allergen Reactions  . Dicyclomine Hcl Other (See Comments)    Severe eye pain  . Lisinopril Cough  . Aspirin Other (See Comments)    Unknown reaction    Social History:   Social History   Socioeconomic History  . Marital status: Widowed    Spouse name: Not on file  . Number of children: 4  . Years of education: Not on file  . Highest education level: Not on file  Occupational History  . Occupation: Retired Advertising copywriter  Tobacco Use  . Smoking status: Never Smoker  . Smokeless tobacco: Never Used  Substance and Sexual Activity  . Alcohol use: No    Alcohol/week: 0.0 standard drinks  . Drug use: No  . Sexual activity: Not on file  Other Topics Concern  . Not on file  Social History Narrative   Widow    4 children: 1 son '59; 3 dtrs '53, '62, '64; 9 grandchildren; great-grand   Lives with dtr at home, grandson at home      Daily Caffeine Use:  1 cup   Regular Exercise -  NO         Social Determinants of Health   Financial Resource Strain: Not on file  Food Insecurity: Not on file  Transportation Needs: Not on file  Physical Activity: Not on file  Stress: Not on file  Social Connections: Not on file  Intimate Partner Violence: Not on file    Family History:   The patient's family history includes Breast cancer in her sister; Colon cancer in her daughter; Diabetes in her daughter; Emphysema in her brother; Liver disease in her brother; Lung cancer in her daughter; Ovarian cancer in her sister; Stroke (age of onset: 58) in her father; Throat cancer in her brother.    ROS:  Please see the history of  present illness.  Review of Systems  Constitutional: Negative for chills and fever.  HENT: Negative for nosebleeds.   Eyes: Negative for blurred vision.  Respiratory: Positive for shortness of breath and wheezing.   Cardiovascular: Positive for leg swelling. Negative for chest pain, palpitations, orthopnea and claudication.  Gastrointestinal: Negative for nausea and vomiting.  Genitourinary: Negative for frequency.  Musculoskeletal: Positive for myalgias.  Neurological: Negative for dizziness and loss of consciousness.  Endo/Heme/Allergies: Negative for polydipsia.  Psychiatric/Behavioral: Negative for substance abuse.    Physical Exam/Data:   Vitals:   12/15/20 1712 12/15/20 1713  BP: (!) 206/84   Pulse: (!) 40   Resp: 14   Temp: 98.2 F (36.8 C)  TempSrc: Oral   SpO2: 100%   Weight:  84.8 kg  Height:  5' (1.524 m)   No intake or output data in the 24 hours ending 12/15/20 1808 Last 3 Weights 12/15/2020 12/15/2020 12/09/2020  Weight (lbs) 187 lb 187 lb 181 lb  Weight (kg) 84.823 kg 84.823 kg 82.101 kg     Body mass index is 36.52 kg/m.  General:  Well nourished, well developed, in no acute distress HEENT: normal Neck: no JVD Vascular: No carotid bruits; FA pulses 2+ bilaterally without bruits  Cardiac:  Bradycardic, irregular, no murmurs. Lungs:  clear to auscultation bilaterally, no wheezing, rhonchi or rales  Abd: soft, nontender, no hepatomegaly  Ext: Trace edema to the ankle. Warm. Musculoskeletal:  No deformities, BUE and BLE strength normal and equal Skin: warm and dry  Neuro:  CNs 2-12 intact, no focal abnormalities noted Psych:  Normal affect    EKG:  The ECG that was done 12/15/20 was personally reviewed and demonstrates Afib 40bpm  Relevant CV Studies:  Echo: 04/18/20 IMPRESSIONS  1. Left ventricular ejection fraction, by estimation, is 60 to 65%. The  left ventricle has normal function. The left ventricle has no regional  wall motion abnormalities.  There is severe left ventricular hypertrophy of  the basal-septal segment. Left  ventricular diastolic parameters are indeterminate.  2. Right ventricular systolic function is normal. The right ventricular  size is normal. There is mildly elevated pulmonary artery systolic  pressure.  3. The mitral valve is normal in structure. Mild mitral valve  regurgitation. No evidence of mitral stenosis.  4. The aortic valve is normal in structure. Aortic valve regurgitation is  not visualized. No aortic stenosis is present.  5. The inferior vena cava is normal in size with greater than 50%  respiratory variability, suggesting right atrial pressure of 3 mmHg.  CT chest 10/07/20: FINDINGS: Cardiovascular: No acute findings. Aortic atherosclerotic calcification noted. Mild cardiomegaly.  Mediastinum/Nodes: No masses or pathologically enlarged lymph nodes identified on this unenhanced exam.  Lungs/Pleura: No suspicious nodules or masses identified. No evidence of infiltrate or pleural effusion. Mild bilateral lower lobe scarring and peripheral bronchiectasis are again seen. 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 This shows no significant change compared to previous chest CTA.  Upper Abdomen:  Unremarkable.  Musculoskeletal:  No suspicious bone lesions.  IMPRESSION: No evidence of pulmonary neoplasm or other active lung disease.  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  Stable mild bilateral lower lobe scarring and traction bronchiectasis.  Aortic Atherosclerosis (ICD10-I70.0).   Laboratory Data:  High Sensitivity Troponin:  No results for input(s): TROPONINIHS in the last 720 hours.    ChemistryNo results for input(s): NA, K, CL, CO2, GLUCOSE, BUN, CREATININE, CALCIUM, GFRNONAA, GFRAA, ANIONGAP in the last 168 hours.  No results for input(s): PROT, ALBUMIN, AST, ALT,  ALKPHOS, BILITOT in the last 168 hours. HematologyNo results for input(s): WBC, RBC, HGB, HCT, MCV, MCH, MCHC, RDW, PLT in the last 168 hours. BNPNo results for input(s): BNP, PROBNP in the last 168 hours.  DDimer No results for input(s): DDIMER in the last 168 hours.   Radiology/Studies:  DG Chest Portable 1 View  Result Date: 12/15/2020 CLINICAL DATA:  Short of breath EXAM: PORTABLE CHEST 1 VIEW COMPARISON:  09/03/2020 FINDINGS: Mild cardiomegaly with central congestion. Suspected small left pleural effusion. Airspace disease at the left base. No pneumothorax. Aortic atherosclerosis. IMPRESSION:  1. Mild cardiomegaly with central congestion. 2. Probable small left pleural effusion with left basilar atelectasis or pneumonia. Electronically Signed   By: Jasmine PangKim  Fujinaga M.D.   On: 12/15/2020 17:55     Assessment and Plan:   Alyssa Lindsey is a 87 y.o. female with PMH of chronic afib, GERD, HTN, DM2 who presented to the ED from pulmonologist office with concern for symptomatic bradycardia found to be in slow Afib  #Symptomatic bradycardia #Afib with slow VR:   Patient with known history of suspected permanent Afib now with HR in the 30s-40s. CHADs-vasc 5. Not currently on any nodal blocking agents. Was previously on dilt in 10/2020 which was held due to HR in 40s during visit at that time. Fortunately, she is HD stable. Reports shortness of breath which has been ongoing for her but no lightheadedness, dizziness, syncope, chest pain, or significant fatigue. She is on chronic AC with xarelto but has not taken dose today. TTE in 03/2020 with normal EF, no significant valvular abnormalities. -Patient HD stable with no indication for emergent pacing at this time -Plan for PPM tomorrow with EP -Last TTE 03/2020 with preserved EF, no significant valvular disease -Apply pacer pads at bedside in case decompensates overnight -Hold xarelto tonight--resume post-op per EP -Not on nodal agents -Follow-up  lytes and keep K>4, Mg>2 -Will tolerate elevated blood pressures as do not want to drop her low given high risk of decompensation with HR in 30-40s  #SOB: Likely multifactorial in the setting of restrictive lung disease on PFTs and underlying bradycardia as detailed above. TTE with normal EF and no significant valvular abnormalities. CT chest with no acute findgins. -Manage symptomatic bradycardia as above -Continue home inhalers -Follow-up with pulm as scheduled  #HTN: Elevated currently.  -Will tolerate higher blood pressures given significant bradycardia -Add back home meds as tolerated post-PPM placement   Risk Assessment/Risk Scores:       CHA2DS2-VASc Score = 5      :[id number]}This indicates a 7.2% annual risk of stroke. The patient's score is based upon: CHF History: No HTN History: Yes Diabetes History: No Stroke History: No Vascular Disease History: Yes Age Score: 2 Gender Score: 1    Severity of Illness: The appropriate patient status for this patient is INPATIENT. Inpatient status is judged to be reasonable and necessary in order to provide the required intensity of service to ensure the patient's safety. The patient's presenting symptoms, physical exam findings, and initial radiographic and laboratory data in the context of their chronic comorbidities is felt to place them at high risk for further clinical deterioration. Furthermore, it is not anticipated that the patient will be medically stable for discharge from the hospital within 2 midnights of admission. The following factors support the patient status of inpatient.   " The patient's presenting symptoms include bradycardia. " The worrisome physical exam findings include bradycardia and expiratory wheezing. " The initial radiographic and laboratory data are worrisome because of afib with slow ventricular response. " The chronic co-morbidities include Afib, HTN.   * I certify that at the point of admission it is  my clinical judgment that the patient will require inpatient hospital care spanning beyond 2 midnights from the point of admission due to high intensity of service, high risk for further deterioration and high frequency of surveillance required.*    For questions or updates, please contact [facility] Please consult [email or web address] for contact info under     Signed, Laurance FlattenHeather Mariella Blackwelder, MD 12/15/2020 6:08 PM

## 2020-12-16 ENCOUNTER — Encounter (HOSPITAL_COMMUNITY)
Admission: EM | Disposition: A | Discharge status: Home Health | Payer: Self-pay | Source: Home / Self Care | Attending: Cardiology

## 2020-12-16 ENCOUNTER — Encounter (HOSPITAL_COMMUNITY): Payer: Self-pay | Admitting: Internal Medicine

## 2020-12-16 DIAGNOSIS — Z7951 Long term (current) use of inhaled steroids: Secondary | ICD-10-CM | POA: Diagnosis not present

## 2020-12-16 DIAGNOSIS — R531 Weakness: Secondary | ICD-10-CM | POA: Diagnosis not present

## 2020-12-16 DIAGNOSIS — E119 Type 2 diabetes mellitus without complications: Secondary | ICD-10-CM | POA: Diagnosis not present

## 2020-12-16 DIAGNOSIS — Z950 Presence of cardiac pacemaker: Secondary | ICD-10-CM | POA: Diagnosis not present

## 2020-12-16 DIAGNOSIS — Z833 Family history of diabetes mellitus: Secondary | ICD-10-CM | POA: Diagnosis not present

## 2020-12-16 DIAGNOSIS — I442 Atrioventricular block, complete: Principal | ICD-10-CM

## 2020-12-16 DIAGNOSIS — I10 Essential (primary) hypertension: Secondary | ICD-10-CM | POA: Diagnosis not present

## 2020-12-16 DIAGNOSIS — Z7901 Long term (current) use of anticoagulants: Secondary | ICD-10-CM | POA: Diagnosis not present

## 2020-12-16 DIAGNOSIS — Z888 Allergy status to other drugs, medicaments and biological substances status: Secondary | ICD-10-CM | POA: Diagnosis not present

## 2020-12-16 DIAGNOSIS — R001 Bradycardia, unspecified: Secondary | ICD-10-CM | POA: Diagnosis not present

## 2020-12-16 DIAGNOSIS — Z886 Allergy status to analgesic agent status: Secondary | ICD-10-CM | POA: Diagnosis not present

## 2020-12-16 DIAGNOSIS — E876 Hypokalemia: Secondary | ICD-10-CM | POA: Diagnosis not present

## 2020-12-16 DIAGNOSIS — Z20822 Contact with and (suspected) exposure to covid-19: Secondary | ICD-10-CM | POA: Diagnosis not present

## 2020-12-16 DIAGNOSIS — I4821 Permanent atrial fibrillation: Secondary | ICD-10-CM | POA: Diagnosis not present

## 2020-12-16 DIAGNOSIS — I517 Cardiomegaly: Secondary | ICD-10-CM | POA: Diagnosis not present

## 2020-12-16 DIAGNOSIS — K219 Gastro-esophageal reflux disease without esophagitis: Secondary | ICD-10-CM | POA: Diagnosis not present

## 2020-12-16 DIAGNOSIS — Z79899 Other long term (current) drug therapy: Secondary | ICD-10-CM | POA: Diagnosis not present

## 2020-12-16 DIAGNOSIS — K5730 Diverticulosis of large intestine without perforation or abscess without bleeding: Secondary | ICD-10-CM | POA: Diagnosis not present

## 2020-12-16 HISTORY — PX: PACEMAKER IMPLANT: EP1218

## 2020-12-16 LAB — SURGICAL PCR SCREEN
MRSA, PCR: NEGATIVE
Staphylococcus aureus: NEGATIVE

## 2020-12-16 LAB — BASIC METABOLIC PANEL
Anion gap: 11 (ref 5–15)
BUN: 18 mg/dL (ref 8–23)
CO2: 23 mmol/L (ref 22–32)
Calcium: 8.6 mg/dL — ABNORMAL LOW (ref 8.9–10.3)
Chloride: 108 mmol/L (ref 98–111)
Creatinine, Ser: 1.28 mg/dL — ABNORMAL HIGH (ref 0.44–1.00)
GFR, Estimated: 40 mL/min — ABNORMAL LOW (ref >60)
Glucose, Bld: 124 mg/dL — ABNORMAL HIGH (ref 70–99)
Potassium: 3.4 mmol/L — ABNORMAL LOW (ref 3.5–5.1)
Sodium: 142 mmol/L (ref 135–145)

## 2020-12-16 SURGERY — PACEMAKER IMPLANT

## 2020-12-16 MED ORDER — ONDANSETRON HCL 4 MG/2ML IJ SOLN: 4.0000 mg | Freq: Four times a day (QID) | INTRAMUSCULAR | Status: DC | PRN

## 2020-12-16 MED ORDER — FENTANYL CITRATE (PF) 100 MCG/2ML IJ SOLN
INTRAMUSCULAR | Status: AC
  Filled 2020-12-16: qty 2

## 2020-12-16 MED ORDER — CEFAZOLIN SODIUM-DEXTROSE 1-4 GM/50ML-% IV SOLN
1.0000 g | Freq: Four times a day (QID) | INTRAVENOUS | Status: AC
  Administered 2020-12-16 – 2020-12-17 (×3): 1 g via INTRAVENOUS
  Filled 2020-12-16 (×4): qty 50

## 2020-12-16 MED ORDER — AMLODIPINE BESYLATE 5 MG PO TABS
5.0000 mg | ORAL_TABLET | Freq: Every day | ORAL | Status: DC
  Administered 2020-12-16 – 2020-12-17 (×2): 5 mg via ORAL
  Filled 2020-12-16 (×2): qty 1

## 2020-12-16 MED ORDER — SODIUM CHLORIDE 0.9 % IV SOLN
INTRAVENOUS | Status: AC
  Filled 2020-12-16: qty 2

## 2020-12-16 MED ORDER — FAMOTIDINE 20 MG PO TABS
20.0000 mg | ORAL_TABLET | Freq: Every day | ORAL | Status: DC
Start: 2020-12-17 — End: 2020-12-17

## 2020-12-16 MED ORDER — CEFAZOLIN SODIUM-DEXTROSE 2-4 GM/100ML-% IV SOLN
INTRAVENOUS | Status: AC
  Filled 2020-12-16: qty 100

## 2020-12-16 MED ORDER — MIDAZOLAM HCL 5 MG/5ML IJ SOLN
INTRAMUSCULAR | Status: AC
  Filled 2020-12-16: qty 5

## 2020-12-16 MED ORDER — LIDOCAINE HCL (PF) 1 % IJ SOLN
INTRAMUSCULAR | Status: AC
  Filled 2020-12-16: qty 60

## 2020-12-16 MED ORDER — METOPROLOL TARTRATE 5 MG/5ML IV SOLN
INTRAVENOUS | Status: AC
  Filled 2020-12-16: qty 5

## 2020-12-16 MED ORDER — HYDRALAZINE HCL 20 MG/ML IJ SOLN
INTRAMUSCULAR | Status: AC
  Filled 2020-12-16: qty 1

## 2020-12-16 MED ORDER — HEPARIN (PORCINE) IN NACL 1000-0.9 UT/500ML-% IV SOLN
INTRAVENOUS | Status: DC | PRN
  Administered 2020-12-16: 500 mL

## 2020-12-16 MED ORDER — POTASSIUM CHLORIDE CRYS ER 20 MEQ PO TBCR
40.0000 meq | EXTENDED_RELEASE_TABLET | Freq: Once | ORAL | Status: AC
  Administered 2020-12-16: 40 meq via ORAL
  Filled 2020-12-16: qty 2

## 2020-12-16 MED ORDER — METOPROLOL TARTRATE 5 MG/5ML IV SOLN
INTRAVENOUS | Status: DC | PRN
  Administered 2020-12-16: 5 mg via INTRAVENOUS

## 2020-12-16 MED ORDER — HYDRALAZINE HCL 20 MG/ML IJ SOLN
INTRAMUSCULAR | Status: DC | PRN
  Administered 2020-12-16: 5 mg via INTRAVENOUS

## 2020-12-16 MED ORDER — HEPARIN (PORCINE) IN NACL 1000-0.9 UT/500ML-% IV SOLN
INTRAVENOUS | Status: AC
  Filled 2020-12-16: qty 500

## 2020-12-16 MED ORDER — FENTANYL CITRATE (PF) 100 MCG/2ML IJ SOLN
INTRAMUSCULAR | Status: DC | PRN
  Administered 2020-12-16: 12.5 ug via INTRAVENOUS

## 2020-12-16 MED ORDER — ACETAMINOPHEN 325 MG PO TABS: 325.0000 mg | ORAL_TABLET | ORAL | Status: DC | PRN

## 2020-12-16 MED ORDER — MIDAZOLAM HCL 5 MG/5ML IJ SOLN
INTRAMUSCULAR | Status: DC | PRN
  Administered 2020-12-16: 1 mg via INTRAVENOUS

## 2020-12-16 MED ORDER — LOSARTAN POTASSIUM 50 MG PO TABS
50.0000 mg | ORAL_TABLET | Freq: Every day | ORAL | Status: DC
  Administered 2020-12-16 – 2020-12-17 (×2): 50 mg via ORAL
  Filled 2020-12-16 (×2): qty 1

## 2020-12-16 MED ORDER — LIDOCAINE HCL (PF) 1 % IJ SOLN
INTRAMUSCULAR | Status: DC | PRN
  Administered 2020-12-16: 60 mL

## 2020-12-16 SURGICAL SUPPLY — 12 items
CABLE SURGICAL S-101-97-12 (CABLE) ×2 IMPLANT
CATH RIGHTSITE C315HIS02 (CATHETERS) ×2 IMPLANT
IPG PACE AZUR XT SR MRI W1SR01 (Pacemaker) ×1 IMPLANT
LEAD SELECT SECURE 3830 383069 (Lead) ×1 IMPLANT
MAT PREVALON FULL STRYKER (MISCELLANEOUS) ×1 IMPLANT
PACE AZURE XT SR MRI W1SR01 (Pacemaker) ×2 IMPLANT
PAD PRO RADIOLUCENT 2001M-C (PAD) ×2 IMPLANT
SELECT SECURE 3830 383069 (Lead) ×2 IMPLANT
SHEATH 7FR PRELUDE SNAP 13 (SHEATH) ×2 IMPLANT
SLITTER 6232ADJ (MISCELLANEOUS) ×1 IMPLANT
TRAY PACEMAKER INSERTION (PACKS) ×2 IMPLANT
WIRE HITORQ VERSACORE ST 145CM (WIRE) ×1 IMPLANT

## 2020-12-16 NOTE — Discharge Instructions (Signed)
DO NOT RESUME XARELTO UNTIL 12/20/20 EVENING      Supplemental Discharge Instructions for  Pacemaker/Defibrillator Patients   Activity No heavy lifting or vigorous activity with your left/right arm for 6 to 8 weeks.  Do not raise your left/right arm above your head for one week.  Gradually raise your affected arm as drawn below.             12/21/20                    12/22/20                    12/23/20                   12/24/20 __  NO DRIVING for 1 week  ; you may begin driving on  0/99/83   .  WOUND CARE - Keep the wound area clean and dry.  Do not get this area wet , no showers until cleared to at your wound check visit - The tape/steri-strips on your wound will fall off; do not pull them off.  No bandage is needed on the site.  DO  NOT apply any creams, oils, or ointments to the wound area. - If you notice any drainage or discharge from the wound, any swelling or bruising at the site, or you develop a fever > 101? F after you are discharged home, call the office at once.  Special Instructions - You are still able to use cellular telephones; use the ear opposite the side where you have your pacemaker/defibrillator.  Avoid carrying your cellular phone near your device. - When traveling through airports, show security personnel your identification card to avoid being screened in the metal detectors.  Ask the security personnel to use the hand wand. - Avoid arc welding equipment, MRI testing (magnetic resonance imaging), TENS units (transcutaneous nerve stimulators).  Call the office for questions about other devices. - Avoid electrical appliances that are in poor condition or are not properly grounded. - Microwave ovens are safe to be near or to operate.

## 2020-12-16 NOTE — Consult Note (Addendum)
Cardiology Consultation:   Patient ID: Alyssa Lindsey MRN: [id number]; DOB: [birth year]  Admit date: 12/15/2020 Date of Consult: 12/16/2020  Primary Care Provider: Myrlene Broker, MD [facility] Cardiologist: Alyssa Rotunda, MD  [facility] Electrophysiologist:  None    Patient Profile:   Alyssa Lindsey is a 87 y.o. female with a hx of HTN, DM, chronic back pain, AFib, bradycardia,   who is being seen today for the evaluation of CHB at the request of Dr. Shari Lindsey.  History of Present Illness:   [name] saw Dr. Antoine Lindsey last 11/08/20, he was in Afib rate 48 and his dilt was stopped. There was c/o some degree of SOB that was not new and not felt cardiac in etiology, referred back to PMD for possible pulm referral and trial of albuterol rx.  Planned for 3 day monitoring for HR.  Prior w/u included CT chest was negative for acute process. TTE demonstrated normal EF, severe LVH of basal-septal segment, no significant valvular abnormalities. She was following up with Pulm today where she was noted to have restrictive PFTs and wheezing in upper airway.   Yesterday she was at Alyssa Lindsey office for evaluation of her SOB, her HR was in  The 30's and EMS called and sent to the hospital.  LABS K+ 3.8 > 3.4 BUN/Creat 18/1.42 > 1.28 HS Trop 25 > 28 WBC 6.4 H/H 13/40 Plts 136  COVID neg  She denies any CP, has had intermittent dizzy spells, no fainting  Past Medical History:  Diagnosis Date   Arthritis    Atrial fibrillation (HCC)    Chronic neck pain    Diverticulosis of colon (without mention of hemorrhage)    Family history of malignant neoplasm of gastrointestinal tract    Gastritis    GERD (gastroesophageal reflux disease)    Hiatal hernia    HTN (hypertension)    LBP (low back pain)    Memory problem    Spondylosis    Thrombocytopenia (HCC)    Type II or unspecified type diabetes mellitus without mention of complication, not stated as  uncontrolled     Past Surgical History:  Procedure Laterality Date   COLONOSCOPY  2010   NORMAL-- DUE 2020   TUBAL LIGATION       Home Medications:  Prior to Admission medications   Medication Sig Start Date End Date Taking? Authorizing Provider  albuterol (PROVENTIL) (2.5 MG/3ML) 0.083% nebulizer solution Take 3 mLs (2.5 mg total) by nebulization every 6 (six) hours as needed for wheezing or shortness of breath. 12/09/20   Alyssa Broker, MD  albuterol (VENTOLIN HFA) 108 (90 Base) MCG/ACT inhaler Inhale 2 puffs into the lungs every 8 (eight) hours. 11/08/20   Alyssa Rotunda, MD  amLODipine (NORVASC) 5 MG tablet Take 1 tablet (5 mg total) by mouth daily. 11/08/20 02/06/21  Alyssa Rotunda, MD  Capsaicin 0.075 % STCK .075% topically twice per day 11/22/20   Alyssa Fennel, MD  cetirizine (ZYRTEC) 10 MG tablet TAKE ONE TABLET BY MOUTH ONCE DAILY 10/25/16   Alyssa Broker, MD  famotidine (PEPCID) 20 MG tablet Take 1 tablet (20 mg total) by mouth 2 (two) times daily. 09/18/19   Alyssa Broker, MD  furosemide (LASIX) 20 MG tablet Take 1 tablet (20 mg total) by mouth as needed for edema. 02/25/20 09/27/21  Alyssa Course, PA  lidocaine (LIDODERM) 5 % Place 2 patches onto the skin daily. Remove & Discard patch within 12 hours or as directed by  MD 09/06/20   Alyssa Fennel, MD  losartan (COZAAR) 50 MG tablet Take 50 mg by mouth daily. 09/29/20   [provider]  mirtazapine (REMERON) 30 MG tablet Take 1 tablet (30 mg total) by mouth at bedtime. 06/27/20   Alyssa Broker, MD  NONFORMULARY OR COMPOUNDED ITEM Ketamine 10%, Baclofen 2%, Cyclobenzaprine 2%, Ketoprofen 10%, Gabapentin 6%, Lidocaine 1%, Amitryptiline 5% 10/25/20   Alyssa Lindsey, Alyssa Gottron, MD  pregabalin (LYRICA) 75 MG capsule Take 1 capsule (75 mg total) by mouth 2 (two) times daily. 11/22/20   Alyssa Fennel, MD  Rivaroxaban (XARELTO) 15 MG TABS tablet Take 1 tablet (15 mg total) by mouth daily with supper.  12/30/19   Jodelle Gross, NP  umeclidinium-vilanterol (ANORO ELLIPTA) 62.5-25 MCG/INH AEPB Inhale 1 puff into the lungs daily. 11/10/20   Alyssa Broker, MD    Inpatient Medications: Scheduled Meds:  famotidine  20 mg Oral BID   gentamicin irrigation  80 mg Irrigation To Cath   loratadine  10 mg Oral Daily   mirtazapine  30 mg Oral QHS   potassium chloride  40 mEq Oral Once   pregabalin  75 mg Oral BID   umeclidinium-vilanterol  1 puff Inhalation Daily   Continuous Infusions:  sodium chloride     sodium chloride      ceFAZolin (ANCEF) IV     PRN Meds: acetaminophen, albuterol, ondansetron (ZOFRAN) IV  Allergies:    Allergies  Allergen Reactions   Dicyclomine Hcl Other (See Comments)    Severe eye pain   Lisinopril Cough   Aspirin Other (See Comments)    Unknown reaction    Social History:   Social History   Socioeconomic History   Marital status: Widowed    Spouse name: Not on file   Number of children: 4   Years of education: Not on file   Highest education level: Not on file  Occupational History   Occupation: Retired housekeeper  Tobacco Use   Smoking status: Never Smoker   Smokeless tobacco: Never Used  Substance and Sexual Activity   Alcohol use: No    Alcohol/week: 0.0 standard drinks   Drug use: No   Sexual activity: Not on file  Other Topics Concern   Not on file  Social History Narrative   Widow    4 children: 1 son '59; 3 dtrs '53, '62, '64; 9 grandchildren; great-grand   Lives with dtr at home, grandson at home      Daily Caffeine Use:  1 cup   Regular Exercise -  NO         Social Determinants of Health   Financial Resource Strain: Not on file  Food Insecurity: Not on file  Transportation Needs: Not on file  Physical Activity: Not on file  Stress: Not on file  Social Connections: Not on file  Intimate Partner Violence: Not on file    Family History:   Family History  Problem Relation Age of Onset    Emphysema Brother    Stroke Father 52   Ovarian cancer Sister    Breast cancer Sister    Liver disease Brother    Colon cancer Daughter    Diabetes Daughter    Throat cancer Brother    Lung cancer Daughter      ROS:  Please see the history of present illness.  All other ROS reviewed and negative.     Physical Exam/Data:   Vitals:   12/15/20 2346 12/16/20 0149 12/16/20  0655 12/16/20 0832  BP: (!) 193/61 (!) 176/93 (!) 186/81   Pulse: (!) 36 (!) 31 (!) 41   Resp: 20 20 19    Temp: 97.8 F (36.6 C) 98.1 F (36.7 C) 98.6 F (37 C)   TempSrc: Oral Oral Oral   SpO2: 95% 93% 97% 98%  Weight:      Height:        Intake/Output Summary (Last 24 hours) at 12/16/2020 0839 Last data filed at 12/16/2020 0650 Gross per 24 hour  Intake --  Output 1250 ml  Net -1250 ml   Last 3 Weights 12/15/2020 12/15/2020 12/09/2020  Weight (lbs) 187 lb 187 lb 181 lb  Weight (kg) 84.823 kg 84.823 kg 82.101 kg     Body mass index is 36.52 kg/m.  General:  Well nourished, well developed, in no acute distress HEENT: normal Lymph: no adenopathy Neck: no JVD Endocrine:  No thryomegaly Vascular: No carotid bruits  Cardiac: RRR; bradycardic, soft SM, no gallops or rubs Lungs:  CTA b/l, no wheezing, rhonchi or rales  Abd: soft, nontender Ext: trace edema Musculoskeletal:  No deformities Skin: warm and dry  Neuro:  no focal abnormalities noted Psych:  Normal affect   EKG:  The EKG was personally reviewed and demonstrates:    AFib 40bpm, RBBB (new), is regular and likely is CHB  Telemetry:  Telemetry was personally reviewed and demonstrates:   AFib 30's-40's  Relevant CV Studies:  Jan 2022, 3 day monitor Atrial fib continuous Rate seemed to be well controlled. Rare ventricular ectopy Ventricular bigeminy Ventricular triplets noted.    04/18/20: TTE IMPRESSIONS  1. Left ventricular ejection fraction, by estimation, is 60 to 65%. The  left ventricle has normal function. The  left ventricle has no regional  wall motion abnormalities. There is severe left ventricular hypertrophy of  the basal-septal segment. Left  ventricular diastolic parameters are indeterminate.  2. Right ventricular systolic function is normal. The right ventricular  size is normal. There is mildly elevated pulmonary artery systolic  pressure.  3. The mitral valve is normal in structure. Mild mitral valve  regurgitation. No evidence of mitral stenosis.  4. The aortic valve is normal in structure. Aortic valve regurgitation is  not visualized. No aortic stenosis is present.  5. The inferior vena cava is normal in size with greater than 50%  respiratory variability, suggesting right atrial pressure of 3 mmHg.    Laboratory Data:  High Sensitivity Troponin:   Recent Labs  Lab 12/15/20 1729 12/15/20 2112  TROPONINIHS 25* 28*     Chemistry Recent Labs  Lab 12/15/20 1729 12/16/20 0217  NA 144 142  K 3.8 3.4*  CL 110 108  CO2 26 23  GLUCOSE 121* 124*  BUN 20 18  CREATININE 1.42* 1.28*  CALCIUM 9.0 8.6*  GFRNONAA 35* 40*  ANIONGAP 8 11    Recent Labs  Lab 12/15/20 1729  PROT 6.3*  ALBUMIN 3.6  AST 31  ALT 60*  ALKPHOS 77  BILITOT 1.2   Hematology Recent Labs  Lab 12/15/20 1729  WBC 6.4  RBC 4.70  HGB 13.0  HCT 40.2  MCV 85.5  MCH 27.7  MCHC 32.3  RDW 19.2*  PLT 136*   BNP Recent Labs  Lab 12/15/20 1729  BNP 699.0*    DDimer No results for input(s): DDIMER in the last 168 hours.   Radiology/Studies:  DG Chest Portable 1 View  Result Date: 12/15/2020 CLINICAL DATA:  Short of breath EXAM: PORTABLE CHEST 1  VIEW COMPARISON:  09/03/2020 FINDINGS: Mild cardiomegaly with central congestion. Suspected small left pleural effusion. Airspace disease at the left base. No pneumothorax. Aortic atherosclerosis. IMPRESSION: 1. Mild cardiomegaly with central congestion. 2. Probable small left pleural effusion with left basilar atelectasis or pneumonia.  Electronically Signed   By: Jasmine Pang M.D.   On: 12/15/2020 17:55     Assessment and Plan:   1. CHB 2. New RBBB of late, though older EKGs have noted RBBB     Rates were in the 30's at pulm office yesterday     No reversible causes     Recommend PPM, I anticipate a single chamber device  3. I think this is permanent AFib     I have reviewed her EKGs, the last time I see SR was Jun 2019     CHA2DS2Vasc is 5, on Xarelto outpt, appropriately dosed for Cal CrCl of 40 today      4. HTN     Resume her meds post pacing  5. DOE     Ongoing for many months, unchanged, no rest SOB  6. Edema     Trace, comes/goes, she has PRN lasix at home  7. Mild hypokalemia     Replacement ordered   Dr. Ladona Ridgel has seen and examined the patient this AM, planned for PPM this morning       Risk Assessment/Risk Scores:  {  For questions or updates, please contact [facility] Please consult [email or web address] for contact info under    Signed, Sheilah Pigeon, PA-C  12/16/2020 8:39 AM   EP attending  Patient seen and examined. Agree with findings as documented above. The patient presents with complete heart block in the setting of long-term persistent atrial fibrillation, for over 2 years. She has been symptomatic. She is on no reversible medical therapy. I discussed treatment options in detail with the patient and her daughter. The risk, goals, benefits, and expectations of permanent pacemaker insertion were reviewed and she wishes to proceed.  Lewayne Bunting, MD

## 2020-12-16 NOTE — Plan of Care (Signed)
°  Problem: Education: Goal: Knowledge of General Education information will improve Description: Including pain rating scale, medication(s)/side effects and non-pharmacologic comfort measures Outcome: Progressing   Problem: Education: Goal: Knowledge of cardiac device and self-care will improve Outcome: Progressing Goal: Ability to safely manage health related needs after discharge will improve Outcome: Progressing Goal: Individualized Educational Video(s) Outcome: Progressing   Problem: Cardiac: Goal: Ability to achieve and maintain adequate cardiopulmonary perfusion will improve Outcome: Progressing

## 2020-12-16 NOTE — Progress Notes (Signed)
   12/15/20 2346  Vitals  Temp 97.8 F (36.6 C)  Temp Source Oral  BP (!) 193/61  MAP (mmHg) 96  BP Location Right Arm  BP Method Automatic  Patient Position (if appropriate) Lying  Pulse Rate (!) 36  Pulse Rate Source Monitor  ECG Heart Rate (!) 36  Resp 20  Level of Consciousness  Level of Consciousness Alert  Oxygen Therapy  SpO2 95 %  O2 Device Room Air  Patient Activity (if Appropriate) In bed  Pulse Oximetry Type Continuous  Pain Assessment  Pain Scale 0-10  Pain Score 0  MEWS Score  MEWS Temp 0  MEWS Systolic 0  MEWS Pulse 2  MEWS RR 0  MEWS LOC 0  MEWS Score 2  MEWS Score Color Yellow

## 2020-12-17 ENCOUNTER — Inpatient Hospital Stay (HOSPITAL_COMMUNITY): Payer: Medicare HMO

## 2020-12-17 DIAGNOSIS — I442 Atrioventricular block, complete: Secondary | ICD-10-CM | POA: Diagnosis not present

## 2020-12-17 DIAGNOSIS — I517 Cardiomegaly: Secondary | ICD-10-CM | POA: Diagnosis not present

## 2020-12-17 DIAGNOSIS — Z950 Presence of cardiac pacemaker: Secondary | ICD-10-CM | POA: Diagnosis not present

## 2020-12-17 DIAGNOSIS — R531 Weakness: Secondary | ICD-10-CM | POA: Diagnosis not present

## 2020-12-17 DIAGNOSIS — K219 Gastro-esophageal reflux disease without esophagitis: Secondary | ICD-10-CM | POA: Diagnosis not present

## 2020-12-17 DIAGNOSIS — Z7901 Long term (current) use of anticoagulants: Secondary | ICD-10-CM | POA: Diagnosis not present

## 2020-12-17 DIAGNOSIS — K5730 Diverticulosis of large intestine without perforation or abscess without bleeding: Secondary | ICD-10-CM | POA: Diagnosis not present

## 2020-12-17 DIAGNOSIS — Z7951 Long term (current) use of inhaled steroids: Secondary | ICD-10-CM | POA: Diagnosis not present

## 2020-12-17 DIAGNOSIS — I4821 Permanent atrial fibrillation: Secondary | ICD-10-CM | POA: Diagnosis not present

## 2020-12-17 DIAGNOSIS — Z20822 Contact with and (suspected) exposure to covid-19: Secondary | ICD-10-CM | POA: Diagnosis not present

## 2020-12-17 DIAGNOSIS — E119 Type 2 diabetes mellitus without complications: Secondary | ICD-10-CM | POA: Diagnosis not present

## 2020-12-17 DIAGNOSIS — R001 Bradycardia, unspecified: Secondary | ICD-10-CM | POA: Diagnosis not present

## 2020-12-17 DIAGNOSIS — E876 Hypokalemia: Secondary | ICD-10-CM | POA: Diagnosis not present

## 2020-12-17 DIAGNOSIS — I10 Essential (primary) hypertension: Secondary | ICD-10-CM | POA: Diagnosis not present

## 2020-12-17 MED ORDER — PANTOPRAZOLE SODIUM 40 MG PO TBEC
40.0000 mg | DELAYED_RELEASE_TABLET | Freq: Every day | ORAL | Status: DC
  Administered 2020-12-17: 40 mg via ORAL
  Filled 2020-12-17: qty 1

## 2020-12-17 MED ORDER — FAMOTIDINE 20 MG PO TABS: 20.0000 mg | ORAL_TABLET | Freq: Every day | ORAL | Status: DC

## 2020-12-17 NOTE — Progress Notes (Signed)
Courtesy follow up note:  I saw pt in office 01/15/21 with evidence of upper airway wheezing only and pfts suggesting no air flow obstruction at baseline but worsened after use of albuterol.  This is typical of  Upper airway cough syndrome (previously labeled PNDS),  is so named because it's frequently impossible to sort out how much is  CR/sinusitis with freq throat clearing (which can be related to primary GERD)   vs  causing  secondary (" extra esophageal")  GERD from wide swings in gastric pressure that occur with throat clearing.  These are the same pts (now being labeled as having "irritable larynx syndrome" by some cough centers) who not infrequently have a history of having failed to tolerate ace inhibitors,  dry powder inhalers(like anoro) or biphosphonates or report having atypical/extraesophageal reflux symptoms that don't respond to standard doses of PPI  and are easily confused as having aecopd or asthma flares by even experienced allergists/ pulmonologists (myself included).   rec  D/c anoro  Consider more aggressive rx for gerd with Pantoprazole (protonix) 40 mg   Take  30-60 min before first meal of the day and Pepcid (famotidine)  20 mg one after supper  until return to office - this is the best way to tell whether stomach acid is contributing to your problem.    Pulmonary f/u prn in office if this plus correcting her bradycardia don't correct the problem     Sandrea Hughs, MD Pulmonary and Critical Care Medicine [facility] Cell [phone]   After 7:00 pm call Elink  [phone]

## 2020-12-17 NOTE — Care Management (Cosign Needed)
    Durable Medical Equipment  (From admission, onward)         Start     Ordered   12/17/20 1434  For home use only DME Walker rolling  Once       Question Answer Comment  Walker: With 5 Inch Wheels   Patient needs a walker to treat with the following condition Generalized weakness   Patient needs a walker to treat with the following condition Impaired functional mobility, balance, and endurance      12/17/20 1435   12/17/20 1434  For home use only DME 3 n 1  Once        12/17/20 1435   12/17/20 1434  For home use only DME Hospital bed  Once       Question Answer Comment  Length of Need 6 Months   Patient has (list medical condition): symptomic bradycardia, permanent atrial fibrillation, generalized weakness/deconditioning   Bed type Semi-electric      12/17/20 1435         Quintella Baton, RN, BSN  Trauma/Neuro ICU Case Manager [phone]

## 2020-12-17 NOTE — Discharge Summary (Signed)
Discharge Summary    Patient ID: Alyssa Lindsey MRN: [id number]; DOB: 1929-01-01  Admit date: 12/15/2020 Discharge date: 12/17/2020  Primary Care Provider: Myrlene Broker, MD  Primary Cardiologist: Rollene Rotunda, MD  EP: Ladona Ridgel   Discharge Diagnoses    Principal Problem:   Symptomatic bradycardia Active Problems:   Diabetes type 2, controlled (HCC)   Essential hypertension   GERD (gastroesophageal reflux disease)   Atrial fibrillation with slow ventricular rate    Diagnostic Studies/Procedures    PACEMAKER IMPLANT    Conclusion  CONCLUSIONS:   1. Successful implantation of a medtronic single-chamber pacemaker for symptomatic bradycardia due to CHB  2. No early apparent complications.      PREPROCEDURE DIAGNOSIS:  Symptomatic Bradycardia due to complete heart block    POSTPROCEDURE DIAGNOSIS:  Same as preprocedure diagnosis     PROCEDURES:   1. Pacemaker implantation.     INTRODUCTION: Alyssa Lindsey is a 87 y.o. female  with a history of bradycardia who presents today for pacemaker implantation.  The patient reports intermittent episodes of dizziness over the past few months.  No reversible causes have been identified.  The patient therefore presents today for pacemaker implantation.     DESCRIPTION OF PROCEDURE:  Informed written consent was obtained, and the patient was brought to the electrophysiology lab in a fasting state.  The patient was sedated with IV versed and fentanyl for the procedure today.  The patients left chest was prepped and draped in the usual sterile fashion by the EP lab staff. The skin overlying the left deltopectoral region was infiltrated with lidocaine for local analgesia.  A 4-cm incision was made over the left deltopectoral region.  A left subcutaneous pacemaker pocket was fashioned using a combination of sharp and blunt dissection. Electrocautery was required to assure hemostasis.    RA/RV Lead Placement: The left axillary vein  was punctured.  Through the left axillary vein, a Medtronic (serial number [id number]) right ventricular lead was advanced with fluoroscopic visualization into the right ventricular septal position.  Right ventricular lead R-waves measured 6 mV with an impedance of 1000 ohms and a threshold of 1 V at 0.5 msec.  Both leads were secured to the pectoralis fascia using #2-0 silk over the suture sleeve.   Device Placement:  The leads were then connected to a Medtronic (serial number [id number]) pacemaker.  The pocket was irrigated with copious gentamicin solution.  The pacemaker was then placed into the pocket.  The pocket was then closed in 2 layers with 2.0 Vicryl suture for the subcutaneous and subcuticular layers.  Steri-Strips and a sterile dressing were then applied.  There were no early apparent complications.      Estimated blood loss <50 mL.   During this procedure medications were administered to achieve and maintain moderate conscious sedation while the patient's heart rate, blood pressure, and oxygen saturation were continuously monitored and I was present face-to-face 100% of this time.    History of Present Illness     Alyssa Lindsey is a 87 y.o. female with PMH of permenent afib, GERD, HTN, DM2 who presented to the ED from pulmonologist office with concern for symptomatic bradycardia.   She has a history suspected permanent Afib on xarelto and was last seen by Dr. Antoine Poche in clinic on 11/08/20. During that visit, she was noted to have HR 40s in slow Afib. Her diltiazem was discontinued at that time and she has been off nodal blocking agents. She also had undergone a  thorough work-up for SOB. CT chest was negative for acute process. TTE demonstrated normal EF, severe LVH of basal-septal segment, no significant valvular abnormalities. She was following up with Pulmonologist where she was noted to have restrictive PFTs and wheezing in upper airway. Given HR in 30s, however, she was sent to  ED for further evaluation.   Stated that other than her shortness of breath, she has no lightheadedness, fatigue, decreased exercise tolerance, chest pressure or orthopnea. No palpitations. She is not currently taking any nodal blocking agents.   Hospital Course     Consultants: EP  #Symptomatic bradycardia #Afib with slow VR She was admitted with pacer at bedside. Kept K>4, Mg>2. The patient was seen by EP and felt that patient has complete heart block in the setting of long-term persistent atrial fibrillation, for over 2 years. She has been symptomatic. Also with RBBB.  She is on no reversible medical therapy. She underwent  Successful implantation of a medtronic single-chamber pacemaker (serial number [id number]) for symptomatic bradycardia due to CHB. Tolerated procedure well. No overnight complications. CXR without pneumothorax. Normal functioning device. Resume Xarelto 12/20/20 evening.   #SOB: Likely multifactorial in the setting of restrictive lung disease on PFTs and underlying bradycardia as detailed above. TTE with normal EF and no significant valvular abnormalities. CT chest with no acute findgins.  #HTN: Elevated currently.  -Will tolerate higher blood pressures given significant bradycardia -Add back home meds as tolerated post-PPM placement  Did the patient have an acute coronary syndrome (MI, NSTEMI, STEMI, etc) this admission?:  No                               Did the patient have a percutaneous coronary intervention (stent / angioplasty)?:  No.     Discharge Vitals Blood pressure (!) 150/73, pulse 60, temperature (!) 94.8 F (34.9 C), temperature source Oral, resp. rate (!) 22, height 5' (1.524 m), weight 84.8 kg, SpO2 97 %.  Filed Weights   12/15/20 1713  Weight: 84.8 kg   Physical Exam Constitutional:      Comments: Alert but somewhat drowsy female in no acute distress   HENT:     Head: Normocephalic.     Nose: Nose normal.  Eyes:     Pupils: Pupils are  equal, round, and reactive to light.  Cardiovascular:     Rate and Rhythm: Normal rate and regular rhythm.     Pulses: Normal pulses.     Comments: L upper chest with dressing. No drainage.  Pulmonary:     Effort: Pulmonary effort is normal.     Breath sounds: Normal breath sounds.  Abdominal:     General: Abdomen is flat.     Palpations: Abdomen is soft.  Musculoskeletal:        General: Normal range of motion.     Cervical back: Normal range of motion.  Skin:    General: Skin is warm and dry.  Neurological:     General: No focal deficit present.     Mental Status: Mental status is at baseline.  Psychiatric:        Behavior: Behavior normal.    Labs & Radiologic Studies    CBC Recent Labs    12/15/20 1729  WBC 6.4  NEUTROABS 4.1  HGB 13.0  HCT 40.2  MCV 85.5  PLT 136*   Basic Metabolic Panel Recent Labs    50/27/74 1729 12/16/20 0217  NA 144  142  K 3.8 3.4*  CL 110 108  CO2 26 23  GLUCOSE 121* 124*  BUN 20 18  CREATININE 1.42* 1.28*  CALCIUM 9.0 8.6*   Liver Function Tests Recent Labs    12/15/20 1729  AST 31  ALT 60*  ALKPHOS 77  BILITOT 1.2  PROT 6.3*  ALBUMIN 3.6   High Sensitivity Troponin:   Recent Labs  Lab 12/15/20 1729 12/15/20 2112  TROPONINIHS 25* 28*   _____________  DG Chest 2 View  Result Date: 12/17/2020 CLINICAL DATA:  Pacemaker placement EXAM: CHEST - 2 VIEW COMPARISON:  Two days ago FINDINGS: Single chamber pacer lead into the right ventricle. No new mediastinal widening when accounting for rotation. Chronic cardiomegaly. No convincing pneumothorax. Negative for failure. Motion degraded lateral view. IMPRESSION: No acute finding after pacer implant. Electronically Signed   By: Marnee Spring M.D.   On: 12/17/2020 05:47   EP PPM/ICD IMPLANT  Result Date: 12/16/2020 CONCLUSIONS:  1. Successful implantation of a medtronic single-chamber pacemaker for symptomatic bradycardia due to CHB  2. No early apparent complications.        Lewayne Bunting, MD 12/16/2020 1:10 PM  DG Chest Portable 1 View  Result Date: 12/15/2020 CLINICAL DATA:  Short of breath EXAM: PORTABLE CHEST 1 VIEW COMPARISON:  09/03/2020 FINDINGS: Mild cardiomegaly with central congestion. Suspected small left pleural effusion. Airspace disease at the left base. No pneumothorax. Aortic atherosclerosis. IMPRESSION: 1. Mild cardiomegaly with central congestion. 2. Probable small left pleural effusion with left basilar atelectasis or pneumonia. Electronically Signed   By: Jasmine Pang M.D.   On: 12/15/2020 17:55   LONG TERM MONITOR (3-14 DAYS)  Result Date: 12/01/2020 Atrial fib continuous Rate seemed to be well controlled. Rare ventricular ectopy Ventricular bigeminy Ventricular triplets noted.   Disposition   Pt is being discharged home today in good condition.  Follow-up Plans & Appointments     Follow-up Information    [facility] Office Follow up.   Specialty: Cardiology Why: 12/29/20 @ 10:40AM, wound check visit Contact information: [address], [address] [place] [address] [phone]       Marinus Maw, MD Follow up.   Specialty: Cardiology Why: 03/29/21 @ 1:45PM Contact information: [address] [place] [address] [phone]        Abelino Derrick, [place]. Go on 12/21/2020.   Specialties: Cardiology, Radiology Why: @3 :45 for follow up  Contact information: [address] [place] [phone]              Discharge Instructions    Diet - low sodium heart healthy   Complete by: As directed    Discharge wound care:   Complete by: As directed    See instruction   Increase activity slowly   Complete by: As directed       Discharge Medications   Allergies as of 12/17/2020      Reactions   Dicyclomine Hcl Other (See Comments)   Severe eye pain   Lisinopril Cough   Aspirin Other (See Comments)   Unknown reaction      Medication List     TAKE these medications   acetaminophen 650 MG CR tablet Commonly known as: TYLENOL Take 1,300 mg by mouth every 8 (eight) hours as needed for pain.   albuterol 108 (90 Base) MCG/ACT inhaler Commonly known as: VENTOLIN HFA Inhale 2 puffs into the lungs every 8 (eight) hours. What changed:  when to take this  reasons to take this   albuterol (2.5 MG/3ML) 0.083% nebulizer solution Commonly known as: PROVENTIL Take 3 mLs (2.5 mg total) by nebulization every 6 (six) hours as needed for wheezing or shortness of breath. What changed: Another medication with the same name was changed. Make sure you understand how and when to take each.   amLODipine 5 MG tablet Commonly known as: NORVASC Take 1 tablet (5 mg total) by mouth daily.   Anoro Ellipta 62.5-25 MCG/INH Aepb Generic drug: umeclidinium-vilanterol Inhale 1 puff into the lungs daily.   Capsaicin 0.075 % Stck .075% topically twice per day   cetirizine 10 MG tablet Commonly known as: ZYRTEC TAKE ONE TABLET BY MOUTH ONCE DAILY   famotidine 20 MG tablet Commonly known as: PEPCID Take 1 tablet (20 mg total) by mouth 2 (two) times daily. What changed:   when to take this  reasons to take this   furosemide 20 MG tablet Commonly known as: LASIX Take 1 tablet (20 mg total) by mouth as needed for edema. What changed: when to take this   hydrochlorothiazide 12.5 MG capsule Commonly known as: MICROZIDE Take 12.5 mg by mouth daily.   lidocaine 5 % Commonly known as: Lidoderm Place 2 patches onto the skin daily. Remove & Discard patch within 12 hours or as directed by MD What changed:   how much to take  when to take this  reasons to take this   losartan 50 MG tablet Commonly known as: COZAAR Take 50 mg by mouth daily.   mirtazapine 30 MG tablet Commonly known as: REMERON Take 1 tablet (30 mg total) by mouth at bedtime.   NONFORMULARY OR COMPOUNDED ITEM Ketamine 10%, Baclofen 2%, Cyclobenzaprine 2%,  Ketoprofen 10%, Gabapentin 6%, Lidocaine 1%, Amitryptiline 5% What changed:   how much to take  how to take this  when to take this  reasons to take this  additional instructions   pregabalin 75 MG capsule Commonly known as: LYRICA Take 1 capsule (75 mg total) by mouth 2 (two) times daily. What changed:   how much to take  when to take this   Rivaroxaban 15 MG Tabs tablet Commonly known as: Xarelto Take 1 tablet (15 mg total) by mouth daily with supper. Notes to patient: Do not resume until 12/20/20 evening            Discharge Care Instructions  (From admission, onward)         Start     Ordered   12/17/20 0000  Discharge wound care:       Comments: See instruction   12/17/20 1155             Outstanding Labs/Studies   None  Duration of Discharge Encounter   Greater than 30 minutes including physician time.  Lorelei PontSigned, Roseana Rhine, PA 12/17/2020, 11:55 AM

## 2020-12-17 NOTE — Progress Notes (Signed)
Doing well s/p PPM implant Instructions for wound care give to daughter at bedside.  Device interrogation reviewed and normal CXR reveals stable lead, no ptx  Will plan discharge to home today with routine follow-up.  Hillis Range MD, [facility] 12/17/2020 11:29 AM

## 2020-12-17 NOTE — Progress Notes (Signed)
Increased confusion overnight; resistant to diagnostics & treatments. Per NT became combative when EKG was attempted. Will attempt again a little later this AM.

## 2020-12-17 NOTE — Evaluation (Signed)
Physical Therapy Evaluation Patient Details Name: Alyssa Lindsey MRN: [id number] DOB: 06-28-1929 Today's Date: 12/17/2020   History of Present Illness  Alyssa Lindsey is a 87 y.o. female with PMH of chronic afib, GERD, HTN, DM2 who presented to the ED from pulmonologist office with concern for symptomatic bradycardia. Patient with PPM placement on 1/21.  Clinical Impression  PTA, patient lives with daughters and recently began receiving assistance and supervision for mobility. Patient modI for bed mobility and supervision for sit to stand transfer with RW. Patient ambulated 78' with RW and min guard, daughter states this distance is her baseline. Patient presents with impaired cognition, generalized weakness, decreased activity tolerance, and impaired balance. Patient will benefit from skilled PT services during acute stay to address listed deficits. Recommend HHPT following discharge to maximize functional mobility and safety.      Follow Up Recommendations Home health PT;Supervision for mobility/OOB    Equipment Recommendations  Rolling Luella Gardenhire with 5" wheels;3in1 (PT)    Recommendations for Other Services       Precautions / Restrictions Precautions Precautions: Fall Restrictions Weight Bearing Restrictions: No      Mobility  Bed Mobility Overal bed mobility: Modified Independent                  Transfers Overall transfer level: Needs assistance Equipment used: Rolling Johndavid Geralds (2 wheeled) Transfers: Sit to/from Stand Sit to Stand: Supervision         General transfer comment: supervision for safety  Ambulation/Gait Ambulation/Gait assistance: Supervision Gait Distance (Feet): 25 Feet Assistive device: Rolling Zac Torti (2 wheeled) Gait Pattern/deviations: Step-through pattern;Decreased stride length Gait velocity: decreased   General Gait Details: slow but steady pace, no LOB noted  Stairs            Wheelchair Mobility    Modified Rankin  (Stroke Patients Only)       Balance Overall balance assessment: Needs assistance Sitting-balance support: No upper extremity supported;Feet supported Sitting balance-Leahy Scale: Good     Standing balance support: Bilateral upper extremity supported;During functional activity Standing balance-Leahy Scale: Poor Standing balance comment: reliant on UE support                             Pertinent Vitals/Pain Pain Assessment: No/denies pain    Home Living Family/patient expects to be discharged to:: Private residence Living Arrangements: Children (2 daughters) Available Help at Discharge: Family;Available 24 hours/day Type of Home: House Home Access: Stairs to enter Entrance Stairs-Rails: Right;Left;Can reach both Entrance Stairs-Number of Steps: 2 Home Layout: One level Home Equipment: None      Prior Function Level of Independence: Needs assistance   Gait / Transfers Assistance Needed: reaching for objects and supervision for ambulation  ADL's / Homemaking Assistance Needed: assistance required for bathing and dressing        Hand Dominance        Extremity/Trunk Assessment   Upper Extremity Assessment Upper Extremity Assessment: Generalized weakness    Lower Extremity Assessment Lower Extremity Assessment: Generalized weakness    Cervical / Trunk Assessment Cervical / Trunk Assessment: Kyphotic  Communication   Communication: No difficulties  Cognition Arousal/Alertness: Awake/alert Behavior During Therapy: WFL for tasks assessed/performed Overall Cognitive Status: History of cognitive impairments - at baseline                                 General Comments:  A&O to self and place      General Comments General comments (skin integrity, edema, etc.): Daughter present and other daughter on phone. Educated daughters about importance of mobility when returning home    Exercises     Assessment/Plan    PT Assessment  Patient needs continued PT services  PT Problem List Decreased strength;Decreased activity tolerance;Decreased mobility;Decreased balance       PT Treatment Interventions Gait training;Functional mobility training;Stair training;Therapeutic activities;Therapeutic exercise;Balance training;Patient/family education    PT Goals (Current goals can be found in the Care Plan section)  Acute Rehab PT Goals Patient Stated Goal: did not state PT Goal Formulation: With family Time For Goal Achievement: 12/31/20 Potential to Achieve Goals: Fair    Frequency Min 2X/week   Barriers to discharge        Co-evaluation               AM-PAC PT "6 Clicks" Mobility  Outcome Measure Help needed turning from your back to your side while in a flat bed without using bedrails?: None Help needed moving from lying on your back to sitting on the side of a flat bed without using bedrails?: None Help needed moving to and from a bed to a chair (including a wheelchair)?: A Little Help needed standing up from a chair using your arms (e.g., wheelchair or bedside chair)?: A Little Help needed to walk in hospital room?: A Little Help needed climbing 3-5 steps with a railing? : A Little 6 Click Score: 20    End of Session Equipment Utilized During Treatment: Gait belt Activity Tolerance: Patient tolerated treatment well Patient left: in bed;with call bell/phone within reach;with family/visitor present Nurse Communication: Mobility status PT Visit Diagnosis: Unsteadiness on feet (R26.81);Muscle weakness (generalized) (M62.81)    Time: 9675-9163 PT Time Calculation (min) (ACUTE ONLY): 24 min   Charges:   PT Evaluation $PT Eval Low Complexity: 1 Low PT Treatments $Therapeutic Activity: 8-22 mins        Mily Malecki A. Dan Humphreys PT, DPT Acute Rehabilitation Services Pager [phone] Office [phone]   Elissa Lovett 12/17/2020, 2:07 PM

## 2020-12-17 NOTE — TOC Transition Note (Signed)
Transition of Care [facility]) - CM/SW Discharge Note   Patient Details  Name: Alyssa Lindsey MRN: [id number] Date of Birth: February 11, 1929  Transition of Care [facility]) CM/SW Contact:  Glennon Mac, RN Phone Number: 12/17/2020, 3:20 PM   Clinical Narrative:  [name] is a 87 y.o. female with PMH of chronic afib, GERD, HTN, DM2 who presented to the ED from pulmonologist office with concern for symptomatic bradycardia. Patient with PPM placement on 1/21.   Prior to admission, patient required assistance with ADLs; lives at home with daughters.  PT recommending home health follow-up and DME.  Spoke with patient's daughter, [name]: She is agreeable to home health services and recommended DME.  She also request a hospital bed for patient.  Referral to [employer] for DME needs; hospital bed to be delivered to patient's home on Monday, 12/19/2020.  Rolling walker and 3 and 1 to be delivered to bedside prior to discharge.  Home health physical therapy to be provided by [facility]; start date mid-week, per [facility] rep, and daughter aware.           Final next level of care: Home w Home Health Services Barriers to Discharge: Barriers Resolved   Patient Goals and CMS Choice   CMS Medicare.gov Compare Post Acute Care list provided to:: Patient Represenative (must comment) (daughter [name]) Choice offered to / list presented to : Adult Children                      Discharge Plan and Services   Discharge Planning Services: CM Consult Post Acute Care Choice: Durable Medical Equipment,Home Health          DME Arranged: 3-N-1,Hospital bed,Walker rolling DME Agency: [employer] Date DME Agency Contacted: 12/17/20 Time DME Agency Contacted: 1445 Representative spoke with at DME Agency: Mayra Reel HH Arranged: PT HH Agency: [facility] (now [facility]) Date HH Agency Contacted: 12/17/20 Time HH Agency Contacted: 1459 Representative spoke with at [facility] Agency: [place]  Social  Determinants of Health (SDOH) Interventions     Readmission Risk Interventions No flowsheet data found.  Quintella Baton, RN, BSN  Trauma/Neuro ICU Case Manager [phone]

## 2020-12-17 NOTE — Progress Notes (Signed)
Attempted to do am EKG, patient refused.  Patient is confused and very agitated.  Patient's daughter is at the beside.    [name], RN

## 2020-12-17 NOTE — Progress Notes (Signed)
   PT was consulted at the request of family. PT recommended home health PT, rolling walker with 5" wheels, and 3in1. Family also requested adjustable hospital bed. All orders placed. Case Manager aware.  Corrin Parker, PA-C 12/17/2020 2:36 PM 1808

## 2020-12-20 ENCOUNTER — Telehealth: Payer: Self-pay | Admitting: Internal Medicine

## 2020-12-20 ENCOUNTER — Other Ambulatory Visit: Payer: Self-pay | Admitting: Physical Medicine & Rehabilitation

## 2020-12-20 DIAGNOSIS — M1990 Unspecified osteoarthritis, unspecified site: Secondary | ICD-10-CM | POA: Diagnosis not present

## 2020-12-20 DIAGNOSIS — I442 Atrioventricular block, complete: Secondary | ICD-10-CM | POA: Diagnosis not present

## 2020-12-20 DIAGNOSIS — M542 Cervicalgia: Secondary | ICD-10-CM | POA: Diagnosis not present

## 2020-12-20 DIAGNOSIS — I10 Essential (primary) hypertension: Secondary | ICD-10-CM | POA: Diagnosis not present

## 2020-12-20 DIAGNOSIS — I4821 Permanent atrial fibrillation: Secondary | ICD-10-CM | POA: Diagnosis not present

## 2020-12-20 DIAGNOSIS — Z48812 Encounter for surgical aftercare following surgery on the circulatory system: Secondary | ICD-10-CM | POA: Diagnosis not present

## 2020-12-20 DIAGNOSIS — G8929 Other chronic pain: Secondary | ICD-10-CM | POA: Diagnosis not present

## 2020-12-20 DIAGNOSIS — D696 Thrombocytopenia, unspecified: Secondary | ICD-10-CM | POA: Diagnosis not present

## 2020-12-20 DIAGNOSIS — M47819 Spondylosis without myelopathy or radiculopathy, site unspecified: Secondary | ICD-10-CM | POA: Diagnosis not present

## 2020-12-20 DIAGNOSIS — R001 Bradycardia, unspecified: Secondary | ICD-10-CM | POA: Diagnosis not present

## 2020-12-20 DIAGNOSIS — E119 Type 2 diabetes mellitus without complications: Secondary | ICD-10-CM | POA: Diagnosis not present

## 2020-12-20 NOTE — Telephone Encounter (Signed)
Ok to give verbal orders? Please advise  

## 2020-12-20 NOTE — Telephone Encounter (Signed)
Fine

## 2020-12-20 NOTE — Telephone Encounter (Signed)
° °  Rolm Gala from [facility] calling for verbal orders  [facility] ORDERS   Caller Name: [facility] Agency Name: Mauri Brooklyn Phone #: [phone] Service Requested: PT (examples: OT/PT/Skilled Nursing/Social Work/Speech Therapy/Wound Care) Frequency of Visits: 2W3, [phone]

## 2020-12-21 ENCOUNTER — Ambulatory Visit: Payer: Medicare HMO | Admitting: Cardiology

## 2020-12-21 NOTE — Telephone Encounter (Signed)
Unable to get in contact with Erick from [facility]. VM is full. I will try to contact again later.

## 2020-12-22 ENCOUNTER — Institutional Professional Consult (permissible substitution): Payer: Medicare HMO | Admitting: Pulmonary Disease

## 2020-12-22 ENCOUNTER — Ambulatory Visit: Payer: Medicare HMO | Admitting: Physical Medicine & Rehabilitation

## 2020-12-22 NOTE — Telephone Encounter (Signed)
LDVM giving Alyssa Lindsey verbal orders for the patient. Office number was provided in case he has additional questions or concerns.

## 2020-12-24 ENCOUNTER — Other Ambulatory Visit (HOSPITAL_COMMUNITY): Payer: Medicare HMO

## 2020-12-24 DIAGNOSIS — M1990 Unspecified osteoarthritis, unspecified site: Secondary | ICD-10-CM | POA: Diagnosis not present

## 2020-12-24 DIAGNOSIS — E119 Type 2 diabetes mellitus without complications: Secondary | ICD-10-CM | POA: Diagnosis not present

## 2020-12-24 DIAGNOSIS — I4821 Permanent atrial fibrillation: Secondary | ICD-10-CM | POA: Diagnosis not present

## 2020-12-24 DIAGNOSIS — D696 Thrombocytopenia, unspecified: Secondary | ICD-10-CM | POA: Diagnosis not present

## 2020-12-24 DIAGNOSIS — G8929 Other chronic pain: Secondary | ICD-10-CM | POA: Diagnosis not present

## 2020-12-24 DIAGNOSIS — M542 Cervicalgia: Secondary | ICD-10-CM | POA: Diagnosis not present

## 2020-12-24 DIAGNOSIS — I10 Essential (primary) hypertension: Secondary | ICD-10-CM | POA: Diagnosis not present

## 2020-12-24 DIAGNOSIS — I442 Atrioventricular block, complete: Secondary | ICD-10-CM | POA: Diagnosis not present

## 2020-12-24 DIAGNOSIS — M47819 Spondylosis without myelopathy or radiculopathy, site unspecified: Secondary | ICD-10-CM | POA: Diagnosis not present

## 2020-12-24 DIAGNOSIS — Z48812 Encounter for surgical aftercare following surgery on the circulatory system: Secondary | ICD-10-CM | POA: Diagnosis not present

## 2020-12-26 ENCOUNTER — Other Ambulatory Visit: Payer: Self-pay | Admitting: *Deleted

## 2020-12-26 DIAGNOSIS — M47819 Spondylosis without myelopathy or radiculopathy, site unspecified: Secondary | ICD-10-CM | POA: Diagnosis not present

## 2020-12-26 DIAGNOSIS — M1990 Unspecified osteoarthritis, unspecified site: Secondary | ICD-10-CM | POA: Diagnosis not present

## 2020-12-26 DIAGNOSIS — M542 Cervicalgia: Secondary | ICD-10-CM | POA: Diagnosis not present

## 2020-12-26 DIAGNOSIS — E119 Type 2 diabetes mellitus without complications: Secondary | ICD-10-CM | POA: Diagnosis not present

## 2020-12-26 DIAGNOSIS — I4821 Permanent atrial fibrillation: Secondary | ICD-10-CM | POA: Diagnosis not present

## 2020-12-26 DIAGNOSIS — D696 Thrombocytopenia, unspecified: Secondary | ICD-10-CM | POA: Diagnosis not present

## 2020-12-26 DIAGNOSIS — G8929 Other chronic pain: Secondary | ICD-10-CM | POA: Diagnosis not present

## 2020-12-26 DIAGNOSIS — I10 Essential (primary) hypertension: Secondary | ICD-10-CM | POA: Diagnosis not present

## 2020-12-26 DIAGNOSIS — Z48812 Encounter for surgical aftercare following surgery on the circulatory system: Secondary | ICD-10-CM | POA: Diagnosis not present

## 2020-12-26 DIAGNOSIS — I442 Atrioventricular block, complete: Secondary | ICD-10-CM | POA: Diagnosis not present

## 2020-12-26 NOTE — Patient Outreach (Signed)
[facility]) Care Management  12/26/2020  Alyssa Lindsey October 20, 1929 [id number]   [facility] outreach for EMMI-general discharge D/c from [facility] RED ON EMMI ALERT Day #       1    Date: Monday 12/19/20 1316 patient Red Alert Reason: wounds healing well? No    Insurance: [employer]etna medicare [facility] admissions x  1 ED visits x 1 in the last 6 months  [facility]  [facility] Unsuccessful outreach   Outreach attempt to the home number  No answer. [facility] RN CM left HIPAA [facility] Portability and Accountability Act) compliant voicemail message along with CM's contact info.   Plan: [facility] RN CM scheduled this patient for another call attempt within 4-7 business days Unsuccessful outreach letter sent  Cala Bradford L. [name], RN, BSN, CCM [facility] Telephonic Care Management Care Coordinator Office number [phone] Mobile number [phone]  Main [facility] number [phone] Fax number [phone]

## 2020-12-28 ENCOUNTER — Institutional Professional Consult (permissible substitution): Payer: Medicare HMO | Admitting: Internal Medicine

## 2020-12-29 ENCOUNTER — Ambulatory Visit (INDEPENDENT_AMBULATORY_CARE_PROVIDER_SITE_OTHER): Payer: Medicare HMO | Admitting: Emergency Medicine

## 2020-12-29 ENCOUNTER — Other Ambulatory Visit: Payer: Self-pay

## 2020-12-29 DIAGNOSIS — Z950 Presence of cardiac pacemaker: Secondary | ICD-10-CM | POA: Diagnosis not present

## 2020-12-29 DIAGNOSIS — R001 Bradycardia, unspecified: Secondary | ICD-10-CM | POA: Diagnosis not present

## 2020-12-29 LAB — CUP PACEART INCLINIC DEVICE CHECK
Battery Remaining Longevity: 159 mo
Battery Voltage: 3.21 V
Brady Statistic RV Percent Paced: 99.8 %
Date Time Interrogation Session: 20220203110733
Implantable Lead Implant Date: 20220121
Implantable Lead Location: 753860
Implantable Lead Model: 3830
Implantable Pulse Generator Implant Date: 20220121
Lead Channel Impedance Value: 437 Ohm
Lead Channel Impedance Value: 646 Ohm
Lead Channel Pacing Threshold Amplitude: 0.75 V
Lead Channel Pacing Threshold Pulse Width: 0.4 ms
Lead Channel Sensing Intrinsic Amplitude: 14.875 mV
Lead Channel Setting Pacing Amplitude: 3.5 V
Lead Channel Setting Pacing Pulse Width: 0.4 ms
Lead Channel Setting Sensing Sensitivity: 0.9 mV

## 2020-12-29 NOTE — Progress Notes (Signed)
Wound check appointment. Steri-strips removed. Wound without redness or edema. Incision edges approximated, wound well healed. Normal device function. Thresholds, sensing, and impedances consistent with implant measurements. Device programmed at 3.5V/auto capture programmed on for extra safety margin until 3 month visit. Histogram distribution appropriate for patient and level of activity. No high ventricular rates noted. Patient educated about wound care, arm mobility, lifting restrictions. ROV in 3 months with implanting physician. 

## 2020-12-30 DIAGNOSIS — M542 Cervicalgia: Secondary | ICD-10-CM | POA: Diagnosis not present

## 2020-12-30 DIAGNOSIS — I442 Atrioventricular block, complete: Secondary | ICD-10-CM | POA: Diagnosis not present

## 2020-12-30 DIAGNOSIS — K2970 Gastritis, unspecified, without bleeding: Secondary | ICD-10-CM

## 2020-12-30 DIAGNOSIS — D696 Thrombocytopenia, unspecified: Secondary | ICD-10-CM | POA: Diagnosis not present

## 2020-12-30 DIAGNOSIS — E119 Type 2 diabetes mellitus without complications: Secondary | ICD-10-CM | POA: Diagnosis not present

## 2020-12-30 DIAGNOSIS — Z950 Presence of cardiac pacemaker: Secondary | ICD-10-CM

## 2020-12-30 DIAGNOSIS — M1990 Unspecified osteoarthritis, unspecified site: Secondary | ICD-10-CM | POA: Diagnosis not present

## 2020-12-30 DIAGNOSIS — M47819 Spondylosis without myelopathy or radiculopathy, site unspecified: Secondary | ICD-10-CM | POA: Diagnosis not present

## 2020-12-30 DIAGNOSIS — K219 Gastro-esophageal reflux disease without esophagitis: Secondary | ICD-10-CM

## 2020-12-30 DIAGNOSIS — I4821 Permanent atrial fibrillation: Secondary | ICD-10-CM | POA: Diagnosis not present

## 2020-12-30 DIAGNOSIS — K449 Diaphragmatic hernia without obstruction or gangrene: Secondary | ICD-10-CM

## 2020-12-30 DIAGNOSIS — K5730 Diverticulosis of large intestine without perforation or abscess without bleeding: Secondary | ICD-10-CM

## 2020-12-30 DIAGNOSIS — G8929 Other chronic pain: Secondary | ICD-10-CM | POA: Diagnosis not present

## 2020-12-30 DIAGNOSIS — I10 Essential (primary) hypertension: Secondary | ICD-10-CM | POA: Diagnosis not present

## 2020-12-30 DIAGNOSIS — Z48812 Encounter for surgical aftercare following surgery on the circulatory system: Secondary | ICD-10-CM | POA: Diagnosis not present

## 2020-12-31 ENCOUNTER — Other Ambulatory Visit: Payer: Self-pay | Admitting: Adult Health

## 2020-12-31 ENCOUNTER — Other Ambulatory Visit: Payer: Self-pay | Admitting: Cardiology

## 2020-12-31 DIAGNOSIS — I480 Paroxysmal atrial fibrillation: Secondary | ICD-10-CM

## 2021-01-02 NOTE — Telephone Encounter (Signed)
Prescription refill request for Xarelto received.  Indication:atrial fibrillation Last office visit:12/21 hochrein Weight:84.8 kg Age:87 Scr:1.28  1/22 CrCl:39.89 ml/min  Prescription refilled

## 2021-01-03 ENCOUNTER — Other Ambulatory Visit: Payer: Self-pay

## 2021-01-03 ENCOUNTER — Other Ambulatory Visit: Payer: Self-pay | Admitting: *Deleted

## 2021-01-03 ENCOUNTER — Encounter: Payer: Self-pay | Admitting: *Deleted

## 2021-01-03 DIAGNOSIS — I10 Essential (primary) hypertension: Secondary | ICD-10-CM | POA: Diagnosis not present

## 2021-01-03 DIAGNOSIS — M542 Cervicalgia: Secondary | ICD-10-CM | POA: Diagnosis not present

## 2021-01-03 DIAGNOSIS — I4821 Permanent atrial fibrillation: Secondary | ICD-10-CM | POA: Diagnosis not present

## 2021-01-03 DIAGNOSIS — E119 Type 2 diabetes mellitus without complications: Secondary | ICD-10-CM | POA: Diagnosis not present

## 2021-01-03 DIAGNOSIS — Z48812 Encounter for surgical aftercare following surgery on the circulatory system: Secondary | ICD-10-CM | POA: Diagnosis not present

## 2021-01-03 DIAGNOSIS — I442 Atrioventricular block, complete: Secondary | ICD-10-CM | POA: Diagnosis not present

## 2021-01-03 DIAGNOSIS — G8929 Other chronic pain: Secondary | ICD-10-CM | POA: Diagnosis not present

## 2021-01-03 DIAGNOSIS — M47819 Spondylosis without myelopathy or radiculopathy, site unspecified: Secondary | ICD-10-CM | POA: Diagnosis not present

## 2021-01-03 DIAGNOSIS — D696 Thrombocytopenia, unspecified: Secondary | ICD-10-CM | POA: Diagnosis not present

## 2021-01-03 DIAGNOSIS — M1990 Unspecified osteoarthritis, unspecified site: Secondary | ICD-10-CM | POA: Diagnosis not present

## 2021-01-03 NOTE — Patient Outreach (Signed)
[facility]) Care Management  01/03/2021  Alyssa Lindsey 11-06-1929 [id number]   [facility] outreach for EMMI-general discharge D/c from [facility] RED ON EMMI ALERT Day #       1    Date: Monday 12/19/20 1316 patient Red Alert Reason: wounds healing well? No    Insurance: [facility] admissions x  1 ED visits x 1 in the last 6 months  Last admission 12/15/20-12/17/20 systemic bradycardia s/p medtronic single chamber pace maker implantation , Diabetes (DM) type 2 Hypertension (HTN) GERD (gastroesophageal reflux disease) Atrial fibrillation with slow ventricular rate [facility]  [facility] outreach assessment  Outreach attempt to the home number  No answer. [facility] RN CM left HIPAA [facility] Portability and Accountability Act) compliant voicemail message along with CM's contact info.   Daughter Alyssa Lindsey returned a call after message was left   She is able to verify HIPAA [facility] Portability and Accountability Act) identifiers Reviewed and addressed the purpose of the follow up call with the her   Consent: [facility] ([facility] Customer service manager) RN CM reviewed [facility] services with patient. Patient gave verbal consent for services.   Post hospital  Not eating well but takes in ensure Discussed supplements, proteins for healing, small meals 2-3 hours sent EMMI information for high protein Pt initially was reported to have increase sleep after discharge home [facility] RN CM discusses reasons and answered questions   Diabetes Not on taking cbgs as no longer on medications  [facility] visits, returns on Friday & daughter has patient to complete exercise taught between visits Sent EMMI via e-mail on cardiac pacemaker, high calorie, high protein diet, bradycardia, heart block in adults  Plan: [facility] RN CM scheduled this patient for another call attempt within 21-28 business days Patient agrees to care plan and follow up Pt encouraged to return a call to [facility] RN CM  prn  [name] L. [name], RN, BSN, CCM [facility] Telephonic Care Management Care Coordinator Office number [phone] Mobile number [phone]  Main [facility] number [phone] Fax number [phone]

## 2021-01-05 ENCOUNTER — Encounter: Payer: Medicare HMO | Admitting: Physical Medicine & Rehabilitation

## 2021-01-05 DIAGNOSIS — E119 Type 2 diabetes mellitus without complications: Secondary | ICD-10-CM | POA: Diagnosis not present

## 2021-01-05 DIAGNOSIS — D696 Thrombocytopenia, unspecified: Secondary | ICD-10-CM | POA: Diagnosis not present

## 2021-01-05 DIAGNOSIS — I4821 Permanent atrial fibrillation: Secondary | ICD-10-CM | POA: Diagnosis not present

## 2021-01-05 DIAGNOSIS — I442 Atrioventricular block, complete: Secondary | ICD-10-CM | POA: Diagnosis not present

## 2021-01-05 DIAGNOSIS — G8929 Other chronic pain: Secondary | ICD-10-CM | POA: Diagnosis not present

## 2021-01-05 DIAGNOSIS — M1990 Unspecified osteoarthritis, unspecified site: Secondary | ICD-10-CM | POA: Diagnosis not present

## 2021-01-05 DIAGNOSIS — M47819 Spondylosis without myelopathy or radiculopathy, site unspecified: Secondary | ICD-10-CM | POA: Diagnosis not present

## 2021-01-05 DIAGNOSIS — Z48812 Encounter for surgical aftercare following surgery on the circulatory system: Secondary | ICD-10-CM | POA: Diagnosis not present

## 2021-01-05 DIAGNOSIS — M542 Cervicalgia: Secondary | ICD-10-CM | POA: Diagnosis not present

## 2021-01-05 DIAGNOSIS — I10 Essential (primary) hypertension: Secondary | ICD-10-CM | POA: Diagnosis not present

## 2021-01-10 ENCOUNTER — Other Ambulatory Visit: Payer: Self-pay

## 2021-01-10 DIAGNOSIS — M47819 Spondylosis without myelopathy or radiculopathy, site unspecified: Secondary | ICD-10-CM | POA: Diagnosis not present

## 2021-01-10 DIAGNOSIS — Z48812 Encounter for surgical aftercare following surgery on the circulatory system: Secondary | ICD-10-CM | POA: Diagnosis not present

## 2021-01-10 DIAGNOSIS — M1990 Unspecified osteoarthritis, unspecified site: Secondary | ICD-10-CM | POA: Diagnosis not present

## 2021-01-10 DIAGNOSIS — I4821 Permanent atrial fibrillation: Secondary | ICD-10-CM | POA: Diagnosis not present

## 2021-01-10 DIAGNOSIS — I10 Essential (primary) hypertension: Secondary | ICD-10-CM | POA: Diagnosis not present

## 2021-01-10 DIAGNOSIS — M542 Cervicalgia: Secondary | ICD-10-CM | POA: Diagnosis not present

## 2021-01-10 DIAGNOSIS — E119 Type 2 diabetes mellitus without complications: Secondary | ICD-10-CM | POA: Diagnosis not present

## 2021-01-10 DIAGNOSIS — D696 Thrombocytopenia, unspecified: Secondary | ICD-10-CM | POA: Diagnosis not present

## 2021-01-10 DIAGNOSIS — I442 Atrioventricular block, complete: Secondary | ICD-10-CM | POA: Diagnosis not present

## 2021-01-10 DIAGNOSIS — G8929 Other chronic pain: Secondary | ICD-10-CM | POA: Diagnosis not present

## 2021-01-12 ENCOUNTER — Other Ambulatory Visit: Payer: Self-pay

## 2021-01-12 ENCOUNTER — Encounter: Payer: Self-pay | Admitting: Internal Medicine

## 2021-01-12 ENCOUNTER — Ambulatory Visit (INDEPENDENT_AMBULATORY_CARE_PROVIDER_SITE_OTHER): Payer: Medicare HMO | Admitting: Internal Medicine

## 2021-01-12 VITALS — BP 134/70 | HR 62 | Temp 99.0°F | Ht 60.0 in | Wt 173.0 lb

## 2021-01-12 DIAGNOSIS — E860 Dehydration: Secondary | ICD-10-CM | POA: Diagnosis not present

## 2021-01-12 DIAGNOSIS — R350 Frequency of micturition: Secondary | ICD-10-CM | POA: Diagnosis not present

## 2021-01-12 DIAGNOSIS — R0609 Other forms of dyspnea: Secondary | ICD-10-CM

## 2021-01-12 DIAGNOSIS — R531 Weakness: Secondary | ICD-10-CM | POA: Diagnosis not present

## 2021-01-12 DIAGNOSIS — R0600 Dyspnea, unspecified: Secondary | ICD-10-CM | POA: Diagnosis not present

## 2021-01-12 LAB — BASIC METABOLIC PANEL
BUN: 16 mg/dL (ref 6–23)
CO2: 30 mEq/L (ref 19–32)
Calcium: 9.6 mg/dL (ref 8.4–10.5)
Chloride: 103 mEq/L (ref 96–112)
Creatinine, Ser: 1.42 mg/dL — ABNORMAL HIGH (ref 0.40–1.20)
GFR: 32.73 mL/min — ABNORMAL LOW (ref >60.00)
Glucose, Bld: 170 mg/dL — ABNORMAL HIGH (ref 70–99)
Potassium: 4.6 mEq/L (ref 3.5–5.1)
Sodium: 139 mEq/L (ref 135–145)

## 2021-01-12 LAB — CBC WITH DIFFERENTIAL/PLATELET
Basophils Absolute: 0 10*3/uL (ref 0.0–0.1)
Basophils Relative: 0.4 % (ref 0.0–3.0)
Eosinophils Absolute: 0.1 10*3/uL (ref 0.0–0.7)
Eosinophils Relative: 1.8 % (ref 0.0–5.0)
HCT: 44.2 % (ref 36.0–46.0)
Hemoglobin: 14.2 g/dL (ref 12.0–15.0)
Lymphocytes Relative: 22.1 % (ref 12.0–46.0)
Lymphs Abs: 1.5 10*3/uL (ref 0.7–4.0)
MCHC: 32.1 g/dL (ref 30.0–36.0)
MCV: 83.8 fl (ref 78.0–100.0)
Monocytes Absolute: 1 10*3/uL (ref 0.1–1.0)
Monocytes Relative: 15.4 % — ABNORMAL HIGH (ref 3.0–12.0)
Neutro Abs: 4 10*3/uL (ref 1.4–7.7)
Neutrophils Relative %: 60.3 % (ref 43.0–77.0)
Platelets: 156 10*3/uL (ref 150.0–400.0)
RBC: 5.28 Mil/uL — ABNORMAL HIGH (ref 3.87–5.11)
RDW: 17.3 % — ABNORMAL HIGH (ref 11.5–15.5)
WBC: 6.7 10*3/uL (ref 4.0–10.5)

## 2021-01-12 LAB — URINALYSIS, ROUTINE W REFLEX MICROSCOPIC
Bilirubin Urine: NEGATIVE
Hgb urine dipstick: NEGATIVE
Ketones, ur: NEGATIVE
Leukocytes,Ua: NEGATIVE
Nitrite: NEGATIVE
RBC / HPF: NONE SEEN (ref 0–?)
Specific Gravity, Urine: 1.015 (ref 1.000–1.030)
Total Protein, Urine: NEGATIVE
Urine Glucose: NEGATIVE
Urobilinogen, UA: 1 (ref 0.0–1.0)
pH: 7 (ref 5.0–8.0)

## 2021-01-12 LAB — HEPATIC FUNCTION PANEL
ALT: 9 U/L (ref 0–35)
AST: 12 U/L (ref 0–37)
Albumin: 3.8 g/dL (ref 3.5–5.2)
Alkaline Phosphatase: 70 U/L (ref 39–117)
Bilirubin, Direct: 0.2 mg/dL (ref 0.0–0.3)
Total Bilirubin: 0.7 mg/dL (ref 0.2–1.2)
Total Protein: 6.8 g/dL (ref 6.0–8.3)

## 2021-01-12 MED ORDER — CIPROFLOXACIN HCL 500 MG PO TABS: 500.0000 mg | ORAL_TABLET | Freq: Two times a day (BID) | ORAL | 0 refills | Status: AC

## 2021-01-12 MED ORDER — CEFTRIAXONE SODIUM 1 G IJ SOLR
1.0000 g | Freq: Once | INTRAMUSCULAR | Status: AC
Start: 2021-01-12 — End: 2021-01-12
  Administered 2021-01-12: 1 g via INTRAMUSCULAR

## 2021-01-12 NOTE — Assessment & Plan Note (Addendum)
Acute likely UTI related, has seen cardiology and doubt significant issue related to recent PPM, also for labs as ordered today

## 2021-01-12 NOTE — Progress Notes (Signed)
Patient ID: Alyssa Lindsey, female   DOB: Jun 01, 1929, 87 y.o.   MRN: [id number]        Chief Complaint: urinary frequency, decreased po intake, generalized weakness for 1 wk       HPI:  Alyssa Lindsey is a 87 y.o. female here with daughter, has hx of dementia, but daughter lives with her and states pt has strong odor to urine, urinary frequency (so much she has held her lasix/hct due to the overflow incontinence episodes as cant get to BR fast enough) but Denies urinary symptoms such as dysuria, urgency, flank pain, hematuria or n/v, fever, chills. But has had decreased po intake and generalized weakness as well for 1 wk.  Pt denies chest pain, increased sob or doe, wheezing, orthopnea, PND, increased LE swelling, palpitations, dizziness or syncope.   Pt denies polydipsia, polyuria,  .        Wt Readings from Last 3 Encounters:  01/12/21 173 lb (78.5 kg)  12/15/20 187 lb (84.8 kg)  12/15/20 187 lb (84.8 kg)   BP Readings from Last 3 Encounters:  01/12/21 134/70  12/17/20 (!) 150/73  12/15/20 116/64         Past Medical History:  Diagnosis Date  . Arthritis   . Atrial fibrillation (HCC)   . Chronic neck pain   . Diverticulosis of colon (without mention of hemorrhage)   . Family history of malignant neoplasm of gastrointestinal tract   . Gastritis   . GERD (gastroesophageal reflux disease)   . Hiatal hernia   . HTN (hypertension)   . LBP (low back pain)   . Memory problem   . Spondylosis   . Thrombocytopenia (HCC)   . Type II or unspecified type diabetes mellitus without mention of complication, not stated as uncontrolled    Past Surgical History:  Procedure Laterality Date  . COLONOSCOPY  2010   NORMAL-- DUE 2020  . PACEMAKER IMPLANT N/A 12/16/2020   Procedure: PACEMAKER IMPLANT;  Surgeon: Marinus Maw, MD;  [facility] INVASIVE CV LAB;  Service: Cardiovascular;  Laterality: N/A;  . TUBAL LIGATION      reports that she has never smoked. She has never used smokeless  tobacco. She reports that she does not drink alcohol and does not use drugs. family history includes Breast cancer in her sister; Colon cancer in her daughter; Diabetes in her daughter; Emphysema in her brother; Liver disease in her brother; Lung cancer in her daughter; Ovarian cancer in her sister; Stroke (age of onset: 52) in her father; Throat cancer in her brother. Allergies  Allergen Reactions  . Dicyclomine Hcl Other (See Comments)    Severe eye pain  . Lisinopril Cough  . Aspirin Other (See Comments)    Unknown reaction   Current Outpatient Medications on File Prior to Visit  Medication Sig Dispense Refill  . acetaminophen (TYLENOL) 650 MG CR tablet Take 1,300 mg by mouth every 8 (eight) hours as needed for pain.    [name] albuterol (PROVENTIL) (2.5 MG/3ML) 0.083% nebulizer solution Take 3 mLs (2.5 mg total) by nebulization every 6 (six) hours as needed for wheezing or shortness of breath. 150 mL 1  . albuterol (VENTOLIN HFA) 108 (90 Base) MCG/ACT inhaler Inhale 2 puffs into the lungs every 8 (eight) hours. (Patient taking differently: Inhale 2 puffs into the lungs every 8 (eight) hours as needed for wheezing or shortness of breath.) 8 g 2  . amLODipine (NORVASC) 5 MG tablet Take 1 tablet (5 mg total)  by mouth daily. 90 tablet 3  . Capsaicin 0.075 % STCK .075% topically twice per day 1 g 1  . cetirizine (ZYRTEC) 10 MG tablet TAKE ONE TABLET BY MOUTH ONCE DAILY (Patient taking differently: Take 10 mg by mouth daily.) 30 tablet 11  . famotidine (PEPCID) 20 MG tablet Take 1 tablet (20 mg total) by mouth 2 (two) times daily. (Patient taking differently: Take 20 mg by mouth 2 (two) times daily as needed (stomach pain).) 60 tablet 2  . furosemide (LASIX) 20 MG tablet Take 1 tablet (20 mg total) by mouth as needed for edema. (Patient taking differently: Take 20 mg by mouth daily.) 90 tablet 3  . hydrochlorothiazide (MICROZIDE) 12.5 MG capsule Take 12.5 mg by mouth daily.    [name] lidocaine (LIDODERM) 5 %  Place 2 patches onto the skin daily. Remove & Discard patch within 12 hours or as directed by MD (Patient taking differently: Place 1-2 patches onto the skin daily as needed (pain). Remove & Discard patch within 12 hours or as directed by MD) 60 patch 0  . losartan (COZAAR) 50 MG tablet Take 1 tablet by mouth once daily 90 tablet 3  . mirtazapine (REMERON) 30 MG tablet Take 1 tablet (30 mg total) by mouth at bedtime. 90 tablet 3  . NONFORMULARY OR COMPOUNDED ITEM Ketamine 10%, Baclofen 2%, Cyclobenzaprine 2%, Ketoprofen 10%, Gabapentin 6%, Lidocaine 1%, Amitryptiline 5% (Patient taking differently: Apply 1 application topically daily as needed (pain). Ketamine 10%, Baclofen 2%, Cyclobenzaprine 2%, Ketoprofen 10%, Gabapentin 6%, Lidocaine 1%, Amitryptiline 5% (compounded at [facility])) 1 each 1  . pregabalin (LYRICA) 75 MG capsule Take 1 capsule by mouth twice daily 60 capsule 0  . umeclidinium-vilanterol (ANORO ELLIPTA) 62.5-25 MCG/INH AEPB Inhale 1 puff into the lungs daily. 30 each 3  . XARELTO 15 MG TABS tablet TAKE 1 TABLET BY MOUTH ONCE DAILY WITH  SUPPER 60 tablet 5   No current facility-administered medications on file prior to visit.        ROS:  All others reviewed and negative.  Objective        PE:  BP 134/70   Pulse 62   Temp 99 F (37.2 C) (Oral)   Ht 5' (1.524 m)   Wt 173 lb (78.5 kg)   SpO2 97%   BMI 33.79 kg/m                 Constitutional: Pt appears in NAD               HENT: Head: NCAT.                Right Ear: External ear normal.                 Left Ear: External ear normal.                Eyes: . Pupils are equal, round, and reactive to light. Conjunctivae and EOM are normal               Nose: without d/c or deformity               Neck: Neck supple. Gross normal ROM               Cardiovascular: Normal rate and regular rhythm.                 Pulmonary/Chest: Effort normal and breath sounds without rales or wheezing.  Abd:  Soft, NT,  ND, + BS, no organomegaly               Neurological: Pt is alert. At baseline orientation, motor grossly intact               Skin: Skin is warm. No rashes, no other new lesions, LE edema - trace left ankle/pedal only               Psychiatric: Pt behavior is normal without agitation   Micro: none  Cardiac tracings I have personally interpreted today:  none  Pertinent Radiological findings (summarize): none   Lab Results  Component Value Date   WBC 6.7 01/12/2021   HGB 14.2 01/12/2021   HCT 44.2 01/12/2021   PLT 156.0 01/12/2021   GLUCOSE 170 (H) 01/12/2021   CHOL 144 09/11/2019   TRIG 125.0 09/11/2019   HDL 40.70 09/11/2019   LDLCALC 79 09/11/2019   ALT 9 01/12/2021   AST 12 01/12/2021   NA 139 01/12/2021   K 4.6 01/12/2021   CL 103 01/12/2021   CREATININE 1.42 (H) 01/12/2021   BUN 16 01/12/2021   CO2 30 01/12/2021   TSH 1.350 10/29/2019   INR 0.98 03/04/2014   HGBA1C 6.7 (H) 06/27/2020   MICROALBUR 1.1 09/11/2019   Assessment/Plan:  WILLA BROCKS is a 87 y.o. Black or African American [2] female with  has a past medical history of Arthritis, Atrial fibrillation (HCC), Chronic neck pain, Diverticulosis of colon (without mention of hemorrhage), Family history of malignant neoplasm of gastrointestinal tract, Gastritis, GERD (gastroesophageal reflux disease), Hiatal hernia, HTN (hypertension), LBP (low back pain), Memory problem, Spondylosis, Thrombocytopenia (HCC), and Type II or unspecified type diabetes mellitus without mention of complication, not stated as uncontrolled.  Urinary frequency High suspicion UTI, but without sepsis, n/v, flank pain or gross hematuria - for UA and culture with labs, also for rocephin 1 gm IM now, then cephalexin course, and f/u lab culture  Generalized weakness Acute likely UTI related, has seen cardiology and doubt significant issue related to recent PPM, also for labs as ordered today  DOE (dyspnea on exertion) Exam benign, ok to  continue the albut hfa prn, but more likely related to mild low volume  Dehydration Mild, but fortunately not exacerbated by dual chronic diuretic use of lasix/hct as family has held for the past wk; volume stable and most likely mild low, so to continue hold diuretic for 5 days, and f/u PCP in 1 wk  Followup: Return if symptoms worsen or fail to improve.  Oliver Barre, MD 01/14/2021 4:48 PM [facility] Internal Medicine

## 2021-01-12 NOTE — Assessment & Plan Note (Signed)
Exam benign, ok to continue the albut hfa prn, but more likely related to mild low volume

## 2021-01-12 NOTE — Assessment & Plan Note (Signed)
High suspicion UTI, but without sepsis, n/v, flank pain or gross hematuria - for UA and culture with labs, also for rocephin 1 gm IM now, then cephalexin course, and f/u lab culture

## 2021-01-12 NOTE — Assessment & Plan Note (Signed)
Mild, but fortunately not exacerbated by dual chronic diuretic use of lasix/hct as family has held for the past wk; volume stable and most likely mild low, so to continue hold diuretic for 5 days, and f/u PCP in 1 wk

## 2021-01-12 NOTE — Patient Instructions (Addendum)
You had the antibiotic shot today (rocephin)  Please take all new medication as prescribed - the pill antibiotic  Please take small sips of fluid frequently throughout the day for the next 2 -3 days  Please continue to hold the lasix and HCT fluid pills for the next 5 days, then restart  Please continue all other medications as before, and refills have been done if requested.  Please have the pharmacy call with any other refills you may need.  Please keep your appointments with your specialists as you may have planned  Please go to the LAB at the blood drawing area for the tests to be done  You will be contacted by phone if any changes need to be made immediately.  Otherwise, you will receive a letter about your results with an explanation, but please check with MyChart first.  Please remember to sign up for MyChart if you have not done so, as this will be important to you in the future with finding out test results, communicating by private email, and scheduling acute appointments online when needed.  Please see Dr Okey Dupre in 1 week, or go to the ED for any worsening such as vomiting, high fever or falls

## 2021-01-13 LAB — URINE CULTURE

## 2021-01-14 ENCOUNTER — Encounter: Payer: Self-pay | Admitting: Internal Medicine

## 2021-01-17 ENCOUNTER — Encounter: Payer: Self-pay | Admitting: Internal Medicine

## 2021-01-17 DIAGNOSIS — D696 Thrombocytopenia, unspecified: Secondary | ICD-10-CM | POA: Diagnosis not present

## 2021-01-17 DIAGNOSIS — M1990 Unspecified osteoarthritis, unspecified site: Secondary | ICD-10-CM | POA: Diagnosis not present

## 2021-01-17 DIAGNOSIS — I10 Essential (primary) hypertension: Secondary | ICD-10-CM | POA: Diagnosis not present

## 2021-01-17 DIAGNOSIS — E119 Type 2 diabetes mellitus without complications: Secondary | ICD-10-CM | POA: Diagnosis not present

## 2021-01-17 DIAGNOSIS — Z48812 Encounter for surgical aftercare following surgery on the circulatory system: Secondary | ICD-10-CM | POA: Diagnosis not present

## 2021-01-17 DIAGNOSIS — M47819 Spondylosis without myelopathy or radiculopathy, site unspecified: Secondary | ICD-10-CM | POA: Diagnosis not present

## 2021-01-17 DIAGNOSIS — M542 Cervicalgia: Secondary | ICD-10-CM | POA: Diagnosis not present

## 2021-01-17 DIAGNOSIS — I442 Atrioventricular block, complete: Secondary | ICD-10-CM | POA: Diagnosis not present

## 2021-01-17 DIAGNOSIS — G8929 Other chronic pain: Secondary | ICD-10-CM | POA: Diagnosis not present

## 2021-01-17 DIAGNOSIS — I4821 Permanent atrial fibrillation: Secondary | ICD-10-CM | POA: Diagnosis not present

## 2021-01-24 ENCOUNTER — Other Ambulatory Visit: Payer: Self-pay | Admitting: *Deleted

## 2021-01-24 ENCOUNTER — Other Ambulatory Visit: Payer: Self-pay

## 2021-01-24 DIAGNOSIS — I10 Essential (primary) hypertension: Secondary | ICD-10-CM | POA: Diagnosis not present

## 2021-01-24 DIAGNOSIS — G8929 Other chronic pain: Secondary | ICD-10-CM | POA: Diagnosis not present

## 2021-01-24 DIAGNOSIS — M1990 Unspecified osteoarthritis, unspecified site: Secondary | ICD-10-CM | POA: Diagnosis not present

## 2021-01-24 DIAGNOSIS — I442 Atrioventricular block, complete: Secondary | ICD-10-CM | POA: Diagnosis not present

## 2021-01-24 DIAGNOSIS — I4821 Permanent atrial fibrillation: Secondary | ICD-10-CM | POA: Diagnosis not present

## 2021-01-24 DIAGNOSIS — E119 Type 2 diabetes mellitus without complications: Secondary | ICD-10-CM | POA: Diagnosis not present

## 2021-01-24 DIAGNOSIS — Z48812 Encounter for surgical aftercare following surgery on the circulatory system: Secondary | ICD-10-CM | POA: Diagnosis not present

## 2021-01-24 DIAGNOSIS — M47819 Spondylosis without myelopathy or radiculopathy, site unspecified: Secondary | ICD-10-CM | POA: Diagnosis not present

## 2021-01-24 DIAGNOSIS — D696 Thrombocytopenia, unspecified: Secondary | ICD-10-CM | POA: Diagnosis not present

## 2021-01-24 DIAGNOSIS — M542 Cervicalgia: Secondary | ICD-10-CM | POA: Diagnosis not present

## 2021-01-24 NOTE — Patient Outreach (Addendum)
[facility]) Care Management  01/24/2021  Alyssa Lindsey 02/04/1929 [id number]  [facility] outreach to complex care patient referred by [facility]  RED ON EMMI ALERT Day #1Date: Monday 12/19/20 1316 patient Red Alert Reason:wounds healing well? No   Insurance:[name] [facility] admissions x1ED visits x1in the last 6 months  Last admission 12/15/20-12/17/20 systemic bradycardia s/p Medtronic single chamber pace maker implantation , Diabetes (DM) type 2Hypertension (HTN) GERD (gastroesophageal reflux disease) Atrial fibrillation with slow ventricular rate [facility]  [facility] outreach assessment  Outreach to the home number    Daughter Alyssa Lindsey answered She is able to verify HIPAA [employer] Portability and Accountability Act) identifiers Reviewed and addressed the purpose of the follow up call with the her  [facility] RN CM spoke with Alyssa Lindsey  Consent: [facility] Customer service manager) RN CM reviewed [facility] services with patient. Patient gave verbal consent for services.     Follow up assessment  Continues not eating well but takes in ensure prn Pt noted weak, listless during a HH PT session once since last outreach,   PCP was called Found with another urinary tract infection, dehydration Antibiotic and fluids ordered and pt improved Discussed appetite stimulants like megace. Questions answered   Assessed for poor fluid/food intake reasons pt likes coffee, reviewed importance of fluids and activity "drink and move" goal reviewed and agreed upon encouragement provided  Spoke with pt about importance of her working on the goals to "drink and move"  She was able to vocalize goals to work on for improvements  She reports she likes to pat her feet and this was encouraged as activity, good circulation  EMMIs did not arrive per daughter to e mail address, verified e mail address   [facility] re sent EMMIs via mail with healthy weight & wellness & megace    Atrial fibrillation 60-70 heart rate noted per daughter since last outreach Alyssa Lindsey also reports a change in bed was completed as Alyssa Lindsey was having difficulties getting in her previous bed as it was too high   Diabetes Not taking cbgs as no longer on medications  Last glucometer owned broken  Pt nor daughter prefers sticks for monitoring Consulted pcp via EPIC for possible need to obtain another meter  [facility] PT visited during outreach & daughter has patient to complete exercise taught between visits. Discussed and encouraged pt's compliance with post session exercises    Patient Active Problem List   Diagnosis Date Noted  . Urinary frequency 01/12/2021  . Generalized weakness 01/12/2021  . Dehydration 01/12/2021  . Symptomatic bradycardia 12/15/2020  . Myalgia 10/25/2020  . Stage 3a chronic kidney disease (HCC) 09/18/2020  . Leg swelling 09/18/2020  . Sleep disturbance 09/06/2020  . Chronic atrial fibrillation (HCC) 04/20/2020  . Thrombocytopenia (HCC) 04/18/2020  . Blood in sputum 04/18/2020  . Educated about COVID-19 virus infection 04/12/2020  . DOE (dyspnea on exertion) 04/12/2020  . Permanent atrial fibrillation (HCC) 04/12/2020  . PHN (postherpetic neuralgia) 12/02/2019  . Breast pain, left 10/06/2019  . Constipation 11/18/2017  . Adjustment disorder with mixed anxiety and depressed mood 07/06/2016  . Low back pain 05/09/2016  . Numbness and tingling of both legs 04/25/2016  . Bilateral leg edema 04/11/2016  . Routine general medical examination at a health care facility 12/26/2015  . GERD (gastroesophageal reflux disease) 03/02/2015  . Right knee pain 03/04/2014  . Chronic LLQ pain 02/07/2012  . B12 deficiency 03/06/2010  . 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  THYROID NODULE 03/03/2008  . BREAST MASS 03/03/2008  . Diabetes type 2, controlled (HCC) 06/21/2007  . Essential hypertension 06/21/2007    Plan: [facility] RN CM scheduled this  patient for another call attempt within 30 business days Patient agrees to care plan and follow up Pt encouraged to return a call to [facility] RN CM prn Goals Addressed              This Visit's Progress     Patient Stated   .  [facility]) Eat Healthy (pt-stated)        Timeframe:  Short-Term Goal Priority:  High Start Date:       01/24/21                      Expected End Date:    04/25/21                   Follow Up Date 02/28/21    - set a realistic goal  Increase fluid intake 2-4 glasses or more as tolerated Take ensure if a meal is missed     Notes:  01/24/21 pt agreed to work on goals to "drink and movePPG Industries L. [name], RN, BSN, CCM [facility] Telephonic Care Management Care Coordinator Office number [phone] Mobile number [phone]  Main [facility] number [phone] Fax number [phone]

## 2021-02-09 ENCOUNTER — Telehealth: Payer: Self-pay | Admitting: Internal Medicine

## 2021-02-09 NOTE — Telephone Encounter (Signed)
LVM for pt to rtn my call to schedule awv with nha. Please schedule awv if pt calls the office.  

## 2021-02-27 ENCOUNTER — Telehealth: Payer: Self-pay | Admitting: Internal Medicine

## 2021-02-27 NOTE — Telephone Encounter (Signed)
   Daughter [name] calling to report for the past 2 weeks patient has become more agitated than normal. She states the patient has been yelling and threatening to become violent against family members. [name] is requesting medication be prescribed   Please advise

## 2021-02-28 ENCOUNTER — Other Ambulatory Visit: Payer: Self-pay | Admitting: *Deleted

## 2021-02-28 ENCOUNTER — Other Ambulatory Visit: Payer: Self-pay

## 2021-02-28 ENCOUNTER — Encounter: Payer: Self-pay | Admitting: *Deleted

## 2021-02-28 NOTE — Telephone Encounter (Signed)
Spoke with the patient's daughter and she stated that the patient is trying to fight people that are trying to help her. She is not eating and wanting to drink her ensure. I advised the patient that Dr. Okey Dupre was out of the office until Thursday. She stated that she is able to handle her mother until the day of the appointment. She stated that if anything changes with her mother that she would give our office a call. No other questions or concerns at this time.

## 2021-02-28 NOTE — Patient Outreach (Addendum)
[facility]) Care Management  02/28/2021  Alyssa Lindsey 1929/02/25 [id number]   [facility] outreach to complex care patient referred by [facility]  RED ON EMMI ALERT Day #1Date: Monday 12/19/20 1316 patient Red Alert Reason:wounds healing well? No   Insurance:[name] [facility] admissions x1ED visits x1in the last 6 months Last admission 12/15/20-12/17/20 systemic bradycardia s/p Medtronic single chamber pace maker implantation ,Diabetes (DM) type 2Hypertension (HTN)GERD (gastroesophageal reflux disease)Atrial fibrillationwith slow ventricular rate [facility]  [employer] Patient's daughter, Alyssa Lindsey, is able to verify HIPAA [employer] Portability and Accountability Act) identifiers During this outreach Mrs Adderly is present She is ambulating around the home, in the kitchen and to the home's door Pt reported with memory issues Reviewed and addressed the purpose of the follow up call with the [place]  Consent: [facility] ([facility] Customer service manager) RN CM reviewed [facility] services with [employer]. [name] gave verbal consent for services.    Dementia [name] report Mrs Joy has had "Off and on behavior" since the last outreach Pt with increased agitation in last 2 weeks, yelling and threatening to be violent with other family members, especially [name]'s daughter [facility] RN CM inquired about urinary tract infection (UTI) symptoms Alyssa Lindsey is unaware as Alyssa Lindsey generally is more attentive to this as Alyssa Lindsey is visually impaired [facility] RN CM reviewed UTI symptoms and pt has reported low back pain dn pt is not eating and drinking enough per Alyssa Lindsey- Unsure of odor, color, frequency Pt was started on UTI treatment by Dr Jonny Ruiz on 01/12/21 and treatment completed  Alyssa Lindsey reports escalation of pt behavior with pt blaming one of her grand daughters for various home repairs needed and a crying grand baby Daughter is reporting pt with history of dementia- EPIC PMH  mentions memory issues Daughter states pt has not hurt others but she is verbally aggressive She does confirm Mrs Dorwart behavior at interval was noted prior to any memory issue confirmed Alyssa Lindsey states she can not recall pt being evaluated by a neurologist for possible staging of memory and is not on dementia medication Alyssa Lindsey was encouraged to discuss all options with pcp  Discussed how to redirect pt with dementia Alyssa Lindsey discussed and agreed to an in basket note to be sent to pcp as there is a pending appointment and she there is voiced concern the patient may be to agitated to transport to MD  [facility] RN CM sent the in basket note  Appetite/nutrition Not eating well nor drinking ensure  Has a appointment on Friday 03/03/21 with Dr Okey Dupre  She has a daughter, Alyssa Lindsey staying with her   Pt finished HH physical therapy  Still will not do taught exercises but is moving around in the home Pt goes outside at intervals.     Pain near left shoulder blade and left breast at intervals Pt with history of Shingles and had shingles shot after a shingles outbreak Pt per [place] the pat's last pain management appointment had to be cancelled  Has not rescheduled   Atrial fibrillation Home managed heart rate reported 60-72  No reported symptoms of worsening symptoms of atrial fibrillation  Diabetes  Dr Okey Dupre confirmed patient did not have to check her blood sugars per a follow up EPIC in basket message Shared this with Alyssa Lindsey Last HgbA1c was on 06/27/20 at 6.7  EMMI information sent to [name]'s e mail on urinary tract infection, dementia (including alzheimer) dementia caregiver, atrial fibrillation, CKD, acute low back pain  Past Medical History:  Diagnosis Date  .  Arthritis   . Atrial fibrillation (HCC)   . Chronic neck pain   . Diverticulosis of colon (without mention of hemorrhage)   . Family history of malignant neoplasm of gastrointestinal tract   . Gastritis   . GERD (gastroesophageal  reflux disease)   . Hiatal hernia   . HTN (hypertension)   . LBP (low back pain)   . Memory problem   . Spondylosis   . Thrombocytopenia (HCC)   . Type II or unspecified type diabetes mellitus without mention of complication, not stated as uncontrolled     Plan [facility] RN CM scheduled this patient for another call attempt within30business days Patient agrees to care plan and follow up Pt encouraged to return a call to [facility] RN CM prn   Kensley Valladares L. [name], RN, BSN, CCM [facility] Telephonic Care Management Care Coordinator Office number [phone] [facility] number [phone] Fax number [phone]

## 2021-02-28 NOTE — Telephone Encounter (Signed)
Follow up message   Daughter [name] calling seeking advice Please forward to covering provider

## 2021-03-02 NOTE — Telephone Encounter (Signed)
Why was this not sent to a provider in office?

## 2021-03-03 ENCOUNTER — Telehealth (INDEPENDENT_AMBULATORY_CARE_PROVIDER_SITE_OTHER): Payer: Medicare HMO | Admitting: Internal Medicine

## 2021-03-03 ENCOUNTER — Encounter: Payer: Self-pay | Admitting: Internal Medicine

## 2021-03-03 DIAGNOSIS — R69 Illness, unspecified: Secondary | ICD-10-CM | POA: Diagnosis not present

## 2021-03-03 DIAGNOSIS — F4323 Adjustment disorder with mixed anxiety and depressed mood: Secondary | ICD-10-CM

## 2021-03-03 MED ORDER — ALPRAZOLAM 0.25 MG PO TABS: 0.2500 mg | ORAL_TABLET | Freq: Two times a day (BID) | ORAL | 0 refills | Status: DC | PRN
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
## 2021-03-03 NOTE — Assessment & Plan Note (Signed)
Still taking remeron 30 mg qhs and working well for sleep. Having increased stress with family situation and will rx xanax prn short term. This does not sound to be related to memory issues.

## 2021-03-06 ENCOUNTER — Other Ambulatory Visit: Payer: Self-pay | Admitting: *Deleted

## 2021-03-06 ENCOUNTER — Encounter: Payer: Self-pay | Admitting: *Deleted

## 2021-03-06 MED ORDER — MIRTAZAPINE 45 MG PO TABS: 45.0000 mg | ORAL_TABLET | Freq: Every day | ORAL | 1 refills | Status: DC

## 2021-03-06 MED ORDER — DONEPEZIL HCL 5 MG PO TABS: 5.0000 mg | ORAL_TABLET | Freq: Every day | ORAL | 1 refills | Status: DC
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
## 2021-03-06 NOTE — Patient Outreach (Signed)
[facility]) Care Management  03/06/2021  Alyssa Lindsey 1929-03-12 [id number]   [facility] Care coordination-collaboration with pcp office   Spoke with Laurelyn Sickle at pcp office as discussed with daughter, [name] concern pt may become sick when not eating, drinking and taking medicines as ordered Laurelyn Sickle confirms outreach noted to family by provider via my chart system Discussed [facility] RN CM discuss with daughter related to possible resources   Plan  [facility] RN CM scheduled this patient for another call attempt within30business days Patient agrees to care plan and follow up Pt encouraged to return a call to [facility] RN CM prn  Cala Bradford L. [name], RN, BSN, CCM [facility] Telephonic Care Management Care Coordinator Office number [phone] Mobile number [phone]  Main [facility] number [phone] Fax number [phone]

## 2021-03-13 ENCOUNTER — Telehealth: Payer: Self-pay | Admitting: Internal Medicine

## 2021-03-13 NOTE — Telephone Encounter (Signed)
Patients daughter called and said that the patient is refusing to eat and take her meds. She was wondering if a referral for a home health nurse could be placed. Please advise

## 2021-03-13 NOTE — Telephone Encounter (Signed)
See below

## 2021-03-14 ENCOUNTER — Emergency Department (HOSPITAL_COMMUNITY): Payer: Medicare HMO

## 2021-03-14 ENCOUNTER — Other Ambulatory Visit: Payer: Self-pay | Admitting: *Deleted

## 2021-03-14 ENCOUNTER — Emergency Department (HOSPITAL_COMMUNITY)
Admission: EM | Admit: 2021-03-14 | Discharge: 2021-03-16 | Disposition: A | Discharge status: Home / Self Care | Payer: Medicare HMO | Attending: Emergency Medicine | Admitting: Emergency Medicine

## 2021-03-14 DIAGNOSIS — Z79899 Other long term (current) drug therapy: Secondary | ICD-10-CM | POA: Diagnosis not present

## 2021-03-14 DIAGNOSIS — G309 Alzheimer's disease, unspecified: Secondary | ICD-10-CM | POA: Insufficient documentation

## 2021-03-14 DIAGNOSIS — Z9114 Patient's other noncompliance with medication regimen: Secondary | ICD-10-CM

## 2021-03-14 DIAGNOSIS — Z950 Presence of cardiac pacemaker: Secondary | ICD-10-CM | POA: Insufficient documentation

## 2021-03-14 DIAGNOSIS — R4182 Altered mental status, unspecified: Secondary | ICD-10-CM | POA: Diagnosis not present

## 2021-03-14 DIAGNOSIS — I129 Hypertensive chronic kidney disease with stage 1 through stage 4 chronic kidney disease, or unspecified chronic kidney disease: Secondary | ICD-10-CM | POA: Insufficient documentation

## 2021-03-14 DIAGNOSIS — Z7901 Long term (current) use of anticoagulants: Secondary | ICD-10-CM | POA: Insufficient documentation

## 2021-03-14 DIAGNOSIS — Z20822 Contact with and (suspected) exposure to covid-19: Secondary | ICD-10-CM | POA: Diagnosis not present

## 2021-03-14 DIAGNOSIS — R451 Restlessness and agitation: Secondary | ICD-10-CM | POA: Diagnosis present

## 2021-03-14 DIAGNOSIS — E1122 Type 2 diabetes mellitus with diabetic chronic kidney disease: Secondary | ICD-10-CM | POA: Diagnosis not present

## 2021-03-14 DIAGNOSIS — N183 Chronic kidney disease, stage 3 unspecified: Secondary | ICD-10-CM | POA: Insufficient documentation

## 2021-03-14 DIAGNOSIS — E1165 Type 2 diabetes mellitus with hyperglycemia: Secondary | ICD-10-CM | POA: Diagnosis not present

## 2021-03-14 DIAGNOSIS — R0902 Hypoxemia: Secondary | ICD-10-CM | POA: Diagnosis not present

## 2021-03-14 DIAGNOSIS — F0281 Dementia in other diseases classified elsewhere with behavioral disturbance: Secondary | ICD-10-CM

## 2021-03-14 DIAGNOSIS — I10 Essential (primary) hypertension: Secondary | ICD-10-CM | POA: Diagnosis not present

## 2021-03-14 DIAGNOSIS — I517 Cardiomegaly: Secondary | ICD-10-CM | POA: Diagnosis not present

## 2021-03-14 DIAGNOSIS — Z743 Need for continuous supervision: Secondary | ICD-10-CM | POA: Diagnosis not present

## 2021-03-14 DIAGNOSIS — R69 Illness, unspecified: Secondary | ICD-10-CM | POA: Diagnosis not present

## 2021-03-14 LAB — CBC WITH DIFFERENTIAL/PLATELET
Abs Immature Granulocytes: 0.01 10*3/uL (ref 0.00–0.07)
Basophils Absolute: 0 10*3/uL (ref 0.0–0.1)
Basophils Relative: 0 %
Eosinophils Absolute: 0.1 10*3/uL (ref 0.0–0.5)
Eosinophils Relative: 2 %
HCT: 42.9 % (ref 36.0–46.0)
Hemoglobin: 13.5 g/dL (ref 12.0–15.0)
Immature Granulocytes: 0 %
Lymphocytes Relative: 29 %
Lymphs Abs: 1.3 10*3/uL (ref 0.7–4.0)
MCH: 26.6 pg (ref 26.0–34.0)
MCHC: 31.5 g/dL (ref 30.0–36.0)
MCV: 84.4 fL (ref 80.0–100.0)
Monocytes Absolute: 0.6 10*3/uL (ref 0.1–1.0)
Monocytes Relative: 13 %
Neutro Abs: 2.5 10*3/uL (ref 1.7–7.7)
Neutrophils Relative %: 56 %
RBC: 5.08 MIL/uL (ref 3.87–5.11)
RDW: 17.2 % — ABNORMAL HIGH (ref 11.5–15.5)
WBC: 4.5 10*3/uL (ref 4.0–10.5)
nRBC: 0 % (ref 0.0–0.2)

## 2021-03-14 LAB — URINALYSIS, COMPLETE (UACMP) WITH MICROSCOPIC
Bilirubin Urine: NEGATIVE
Glucose, UA: 50 mg/dL — AB
Hgb urine dipstick: NEGATIVE
Ketones, ur: NEGATIVE mg/dL
Nitrite: NEGATIVE
Protein, ur: NEGATIVE mg/dL
Specific Gravity, Urine: 1.006 (ref 1.005–1.030)
pH: 7 (ref 5.0–8.0)

## 2021-03-14 LAB — COMPREHENSIVE METABOLIC PANEL
ALT: 13 U/L (ref 0–44)
AST: 24 U/L (ref 15–41)
Albumin: 3.5 g/dL (ref 3.5–5.0)
Alkaline Phosphatase: 62 U/L (ref 38–126)
Anion gap: 12 (ref 5–15)
BUN: 8 mg/dL (ref 8–23)
CO2: 22 mmol/L (ref 22–32)
Calcium: 8.9 mg/dL (ref 8.9–10.3)
Chloride: 108 mmol/L (ref 98–111)
Creatinine, Ser: 1.38 mg/dL — ABNORMAL HIGH (ref 0.44–1.00)
GFR, Estimated: 37 mL/min — ABNORMAL LOW (ref >60)
Glucose, Bld: 161 mg/dL — ABNORMAL HIGH (ref 70–99)
Potassium: 3.3 mmol/L — ABNORMAL LOW (ref 3.5–5.1)
Sodium: 142 mmol/L (ref 135–145)
Total Bilirubin: 0.6 mg/dL (ref 0.3–1.2)
Total Protein: 6.8 g/dL (ref 6.5–8.1)

## 2021-03-14 LAB — RESP PANEL BY RT-PCR (FLU A&B, COVID) ARPGX2
Influenza A by PCR: NEGATIVE
Influenza B by PCR: NEGATIVE
SARS Coronavirus 2 by RT PCR: NEGATIVE

## 2021-03-14 MED ORDER — PREGABALIN 25 MG PO CAPS
75.0000 mg | ORAL_CAPSULE | Freq: Two times a day (BID) | ORAL | Status: DC
  Administered 2021-03-14 – 2021-03-15 (×3): 75 mg via ORAL
  Filled 2021-03-14 (×4): qty 3

## 2021-03-14 MED ORDER — RIVAROXABAN 15 MG PO TABS
15.0000 mg | ORAL_TABLET | Freq: Every day | ORAL | Status: DC
  Administered 2021-03-14 – 2021-03-15 (×2): 15 mg via ORAL
  Filled 2021-03-14 (×4): qty 1

## 2021-03-14 MED ORDER — LORAZEPAM 2 MG/ML IJ SOLN
1.0000 mg | Freq: Once | INTRAMUSCULAR | Status: AC
  Administered 2021-03-14: 1 mg via INTRAVENOUS
  Filled 2021-03-14: qty 1

## 2021-03-14 MED ORDER — LORAZEPAM 2 MG/ML IJ SOLN
INTRAMUSCULAR | Status: AC
  Administered 2021-03-14: 1 mg via INTRAVENOUS
  Filled 2021-03-14: qty 1

## 2021-03-14 MED ORDER — LIDOCAINE 5 % EX PTCH: 1.0000 | MEDICATED_PATCH | Freq: Every day | CUTANEOUS | Status: DC | PRN

## 2021-03-14 MED ORDER — ACETAMINOPHEN 500 MG PO TABS: 1000.0000 mg | ORAL_TABLET | Freq: Three times a day (TID) | ORAL | Status: DC | PRN

## 2021-03-14 MED ORDER — LORAZEPAM 2 MG/ML IJ SOLN: 1.0000 mg | Freq: Once | INTRAMUSCULAR | Status: AC

## 2021-03-14 MED ORDER — ALBUTEROL SULFATE (2.5 MG/3ML) 0.083% IN NEBU: 2.5000 mg | INHALATION_SOLUTION | Freq: Four times a day (QID) | RESPIRATORY_TRACT | Status: DC | PRN

## 2021-03-14 MED ORDER — SODIUM CHLORIDE 0.9 % IV SOLN: INTRAVENOUS | Status: DC

## 2021-03-14 MED ORDER — ALPRAZOLAM 0.25 MG PO TABS: 0.2500 mg | ORAL_TABLET | Freq: Two times a day (BID) | ORAL | Status: DC | PRN

## 2021-03-14 MED ORDER — LORAZEPAM 2 MG/ML IJ SOLN: 1.0000 mg | Freq: Once | INTRAMUSCULAR | Status: DC

## 2021-03-14 MED ORDER — DONEPEZIL HCL 5 MG PO TABS
5.0000 mg | ORAL_TABLET | Freq: Every day | ORAL | Status: DC
  Administered 2021-03-14 – 2021-03-15 (×2): 5 mg via ORAL
  Filled 2021-03-14 (×3): qty 1

## 2021-03-14 MED ORDER — LOSARTAN POTASSIUM 50 MG PO TABS
50.0000 mg | ORAL_TABLET | Freq: Every day | ORAL | Status: DC
  Administered 2021-03-14 – 2021-03-15 (×2): 50 mg via ORAL
  Filled 2021-03-14 (×2): qty 1

## 2021-03-14 MED ORDER — FUROSEMIDE 20 MG PO TABS
20.0000 mg | ORAL_TABLET | Freq: Every day | ORAL | Status: DC
  Administered 2021-03-14 – 2021-03-15 (×2): 20 mg via ORAL
  Filled 2021-03-14 (×2): qty 1

## 2021-03-14 MED ORDER — HYDROCHLOROTHIAZIDE 12.5 MG PO CAPS
12.5000 mg | ORAL_CAPSULE | Freq: Every day | ORAL | Status: DC
  Administered 2021-03-14 – 2021-03-15 (×2): 12.5 mg via ORAL
  Filled 2021-03-14 (×2): qty 1

## 2021-03-14 MED ORDER — SODIUM CHLORIDE 0.9 % IV BOLUS
1000.0000 mL | Freq: Once | INTRAVENOUS | Status: AC
  Administered 2021-03-14: 1000 mL via INTRAVENOUS

## 2021-03-14 MED ORDER — LORATADINE 10 MG PO TABS
10.0000 mg | ORAL_TABLET | Freq: Every day | ORAL | Status: DC
  Administered 2021-03-14 – 2021-03-15 (×2): 10 mg via ORAL
  Filled 2021-03-14 (×2): qty 1

## 2021-03-14 MED ORDER — MIRTAZAPINE 30 MG PO TABS
45.0000 mg | ORAL_TABLET | Freq: Every day | ORAL | Status: DC
  Administered 2021-03-14 – 2021-03-15 (×2): 45 mg via ORAL
  Filled 2021-03-14 (×3): qty 1

## 2021-03-14 MED ORDER — FAMOTIDINE 20 MG PO TABS: 20.0000 mg | ORAL_TABLET | Freq: Two times a day (BID) | ORAL | Status: DC | PRN

## 2021-03-14 MED ORDER — HALOPERIDOL LACTATE 5 MG/ML IJ SOLN
2.0000 mg | Freq: Once | INTRAMUSCULAR | Status: AC
  Administered 2021-03-14: 2 mg via INTRAVENOUS
  Filled 2021-03-14: qty 1

## 2021-03-14 NOTE — ED Notes (Signed)
Pacemaker interrogated. 

## 2021-03-14 NOTE — ED Notes (Signed)
Secure message sent to pharmacy regarding missing xarelto medication.
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
## 2021-03-14 NOTE — ED Notes (Signed)
Sitter at bedside.
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
## 2021-03-14 NOTE — ED Notes (Signed)
Received report for patient from previous RN. Pt continues to be complaint with care. However is confused and delusional believing there is someone in the room. Request for sitter placed. Informed pt if cooperation continues and sitter at bedside will be able to remove soft restraints. Pt verbalized understanding.
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
## 2021-03-14 NOTE — ED Notes (Signed)
Patient refused CT and portable xray.

## 2021-03-14 NOTE — ED Notes (Signed)
Pt continues to be calm and cooperative with staff. Soft restraints have been discontinued

## 2021-03-14 NOTE — ED Triage Notes (Signed)
Patient arrives via [facility] for behavioral.  EMS states that family said the patient is non compliant with medications.   BP: 180/110 HR: 90 CBG: 285

## 2021-03-14 NOTE — ED Notes (Signed)
Patient completed CT

## 2021-03-14 NOTE — ED Provider Notes (Signed)
[facility] EMERGENCY DEPARTMENT Provider Note   CSN: [id number] Arrival date & time: 03/14/21  1026     History No chief complaint on file.   Alyssa Lindsey is a 87 y.o. female.  Pt presents to the ED today with increased agitation at home.  The pt has a hx of dementia and lives with her daughter.  For the last 2 weeks, pt has been more combative.  She has been refusing her meds.  Her daughter said she has not been eating or drinking much.  Pt's daughter said patient has been hallucinating that the daughter's daughter (granddaughter to the patient) has been throwing rocks at the house and has been stealing things.  Daughter said her daughter has not even been in [place].  The daughter called EMS today when the patient became violent with her.  When the fire department arrived at pt's house, they found her shaking the daughter by the shoulders.  They were able to get patient off the daughter.  When EMS arrived, she was calm, but did not want to come to the ED.  She became agitated and initially refused transport.  The daughter is the POA, so EMS gave pt 5 mg of versed IM en route to get her to the ED.  Pt denies anything is wrong.  She is mad she is here.  She said if "I could kill that slut, I'd be fine."        Past Medical History:  Diagnosis Date  . Arthritis   . Atrial fibrillation (HCC)   . Chronic neck pain   . Diverticulosis of colon (without mention of hemorrhage)   . Family history of malignant neoplasm of gastrointestinal tract   . Gastritis   . GERD (gastroesophageal reflux disease)   . Hiatal hernia   . HTN (hypertension)   . LBP (low back pain)   . Memory problem   . Spondylosis   . Thrombocytopenia (HCC)   . Type II or unspecified type diabetes mellitus without mention of complication, not stated as uncontrolled     Patient Active Problem List   Diagnosis Date Noted  . Urinary frequency 01/12/2021  . Generalized weakness 01/12/2021  .  Dehydration 01/12/2021  . Symptomatic bradycardia 12/15/2020  . Myalgia 10/25/2020  . Stage 3a chronic kidney disease (HCC) 09/18/2020  . Leg swelling 09/18/2020  . Sleep disturbance 09/06/2020  . Chronic atrial fibrillation (HCC) 04/20/2020  . Thrombocytopenia (HCC) 04/18/2020  . Blood in sputum 04/18/2020  . Educated about COVID-19 virus infection 04/12/2020  . DOE (dyspnea on exertion) 04/12/2020  . Permanent atrial fibrillation (HCC) 04/12/2020  . PHN (postherpetic neuralgia) 12/02/2019  . Breast pain, left 10/06/2019  . Constipation 11/18/2017  . Adjustment disorder with mixed anxiety and depressed mood 07/06/2016  . Low back pain 05/09/2016  . Numbness and tingling of both legs 04/25/2016  . Bilateral leg edema 04/11/2016  . Routine general medical examination at a health care facility 12/26/2015  . GERD (gastroesophageal reflux disease) 03/02/2015  . Right knee pain 03/04/2014  . Chronic LLQ pain 02/07/2012  . B12 deficiency 03/06/2010  . 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 THYROID NODULE 03/03/2008  . BREAST MASS 03/03/2008  . Diabetes type 2, controlled (HCC) 06/21/2007  . Essential hypertension 06/21/2007    Past Surgical History:  Procedure Laterality Date  . COLONOSCOPY  2010   NORMAL-- DUE 2020  . PACEMAKER IMPLANT N/A 12/16/2020   Procedure: PACEMAKER IMPLANT;  Surgeon: Ladona Ridgel,  Doylene Canning, MD;  Location: [facility] INVASIVE CV LAB;  Service: Cardiovascular;  Laterality: N/A;  . TUBAL LIGATION       OB History   No obstetric history on file.     Family History  Problem Relation Age of Onset  . Emphysema Brother   . Stroke Father 80  . Ovarian cancer Sister   . Breast cancer Sister   . Liver disease Brother   . Colon cancer Daughter   . Diabetes Daughter   . Throat cancer Brother   . Lung cancer Daughter     Social History   Tobacco Use  . Smoking status: Never Smoker  . Smokeless tobacco: Never Used  Substance Use Topics  . Alcohol use: No     Alcohol/week: 0.0 standard drinks  . Drug use: No    Home Medications Prior to Admission medications   Medication Sig Start Date End Date Taking? Authorizing Provider  acetaminophen (TYLENOL) 650 MG CR tablet Take 1,300 mg by mouth every 8 (eight) hours as needed for pain.    [provider]  albuterol (PROVENTIL) (2.5 MG/3ML) 0.083% nebulizer solution Take 3 mLs (2.5 mg total) by nebulization every 6 (six) hours as needed for wheezing or shortness of breath. 12/09/20   Myrlene Broker, MD  albuterol (VENTOLIN HFA) 108 (90 Base) MCG/ACT inhaler Inhale 2 puffs into the lungs every 8 (eight) hours. Patient taking differently: Inhale 2 puffs into the lungs every 8 (eight) hours as needed for wheezing or shortness of breath. 11/08/20   Rollene Rotunda, MD  ALPRAZolam Prudy Feeler) 0.25 MG tablet Take 1 tablet (0.25 mg total) by mouth 2 (two) times daily as needed for anxiety. 03/03/21   Myrlene Broker, MD  amLODipine (NORVASC) 5 MG tablet Take 1 tablet (5 mg total) by mouth daily. 11/08/20 02/06/21  Rollene Rotunda, MD  Capsaicin 0.075 % STCK .075% topically twice per day 11/22/20   Marcello Fennel, MD  cetirizine (ZYRTEC) 10 MG tablet TAKE ONE TABLET BY MOUTH ONCE DAILY Patient taking differently: Take 10 mg by mouth daily. 10/25/16   Myrlene Broker, MD  donepezil (ARICEPT) 5 MG tablet Take 1 tablet (5 mg total) by mouth at bedtime. 03/06/21   Myrlene Broker, MD  famotidine (PEPCID) 20 MG tablet Take 1 tablet (20 mg total) by mouth 2 (two) times daily. Patient taking differently: Take 20 mg by mouth 2 (two) times daily as needed (stomach pain). 09/18/19   Myrlene Broker, MD  furosemide (LASIX) 20 MG tablet Take 1 tablet (20 mg total) by mouth as needed for edema. Patient taking differently: Take 20 mg by mouth daily. 02/25/20 09/27/21  Azalee Course, PA  hydrochlorothiazide (MICROZIDE) 12.5 MG capsule Take 12.5 mg by mouth daily.    [provider]   lidocaine (LIDODERM) 5 % Place 2 patches onto the skin daily. Remove & Discard patch within 12 hours or as directed by MD Patient taking differently: Place 1-2 patches onto the skin daily as needed (pain). Remove & Discard patch within 12 hours or as directed by MD 09/06/20   Marcello Fennel, MD  losartan (COZAAR) 50 MG tablet Take 1 tablet by mouth once daily 01/02/21   Jodelle Gross, NP  mirtazapine (REMERON) 45 MG tablet Take 1 tablet (45 mg total) by mouth at bedtime. 03/06/21   Myrlene Broker, MD  NONFORMULARY OR COMPOUNDED ITEM Ketamine 10%, Baclofen 2%, Cyclobenzaprine 2%, Ketoprofen 10%, Gabapentin 6%, Lidocaine 1%, Amitryptiline 5% Patient taking differently:  Apply 1 application topically daily as needed (pain). Ketamine 10%, Baclofen 2%, Cyclobenzaprine 2%, Ketoprofen 10%, Gabapentin 6%, Lidocaine 1%, Amitryptiline 5% ([facility]) 10/25/20   Marcello FennelPatel, Ankit Anil, MD  pregabalin (LYRICA) 75 MG capsule Take 1 capsule by mouth twice daily 12/21/20   Marcello FennelPatel, Ankit Anil, MD  umeclidinium-vilanterol (ANORO ELLIPTA) 62.5-25 MCG/INH AEPB Inhale 1 puff into the lungs daily. 11/10/20   Myrlene Brokerrawford, Elizabeth A, MD  XARELTO 15 MG TABS tablet TAKE 1 TABLET BY MOUTH ONCE DAILY WITH  SUPPER 01/02/21   Jodelle GrossLawrence, Kathryn M, NP    Allergies    Dicyclomine hcl, Lisinopril, and Aspirin  Review of Systems   Review of Systems  Unable to perform ROS: Dementia    Physical Exam Updated Vital Signs BP (!) 173/97 (BP Location: Right Arm)   Pulse 61   Temp 98.2 F (36.8 C) (Oral)   Resp 16   SpO2 99%   Physical Exam Vitals and nursing note reviewed.  Constitutional:      Appearance: Normal appearance.  HENT:     Head: Normocephalic and atraumatic.     Right Ear: External ear normal.     Left Ear: External ear normal.     Nose: Nose normal.     Mouth/Throat:     Mouth: Mucous membranes are moist.     Pharynx: Oropharynx is clear.  Eyes:     Extraocular Movements:  Extraocular movements intact.     Conjunctiva/sclera: Conjunctivae normal.     Pupils: Pupils are equal, round, and reactive to light.  Cardiovascular:     Rate and Rhythm: Normal rate and regular rhythm.     Pulses: Normal pulses.     Heart sounds: Normal heart sounds.  Pulmonary:     Effort: Pulmonary effort is normal.     Breath sounds: Normal breath sounds.  Abdominal:     General: Abdomen is flat. Bowel sounds are normal.     Palpations: Abdomen is soft.  Musculoskeletal:        General: Normal range of motion.     Cervical back: Normal range of motion and neck supple.  Skin:    General: Skin is warm.     Capillary Refill: Capillary refill takes less than 2 seconds.  Neurological:     Mental Status: She is alert.     Comments: Knows name and that she's in the ed  Psychiatric:        Mood and Affect: Affect is angry.     Comments: Pt is angry we are hooking her up to the monitor, but she is letting us do what needs to be done.     ED Results / Procedures / Treatments   Labs (all labs ordered are listed, but only abnormal results are displayed) Labs Reviewed  COMPREHENSIVE METABOLIC PANEL - Abnormal; Notable for the following components:      Result Value   Potassium 3.3 (*)    Glucose, Bld 161 (*)    Creatinine, Ser 1.38 (*)    GFR, Estimated 37 (*)    All other components within normal limits  CBC WITH DIFFERENTIAL/PLATELET - Abnormal; Notable for the following components:   RDW 17.2 (*)    All other components within normal limits  URINALYSIS, COMPLETE (UACMP) WITH MICROSCOPIC - Abnormal; Notable for the following components:   APPearance HAZY (*)    Glucose, UA 50 (*)    Leukocytes,Ua MODERATE (*)    Bacteria, UA RARE (*)  All other components within normal limits  RESP PANEL BY RT-PCR (FLU A&B, COVID) ARPGX2  CBG MONITORING, ED    EKG EKG Interpretation  Date/Time:  Tuesday March 14 2021 10:32:31 EDT Ventricular Rate:  75 PR Interval:    QRS  Duration: 139 QT Interval:  397 QTC Calculation: 444 R Axis:   -62 Text Interpretation: Atrial fibrillation IVCD, consider atypical RBBB LVH with IVCD, LAD and secondary repol abnrm Inferior infarct, old Anterolateral infarct, age indeterminate VENTRICULAR PACED RHYTHM No significant change since last tracing Confirmed by Jacalyn Lefevre [phone]) on 03/14/2021 11:25:54 AM   Radiology CT HEAD WO CONTRAST  Result Date: 03/14/2021 CLINICAL DATA:  Altered mental status. EXAM: CT HEAD WITHOUT CONTRAST TECHNIQUE: Contiguous axial images were obtained from the base of the skull through the vertex without intravenous contrast. COMPARISON:  Head CT 02/08/2012. FINDINGS: Brain: No evidence of acute infarction, hemorrhage, hydrocephalus, extra-axial collection or mass lesion/mass effect. Mild appearing cortical atrophy and chronic microvascular ischemic change noted. Vascular: No hyperdense vessel or unexpected calcification. Skull: Normal. Negative for fracture or focal lesion. Sinuses/Orbits: Negative. Other: None. IMPRESSION: No acute abnormality. Mild atrophy and chronic microvascular ischemic change. Electronically Signed   By: Drusilla Kanner M.D.   On: 03/14/2021 13:18   DG Chest Portable 1 View  Result Date: 03/14/2021 CLINICAL DATA:  Altered mental status. EXAM: PORTABLE CHEST 1 VIEW COMPARISON:  12/17/2020. FINDINGS: Cardiac pacer with lead tip over the ventricle. Cardiomegaly. Minimal bilateral interstitial prominence. Very mild component of interstitial edema cannot be excluded. Small left pleural effusion cannot be excluded. No pneumothorax. IMPRESSION: Cardiac pacer with lead tip over the right ventricle. Cardiomegaly. Minimal bilateral interstitial prominence. Small left pleural effusion cannot be excluded. Mild CHF cannot be excluded. Electronically Signed   By: Maisie Fus  Register   On: 03/14/2021 12:39    Procedures Procedures   Medications Ordered in ED Medications  sodium chloride 0.9 %  bolus 1,000 mL (1,000 mLs Intravenous New Bag/Given 03/14/21 1055)    And  0.9 %  sodium chloride infusion (has no administration in time range)  LORazepam (ATIVAN) injection 1 mg (has no administration in time range)  acetaminophen (TYLENOL) CR tablet 1,300 mg (has no administration in time range)  albuterol (PROVENTIL) (2.5 MG/3ML) 0.083% nebulizer solution 2.5 mg (has no administration in time range)  ALPRAZolam (XANAX) tablet 0.25 mg (has no administration in time range)  loratadine (CLARITIN) tablet 10 mg (has no administration in time range)  donepezil (ARICEPT) tablet 5 mg (has no administration in time range)  famotidine (PEPCID) tablet 20 mg (has no administration in time range)  furosemide (LASIX) tablet 20 mg (has no administration in time range)  hydrochlorothiazide (MICROZIDE) capsule 12.5 mg (has no administration in time range)  lidocaine (LIDODERM) 5 % 1-2 patch (has no administration in time range)  losartan (COZAAR) tablet 50 mg (has no administration in time range)  mirtazapine (REMERON) tablet 45 mg (has no administration in time range)  pregabalin (LYRICA) capsule 75 mg (has no administration in time range)  Rivaroxaban (XARELTO) tablet 15 mg (has no administration in time range)  LORazepam (ATIVAN) injection 1 mg (1 mg Intravenous Given 03/14/21 1046)  LORazepam (ATIVAN) injection 1 mg (1 mg Intravenous Given 03/14/21 1124)  haloperidol lactate (HALDOL) injection 2 mg (2 mg Intravenous Given 03/14/21 1204)    ED Course  I have reviewed the triage vital signs and the nursing notes.  Pertinent labs & imaging results that were available during my care of the patient were  reviewed by me and considered in my medical decision making (see chart for details).    MDM Rules/Calculators/A&P                          Pt was initially cooperative, but became more agitated.  She hit and spit on the nurses.  She required additional ativan, then haldol and restraints.    Pt's  medical evaluation is negative for anything acute.  Behavior issues are likely due to dementia.  TTS consult ordered.    Pt's daughter does not feel safe taking her home, so if she is not a candidate for geri-psych, she will need a sw consult.  Final Clinical Impression(s) / ED Diagnoses Final diagnoses:  Alzheimer's dementia with behavioral disturbance, unspecified timing of dementia onset (HCC)  Noncompliance with medications    Rx / DC Orders ED Discharge Orders    None       Jacalyn Lefevre, MD 03/14/21 1529

## 2021-03-14 NOTE — Telephone Encounter (Signed)
We could do referral for palliative or hospice services as that might be more helpful. Would they be open to that?

## 2021-03-15 MED ORDER — QUETIAPINE 12.5 MG HALF TABLET
12.5000 mg | ORAL_TABLET | Freq: Two times a day (BID) | ORAL | Status: DC
  Administered 2021-03-15 (×2): 12.5 mg via ORAL
  Filled 2021-03-15 (×4): qty 1

## 2021-03-15 NOTE — Care Management (Addendum)
ED RNCM noted TOC consult patient does not meet criteria for [facility] services, as per record patient is combative.  HH services as per CMS are provided for restorative and rehabable functioning. This patient does not qualify for [facility] services at this time.  May need to follow up with with PCP for neurology evaluation. Updated RN on [place]  Michel Bickers RN, BSN  ED Care Manager [phone]

## 2021-03-15 NOTE — ED Notes (Signed)
Called [facility] at 20:43. Patient is ninth in line.

## 2021-03-15 NOTE — ED Notes (Signed)
placement

## 2021-03-15 NOTE — BH Assessment (Signed)
Per Assunta Found, NP patient does not meet inpatient criteria and is psych cleared.  Seroquel is recommended at 12.5 mg BID. Alyssa Lindsey has recommended that SW discuss home health vs SNF options with patient's family.

## 2021-03-15 NOTE — Social Work (Signed)
CSW received consult to call family and inform them that Pt had been psych cleared.  CSW spoke with daughter [name] who reports that she had just called ED and had reached RN floor nurse. CSW explained to daughter that Pt does not meet the criteria for SNF or HH. CSW and daughter discussed need to have dementia diagnosis in chart should family wish to seek memory care placement in the future and that such a diagnosis would require a neurology consult. Daughter states that she will follow up with PCP for such a consult.

## 2021-03-15 NOTE — BH Assessment (Signed)
Pt unable to participate in assessment due to being medicated. TTS to follow up when pt arouses.

## 2021-03-15 NOTE — BH Assessment (Signed)
@  10:24 - Contacted pt's RN to set up Telepsych machine for assessment.  Telepsych machine is being used by another provider at this time.  Melissa RN to contact TTS once cart is free.

## 2021-03-15 NOTE — Telephone Encounter (Signed)
Unable to get in contact with the patient's daughter no option to leave a voicemail. I will try to contact her again.

## 2021-03-15 NOTE — BH Assessment (Addendum)
Comprehensive Clinical Assessment (CCA) Screening, Triage and Referral Note  03/15/2021 Alyssa Lindsey [id number]   Disposition: Per Assunta Found, NP patient does not meet inpatient criteria and is psych cleared.  Seroquel is recommended at 12.5 mg BID. [facility] has recommended that SW discuss home health vs SNF options with patient's family.    The patient demonstrates the following risk factors for suicide: Chronic risk factors for suicide include: N/A. Acute risk factors for suicide include: N/A. Protective factors for this patient include: positive social support, responsibility to others (children, family), coping skills and life satisfaction. Considering these factors, the overall suicide risk at this point appears to be low. Patient is appropriate for outpatient follow up.  Patient is an 87 year old female with a history of dementia who presents voluntarily via EMS to [facility] for assessment.  Patient presented via EMS due to altered mental status with agitation, likely related to medication noncompliance and worsening dementia.  Patient lives with her daughter who reports patient has not been eating or drinking much.  She also reports patient has been hallucinating, stating she sees her granddaughter (who doesn't live in town) throwing rocks at the house and stealing things.  Patient's daughter called EMS when patient became combative yesterday.  Patient has refused CT and xray and required soft restraints due to aggression; attempting to hit, kick and spit on ED staff.  Upon assessment, patient appears improved and is quite calm and pleasant.  She appears confused as to the reason she is in the ED.  She states "I must have gotten upset or something."  She also mentions "maybe my breathing issues." She then recalls an argument she had yesterday with her daughter.  She states she had asked her daughter where she was going and her daughter didn't want to tell her.  She states she wanted to go along and  got upset when her daughter wouldn't allow her to.  Within a few minutes, patient retells this story, however states it was "some lady at my house."  She has difficulty providing history and believes she lives alone, "well I think my daughter lives with me."  Discussed concerns noted about patient's granddaughter.  She doesn't recall any issues with her granddaughter, however states she is upset that her daughter "seems to prefer her over me sometimes."  Patient denies any psychiatric history.  She denies SI, HI and AVH.  She states she would like to go home soon.    [name], patient's daughter who lives next door, reports patient has been off of her medications for a couple of weeks. She also reports patient has not been eating or drinking much for the same two week period.  They are also concerned that "she is having hallucinations."  [name] states patient has recently "turned on [place]" patient's granddaughter.  This has been confusing for them, as "it came out of nowhere."  Toni Amend has been staying away from patient recently.   Patient's daughter Jacqualin Combes was unavailable and later called writer back.  She had the same concerns and was hopeful that  patient would be admitted for a couple of weeks to "get stabilized back on her meds.  She needs her medications."  She also confirms patient has not been eating or drinking much lately.  She states she and her sisters will discuss SNF placement, as they are concerned they may not be able to care for their mother much longer.      Flowsheet Row ED from 03/14/2021 in  [facility] EMERGENCY DEPARTMENT ED to Hosp-Admission (Discharged) from 12/15/2020 in [facility]  C-SSRS RISK CATEGORY No Risk No Risk      Chief Complaint: No chief complaint on file.  Visit Diagnosis: Major Neurocognitive Disorder   Patient Reported Information How did you hear about [place]? Other (Comment)   Referral name: Patient presented via EMS  due to ongoing concerns for patient being off of medications for the past two weeks.  She has displayed worsening confusion, agitation and what family believe to be "hallucinations."  They are hoping patient will be admitted to the hospital for a couple of weeks to "get back on her medications."  Patient is pleasantly confused and has difficulty with recall.  She believes she is in the hospital for medical issues and would like to return home.   Referral phone number: No data recorded Whom do you see for routine medical problems? I don't have a doctor   Practice/Facility Name: No data recorded  Practice/Facility Phone Number: No data recorded  Name of Contact: No data recorded  Contact Number: No data recorded  Contact Fax Number: No data recorded  Prescriber Name: No data recorded  Prescriber Address (if known): No data recorded What Is the Reason for Your Visit/Call Today? Patient unclear as to the reason for her ED visit.  How Long Has This Been Causing You Problems? 1 wk - 1 month  Have You Recently Been in Any Inpatient Treatment (Hospital/Detox/Crisis Center/28-Day Program)? No   Name/Location of Program/Hospital:No data recorded  How Long Were You There? No data recorded  When Were You Discharged? No data recorded Have You Ever Received Services From [facility] Before? No   Who Do You See at [facility]? No data recorded Have You Recently Had Any Thoughts About Hurting Yourself? No   Are You Planning to Commit Suicide/Harm Yourself At This time?  No  Have you Recently Had Thoughts About Hurting Someone Karolee Ohs? No   Explanation: No data recorded Have You Used Any Alcohol or Drugs in the Past 24 Hours? No   How Long Ago Did You Use Drugs or Alcohol?  No data recorded  What Did You Use and How Much? No data recorded What Do You Feel Would Help You the Most Today? Medication(s)  Do You Currently Have a Therapist/Psychiatrist? No   Name of Therapist/Psychiatrist: No data  recorded  Have You Been Recently Discharged From Any Office Practice or Programs? No   Explanation of Discharge From Practice/Program:  No data recorded    CCA Screening Triage Referral Assessment Type of Contact: Tele-Assessment   Is this Initial or Reassessment? Initial Assessment   Date Telepsych consult ordered in [facility]:  03/14/2021   Time Telepsych consult ordered in [facility]:  1539  Patient Reported Information Reviewed? Yes   Patient Left Without Being Seen? No data recorded  Reason for Not Completing Assessment: No data recorded Collateral Involvement: Collateral provided by patient's daughters.  Does Patient Have a Automotive engineer Guardian? No data recorded  Name and Contact of Legal Guardian:  No data recorded If Minor and Not Living with Parent(s), Who has Custody? No data recorded Is CPS involved or ever been involved? Never  Is APS involved or ever been involved? Never  Patient Determined To Be At Risk for Harm To Self or Others Based on Review of Patient Reported Information or Presenting Complaint? No   Method: No data recorded  Availability of Means: No data recorded  Intent: No data recorded  Notification Required: No data recorded  Additional Information for Danger to Others Potential:  No data recorded  Additional Comments for Danger to Others Potential:  No data recorded  Are There Guns or Other Weapons in Your Home?  No data recorded   Types of Guns/Weapons: No data recorded   Are These Weapons Safely Secured?                              No data recorded   Who Could Verify You Are Able To Have These Secured:    No data recorded Do You Have any Outstanding Charges, Pending Court Dates, Parole/Probation? No data recorded Contacted To Inform of Risk of Harm To Self or Others: No data recorded Location of Assessment: [facility] ED  Does Patient Present under Involuntary Commitment? No   IVC Papers Initial File Date: No data recorded  [place] of Residence:  [place]  Patient Currently Receiving the Following Services: Not Receiving Services   Determination of Need: Routine (7 days)   Options For Referral: Medication Management   Yetta Glassman, [facility]

## 2021-03-15 NOTE — ED Notes (Signed)
[facility] Psych.

## 2021-03-15 NOTE — ED Notes (Signed)
Pt continues to sleep. V/S WNL. Sitter present.

## 2021-03-15 NOTE — ED Provider Notes (Signed)
87 year old female with dementia and worsening agitation who presented to the emergency department yesterday.  Daughter reports that she has been hallucinating.  Seen by ED provider and felt to be medically stable with altered behavior.  This was thought to be secondary to dementia.  TTS consult was ordered.   Physical Exam  BP (!) 148/79   Pulse (!) 59   Temp 98.2 F (36.8 C) (Oral)   Resp 16   SpO2 96%   Physical Exam  ED Course/Procedures     Procedures  MDM  Patient awaiting TTS since evaluation yesterday.  They attempted to evaluate her at 0043 and stated that she was medicated and they could not evaluate. Urine with wbc and epithelial cells-likely contaminate. Patient pending behavioral health evaluation      Margarita Grizzle, MD 03/15/21 [phone]

## 2021-03-15 NOTE — ED Notes (Signed)
Pt's daughter's have been updated on pt's status and plan of care. Pt's daughters have been updated that pt has been cleared to go home. Pt needs to follow up with PCP.

## 2021-03-16 DIAGNOSIS — Z7401 Bed confinement status: Secondary | ICD-10-CM | POA: Diagnosis not present

## 2021-03-16 DIAGNOSIS — M2550 Pain in unspecified joint: Secondary | ICD-10-CM | POA: Diagnosis not present

## 2021-03-16 LAB — URINE CULTURE
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
## 2021-03-17 ENCOUNTER — Encounter: Payer: Self-pay | Admitting: Internal Medicine

## 2021-03-17 ENCOUNTER — Other Ambulatory Visit: Payer: Self-pay

## 2021-03-17 ENCOUNTER — Ambulatory Visit (INDEPENDENT_AMBULATORY_CARE_PROVIDER_SITE_OTHER): Payer: Medicare HMO | Admitting: Internal Medicine

## 2021-03-17 VITALS — BP 118/78 | HR 79 | Temp 97.9°F | Resp 18 | Ht 60.0 in | Wt 157.4 lb

## 2021-03-17 DIAGNOSIS — F0281 Dementia in other diseases classified elsewhere with behavioral disturbance: Secondary | ICD-10-CM

## 2021-03-17 DIAGNOSIS — R69 Illness, unspecified: Secondary | ICD-10-CM | POA: Diagnosis not present

## 2021-03-17 DIAGNOSIS — G301 Alzheimer's disease with late onset: Secondary | ICD-10-CM | POA: Diagnosis not present

## 2021-03-17 MED ORDER — HALOPERIDOL 0.5 MG PO TABS: 0.5000 mg | ORAL_TABLET | Freq: Three times a day (TID) | ORAL | 0 refills | Status: DC | PRN

## 2021-03-17 NOTE — Patient Instructions (Addendum)
We have sent in haldol to use to help her calm down which you can use up to 3 times a day.   If she is not able to calm down and is a danger to herself or others we can move forward with working on another living situation.   She does have dementia and likely caused from alzheimer's disease. This seems to be in the late stages. The emergency department checked her for infections or other things that might be reversible which they did not find any. Usually at this late stage memory changes can happen quickly and change quickly.    We will get you in with the neurologist.   Dementia Caregiver Guide Dementia is a term used to describe a number of symptoms that affect memory and thinking. The most common symptoms include:  Memory loss.  Trouble with language and communication.  Trouble concentrating.  Poor judgment and problems with reasoning.  Wandering from home or public places.  Extreme anxiety or depression.  Being suspicious or having angry outbursts and accusations.  Child-like behavior and language. Dementia can be frightening and confusing. And taking care of someone with dementia can be challenging. This guide provides tips to help you when providing care for a person with dementia. How to help manage lifestyle changes Dementia usually gets worse slowly over time. In the early stages, people with dementia can stay independent and safe with some help. In later stages, they need help with daily tasks such as dressing, grooming, and using the bathroom. There are actions you can take to help a person manage his or her life while living with this condition. Communicating  When the person is talking or seems frustrated, make eye contact and hold the person's hand.  Ask specific questions that need yes or no answers.  Use simple words, short sentences, and a calm voice. Only give one direction at a time.  When offering choices, limit the person to just one or two.  Avoid  correcting the person in a negative way.  If the person is struggling to find the right words, gently try to help him or her. Preventing injury  Keep floors clear of clutter. Remove rugs, magazine racks, and floor lamps.  Keep hallways well lit, especially at night.  Put a handrail and nonslip mat in the bathtub or shower.  Put childproof locks on cabinets that contain dangerous items, such as medicines, alcohol, guns, toxic cleaning items, sharp tools or utensils, matches, and lighters.  For doors to the outside of the house, put the locks in places where the person cannot see or reach them easily. This will help ensure that the person does not wander out of the house and get lost.  Be prepared for emergencies. Keep a list of emergency phone numbers and addresses in a convenient area.  Remove car keys and lock garage doors so that the person does not try to get in the car and drive.  Have the person wear a bracelet that tracks locations and identifies the person as having memory problems. This should be worn at all times for safety.   Helping with daily life  Keep the person on track with his or her routine.  Try to identify areas where the person may need help.  Be supportive, patient, calm, and encouraging.  Gently remind the person that adjusting to changes takes time.  Help with the tasks that the person has asked for help with.  Keep the person involved in daily tasks  and decisions as much as possible.  Encourage conversation, but try not to get frustrated if the person struggles to find words or does not seem to appreciate your help.   How to recognize stress Look for signs of stress in yourself and in the person you are caring for. If you notice signs of stress, take steps to manage it. Symptoms of stress include:  Feeling anxious, irritable, frustrated, or angry.  Denying that the person has dementia or that his or her symptoms will not improve.  Feeling depressed,  hopeless, or unappreciated.  Difficulty sleeping.  Difficulty concentrating.  Developing stress-related health problems.  Feeling like you have too little time for your own life. Follow these instructions at home: Take care of your health Make sure that you and the person you are caring for:  Get regular sleep.  Exercise regularly.  Eat regular, nutritious meals.  Take over-the-counter and prescription medicines only as told by your health care providers.  Drink enough fluid to keep your urine pale yellow.  Attend all scheduled health care appointments.   General instructions  Join a support group with others who are caregivers.  Ask about respite care resources. Respite care can provide short-term care for the person so that you can have a regular break from the stress of caregiving.  Consider any safety risks and take steps to avoid them.  Organize medicines in a pill box for each day of the week.  Create a plan to handle any legal or financial matters. Get legal or financial advice if needed.  Keep a calendar in a central location to remind the person of appointments or other activities. Where to find support: Many individuals and organizations offer support. These include:  Support groups for people with dementia.  Support groups for caregivers.  Counselors or therapists.  Home health care services.  Adult day care centers. Where to find more information  Centers for Disease Control and Prevention: FootballExhibition.com.br  Alzheimer's Association: LimitLaws.hu  Family Caregiver Alliance: www.caregiver.org  Alzheimer's Foundation of [place]: www.alzfdn.org Contact a health care provider if:  The person's health is rapidly getting worse.  You are no longer able to care for the person.  Caring for the person is affecting your physical and emotional health.  You are feeling depressed or anxious about caring for the person. Get help right away if:  The person  threatens himself or herself, you, or anyone else.  You feel depressed or sad, or feel that you want to harm yourself. If you ever feel like your loved one may hurt himself or herself or others, or if he or she shares thoughts about taking his or her own life, get help right away. You can go to your nearest emergency department or:  Call your local emergency services (911 in the [place]  Call a suicide crisis helpline, such as the National Suicide Prevention Lifeline at 971 019 5711. This is open 24 hours a day in the [place]  Text the Crisis Text Line at (928) 727-7216 (in the [place] Summary  Dementia is a term used to describe a number of symptoms that affect memory and thinking.  Dementia usually gets worse slowly over time.  Take steps to reduce the person's risk of injury and to plan for future care.  Caregivers need support, relief from caregiving, and time for their own lives. This information is not intended to replace advice given to you by your health care provider. Make sure you discuss any questions you have with your health  care provider. Document Revised: 03/28/2020 Document Reviewed: 03/28/2020 Elsevier Patient Education  2021 [employer].

## 2021-03-17 NOTE — Telephone Encounter (Signed)
Patient is scheduled to be seen in office by Dr. Okey Dupre.
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
## 2021-03-20 ENCOUNTER — Telehealth: Payer: Self-pay | Admitting: Internal Medicine

## 2021-03-20 ENCOUNTER — Encounter: Payer: Self-pay | Admitting: Internal Medicine

## 2021-03-20 DIAGNOSIS — F0281 Dementia in other diseases classified elsewhere with behavioral disturbance: Secondary | ICD-10-CM

## 2021-03-20 DIAGNOSIS — G301 Alzheimer's disease with late onset: Secondary | ICD-10-CM

## 2021-03-20 NOTE — Telephone Encounter (Signed)
Patient daughter [name] calling to report patient will not take her medication and is not eating. Seeking advise  Requesting referral for palliative care

## 2021-03-20 NOTE — Telephone Encounter (Signed)
Daughter notified via [place] message as well as a detailed voicemail.

## 2021-03-20 NOTE — Telephone Encounter (Signed)
See below

## 2021-03-20 NOTE — Telephone Encounter (Signed)
Referral to hospice placed, will be attending. Try to encourage fluids to patient. If not eating or drinking fluids at all family will need to consider taking her back to ER for fluids if this is wanted for patient. If not desiring that then we can try to keep her comfortable the body will only work for so long without nutrition.

## 2021-03-22 ENCOUNTER — Ambulatory Visit (INDEPENDENT_AMBULATORY_CARE_PROVIDER_SITE_OTHER): Payer: Medicare Other

## 2021-03-22 DIAGNOSIS — I442 Atrioventricular block, complete: Secondary | ICD-10-CM | POA: Diagnosis not present

## 2021-03-22 LAB — CUP PACEART REMOTE DEVICE CHECK
Battery Remaining Longevity: 153 mo
Battery Voltage: 3.18 V
Brady Statistic RV Percent Paced: 99.91 %
Date Time Interrogation Session: 20220427021625
Implantable Lead Implant Date: 20220121
Implantable Lead Location: 753860
Implantable Lead Model: 3830
Implantable Pulse Generator Implant Date: 20220121
Lead Channel Impedance Value: 418 Ohm
Lead Channel Impedance Value: 608 Ohm
Lead Channel Pacing Threshold Amplitude: 0.75 V
Lead Channel Pacing Threshold Pulse Width: 0.4 ms
Lead Channel Sensing Intrinsic Amplitude: 14.875 mV
Lead Channel Setting Pacing Amplitude: 3.5 V
Lead Channel Setting Pacing Pulse Width: 0.4 ms
Lead Channel Setting Sensing Sensitivity: 0.9 mV

## 2021-03-28 ENCOUNTER — Other Ambulatory Visit: Payer: Self-pay

## 2021-03-28 ENCOUNTER — Other Ambulatory Visit: Payer: Self-pay | Admitting: *Deleted

## 2021-03-28 NOTE — Patient Outreach (Signed)
[facility]) Care Management  03/28/2021  Alyssa Lindsey 24-Jul-1929 [id number]  [facility] outreach to complex care patient referred by [facility]  Alyssa Lindsey was referred to [facility] on 12/19/20 for  RED ON EMMI ALERT Day #1 Date: Monday 12/19/20 1316 patient Red Alert Reason:wounds healing well? No  EMMI resolved per daughter on 03/14/21 Wound healed but with some questionable scratch marks per daughter   Insurance:[employer] [facility] x2ED visits x2 in the last 6 months  Last admission  03/14/21 Behavioral concerns 12/15/20-12/17/20 systemic bradycardia s/pMedtronicsingle chamber pace maker implantation ,Diabetes (DM) type 2Hypertension (HTN)GERD (gastroesophageal reflux disease)Atrial fibrillationwith slow ventricular rate [facility]  [facility] follow up outreachassessment Discharged home on 03/14/21 from ED  Had office follow with primary care provider (PCP), Hillard Danker, MD  Alzheimer/dementia symptoms- behavioral  Started on Haldol on Monday 03/20/21  Has hospice palliative services to assist with medication administration Now taking medicines and is more appropriate Daughter had a call from neurology but has not scheduled appointment yet No falls   Nutrition Eating foods she likes daily appetite improving   Diabetes  not taking home cbg values per MD  last HgA1c 6.7 on 06/27/20 Continues to be managed well with diet   Hypertension 130/80 124/?  Past Medical History:  Diagnosis Date  . Arthritis   . Atrial fibrillation (HCC)   . Chronic neck pain   . Diverticulosis of colon (without mention of hemorrhage)   . Family history of malignant neoplasm of gastrointestinal tract   . Gastritis   . GERD (gastroesophageal reflux disease)   . Hiatal hernia   . HTN (hypertension)   . LBP (low back pain)   . Memory problem   . Spondylosis   . Thrombocytopenia (HCC)   . Type II or unspecified type diabetes mellitus without  mention of complication, not stated as uncontrolled    Current Outpatient Medications on File Prior to Visit  Medication Sig Dispense Refill  . acetaminophen (TYLENOL) 650 MG CR tablet Take 1,300 mg by mouth every 8 (eight) hours as needed for pain.    [name] albuterol (PROVENTIL) (2.5 MG/3ML) 0.083% nebulizer solution Take 3 mLs (2.5 mg total) by nebulization every 6 (six) hours as needed for wheezing or shortness of breath. 150 mL 1  . albuterol (VENTOLIN HFA) 108 (90 Base) MCG/ACT inhaler Inhale 2 puffs into the lungs every 8 (eight) hours. 8 g 2  . ALPRAZolam (XANAX) 0.25 MG tablet Take 1 tablet (0.25 mg total) by mouth 2 (two) times daily as needed for anxiety. 60 tablet 0  . amLODipine (NORVASC) 5 MG tablet Take 1 tablet (5 mg total) by mouth daily. 90 tablet 3  . Capsaicin 0.075 % STCK .075% topically twice per day 1 g 1  . cetirizine (ZYRTEC) 10 MG tablet TAKE ONE TABLET BY MOUTH ONCE DAILY 30 tablet 11  . donepezil (ARICEPT) 5 MG tablet Take 1 tablet (5 mg total) by mouth at bedtime. 90 tablet 1  . famotidine (PEPCID) 20 MG tablet Take 1 tablet (20 mg total) by mouth 2 (two) times daily. 60 tablet 2  . furosemide (LASIX) 20 MG tablet Take 1 tablet (20 mg total) by mouth as needed for edema. 90 tablet 3  . haloperidol (HALDOL) 0.5 MG tablet Take 1 tablet (0.5 mg total) by mouth every 8 (eight) hours as needed for agitation. 90 tablet 0  . hydrochlorothiazide (MICROZIDE) 12.5 MG capsule Take 12.5 mg by mouth daily.    [name] lidocaine (  LIDODERM) 5 % Place 2 patches onto the skin daily. Remove & Discard patch within 12 hours or as directed by MD 60 patch 0  . losartan (COZAAR) 50 MG tablet Take 1 tablet by mouth once daily 90 tablet 3  . mirtazapine (REMERON) 30 MG tablet Take 30 mg by mouth at bedtime.    . NONFORMULARY OR COMPOUNDED ITEM Ketamine 10%, Baclofen 2%, Cyclobenzaprine 2%, Ketoprofen 10%, Gabapentin 6%, Lidocaine 1%, Amitryptiline 5% 1 each 1  . pregabalin (LYRICA) 75 MG capsule Take 1  capsule by mouth twice daily 60 capsule 0  . umeclidinium-vilanterol (ANORO ELLIPTA) 62.5-25 MCG/INH AEPB Inhale 1 puff into the lungs daily. 30 each 3  . VITAMIN D, CHOLECALCIFEROL, PO Take 1 tablet by mouth daily. gummy    . XARELTO 15 MG TABS tablet TAKE 1 TABLET BY MOUTH ONCE DAILY WITH  SUPPER 60 tablet 5   No current facility-administered medications on file prior to visit.    Plans Patient agrees to care plan and follow up within the next 30 business days Pt encouraged to return a call to [facility] RN CM prn Goals Addressed              This Visit's Progress     Patient Stated   .  [facility]) Eat Healthy (pt-stated)   On track     Timeframe:  Short-Term Goal Priority:  High Start Date:       01/24/21                      Expected End Date:    04/25/21                   Follow Up Date 04/28/21    - set a realistic goal  Increase fluid intake 2-4 glasses or more as tolerated Take ensure if a meal is missed    Notes:   03/28/21 appetite improving Eating what she likes Has home hospice/palliative care services Now taking in medications better that is assisting with improved behavior 03/06/21 outreach to [facility] RN CM with return call, not eating, drinking or taking pills well per daughter 02/28/21 pt with decrease solid and fluid intake, increased behavioral symptoms For pcp appointment on this week 01/24/21 pt agreed to work on goals to "drink and movePPG Industries L. [name], RN, BSN, CCM [facility] Telephonic Care Management Care Coordinator Office number [phone] Main [facility] number [phone] Fax number [phone]

## 2021-03-29 ENCOUNTER — Encounter: Payer: Self-pay | Admitting: Internal Medicine

## 2021-03-29 ENCOUNTER — Ambulatory Visit: Payer: Medicare HMO | Admitting: Internal Medicine

## 2021-03-29 VITALS — BP 144/80 | HR 84 | Ht 60.0 in | Wt 170.4 lb

## 2021-03-29 DIAGNOSIS — R001 Bradycardia, unspecified: Secondary | ICD-10-CM

## 2021-03-29 DIAGNOSIS — Z950 Presence of cardiac pacemaker: Secondary | ICD-10-CM

## 2021-03-29 NOTE — Patient Instructions (Signed)
Medication Instructions:  Your physician recommends that you continue on your current medications as directed. Please refer to the Current Medication list given to you today.  Labwork: None ordered.  Testing/Procedures: None ordered.  Follow-Up: Your physician wants you to follow-up in: one year with Lewayne Bunting, MD or one of the following Advanced Practice Providers on your designated Care Team:    Gypsy Balsam, NP  Francis Dowse, PA-C  Casimiro Needle "Mardelle Matte" [place], [place]  Remote monitoring is used to monitor your Pacemaker from home. This monitoring reduces the number of office visits required to check your device to one time per year. It allows [place] to keep an eye on the functioning of your device to ensure it is working properly. You are scheduled for a device check from home on 06/21/2021. You may send your transmission at any time that day. If you have a wireless device, the transmission will be sent automatically. After your physician reviews your transmission, you will receive a postcard with your next transmission date.  Any Other Special Instructions Will Be Listed Below (If Applicable).  If you need a refill on your cardiac medications before your next appointment, please call your pharmacy.

## 2021-03-29 NOTE — Progress Notes (Signed)
HPI Alyssa Lindsey returns today for followup. She is a pleasant 87 yo woman with a h/o chronic atrial fib, CHB, s/p PPM, dementia and prior shingles. She c/o back pain. She denies chest pain, sob, or edema. No syncope.  Allergies  Allergen Reactions  . Dicyclomine Hcl Other (See Comments)    Severe eye pain  . Lisinopril Cough  . Aspirin Other (See Comments)    Unknown reaction     Current Outpatient Medications  Medication Sig Dispense Refill  . acetaminophen (TYLENOL) 650 MG CR tablet Take 1,300 mg by mouth every 8 (eight) hours as needed for pain.    [name] albuterol (PROVENTIL) (2.5 MG/3ML) 0.083% nebulizer solution Take 3 mLs (2.5 mg total) by nebulization every 6 (six) hours as needed for wheezing or shortness of breath. 150 mL 1  . albuterol (VENTOLIN HFA) 108 (90 Base) MCG/ACT inhaler Inhale 2 puffs into the lungs every 8 (eight) hours. 8 g 2  . ALPRAZolam (XANAX) 0.25 MG tablet Take 1 tablet (0.25 mg total) by mouth 2 (two) times daily as needed for anxiety. 60 tablet 0  . amLODipine (NORVASC) 5 MG tablet Take 1 tablet (5 mg total) by mouth daily. 90 tablet 3  . Capsaicin 0.075 % STCK .075% topically twice per day 1 g 1  . cetirizine (ZYRTEC) 10 MG tablet TAKE ONE TABLET BY MOUTH ONCE DAILY 30 tablet 11  . donepezil (ARICEPT) 5 MG tablet Take 1 tablet (5 mg total) by mouth at bedtime. 90 tablet 1  . famotidine (PEPCID) 20 MG tablet Take 1 tablet (20 mg total) by mouth 2 (two) times daily. 60 tablet 2  . furosemide (LASIX) 20 MG tablet Take 1 tablet (20 mg total) by mouth as needed for edema. 90 tablet 3  . haloperidol (HALDOL) 0.5 MG tablet Take 1 tablet (0.5 mg total) by mouth every 8 (eight) hours as needed for agitation. 90 tablet 0  . hydrochlorothiazide (MICROZIDE) 12.5 MG capsule Take 12.5 mg by mouth daily.    [name] lidocaine (LIDODERM) 5 % Place 2 patches onto the skin daily. Remove & Discard patch within 12 hours or as directed by MD 60 patch 0  . losartan (COZAAR) 50 MG  tablet Take 1 tablet by mouth once daily 90 tablet 3  . mirtazapine (REMERON) 30 MG tablet Take 30 mg by mouth at bedtime.    . NONFORMULARY OR COMPOUNDED ITEM Ketamine 10%, Baclofen 2%, Cyclobenzaprine 2%, Ketoprofen 10%, Gabapentin 6%, Lidocaine 1%, Amitryptiline 5% 1 each 1  . pregabalin (LYRICA) 75 MG capsule Take 1 capsule by mouth twice daily 60 capsule 0  . umeclidinium-vilanterol (ANORO ELLIPTA) 62.5-25 MCG/INH AEPB Inhale 1 puff into the lungs daily. 30 each 3  . VITAMIN D, CHOLECALCIFEROL, PO Take 1 tablet by mouth daily. gummy    . XARELTO 15 MG TABS tablet TAKE 1 TABLET BY MOUTH ONCE DAILY WITH  SUPPER 60 tablet 5   No current facility-administered medications for this visit.     Past Medical History:  Diagnosis Date  . Arthritis   . Atrial fibrillation (HCC)   . Chronic neck pain   . Diverticulosis of colon (without mention of hemorrhage)   . Family history of malignant neoplasm of gastrointestinal tract   . Gastritis   . GERD (gastroesophageal reflux disease)   . Hiatal hernia   . HTN (hypertension)   . LBP (low back pain)   . Memory problem   . Spondylosis   . Thrombocytopenia (HCC)   .  Type II or unspecified type diabetes mellitus without mention of complication, not stated as uncontrolled     ROS:   All systems reviewed and negative except as noted in the HPI.   Past Surgical History:  Procedure Laterality Date  . COLONOSCOPY  2010   NORMAL-- DUE 2020  . PACEMAKER IMPLANT N/A 12/16/2020   Procedure: PACEMAKER IMPLANT;  Surgeon: Marinus Maw, MD;  [facility] INVASIVE CV LAB;  Service: Cardiovascular;  Laterality: N/A;  . TUBAL LIGATION       Family History  Problem Relation Age of Onset  . Emphysema Brother   . Stroke Father 44  . Ovarian cancer Sister   . Breast cancer Sister   . Liver disease Brother   . Colon cancer Daughter   . Diabetes Daughter   . Throat cancer Brother   . Lung cancer Daughter      Social History   Socioeconomic  History  . Marital status: Widowed    Spouse name: deceased  . Number of children: 4  . Years of education: Not on file  . Highest education level: Not on file  Occupational History  . Occupation: Retired Advertising copywriter  Tobacco Use  . Smoking status: Never Smoker  . Smokeless tobacco: Never Used  Substance and Sexual Activity  . Alcohol use: No    Alcohol/week: 0.0 standard drinks  . Drug use: No  . Sexual activity: Not on file  Other Topics Concern  . Not on file  Social History Narrative   Widow    4 children: 1 son '59; 3 dtrs '53, '62, '64; 9 grandchildren; great-grand   Lives with dtr at home, grandson at home      Daily Caffeine Use:  1 cup   Regular Exercise -  NO   [name] visually impaired use braille   Son passed in 2017   Social Determinants of Health   Financial Resource Strain: Not on file  Food Insecurity: No Food Insecurity  . Worried About Programme researcher, broadcasting/film/video in the Last Year: Never true  . Ran Out of Food in the Last Year: Never true  Transportation Needs: Not on file  Physical Activity: Not on file  Stress: Not on file  Social Connections: Not on file  Intimate Partner Violence: Not At Risk  . Fear of Current or Ex-Partner: No  . Emotionally Abused: No  . Physically Abused: No  . Sexually Abused: No     BP (!) 144/80   Pulse 84   Ht 5' (1.524 m)   Wt 170 lb 6.4 oz (77.3 kg)   SpO2 97%   BMI 33.28 kg/m   Physical Exam:  Well appearing NAD HEENT: Unremarkable Neck:  No JVD, no thyromegally Lymphatics:  No adenopathy Back:  No CVA tenderness Lungs:  Clear with no wheezes HEART:  Regular rate rhythm, no murmurs, no rubs, no clicks Abd:  soft, positive bowel sounds, no organomegally, no rebound, no guarding Ext:  2 plus pulses, no edema, no cyanosis, no clubbing Skin:  No rashes no nodules Neuro:  CN II through XII intact, motor grossly intact   DEVICE  Normal device function.  See PaceArt for details.   Assess/Plan: 1. Atrial fib -  her vr is well controlled. No change in her meds. 2. CHB - she is asymptomatic s/p PPM insertion.  3. PM - her medtronic Single chamber PPM is working normally 4. HTN -her bp is fairly well controlled. No change in her meds.  Sharlot Gowda  Kainat Pizana,MD

## 2021-04-07 NOTE — Progress Notes (Signed)
Remote pacemaker transmission.   

## 2021-04-28 ENCOUNTER — Other Ambulatory Visit: Payer: Self-pay

## 2021-04-28 ENCOUNTER — Other Ambulatory Visit: Payer: Self-pay | Admitting: *Deleted

## 2021-04-28 NOTE — Patient Outreach (Signed)
[facility]) Care Management  04/28/2021  Alyssa Lindsey 07-04-1929 [id number]   [facility] outreach to complex care patient referred by [facility]   Alyssa Lindsey was referred to [facility] on 12/19/20 for  RED ON EMMI ALERT Day #  1 Date: Monday 12/19/20 1316 patient Red Alert Reason: wounds healing well? No    EMMI resolved per daughter on 03/14/21 Wound healed but with some questionable scratch marks per daughter     Insurance: Monia Pouch medicare [facility] x  2 ED visits x 2 in the last 6 months    Last admission  03/14/21 Behavioral concerns 12/15/20-12/17/20 systemic bradycardia s/p Medtronic single chamber pace maker implantation , Diabetes (DM) type 2 Hypertension (HTN) GERD (gastroesophageal reflux disease) Atrial fibrillation with slow ventricular rate [facility]   [facility] follow up outreach assessment  Her daughter, Alyssa Lindsey, reports she is doing well  Pt was outside walking today [facility] palliative continues to visit weekly last visit 04/27/21 She eats when she wants to, takes in spoonful of foods, buttered toast, cheese and cracker, apple sauce  Suspect she up in mid of night Alyssa Lindsey was recently sick, "cold" cough, congestion covid negative  Patient only had a cough, cough syrup given, symptoms resolve 5/4/2 Cardiac visit went well to return annually Pacemaker doing well Still some edema encouraged to elevate her feet but not compliant with elevation 172. Encouraged to monitor wt for edema and follow [facility] actions   Plans Patient agrees to care plan and follow up within the next 30 business days  Pt encouraged to return a call to [facility] RN CM prn   Bernice Mullin L. [name], RN, BSN, CCM [facility] Telephonic Care Management Care Coordinator Office number [phone] Main [facility] number [phone] Fax number [phone]

## 2021-05-03 ENCOUNTER — Other Ambulatory Visit: Payer: Self-pay | Admitting: Internal Medicine

## 2021-05-15 ENCOUNTER — Telehealth: Payer: Self-pay

## 2021-05-15 NOTE — Telephone Encounter (Signed)
Spoke with patient's daughter [name] and scheduled an in-person Palliative Consult for 06/08/21 @ 12:30PM.  COVID screening was negative. No pets in home. Patient lives with both daughters [name] & [name].  Consent obtained; updated Outlook/Netsmart/Team List and Epic.   Family is aware they may be receiving a call from NP the day before or day of to confirm appointment.

## 2021-05-16 ENCOUNTER — Other Ambulatory Visit: Payer: Self-pay

## 2021-05-16 ENCOUNTER — Other Ambulatory Visit: Payer: Self-pay | Admitting: *Deleted

## 2021-05-16 ENCOUNTER — Encounter: Payer: Self-pay | Admitting: *Deleted

## 2021-05-16 NOTE — Patient Outreach (Addendum)
[facility]) Care Management  05/16/2021  Alyssa Lindsey [birth year] [id number]  [facility] outreach to complex care patient referred by [facility]   [name] was referred to [facility] on 12/19/20 for  RED ON EMMI ALERT Day #  1 Date: Monday 12/19/20 1316 patient Red Alert Reason: wounds healing well? No    EMMI resolved per daughter on 03/14/21 Wound healed but with some questionable scratch marks per daughter     Insurance: Monia Pouch medicare [facility] x  2 ED visits x 2 in the last 6 months    Last admission  03/14/21 Behavioral concerns 12/15/20-12/17/20 systemic bradycardia s/p Medtronic single chamber pace maker implantation , Diabetes (DM) type 2 Hypertension (HTN) GERD (gastroesophageal reflux disease) Atrial fibrillation with slow ventricular rate [facility]    Patient is able to verify HIPAA [facility] Portability and Accountability Act) identifiers Reviewed and addressed the purpose of the follow up call with the patient  Consent: [facility] ([facility] Customer service manager) RN CM reviewed [facility] services with patient. Patient gave verbal consent for services.  [facility] follow up outreach assessment Power outage today in the home Prior to the end of the outreach this was restored Patient is fine [facility] changed patient to primary palliative care with re-certification Next appointment on 06/08/21  She is taking her medicines as ordered Dementia medicines (Aricept, haldol, Remeron) Patient is going outside to swing, out riding with daughter, Alyssa Lindsey Wound has healed Appetite improved taking medications as ordered, No Afib, DM or HTN worsening symptoms, interacting better with family members, walking in yard,  Reviewed other levels of care options: like medicaid application for personal care services, adult day programs,PACE, dementia care giver services Reports patient would not be motivated to go to adult day or PACE programs Edit, reactivated, updated view date of  EMMI education   Past Medical History:  Diagnosis Date   Arthritis    Atrial fibrillation (HCC)    Chronic neck pain    Diverticulosis of colon (without mention of hemorrhage)    Family history of malignant neoplasm of gastrointestinal tract    Gastritis    GERD (gastroesophageal reflux disease)    Hiatal hernia    HTN (hypertension)    LBP (low back pain)    Memory problem    Spondylosis    Thrombocytopenia (HCC)    Type II or unspecified type diabetes mellitus without mention of complication, not stated as uncontrolled     Plans Patient agrees to case closure as there are not further identified care coordination nor disease management needs  Pt encouraged to return a call to [facility] RN CM prn  letters to patient and pcp    Goals Addressed               This Visit's Progress     Patient Stated     COMPLETED: [facility]) Eat Healthy (pt-stated)   On track     Timeframe:  Short-Term Goal Priority:  High Start Date:       01/24/21                      Expected End Date:    04/25/21                   Case closure 05/16/21    - set a realistic goal  Increase fluid intake 2-4 glasses or more as tolerated Take ensure if a meal is missed   Barriers: Knowledge  Notes:   05/16/21 Goal complete /closed Patient doing well [facility] services decreased, appetite improved taking medications as ordered, No Afib, DM or HTN worsening symptoms, interacting better with family members, swinging on outdoor swing, walking in yard, riding out with family  04/28/21 appetite improving wound healed, continues with [facility] care services  03/28/21 appetite improving Eating what she likes Has home hospice/palliative care services Now taking in medications better that is assisting with improved behavior 03/06/21 outreach to [facility] RN CM with return call, not eating, drinking or taking pills well per daughter 02/28/21 pt with decrease solid and fluid intake, increased behavioral symptoms For pcp appointment on  this week 01/24/21 pt agreed to work on goals to "drink and moveAsbury Automotive Group L. [name], RN, BSN, CCM [facility] Telephonic Care Management Care Coordinator Office number [phone] Main [facility] number [phone] Fax number [phone]

## 2021-06-08 ENCOUNTER — Other Ambulatory Visit: Payer: Medicare HMO | Admitting: Hospice

## 2021-06-08 ENCOUNTER — Other Ambulatory Visit: Payer: Self-pay

## 2021-06-08 DIAGNOSIS — F0391 Unspecified dementia with behavioral disturbance: Secondary | ICD-10-CM

## 2021-06-08 DIAGNOSIS — Z515 Encounter for palliative care: Secondary | ICD-10-CM

## 2021-06-08 DIAGNOSIS — G301 Alzheimer's disease with late onset: Secondary | ICD-10-CM

## 2021-06-08 DIAGNOSIS — R69 Illness, unspecified: Secondary | ICD-10-CM | POA: Diagnosis not present

## 2021-06-08 DIAGNOSIS — F0281 Dementia in other diseases classified elsewhere with behavioral disturbance: Secondary | ICD-10-CM

## 2021-06-08 DIAGNOSIS — F03911 Unspecified dementia, unspecified severity, with agitation: Secondary | ICD-10-CM

## 2021-06-08 NOTE — Progress Notes (Signed)
[facility], [facility] Palliative Care Consult Note Telephone: [phone]  Fax: [phone]  PATIENT NAME: Alyssa Lindsey [address] [place] [address] [phone] (home)  DOB: 31-Aug-1929 MRN: [id number]  PRIMARY CARE PROVIDER:    Hoyt Koch, MD,  [place] [address] [phone]  REFERRING PROVIDER:   Hoyt Koch, MD [address] [place],  [place] [address] [phone]  RESPONSIBLE PARTY:   Self HPCOA: Ermalinda Barrios to call [name] [phone] [place]     Name Relation Home Work Mobile   [place] Daughter [phone]     Melder,[name] Daughter [phone]  [phone]   Layliana, Devins Daughter   [phone]      I met face to face with patient and family at home. Palliative Care was asked to follow this patient by consultation request of  Hoyt Koch, * to address advance care planning, complex medical decision making and goals of care clarification. [name] and [name] are present with patient during visit. This is the initial visit.    ASSESSMENT AND / RECOMMENDATIONS:   Advance Care Planning: Our advance care planning conversation included a discussion about:    The value and importance of advance care planning  Difference between Hospice and Palliative care Exploration of goals of care in the event of a sudden injury or illness  Identification and preparation of a healthcare agent  Review and updating or creation of an  advance directive document .   CODE STATUS: Discussion on the ramifications and implications of code status. Patient affirmed she is a Full code.   Goals of Care: Goals include to maximize quality of life and symptom management  I spent  46 minutes providing this initial consultation. More than 50% of the time in this consultation was spent on counseling patient and coordinating  communication. --------------------------------------------------------------------------------------------------------------------------------------  Symptom Management/Plan: Dementia: memory loss and confusion. Education on Dementia as a progressive and terminal disease and what to expect. FAST 6C. PPS 60%. Safety and Fall precautions discussed. Continue Aricept and ongoing supportive care. Encouraged reminiscence, word puzzle/search. Routine CBC BMP Afib: Pacemaker present. Patient on Xarelto HTN: Amlodipine, Losartan Agitation: De escalation techniques; redirection. Haldol as ordered Anxiety: Managed with Lorazepam  Follow up: Palliative care will continue to follow for complex medical decision making, advance care planning, and clarification of goals. Return 6 weeks or prn.Encouraged to call provider sooner with any concerns.   Family /Caregiver/Community Supports: Patient lives at home with her two daughter [name] and [name]. Strong support system identified.  HOSPICE ELIGIBILITY/DIAGNOSIS: TBD  Chief Complaint: Initial Palliative care visit  HISTORY OF PRESENT ILLNESS:  Alyssa Lindsey is a 87 y.o. year old female  with multiple medical conditions including Dementia with associated memory loss and confusion, progressive, impairing her independence; worse when she is upset; redirection is often helpful. History of Afib, Hypertension, agitation, anxiety. History obtained from review of EMR, discussion with primary team, caregiver, family and/or [name].  Review and summarization of Epic records shows history from other than patient. Rest of 10 point ROS asked and negative.     Review of lab tests/diagnostics   Results for [name], [name] (MRN [id number]) as of 06/08/2021 13:20  Ref. Range 03/14/2021 11:55  Sodium Latest Ref Range: 135 - 145 mmol/L 142  Potassium Latest Ref Range: 3.5 - 5.1 mmol/L 3.3 (L)  Chloride Latest Ref Range: 98 - 111 mmol/L 108  CO2 Latest Ref Range: 22 -  32  mmol/L 22  Glucose Latest Ref Range: 70 - 99 mg/dL 161 (H)  BUN Latest Ref Range: 8 - 23 mg/dL 8  Creatinine Latest Ref Range: 0.44 - 1.00 mg/dL 1.38 (H)  Calcium Latest Ref Range: 8.9 - 10.3 mg/dL 8.9  Anion gap Latest Ref Range: 5 - 15  12  Alkaline Phosphatase Latest Ref Range: 38 - 126 U/L 62  Albumin Latest Ref Range: 3.5 - 5.0 g/dL 3.5  AST Latest Ref Range: 15 - 41 U/L 24  ALT Latest Ref Range: 0 - 44 U/L 13  Total Protein Latest Ref Range: 6.5 - 8.1 g/dL 6.8  Total Bilirubin Latest Ref Range: 0.3 - 1.2 mg/dL 0.6  GFR, Estimated Latest Ref Range: >60 mL/min 37 (L)    ROS General: NAD EYES: denies vision changes ENMT: denies dysphagia Cardiovascular: denies chest pain/discomfort Pulmonary: denies cough, denies SOB Abdomen: endorses good appetite, denies constipation/diarrhea GU: denies dysuria, urinary frequency MSK:  endorses weakness,  no falls reported Skin: denies rashes or wounds Neurological: denies pain, denies insomnia Psych: Endorses positive mood Heme/lymph/immuno: denies bruises, abnormal bleeding  Physical Exam: Height/Weight 5 feet 5 inches/172Ibs Constitutional: NAD General: Well groomed, cooperative EYES: anicteric sclera, lids intact, no discharge  ENMT: Moist mucous membrane CV: S1 S2, RRR, no LE edema Pulmonary: LCTA, no increased work of breathing, no cough, Abdomen: active BS + 4 quadrants, soft and non tender GU: no suprapubic tenderness MSK: ambulatory without assistive device Skin: warm and dry, no rashes or wounds on visible skin Neuro:  weakness, otherwise non focal, memory loss Psych: non-anxious affect Hem/lymph/immuno: no widespread bruising   PAST MEDICAL HISTORY:  Active Ambulatory Problems    Diagnosis Date Noted   THYROID NODULE 03/03/2008   Diabetes type 2, controlled ([facility]) 06/21/2007   B12 deficiency 03/06/2010   Essential hypertension 06/21/2007   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   BREAST MASS 03/03/2008    Chronic LLQ pain 02/07/2012   Right knee pain 03/04/2014   GERD (gastroesophageal reflux disease) 03/02/2015   Routine general medical examination at a health care facility 12/26/2015   Bilateral leg edema 04/11/2016   Numbness and tingling of both legs 04/25/2016   Low back pain 05/09/2016   Adjustment disorder with mixed anxiety and depressed mood 07/06/2016   Constipation 11/18/2017   Breast pain, left 10/06/2019   PHN (postherpetic neuralgia) 12/02/2019   Educated about COVID-19 virus infection 04/12/2020   DOE (dyspnea on exertion) 04/12/2020   Permanent atrial fibrillation ([facility]) 04/12/2020   Thrombocytopenia ([facility]) 04/18/2020   Blood in sputum 04/18/2020   Chronic atrial fibrillation ([facility]) 04/20/2020   Sleep disturbance 09/06/2020   Stage 3a chronic kidney disease ([facility]) 09/18/2020   Leg swelling 09/18/2020   Myalgia 10/25/2020   Symptomatic bradycardia 12/15/2020   Urinary frequency 01/12/2021   Generalized weakness 01/12/2021   Dehydration 01/12/2021   Late onset Alzheimer's dementia with behavioral disturbance ([facility]) 03/17/2021   Pacemaker 03/29/2021   Resolved Ambulatory Problems    Diagnosis Date Noted   IRREGULAR HEART RATE 03/22/2008   GASTROENTERITIS 06/07/2009   DIVERTICULOSIS-COLON 08/08/2009   HEMATOCHEZIA 09/16/2008   NECK PAIN, CHRONIC 03/22/2008   LOW BACK PAIN 06/21/2007   BURSITIS, LEFT HIP 07/09/2008   Memory loss 07/28/2009   WEIGHT LOSS 01/29/2009   Nausea alone 08/08/2009   FLANK PAIN, LEFT 06/10/2009   COLONIC POLYPS, HX OF 06/21/2007   Type II or unspecified type diabetes mellitus with neurological manifestations, not stated as uncontrolled(250.60) 05/25/2011   Lumbago with sciatica  of right side 03/06/2012   Hip pain, bilateral 03/21/2012   Pelvic pain 05/01/2012   Paroxysmal A-fib ([facility]) 03/04/2014   Hypertension 03/04/2014   Muscle pain 01/21/2015   Medication refill 03/02/2015   Tongue disorder 08/08/2015   Cough 11/15/2015   Acute  bronchitis 11/15/2015   Edema 04/06/2016   Acute URI 04/06/2016   Herpes zoster without complication 62/13/0865   Need for shingles vaccine 12/02/2019   Stage 3b chronic kidney disease ([facility]) 04/20/2020   Past Medical History:  Diagnosis Date   Arthritis    Atrial fibrillation (HCC)    Chronic neck pain    Family history of malignant neoplasm of gastrointestinal tract    Gastritis    Hiatal hernia    HTN (hypertension)    LBP (low back pain)    Memory problem    Spondylosis    Type II or unspecified type diabetes mellitus without mention of complication, not stated as uncontrolled     SOCIAL HX:  Social History   Tobacco Use   Smoking status: Never   Smokeless tobacco: Never  Substance Use Topics   Alcohol use: No    Alcohol/week: 0.0 standard drinks     FAMILY HX:  Family History  Problem Relation Age of Onset   Emphysema Brother    Stroke Father 3   Ovarian cancer Sister    Breast cancer Sister    Liver disease Brother    Colon cancer Daughter    Diabetes Daughter    Throat cancer Brother    Lung cancer Daughter       ALLERGIES:  Allergies  Allergen Reactions   Dicyclomine Hcl Other (See Comments)    Severe eye pain   Lisinopril Cough   Aspirin Other (See Comments)    Unknown reaction      PERTINENT MEDICATIONS:  Outpatient Encounter Medications as of 06/08/2021  Medication Sig   acetaminophen (TYLENOL) 650 MG CR tablet Take 1,300 mg by mouth every 8 (eight) hours as needed for pain.   albuterol (PROVENTIL) (2.5 MG/3ML) 0.083% nebulizer solution Take 3 mLs (2.5 mg total) by nebulization every 6 (six) hours as needed for wheezing or shortness of breath.   albuterol (VENTOLIN HFA) 108 (90 Base) MCG/ACT inhaler Inhale 2 puffs into the lungs every 8 (eight) hours.   ALPRAZolam (XANAX) 0.25 MG tablet Take 1 tablet (0.25 mg total) by mouth 2 (two) times daily as needed for anxiety.   amLODipine (NORVASC) 5 MG tablet Take 1 tablet (5 mg total) by mouth daily.    Capsaicin 0.075 % STCK .075% topically twice per day   cetirizine (ZYRTEC) 10 MG tablet TAKE ONE TABLET BY MOUTH ONCE DAILY   donepezil (ARICEPT) 5 MG tablet Take 1 tablet (5 mg total) by mouth at bedtime.   famotidine (PEPCID) 20 MG tablet Take 1 tablet (20 mg total) by mouth 2 (two) times daily.   furosemide (LASIX) 20 MG tablet Take 1 tablet (20 mg total) by mouth as needed for edema.   haloperidol (HALDOL) 0.5 MG tablet TAKE 1 TABLET BY MOUTH EVERY 8 HOURS AS NEEDED FOR  AGITATION   hydrochlorothiazide (MICROZIDE) 12.5 MG capsule Take 12.5 mg by mouth daily.   lidocaine (LIDODERM) 5 % Place 2 patches onto the skin daily. Remove & Discard patch within 12 hours or as directed by MD   losartan (COZAAR) 50 MG tablet Take 1 tablet by mouth once daily   mirtazapine (REMERON) 30 MG tablet Take 30 mg by mouth at  bedtime.   NONFORMULARY OR COMPOUNDED ITEM Ketamine 10%, Baclofen 2%, Cyclobenzaprine 2%, Ketoprofen 10%, Gabapentin 6%, Lidocaine 1%, Amitryptiline 5%   pregabalin (LYRICA) 75 MG capsule Take 1 capsule by mouth twice daily   umeclidinium-vilanterol (ANORO ELLIPTA) 62.5-25 MCG/INH AEPB Inhale 1 puff into the lungs daily.   VITAMIN D, CHOLECALCIFEROL, PO Take 1 tablet by mouth daily. gummy   XARELTO 15 MG TABS tablet TAKE 1 TABLET BY MOUTH ONCE DAILY WITH  SUPPER   No facility-administered encounter medications on file as of 06/08/2021.     Thank you for the opportunity to participate in the care of Ms. Carrozza.  The palliative care team will continue to follow. Please call our office at [phone] if we can be of additional assistance.   Note: Portions of this note were generated with Lobbyist. Dictation errors may occur despite best attempts at proofreading.  Teodoro Spray, NP

## 2021-06-18 ENCOUNTER — Other Ambulatory Visit: Payer: Self-pay | Admitting: Internal Medicine

## 2021-06-21 ENCOUNTER — Ambulatory Visit (INDEPENDENT_AMBULATORY_CARE_PROVIDER_SITE_OTHER): Payer: Medicare HMO

## 2021-06-21 DIAGNOSIS — I442 Atrioventricular block, complete: Secondary | ICD-10-CM | POA: Diagnosis not present

## 2021-06-21 LAB — CUP PACEART REMOTE DEVICE CHECK
Battery Remaining Longevity: 154 mo
Battery Voltage: 3.16 V
Brady Statistic RV Percent Paced: 74.05 %
Date Time Interrogation Session: 20220727033005
Implantable Lead Implant Date: 20220121
Implantable Lead Location: 753860
Implantable Lead Model: 3830
Implantable Pulse Generator Implant Date: 20220121
Lead Channel Impedance Value: 418 Ohm
Lead Channel Impedance Value: 608 Ohm
Lead Channel Pacing Threshold Amplitude: 0.875 V
Lead Channel Pacing Threshold Pulse Width: 0.4 ms
Lead Channel Sensing Intrinsic Amplitude: 18.25 mV
Lead Channel Sensing Intrinsic Amplitude: 18.25 mV
Lead Channel Setting Pacing Amplitude: 2.5 V
Lead Channel Setting Pacing Pulse Width: 0.4 ms
Lead Channel Setting Sensing Sensitivity: 0.9 mV

## 2021-06-28 ENCOUNTER — Other Ambulatory Visit: Payer: Medicare HMO | Admitting: Hospice

## 2021-06-28 ENCOUNTER — Other Ambulatory Visit: Payer: Self-pay

## 2021-07-14 NOTE — Progress Notes (Signed)
Remote pacemaker transmission.   

## 2021-07-24 ENCOUNTER — Other Ambulatory Visit: Payer: Medicare HMO | Admitting: Hospice

## 2021-07-24 ENCOUNTER — Other Ambulatory Visit: Payer: Self-pay

## 2021-07-24 DIAGNOSIS — R69 Illness, unspecified: Secondary | ICD-10-CM | POA: Diagnosis not present

## 2021-07-24 DIAGNOSIS — Z515 Encounter for palliative care: Secondary | ICD-10-CM | POA: Diagnosis not present

## 2021-07-24 DIAGNOSIS — F0391 Unspecified dementia with behavioral disturbance: Secondary | ICD-10-CM

## 2021-07-24 DIAGNOSIS — R634 Abnormal weight loss: Secondary | ICD-10-CM | POA: Diagnosis not present

## 2021-07-24 DIAGNOSIS — F03911 Unspecified dementia, unspecified severity, with agitation: Secondary | ICD-10-CM

## 2021-07-24 NOTE — Progress Notes (Signed)
Therapist, nutritional Palliative Care Consult Note Telephone: [phone]  Fax: [phone]  PATIENT NAME: Alyssa Lindsey DOB: [birth year] MRN: [id number]  PRIMARY CARE PROVIDER:   Myrlene Broker, MD Myrlene Broker, MD [address] [place],  [place] [address]  REFERRING PROVIDER: Myrlene Broker, MD Myrlene Broker, MD [address] [place],  [place] [address]  RESPONSIBLE PARTY:  Self HPCOA: [name] and Reine Just to call [name] [phone] Contact Information     Name Relation Home Work Mobile   [place] Daughter [phone]     Kittle,[name] Daughter [phone]  [phone]   Aaleah, Hirsch Daughter   [phone]     TELEHEALTH VISIT STATEMENT Due to the COVID-19 crisis, this visit was done via telemedicine and it was initiated and consent by this patient and or family. Video-audio (telehealth) contact was unable to be done due to technical barriers from the patient's side.   Visit is to build trust and highlight Palliative Medicine as specialized medical care for people living with serious illness, aimed at facilitating better quality of life through symptoms relief, assisting with advance care planning and complex medical decision making. This is a follow up visit.  RECOMMENDATIONS/PLAN:   Advance Care Planning/Code Status: Patient is a  Full code. [name] said she will further discuss with patient/family for possible change in code status.   Goals of Care: Goals of care include to maximize quality of life and symptom management.  Visit consisted of counseling and education dealing with the complex and emotionally intense issues of symptom management and palliative care in the setting of serious and potentially life-threatening illness. Palliative care team will continue to support patient, patient's family, and medical team.  Symptom management/Plan:  Weight loss: Currently 163 Ibs down from 172 Ibs last  month.  [name] reports patient's sister passed last month and since then patient has been declining, not eating as much as before. . Encourage 4-6 small meals of choice daily, provide assistance as needed to ensure adequate oral intake. Give Ensure BID. Consider to initiate Mirtazapine 7.5mg  at bedtime in the future if needed.  Dementia: memory loss and confusion is ongoing. Education on Dementia as a progressive and terminal disease and what to expect. FAST 6C. PPS 50% down from 60% last month. Safety and Fall precautions discussed. Continue Aricept and ongoing supportive care. Encouraged reminiscence, word puzzle/search.  HTN: Managed with Amlodipine, Losartan. Agitation: De escalation techniques; redirection. Haldol as ordered Anxiety: Managed with Lorazepam  Follow up: Palliative care will continue to follow for complex medical decision making, advance care planning, and clarification of goals. Return 6 weeks or prn. Encouraged to call provider sooner with any concerns.  CHIEF COMPLAINT: Palliative follow up  HISTORY OF PRESENT ILLNESS:  Alyssa Lindsey a 87 y.o. female with multiple medical problems including Dementia, hypertension, A. fib, agitation, anxiety, weight loss.  Patient denies pain/discomfort.  History obtained from review of EMR, discussion with primary team, family and/or patient. Records reviewed and summarized above. All 10 point systems reviewed and are negative except as documented in history of present illness above  Review and summarization of Epic records shows history from other than patient.   Palliative Care was asked to follow this patient o help address complex decision making in the context of advance care planning and goals of care clarification.   PERTINENT MEDICATIONS:  Outpatient Encounter Medications as of 07/24/2021  Medication Sig   acetaminophen (TYLENOL) 650 MG CR tablet Take 1,300  mg by mouth every 8 (eight) hours as needed for pain.   albuterol  (PROVENTIL) (2.5 MG/3ML) 0.083% nebulizer solution Take 3 mLs (2.5 mg total) by nebulization every 6 (six) hours as needed for wheezing or shortness of breath.   albuterol (VENTOLIN HFA) 108 (90 Base) MCG/ACT inhaler Inhale 2 puffs into the lungs every 8 (eight) hours.   ALPRAZolam (XANAX) 0.25 MG tablet Take 1 tablet (0.25 mg total) by mouth 2 (two) times daily as needed for anxiety.   amLODipine (NORVASC) 5 MG tablet Take 1 tablet (5 mg total) by mouth daily.   Capsaicin 0.075 % STCK .075% topically twice per day   cetirizine (ZYRTEC) 10 MG tablet TAKE ONE TABLET BY MOUTH ONCE DAILY   donepezil (ARICEPT) 5 MG tablet Take 1 tablet (5 mg total) by mouth at bedtime.   famotidine (PEPCID) 20 MG tablet Take 1 tablet (20 mg total) by mouth 2 (two) times daily.   furosemide (LASIX) 20 MG tablet Take 1 tablet (20 mg total) by mouth as needed for edema.   haloperidol (HALDOL) 0.5 MG tablet TAKE 1 TABLET BY MOUTH EVERY 8 HOURS AS NEEDED FOR  AGITATION   hydrochlorothiazide (MICROZIDE) 12.5 MG capsule Take 12.5 mg by mouth daily.   lidocaine (LIDODERM) 5 % Place 2 patches onto the skin daily. Remove & Discard patch within 12 hours or as directed by MD   losartan (COZAAR) 50 MG tablet Take 1 tablet by mouth once daily   mirtazapine (REMERON) 30 MG tablet Take 30 mg by mouth at bedtime.   NONFORMULARY OR COMPOUNDED ITEM Ketamine 10%, Baclofen 2%, Cyclobenzaprine 2%, Ketoprofen 10%, Gabapentin 6%, Lidocaine 1%, Amitryptiline 5%   pregabalin (LYRICA) 75 MG capsule Take 1 capsule by mouth twice daily   umeclidinium-vilanterol (ANORO ELLIPTA) 62.5-25 MCG/INH AEPB Inhale 1 puff into the lungs daily.   VITAMIN D, CHOLECALCIFEROL, PO Take 1 tablet by mouth daily. gummy   XARELTO 15 MG TABS tablet TAKE 1 TABLET BY MOUTH ONCE DAILY WITH  SUPPER   No facility-administered encounter medications on file as of 07/24/2021.    HOSPICE ELIGIBILITY/DIAGNOSIS: TBD  PAST MEDICAL HISTORY:  Past Medical History:   Diagnosis Date   Arthritis    Atrial fibrillation (HCC)    Chronic neck pain    Diverticulosis of colon (without mention of hemorrhage)    Family history of malignant neoplasm of gastrointestinal tract    Gastritis    GERD (gastroesophageal reflux disease)    Hiatal hernia    HTN (hypertension)    LBP (low back pain)    Memory problem    Spondylosis    Thrombocytopenia (HCC)    Type II or unspecified type diabetes mellitus without mention of complication, not stated as uncontrolled      ALLERGIES:  Allergies  Allergen Reactions   Dicyclomine Hcl Other (See Comments)    Severe eye pain   Lisinopril Cough   Aspirin Other (See Comments)    Unknown reaction      I spent 40 minutes providing this consultation; this includes time spent with patient/family, chart review and documentation. More than 50% of the time in this consultation was spent on counseling and coordinating communication   Thank you for the opportunity to participate in the care of MARRIETTA THUNDER Please call our office at [phone] if we can be of additional assistance.  Note: Portions of this note were generated with Scientist, clinical (histocompatibility and immunogenetics). Dictation errors may occur despite best attempts at proofreading.  Auburn Bilberry I  Ivalene Platte, NP

## 2021-07-30 ENCOUNTER — Other Ambulatory Visit: Payer: Self-pay | Admitting: Internal Medicine

## 2021-08-11 ENCOUNTER — Telehealth: Payer: Self-pay | Admitting: Cardiology

## 2021-08-11 MED ORDER — FUROSEMIDE 20 MG PO TABS: 20.0000 mg | ORAL_TABLET | ORAL | 3 refills | Status: DC | PRN

## 2021-08-11 MED ORDER — HYDROCHLOROTHIAZIDE 12.5 MG PO CAPS: 12.5000 mg | ORAL_CAPSULE | Freq: Every day | ORAL | 0 refills | Status: AC

## 2021-08-11 NOTE — Telephone Encounter (Signed)
*  STAT* If patient is at the pharmacy, call can be transferred to refill team.   1. Which medications need to be refilled? (please list name of each medication and dose if known)  hydrochlorothiazide (MICROZIDE) 12.5 MG capsule furosemide (LASIX) 20 MG tablet  2. Which pharmacy/location (including street and city if local pharmacy) is medication to be sent to? [facility] - [place], [place] - [address]  3. Do they need a 30 day or 90 day supply? 90 day  Patient is completely out of medication.

## 2021-08-14 ENCOUNTER — Ambulatory Visit (INDEPENDENT_AMBULATORY_CARE_PROVIDER_SITE_OTHER): Payer: Medicare HMO

## 2021-08-14 VITALS — Ht 60.0 in | Wt 166.0 lb

## 2021-08-14 DIAGNOSIS — Z0000 Encounter for general adult medical examination without abnormal findings: Secondary | ICD-10-CM | POA: Diagnosis not present

## 2021-08-14 NOTE — Progress Notes (Addendum)
I connected with Daisja Kessinger today by telephone and verified that I am speaking with the correct person using two identifiers. Location patient: home Location provider: work Persons participating in the virtual visit: Denelda Akerley, Kenneshia Rehm (daughter) and Susie Cassette, LPN.   I discussed the limitations, risks, security and privacy concerns of performing an evaluation and management service by telephone and the availability of in person appointments. I also discussed with the patient that there may be a patient responsible charge related to this service. The patient expressed understanding and verbally consented to this telephonic visit.    Interactive audio and video telecommunications were attempted between this provider and patient, however failed, due to patient having technical difficulties OR patient did not have access to video capability.  We continued and completed visit with audio only.  Some vital signs may be absent or patient reported.   Time Spent with patient on telephone encounter: 40 minutes  Subjective:   Alyssa Lindsey is a 87 y.o. female who presents for Medicare Annual (Subsequent) preventive examination.  Review of Systems     Cardiac Risk Factors include: advanced age (>15men, >59 women);family history of premature cardiovascular disease;hypertension;obesity (BMI >30kg/m2);diabetes mellitus     Objective:    Today's Vitals   08/14/21 1327  Weight: 166 lb (75.3 kg)  Height: 5' (1.524 m)   Body mass index is 32.42 kg/m.  Advanced Directives 08/14/2021 01/03/2021 12/16/2020 12/15/2020 02/24/2020 05/17/2016 09/08/2015  Does Patient Have a Medical Advance Directive? Yes Yes Yes Yes No No Yes  Type of Advance Directive Living will Living will;Healthcare Power of Attorney Living will;Healthcare Power of Attorney - - - Living will;Healthcare Power of Attorney  Does patient want to make changes to medical advance directive? No - Patient declined No - Guardian  declined No - Patient declined - - - -  Copy of Healthcare Power of Attorney in Chart? - No - copy requested No - copy requested - - - No - copy requested  Would patient like information on creating a medical advance directive? - - - - No - Patient declined No - patient declined information -    Current Medications (verified) Outpatient Encounter Medications as of 08/14/2021  Medication Sig   acetaminophen (TYLENOL) 650 MG CR tablet Take 1,300 mg by mouth every 8 (eight) hours as needed for pain.   albuterol (VENTOLIN HFA) 108 (90 Base) MCG/ACT inhaler Inhale 2 puffs into the lungs every 8 (eight) hours.   ALPRAZolam (XANAX) 0.25 MG tablet Take 1 tablet (0.25 mg total) by mouth 2 (two) times daily as needed for anxiety.   amLODipine (NORVASC) 5 MG tablet Take 1 tablet (5 mg total) by mouth daily.   cetirizine (ZYRTEC) 10 MG tablet TAKE ONE TABLET BY MOUTH ONCE DAILY   donepezil (ARICEPT) 5 MG tablet Take 1 tablet (5 mg total) by mouth at bedtime.   furosemide (LASIX) 20 MG tablet Take 1 tablet (20 mg total) by mouth as needed for edema.   haloperidol (HALDOL) 0.5 MG tablet TAKE 1 TABLET BY MOUTH EVERY 8 HOURS AS NEEDED FOR  AGITATION   hydrochlorothiazide (MICROZIDE) 12.5 MG capsule Take 1 capsule (12.5 mg total) by mouth daily.   lidocaine (LIDODERM) 5 % Place 2 patches onto the skin daily. Remove & Discard patch within 12 hours or as directed by MD   losartan (COZAAR) 50 MG tablet Take 1 tablet by mouth once daily   mirtazapine (REMERON) 30 MG tablet Take 30 mg by mouth at bedtime.  VITAMIN D, CHOLECALCIFEROL, PO Take 1 tablet by mouth daily. gummy   XARELTO 15 MG TABS tablet TAKE 1 TABLET BY MOUTH ONCE DAILY WITH  SUPPER   albuterol (PROVENTIL) (2.5 MG/3ML) 0.083% nebulizer solution Take 3 mLs (2.5 mg total) by nebulization every 6 (six) hours as needed for wheezing or shortness of breath. (Patient not taking: Reported on 08/14/2021)   Capsaicin 0.075 % STCK .075% topically twice per day  (Patient not taking: Reported on 08/14/2021)   famotidine (PEPCID) 20 MG tablet Take 1 tablet (20 mg total) by mouth 2 (two) times daily. (Patient not taking: Reported on 08/14/2021)   NONFORMULARY OR COMPOUNDED ITEM Ketamine 10%, Baclofen 2%, Cyclobenzaprine 2%, Ketoprofen 10%, Gabapentin 6%, Lidocaine 1%, Amitryptiline 5% (Patient not taking: Reported on 08/14/2021)   pregabalin (LYRICA) 75 MG capsule Take 1 capsule by mouth twice daily (Patient not taking: Reported on 08/14/2021)   umeclidinium-vilanterol (ANORO ELLIPTA) 62.5-25 MCG/INH AEPB Inhale 1 puff into the lungs daily. (Patient not taking: Reported on 08/14/2021)   No facility-administered encounter medications on file as of 08/14/2021.    Allergies (verified) Dicyclomine hcl, Lisinopril, and Aspirin   History: Past Medical History:  Diagnosis Date   Arthritis    Atrial fibrillation (HCC)    Chronic neck pain    Diverticulosis of colon (without mention of hemorrhage)    Family history of malignant neoplasm of gastrointestinal tract    Gastritis    GERD (gastroesophageal reflux disease)    Hiatal hernia    HTN (hypertension)    LBP (low back pain)    Memory problem    Spondylosis    Thrombocytopenia (HCC)    Type II or unspecified type diabetes mellitus without mention of complication, not stated as uncontrolled    Past Surgical History:  Procedure Laterality Date   COLONOSCOPY  2010   NORMAL-- DUE 2020   PACEMAKER IMPLANT N/A 12/16/2020   Procedure: PACEMAKER IMPLANT;  Surgeon: Marinus Maw, MD;  Location: [facility];  Service: Cardiovascular;  Laterality: N/A;   TUBAL LIGATION     Family History  Problem Relation Age of Onset   Emphysema Brother    Stroke Father 13   Ovarian cancer Sister    Breast cancer Sister    Liver disease Brother    Colon cancer Daughter    Diabetes Daughter    Throat cancer Brother    Lung cancer Daughter    Social History   Socioeconomic History   Marital status: Widowed     Spouse name: deceased   Number of children: 4   Years of education: Not on file   Highest education level: Not on file  Occupational History   Occupation: Retired housekeeper  Tobacco Use   Smoking status: Never   Smokeless tobacco: Never  Substance and Sexual Activity   Alcohol use: No    Alcohol/week: 0.0 standard drinks   Drug use: No   Sexual activity: Not on file  Other Topics Concern   Not on file  Social History Narrative   Widow    4 children: 1 son '59; 3 dtrs '53, '62, '64; 9 grandchildren; great-grand   Lives with daughters, [name] & [name] in the home, grandson at home      Daily Caffeine Use:  1 cup   Regular Exercise -  NO   [name] visually impaired use braille   Son passed in 2016-17   Social Determinants of Health   Financial Resource Strain: Low Risk  Difficulty of Paying Living Expenses: Not hard at all  Food Insecurity: No Food Insecurity   Worried About Running Out of Food in the Last Year: Never true   Ran Out of Food in the Last Year: Never true  Transportation Needs: No Transportation Needs   Lack of Transportation (Medical): No   Lack of Transportation (Non-Medical): No  Physical Activity: Inactive   Days of Exercise per Week: 0 days   Minutes of Exercise per Session: 0 min  Stress: No Stress Concern Present   Feeling of Stress : Not at all  Social Connections: Socially Isolated   Frequency of Communication with Friends and Family: More than three times a week   Frequency of Social Gatherings with Friends and Family: More than three times a week   Attends Religious Services: Never   Database administrator or Organizations: No   Attends Banker Meetings: Never   Marital Status: Widowed    Tobacco Counseling Counseling given: Not Answered   Clinical Intake:  Pre-visit preparation completed: Yes  Pain : No/denies pain     BMI - recorded: 32.42 Nutritional Status: BMI > 30  Obese Nutritional Risks: Other (Comment)  (Poor appetite per daughter) Diabetes: Yes CBG done?: No Did pt. bring in CBG monitor from home?: No  How often do you need to have someone help you when you read instructions, pamphlets, or other written materials from your doctor or pharmacy?: 1 - Never What is the last grade level you completed in school?: 11th grade  Diabetic? yes  Interpreter Needed?: No  Information entered by :: Susie Cassette, LPN   Activities of Daily Living In your present state of health, do you have any difficulty performing the following activities: 08/14/2021 12/16/2020  Hearing? N N  Vision? N N  Difficulty concentrating or making decisions? Y N  Walking or climbing stairs? N Y  Dressing or bathing? N N  Doing errands, shopping? Y N  Preparing Food and eating ? N -  Using the Toilet? N -  In the past six months, have you accidently leaked urine? Y -  Do you have problems with loss of bowel control? N -  Managing your Medications? N -  Managing your Finances? N -  Housekeeping or managing your Housekeeping? N -  Some recent data might be hidden    Patient Care Team: Myrlene Broker, MD as PCP - General (Internal Medicine) Rollene Rotunda, MD as PCP - Cardiology (Cardiology) Kathyrn Sheriff, [facility] as Pharmacist (Pharmacist)  Indicate any recent Medical Services you may have received from other than [facility] providers in the past year (date may be approximate).     Assessment:   This is a routine wellness examination for Arnelle.  Hearing/Vision screen Hearing Screening - Comments:: Patient denied any hearing difficulty. Vision Screening - Comments:: Patient declined opthalmology referral.  Dietary issues and exercise activities discussed: Current Exercise Habits: The patient does not participate in regular exercise at present, Exercise limited by: cardiac condition(s);neurologic condition(s)   Goals Addressed   None   Depression Screen PHQ 2/9 Scores 08/14/2021 05/16/2021  03/15/2021 01/03/2021 12/09/2020 11/22/2020 11/10/2020  PHQ - 2 Score 0 0 2 0 0 0 0  PHQ- 9 Score - - 4 - - - -    Fall Risk Fall Risk  08/14/2021 05/16/2021 12/09/2020 11/22/2020 11/10/2020  Falls in the past year? 0 0 0 0 0  Number falls in past yr: 0 - - - 0  Injury with  Fall? 0 - - - 0  Risk for fall due to : No Fall Risks - - - Impaired balance/gait  Follow up Falls evaluation completed - - - Falls evaluation completed    FALL RISK PREVENTION PERTAINING TO THE HOME:  Any stairs in or around the home? No  If so, are there any without handrails? No  Home free of loose throw rugs in walkways, pet beds, electrical cords, etc? Yes  Adequate lighting in your home to reduce risk of falls? Yes   ASSISTIVE DEVICES UTILIZED TO PREVENT FALLS:  Life alert? No  Use of a cane, walker or w/c? No  Grab bars in the bathroom? No  Shower chair or bench in shower? Yes  Elevated toilet seat or a handicapped toilet? No   TIMED UP AND GO:  Was the test performed? No .  Length of time to ambulate 10 feet: n/a sec.   Gait steady and fast without use of assistive device  Cognitive Function:  Yes, Patient has current diagnosis of cognitive impairment.  MMSE - Mini Mental State Exam 06/23/2015  Orientation to time 4  Orientation to Place 4  Registration 3  Attention/ Calculation 0  Recall 2  Language- name 2 objects 2  Language- repeat 1  Language- follow 3 step command 3  Language- read & follow direction 0  Write a sentence 1  Copy design 0  Total score 20        Immunizations Immunization History  Administered Date(s) Administered   Fluad Quad(high Dose 65+) 09/11/2019, 10/26/2019, 11/10/2020   Influenza Whole 09/07/2005, 09/16/2008   Influenza, High Dose Seasonal PF 11/20/2016, 08/06/2017, 08/12/2018   Influenza,inj,Quad PF,6+ Mos 08/31/2013, 09/02/2014, 09/06/2015   Moderna Sars-Covid-2 Vaccination 02/03/2020, 03/10/2020   Pneumococcal Conjugate-13 03/24/2015   Pneumococcal  Polysaccharide-23 10/30/2006, 12/02/2019   Td 05/25/1996, 03/02/2002   Tdap 03/24/2015    TDAP status: Up to date  Flu Vaccine status: Up to date  Pneumococcal vaccine status: Up to date  Covid-19 vaccine status: Completed vaccines  Qualifies for Shingles Vaccine? Yes   Zostavax completed No   Shingrix Completed?: No.    Education has been provided regarding the importance of this vaccine. Patient has been advised to call insurance company to determine out of pocket expense if they have not yet received this vaccine. Advised may also receive vaccine at local pharmacy or Health Dept. Verbalized acceptance and understanding.  Screening Tests Health Maintenance  Topic Date Due   OPHTHALMOLOGY EXAM  Never done   Zoster Vaccines- Shingrix (1 of 2) Never done   COVID-19 Vaccine (3 - Moderna risk series) 04/07/2020   FOOT EXAM  09/10/2020   HEMOGLOBIN A1C  12/28/2020   INFLUENZA VACCINE  06/26/2021   TETANUS/TDAP  03/23/2025   DEXA SCAN  Completed   HPV VACCINES  Aged Out    Health Maintenance  Health Maintenance Due  Topic Date Due   OPHTHALMOLOGY EXAM  Never done   Zoster Vaccines- Shingrix (1 of 2) Never done   COVID-19 Vaccine (3 - Moderna risk series) 04/07/2020   FOOT EXAM  09/10/2020   HEMOGLOBIN A1C  12/28/2020   INFLUENZA VACCINE  06/26/2021    Colorectal cancer screening: No longer required.   Mammogram status: No longer required due to age.  Lung Cancer Screening: (Low Dose CT Chest recommended if Age 44-80 years, 30 pack-year currently smoking OR have quit w/in 15years.) does not qualify.   Lung Cancer Screening Referral: no  Additional Screening:  Hepatitis C  Screening: does not qualify; Completed no  Vision Screening: Recommended annual ophthalmology exams for early detection of glaucoma and other disorders of the eye. Is the patient up to date with their annual eye exam?  No  Who is the provider or what is the name of the office in which the patient  attends annual eye exams? Patient refused If pt is not established with a provider, would they like to be referred to a provider to establish care? No .   Dental Screening: Recommended annual dental exams for proper oral hygiene  Community Resource Referral / Chronic Care Management: CRR required this visit?  No   CCM required this visit?  No      Plan:     I have personally reviewed and noted the following in the patient's chart:   Medical and social history Use of alcohol, tobacco or illicit drugs  Current medications and supplements including opioid prescriptions.  Functional ability and status Nutritional status Physical activity Advanced directives List of other physicians Hospitalizations, surgeries, and ER visits in previous 12 months Vitals Screenings to include cognitive, depression, and falls Referrals and appointments  In addition, I have reviewed and discussed with patient certain preventive protocols, quality metrics, and best practice recommendations. A written personalized care plan for preventive services as well as general preventive health recommendations were provided to patient.     Mickeal Needy, LPN   0/01/7047   Nurse Notes:  There were no vitals filed for this visit. Weight was provided by daughter. Hearing Screening - Comments:: Patient denied any hearing difficulty. Vision Screening - Comments:: Patient declined opthalmology referral.

## 2021-08-14 NOTE — Patient Instructions (Signed)
Alyssa Lindsey , Thank you for taking time to come for your Medicare Wellness Visit. I appreciate your ongoing commitment to your health goals. Please review the following plan we discussed and let me know if I can assist you in the future.   Screening recommendations/referrals: Colonoscopy: Not a candidate for screening due to age Mammogram: Not a candidate for screening due to age Bone Density: Not a candidate for screening due to age Recommended yearly ophthalmology/optometry visit for glaucoma screening and checkup Recommended yearly dental visit for hygiene and checkup  Vaccinations: Influenza vaccine: 11/10/2020; due Fall 2022 Pneumococcal vaccine: 03/24/2015, 12/02/2019 Tdap vaccine: 03/24/2015; due every 10 years Shingles vaccine: never done   Covid-19: 02/03/2020, 03/10/2020  Advanced directives: Please bring a copy of your health care power of attorney and living will to the office at your convenience.  Conditions/risks identified: Yes; no goals at this time.  Next appointment: Please schedule your next Medicare Wellness Visit with your Nurse Health Advisor in 1 year by calling [phone].   Preventive Care 30 Years and Older, Female Preventive care refers to lifestyle choices and visits with your health care provider that can promote health and wellness. What does preventive care include? A yearly physical exam. This is also called an annual well check. Dental exams once or twice a year. Routine eye exams. Ask your health care provider how often you should have your eyes checked. Personal lifestyle choices, including: Daily care of your teeth and gums. Regular physical activity. Eating a healthy diet. Avoiding tobacco and drug use. Limiting alcohol use. Practicing safe sex. Taking low-dose aspirin every day. Taking vitamin and mineral supplements as recommended by your health care provider. What happens during an annual well check? The services and screenings done by your  health care provider during your annual well check will depend on your age, overall health, lifestyle risk factors, and family history of disease. Counseling  Your health care provider may ask you questions about your: Alcohol use. Tobacco use. Drug use. Emotional well-being. Home and relationship well-being. Sexual activity. Eating habits. History of falls. Memory and ability to understand (cognition). Work and work Astronomer. Reproductive health. Screening  You may have the following tests or measurements: Height, weight, and BMI. Blood pressure. Lipid and cholesterol levels. These may be checked every 5 years, or more frequently if you are over 57 years old. Skin check. Lung cancer screening. You may have this screening every year starting at age 77 if you have a 30-pack-year history of smoking and currently smoke or have quit within the past 15 years. Fecal occult blood test (FOBT) of the stool. You may have this test every year starting at age 53. Flexible sigmoidoscopy or colonoscopy. You may have a sigmoidoscopy every 5 years or a colonoscopy every 10 years starting at age 68. Hepatitis C blood test. Hepatitis B blood test. Sexually transmitted disease (STD) testing. Diabetes screening. This is done by checking your blood sugar (glucose) after you have not eaten for a while (fasting). You may have this done every 1-3 years. Bone density scan. This is done to screen for osteoporosis. You may have this done starting at age 98. Mammogram. This may be done every 1-2 years. Talk to your health care provider about how often you should have regular mammograms. Talk with your health care provider about your test results, treatment options, and if necessary, the need for more tests. Vaccines  Your health care provider may recommend certain vaccines, such as: Influenza vaccine. This is recommended  every year. Tetanus, diphtheria, and acellular pertussis (Tdap, Td) vaccine. You may  need a Td booster every 10 years. Zoster vaccine. You may need this after age 12. Pneumococcal 13-valent conjugate (PCV13) vaccine. One dose is recommended after age 49. Pneumococcal polysaccharide (PPSV23) vaccine. One dose is recommended after age 61. Talk to your health care provider about which screenings and vaccines you need and how often you need them. This information is not intended to replace advice given to you by your health care provider. Make sure you discuss any questions you have with your health care provider. Document Released: 12/09/2015 Document Revised: 08/01/2016 Document Reviewed: 09/13/2015 Elsevier Interactive Patient Education  2017 [employer] Prevention in the Home Falls can cause injuries. They can happen to people of all ages. There are many things you can do to make your home safe and to help prevent falls. What can I do on the outside of my home? Regularly fix the edges of walkways and driveways and fix any cracks. Remove anything that might make you trip as you walk through a door, such as a raised step or threshold. Trim any bushes or trees on the path to your home. Use bright outdoor lighting. Clear any walking paths of anything that might make someone trip, such as rocks or tools. Regularly check to see if handrails are loose or broken. Make sure that both sides of any steps have handrails. Any raised decks and porches should have guardrails on the edges. Have any leaves, snow, or ice cleared regularly. Use sand or salt on walking paths during winter. Clean up any spills in your garage right away. This includes oil or grease spills. What can I do in the bathroom? Use night lights. Install grab bars by the toilet and in the tub and shower. Do not use towel bars as grab bars. Use non-skid mats or decals in the tub or shower. If you need to sit down in the shower, use a plastic, non-slip stool. Keep the floor dry. Clean up any water that spills on  the floor as soon as it happens. Remove soap buildup in the tub or shower regularly. Attach bath mats securely with double-sided non-slip rug tape. Do not have throw rugs and other things on the floor that can make you trip. What can I do in the bedroom? Use night lights. Make sure that you have a light by your bed that is easy to reach. Do not use any sheets or blankets that are too big for your bed. They should not hang down onto the floor. Have a firm chair that has side arms. You can use this for support while you get dressed. Do not have throw rugs and other things on the floor that can make you trip. What can I do in the kitchen? Clean up any spills right away. Avoid walking on wet floors. Keep items that you use a lot in easy-to-reach places. If you need to reach something above you, use a strong step stool that has a grab bar. Keep electrical cords out of the way. Do not use floor polish or wax that makes floors slippery. If you must use wax, use non-skid floor wax. Do not have throw rugs and other things on the floor that can make you trip. What can I do with my stairs? Do not leave any items on the stairs. Make sure that there are handrails on both sides of the stairs and use them. Fix handrails that are broken  or loose. Make sure that handrails are as long as the stairways. Check any carpeting to make sure that it is firmly attached to the stairs. Fix any carpet that is loose or worn. Avoid having throw rugs at the top or bottom of the stairs. If you do have throw rugs, attach them to the floor with carpet tape. Make sure that you have a light switch at the top of the stairs and the bottom of the stairs. If you do not have them, ask someone to add them for you. What else can I do to help prevent falls? Wear shoes that: Do not have high heels. Have rubber bottoms. Are comfortable and fit you well. Are closed at the toe. Do not wear sandals. If you use a stepladder: Make sure  that it is fully opened. Do not climb a closed stepladder. Make sure that both sides of the stepladder are locked into place. Ask someone to hold it for you, if possible. Clearly mark and make sure that you can see: Any grab bars or handrails. First and last steps. Where the edge of each step is. Use tools that help you move around (mobility aids) if they are needed. These include: Canes. Walkers. Scooters. Crutches. Turn on the lights when you go into a dark area. Replace any light bulbs as soon as they burn out. Set up your furniture so you have a clear path. Avoid moving your furniture around. If any of your floors are uneven, fix them. If there are any pets around you, be aware of where they are. Review your medicines with your doctor. Some medicines can make you feel dizzy. This can increase your chance of falling. Ask your doctor what other things that you can do to help prevent falls. This information is not intended to replace advice given to you by your health care provider. Make sure you discuss any questions you have with your health care provider. Document Released: 09/08/2009 Document Revised: 04/19/2016 Document Reviewed: 12/17/2014 Elsevier Interactive Patient Education  2017 [employer].

## 2021-09-06 ENCOUNTER — Other Ambulatory Visit: Payer: Self-pay | Admitting: Internal Medicine

## 2021-09-20 ENCOUNTER — Ambulatory Visit (INDEPENDENT_AMBULATORY_CARE_PROVIDER_SITE_OTHER): Payer: Medicare HMO

## 2021-09-20 ENCOUNTER — Ambulatory Visit: Payer: Medicare HMO | Admitting: Internal Medicine

## 2021-09-20 DIAGNOSIS — I442 Atrioventricular block, complete: Secondary | ICD-10-CM | POA: Diagnosis not present

## 2021-09-20 LAB — CUP PACEART REMOTE DEVICE CHECK
Battery Remaining Longevity: 153 mo
Battery Voltage: 3.11 V
Brady Statistic RV Percent Paced: 63.68 %
Date Time Interrogation Session: 20221026013223
Implantable Lead Implant Date: 20220121
Implantable Lead Location: 753860
Implantable Lead Model: 3830
Implantable Pulse Generator Implant Date: 20220121
Lead Channel Impedance Value: 418 Ohm
Lead Channel Impedance Value: 589 Ohm
Lead Channel Pacing Threshold Amplitude: 0.75 V
Lead Channel Pacing Threshold Pulse Width: 0.4 ms
Lead Channel Sensing Intrinsic Amplitude: 17.125 mV
Lead Channel Sensing Intrinsic Amplitude: 17.125 mV
Lead Channel Setting Pacing Amplitude: 2.5 V
Lead Channel Setting Pacing Pulse Width: 0.4 ms
Lead Channel Setting Sensing Sensitivity: 0.9 mV

## 2021-09-25 NOTE — Progress Notes (Signed)
Remote pacemaker transmission.   

## 2021-09-27 ENCOUNTER — Other Ambulatory Visit: Payer: Self-pay

## 2021-09-27 ENCOUNTER — Encounter: Payer: Self-pay | Admitting: Internal Medicine

## 2021-09-27 ENCOUNTER — Ambulatory Visit (INDEPENDENT_AMBULATORY_CARE_PROVIDER_SITE_OTHER): Payer: Medicare HMO

## 2021-09-27 ENCOUNTER — Ambulatory Visit (INDEPENDENT_AMBULATORY_CARE_PROVIDER_SITE_OTHER): Payer: Medicare HMO | Admitting: Internal Medicine

## 2021-09-27 ENCOUNTER — Other Ambulatory Visit: Payer: Medicare HMO | Admitting: Hospice

## 2021-09-27 VITALS — BP 128/70 | HR 79 | Resp 18 | Ht 60.0 in | Wt 163.4 lb

## 2021-09-27 DIAGNOSIS — G301 Alzheimer's disease with late onset: Secondary | ICD-10-CM

## 2021-09-27 DIAGNOSIS — R0609 Other forms of dyspnea: Secondary | ICD-10-CM | POA: Diagnosis not present

## 2021-09-27 DIAGNOSIS — N1831 Chronic kidney disease, stage 3a: Secondary | ICD-10-CM

## 2021-09-27 DIAGNOSIS — Z0001 Encounter for general adult medical examination with abnormal findings: Secondary | ICD-10-CM

## 2021-09-27 DIAGNOSIS — Z23 Encounter for immunization: Secondary | ICD-10-CM | POA: Diagnosis not present

## 2021-09-27 DIAGNOSIS — J811 Chronic pulmonary edema: Secondary | ICD-10-CM | POA: Diagnosis not present

## 2021-09-27 DIAGNOSIS — E1142 Type 2 diabetes mellitus with diabetic polyneuropathy: Secondary | ICD-10-CM | POA: Diagnosis not present

## 2021-09-27 DIAGNOSIS — J9811 Atelectasis: Secondary | ICD-10-CM | POA: Diagnosis not present

## 2021-09-27 DIAGNOSIS — R69 Illness, unspecified: Secondary | ICD-10-CM | POA: Diagnosis not present

## 2021-09-27 DIAGNOSIS — Z515 Encounter for palliative care: Secondary | ICD-10-CM | POA: Diagnosis not present

## 2021-09-27 DIAGNOSIS — F03911 Unspecified dementia, unspecified severity, with agitation: Secondary | ICD-10-CM

## 2021-09-27 DIAGNOSIS — R0602 Shortness of breath: Secondary | ICD-10-CM | POA: Diagnosis not present

## 2021-09-27 DIAGNOSIS — F419 Anxiety disorder, unspecified: Secondary | ICD-10-CM

## 2021-09-27 LAB — COMPREHENSIVE METABOLIC PANEL
ALT: 6 U/L (ref 0–35)
AST: 14 U/L (ref 0–37)
Albumin: 4 g/dL (ref 3.5–5.2)
Alkaline Phosphatase: 75 U/L (ref 39–117)
BUN: 18 mg/dL (ref 6–23)
CO2: 30 mEq/L (ref 19–32)
Calcium: 8.7 mg/dL (ref 8.4–10.5)
Chloride: 100 mEq/L (ref 96–112)
Creatinine, Ser: 2.06 mg/dL — ABNORMAL HIGH (ref 0.40–1.20)
GFR: 20.84 mL/min — ABNORMAL LOW (ref >60.00)
Glucose, Bld: 135 mg/dL — ABNORMAL HIGH (ref 70–99)
Potassium: 3.3 mEq/L — ABNORMAL LOW (ref 3.5–5.1)
Sodium: 141 mEq/L (ref 135–145)
Total Bilirubin: 0.8 mg/dL (ref 0.2–1.2)
Total Protein: 7.4 g/dL (ref 6.0–8.3)

## 2021-09-27 LAB — CBC
HCT: 39 % (ref 36.0–46.0)
Hemoglobin: 12.6 g/dL (ref 12.0–15.0)
MCHC: 32.3 g/dL (ref 30.0–36.0)
MCV: 86.3 fl (ref 78.0–100.0)
Platelets: 154 10*3/uL (ref 150.0–400.0)
RBC: 4.52 Mil/uL (ref 3.87–5.11)
RDW: 15.2 % (ref 11.5–15.5)
WBC: 9.5 10*3/uL (ref 4.0–10.5)

## 2021-09-27 LAB — LIPID PANEL
Cholesterol: 154 mg/dL (ref 0–200)
HDL: 43 mg/dL (ref >39.00)
LDL Cholesterol: 88 mg/dL (ref 0–99)
NonHDL: 110.59
Total CHOL/HDL Ratio: 4
Triglycerides: 113 mg/dL (ref 0.0–149.0)
VLDL: 22.6 mg/dL (ref 0.0–40.0)

## 2021-09-27 LAB — BRAIN NATRIURETIC PEPTIDE: Pro B Natriuretic peptide (BNP): 304 pg/mL — ABNORMAL HIGH (ref 0.0–100.0)

## 2021-09-27 LAB — HEMOGLOBIN A1C: Hgb A1c MFr Bld: 6.3 % (ref 4.6–6.5)

## 2021-09-27 MED ORDER — ALBUTEROL SULFATE (2.5 MG/3ML) 0.083% IN NEBU: 2.5000 mg | INHALATION_SOLUTION | Freq: Four times a day (QID) | RESPIRATORY_TRACT | 1 refills | Status: AC | PRN

## 2021-09-27 MED ORDER — ALBUTEROL SULFATE HFA 108 (90 BASE) MCG/ACT IN AERS: 2.0000 | INHALATION_SPRAY | Freq: Three times a day (TID) | RESPIRATORY_TRACT | 2 refills | Status: AC

## 2021-09-27 MED ORDER — ALPRAZOLAM 0.25 MG PO TABS: 0.2500 mg | ORAL_TABLET | Freq: Two times a day (BID) | ORAL | 0 refills | Status: DC | PRN

## 2021-09-27 MED ORDER — TRIAMCINOLONE ACETONIDE 0.1 % EX CREA: 1.0000 "application " | TOPICAL_CREAM | Freq: Two times a day (BID) | CUTANEOUS | 0 refills | Status: DC

## 2021-09-27 NOTE — Patient Instructions (Addendum)
We will have you double the lasix to taking 2 pills daily for the next 5 days.  We are checking the labs and the chest x-ray.  We have sent in triamcinolone to use twice a day on the back.

## 2021-09-27 NOTE — Progress Notes (Signed)
   Subjective:   Patient ID: Alyssa Lindsey, female    DOB: [birth year], 87 y.o.   MRN: [id number]  HPI The patient is a 87 YO female coming in for physical and also new SOB on exertion for the last week.   Review of Systems  Constitutional: Negative.   HENT: Negative.    Eyes: Negative.   Respiratory:  Positive for shortness of breath. Negative for cough and chest tightness.   Cardiovascular:  Negative for chest pain, palpitations and leg swelling.  Gastrointestinal:  Negative for abdominal distention, abdominal pain, constipation, diarrhea, nausea and vomiting.  Musculoskeletal: Negative.   Skin: Negative.   Neurological: Negative.   Psychiatric/Behavioral: Negative.     Objective:  Physical Exam Constitutional:      Appearance: She is well-developed.  HENT:     Head: Normocephalic and atraumatic.  Cardiovascular:     Rate and Rhythm: Normal rate and regular rhythm.  Pulmonary:     Effort: Pulmonary effort is normal. No respiratory distress.     Breath sounds: Normal breath sounds. No wheezing or rales.  Abdominal:     General: Bowel sounds are normal. There is no distension.     Palpations: Abdomen is soft.     Tenderness: There is no abdominal tenderness. There is no rebound.  Musculoskeletal:     Cervical back: Normal range of motion.  Skin:    General: Skin is warm and dry.  Neurological:     Mental Status: She is alert and oriented to person, place, and time.     Coordination: Coordination normal.    Vitals:   09/27/21 0918  BP: 128/70  Pulse: 79  Resp: 18  SpO2: 99%  Weight: 163 lb 6.4 oz (74.1 kg)  Height: 5' (1.524 m)    This visit occurred during the SARS-CoV-2 public health emergency.  Safety protocols were in place, including screening questions prior to the visit, additional usage of staff PPE, and extensive cleaning of exam room while observing appropriate contact time as indicated for disinfecting solutions.   Assessment & Plan:  Flu shot given  at visit

## 2021-09-27 NOTE — Progress Notes (Signed)
Therapist, nutritional Palliative Care Consult Note Telephone: [phone]  Fax: [phone]  PATIENT NAME: Alyssa Lindsey DOB: 1929/01/25 MRN: [id number]  PRIMARY CARE PROVIDER:   Myrlene Broker, MD Myrlene Broker, MD [address] [place],  [place] [address]  REFERRING PROVIDER: Myrlene Broker, MD Myrlene Broker, MD [address] [place],  [place] [address]  RESPONSIBLE PARTY:   Self [facility]: [name] and Reine Just to call [name] [phone] Contact Information     Name Relation Home Work Mobile   [place] Daughter [phone]     Heffler,[name] Daughter [phone]  [phone]   Nieshia, Larmon Daughter   [phone]     TELEHEALTH VISIT STATEMENT Due to the COVID-19 crisis, this visit was done via telemedicine and it was initiated and consent by this patient and or family. Video-audio (telehealth) contact was unable to be done due to technical barriers from the patient's side.   Visit is to build trust and highlight Palliative Medicine as specialized medical care for people living with serious illness, aimed at facilitating better quality of life through symptoms relief, assisting with advance care planning and complex medical decision making.  [name] is with patient during visit. This is a follow up visit.  RECOMMENDATIONS/PLAN:   Advance Care Planning/Code Status: Patient is currently a Full code. Family will discuss if and when to change it.   Goals of Care: Goals of care include to maximize quality of life and symptom management.  Visit consisted of counseling and education dealing with the complex and emotionally intense issues of symptom management and palliative care in the setting of serious and potentially life-threatening illness. Palliative care team will continue to support patient, patient's family, and medical team.  Symptom management/Plan:  Dementia: memory loss and confusion is ongoing at  baseline.  FAST 6C. PPS 50% . Safety and Fall precautions in place.  Continue Aricept and ongoing supportive care. Encouraged reminiscence, word puzzle/search/Bingo HTN: Managed with Amlodipine, Losartan. Agitation: De escalation techniques; redirection. Haldol as ordered Anxiety: Managed with Lorazepam   Follow up: Palliative care will continue to follow for complex medical decision making, advance care planning, and clarification of goals. Return 6 weeks or prn. Encouraged to call provider sooner with any concerns.   CHIEF COMPLAINT: Palliative follow up   HISTORY OF PRESENT ILLNESS:  Alyssa Lindsey a 87 y.o. female with multiple medical problems including Dementia, hypertension, A. fib, agitation, anxiety, weight loss.  Patient denies pain/discomfort, no fever/chills, endorses colds for which she saw her PCP who sent in a prescription for her to go pick up today; Xray was negative. She denies respiratory distress.  History obtained from review of EMR, discussion with primary team, family and/or patient. Records reviewed and summarized above. All 10 point systems reviewed and are negative except as documented in history of present illness above  Review and summarization of Epic records shows history from other than patient.   Palliative Care was asked to follow this patient o help address complex decision making in the context of advance care planning and goals of care clarification.   PERTINENT MEDICATIONS:  Outpatient Encounter Medications as of 09/27/2021  Medication Sig   acetaminophen (TYLENOL) 650 MG CR tablet Take 1,300 mg by mouth every 8 (eight) hours as needed for pain.   albuterol (PROVENTIL) (2.5 MG/3ML) 0.083% nebulizer solution Take 3 mLs (2.5 mg total) by nebulization every 6 (six) hours as needed for wheezing or shortness of breath.   albuterol (  VENTOLIN HFA) 108 (90 Base) MCG/ACT inhaler Inhale 2 puffs into the lungs every 8 (eight) hours.   ALPRAZolam (XANAX) 0.25 MG tablet  Take 1 tablet (0.25 mg total) by mouth 2 (two) times daily as needed for anxiety.   amLODipine (NORVASC) 5 MG tablet Take 1 tablet (5 mg total) by mouth daily.   Capsaicin 0.075 % STCK .075% topically twice per day   cetirizine (ZYRTEC) 10 MG tablet TAKE ONE TABLET BY MOUTH ONCE DAILY   donepezil (ARICEPT) 5 MG tablet TAKE 1 TABLET BY MOUTH AT BEDTIME   famotidine (PEPCID) 20 MG tablet Take 1 tablet (20 mg total) by mouth 2 (two) times daily.   furosemide (LASIX) 20 MG tablet Take 1 tablet (20 mg total) by mouth as needed for edema.   haloperidol (HALDOL) 0.5 MG tablet TAKE 1 TABLET BY MOUTH EVERY 8 HOURS AS NEEDED FOR AGITATION   hydrochlorothiazide (MICROZIDE) 12.5 MG capsule Take 1 capsule (12.5 mg total) by mouth daily.   lidocaine (LIDODERM) 5 % Place 2 patches onto the skin daily. Remove & Discard patch within 12 hours or as directed by MD   losartan (COZAAR) 50 MG tablet Take 1 tablet by mouth once daily   mirtazapine (REMERON) 30 MG tablet Take 30 mg by mouth at bedtime.   NONFORMULARY OR COMPOUNDED ITEM Ketamine 10%, Baclofen 2%, Cyclobenzaprine 2%, Ketoprofen 10%, Gabapentin 6%, Lidocaine 1%, Amitryptiline 5%   pregabalin (LYRICA) 75 MG capsule Take 1 capsule by mouth twice daily   triamcinolone cream (KENALOG) 0.1 % Apply 1 application topically 2 (two) times daily.   umeclidinium-vilanterol (ANORO ELLIPTA) 62.5-25 MCG/INH AEPB Inhale 1 puff into the lungs daily.   VITAMIN D, CHOLECALCIFEROL, PO Take 1 tablet by mouth daily. gummy   XARELTO 15 MG TABS tablet TAKE 1 TABLET BY MOUTH ONCE DAILY WITH  SUPPER   No facility-administered encounter medications on file as of 09/27/2021.    HOSPICE ELIGIBILITY/DIAGNOSIS: TBD  PAST MEDICAL HISTORY:  Past Medical History:  Diagnosis Date   Arthritis    Atrial fibrillation (HCC)    Chronic neck pain    Diverticulosis of colon (without mention of hemorrhage)    Family history of malignant neoplasm of gastrointestinal tract    Gastritis     GERD (gastroesophageal reflux disease)    Hiatal hernia    HTN (hypertension)    LBP (low back pain)    Memory problem    Spondylosis    Thrombocytopenia (HCC)    Type II or unspecified type diabetes mellitus without mention of complication, not stated as uncontrolled       ALLERGIES:  Allergies  Allergen Reactions   Dicyclomine Hcl Other (See Comments)    Severe eye pain   Lisinopril Cough   Aspirin Other (See Comments)    Unknown reaction      I spent 40 minutes providing this consultation; this includes time spent with patient/family, chart review and documentation. More than 50% of the time in this consultation was spent on counseling and coordinating communication   Thank you for the opportunity to participate in the care of Alyssa Lindsey Please call our office at [phone] if we can be of additional assistance.  Note: Portions of this note were generated with Scientist, clinical (histocompatibility and immunogenetics). Dictation errors may occur despite best attempts at proofreading.  Rosaura Carpenter, NP

## 2021-09-29 NOTE — Assessment & Plan Note (Signed)
Foot exam done and checking HgA1c and adjust as needed. She is diet controlled currently.

## 2021-09-29 NOTE — Assessment & Plan Note (Signed)
Checking CXR and CBC and CMP today. If fluid in lungs will increase lasix. No pneumonia on exam clinically.

## 2021-09-29 NOTE — Assessment & Plan Note (Signed)
Flu shot given at visit. Covid-19 booster recommended. Pneumonia complete. Shingrix counseled. Tetanus due 2026. Colonoscopy aged out. Mammogram aged out, pap smear aged out and dexa no further needed. Counseled about sun safety and mole surveillance. Counseled about the dangers of distracted driving. Given 10 year screening recommendations.

## 2021-09-29 NOTE — Assessment & Plan Note (Signed)
Checking CMP.  

## 2021-10-22 ENCOUNTER — Encounter: Payer: Self-pay | Admitting: Internal Medicine

## 2021-10-23 ENCOUNTER — Ambulatory Visit: Payer: Medicare HMO | Admitting: Primary Care

## 2021-10-23 ENCOUNTER — Other Ambulatory Visit: Payer: Self-pay

## 2021-10-23 ENCOUNTER — Encounter: Payer: Self-pay | Admitting: Primary Care

## 2021-10-23 VITALS — BP 126/72 | HR 88 | Temp 97.9°F | Ht 65.0 in | Wt 164.4 lb

## 2021-10-23 DIAGNOSIS — R0602 Shortness of breath: Secondary | ICD-10-CM | POA: Diagnosis not present

## 2021-10-23 DIAGNOSIS — R0609 Other forms of dyspnea: Secondary | ICD-10-CM | POA: Diagnosis not present

## 2021-10-23 LAB — BASIC METABOLIC PANEL
BUN: 15 mg/dL (ref 6–23)
CO2: 28 mEq/L (ref 19–32)
Calcium: 9.6 mg/dL (ref 8.4–10.5)
Chloride: 105 mEq/L (ref 96–112)
Creatinine, Ser: 1.64 mg/dL — ABNORMAL HIGH (ref 0.40–1.20)
GFR: 27.39 mL/min — ABNORMAL LOW (ref >60.00)
Glucose, Bld: 123 mg/dL — ABNORMAL HIGH (ref 70–99)
Potassium: 3.8 mEq/L (ref 3.5–5.1)
Sodium: 142 mEq/L (ref 135–145)

## 2021-10-23 LAB — BRAIN NATRIURETIC PEPTIDE: Pro B Natriuretic peptide (BNP): 266 pg/mL — ABNORMAL HIGH (ref 0.0–100.0)

## 2021-10-23 LAB — D-DIMER, QUANTITATIVE: D-Dimer, Quant: 0.66 mcg/mL FEU — ABNORMAL HIGH (ref <0.50)

## 2021-10-23 NOTE — Progress Notes (Signed)
Her GFR is not great so I dont think she will be able to do CTA. She needs VQ scan r/o PE and HRCT with ILD protocol for hx bronchiectasis, suspected pulmonary fibrosis

## 2021-10-23 NOTE — Progress Notes (Signed)
@Patient  ID: Alyssa Lindsey, female    DOB: Apr 14, 1929, 87 y.o.   MRN: [id number]  Chief Complaint  Patient presents with   Follow-up    Pt states SOB with exertion    Referring provider: Hoyt Koch, *   Brief patient profile:  66 yowf never smoker with new onset doe Dec 2020  Assoc with shingles s obvious explanation with neg w/u by Cards (Dr Percival Spanish who d/c'd cardizem due to bradycardia 11/08/20)  with restrictive changes only by pfts 12/13/20 so referred to pulmonary clinic 12/15/2020 by Dr   Sharlet Salina with pulse rate  In low 30's on initial eval.  HPI: 87 year old female, never smoked. PMH significant for HTN, afib, aneurysm of pulmonary artery, GERD, type 2 diabetes, alzheimer's dementia, stage 3 CKD. Patient of Dr. Melvyn Novas, last seen on 12/15/20 for dyspnea on exertion.  Previous [facility] pulmonary encounter: 12/15/2020  Pulmonary/ 1st office eval/[facility]  Chief Complaint  Patient presents with   Consult    Shortness of breath at rest at times and with activity   Dyspnea:  Room to room since oct/nov assoc with wheezing  Cough: none  Sleep:  Bed is flat/ 2 pillows SABA use: does not have neb machined, just the solution, worse p saba for pfts, worse started anoro.  No obvious day to day or daytime variability or assoc excess/ purulent sputum or mucus plugs or hemoptysis or cp or chest tightness  or overt sinus or hb symptoms.   Sleeping as above  without nocturnal  or early am exacerbation  of respiratory  c/o's or need for noct saba. Also denies any obvious fluctuation of symptoms with weather or environmental changes or other aggravating or alleviating factors except as outlined above   No unusual exposure hx or h/o childhood pna/ asthma or knowledge of premature birth.  Current Allergies, Complete Past Medical History, Past Surgical History, Family History, and Social History were reviewed in [employer] record.  ROS  The following are not active  complaints unless bolded Hoarseness, sore throat, dysphagia, dental problems, itching, sneezing,  nasal congestion or discharge of excess mucus or purulent secretions, ear ache,   fever, chills, sweats, unintended wt loss or wt gain, classically pleuritic or exertional cp,  orthopnea pnd or arm/hand swelling  or leg swelling, presyncope, palpitations, abdominal pain, anorexia, nausea, vomiting, diarrhea  or change in bowel habits or change in bladder habits, change in stools or change in urine, dysuria, hematuria,  rash, arthralgias, visual complaints, headache, numbness, weakness or ataxia or problems with walking or coordination,  change in mood or  memory.          10/23/2021- Interim hx  Patient presents today for overdue follow-up. During her last visit with Dr. Melvyn Novas in January 2022 she was in complete heart block and transported to [facility] via EMS. She saw PCP on 11/2 and reported symptoms of dyspnea with exertion, BNP was elevated at 304 and CXR showed enlargement of cardiac silhouette with pulmonary vascular congestion with minimal subsegmental atelectasis right base other wise lungs were clear.  PCP increased lasix for 2-3 days.   She is accompanied by her granddaughter today and her daughter is on the phone. She has noted some shortness of breath over the last several months. She becomes dyspneic walking from the kitchen to another room. She can not stand up for very long and needs to rest. She has a np congested cough. No chest tightness or wheezing. She is no  longer on ANORO. She was a never smoker. PFTs have shown restriction. Albuterol does help. No leg swelling.   Allergies  Allergen Reactions   Dicyclomine Hcl Other (See Comments)    Severe eye pain   Lisinopril Cough   Aspirin Other (See Comments)    Unknown reaction    Immunization History  Administered Date(s) Administered   Fluad Quad(high Dose 65+) 09/11/2019, 10/26/2019, 11/10/2020, 09/27/2021   Influenza Whole 09/07/2005,  09/16/2008   Influenza, High Dose Seasonal PF 11/20/2016, 08/06/2017, 08/12/2018   Influenza,inj,Quad PF,6+ Mos 08/31/2013, 09/02/2014, 09/06/2015   Moderna Sars-Covid-2 Vaccination 02/03/2020, 03/10/2020   Pneumococcal Conjugate-13 03/24/2015   Pneumococcal Polysaccharide-23 10/30/2006, 12/02/2019   Td 05/25/1996, 03/02/2002   Tdap 03/24/2015    Past Medical History:  Diagnosis Date   Arthritis    Atrial fibrillation (HCC)    Chronic neck pain    Diverticulosis of colon (without mention of hemorrhage)    Family history of malignant neoplasm of gastrointestinal tract    Gastritis    GERD (gastroesophageal reflux disease)    Hiatal hernia    HTN (hypertension)    LBP (low back pain)    Memory problem    Spondylosis    Thrombocytopenia (HCC)    Type II or unspecified type diabetes mellitus without mention of complication, not stated as uncontrolled     Tobacco History: Social History   Tobacco Use  Smoking Status Never  Smokeless Tobacco Never   Counseling given: Not Answered   Outpatient Medications Prior to Visit  Medication Sig Dispense Refill   acetaminophen (TYLENOL) 650 MG CR tablet Take 1,300 mg by mouth every 8 (eight) hours as needed for pain.     albuterol (PROVENTIL) (2.5 MG/3ML) 0.083% nebulizer solution Take 3 mLs (2.5 mg total) by nebulization every 6 (six) hours as needed for wheezing or shortness of breath. 150 mL 1   albuterol (VENTOLIN HFA) 108 (90 Base) MCG/ACT inhaler Inhale 2 puffs into the lungs every 8 (eight) hours. 8 g 2   ALPRAZolam (XANAX) 0.25 MG tablet Take 1 tablet (0.25 mg total) by mouth 2 (two) times daily as needed for anxiety. 60 tablet 0   amLODipine (NORVASC) 5 MG tablet Take 1 tablet (5 mg total) by mouth daily. 90 tablet 3   Capsaicin 0.075 % STCK .075% topically twice per day (Patient not taking: Reported on 10/23/2021) 1 g 1   cetirizine (ZYRTEC) 10 MG tablet TAKE ONE TABLET BY MOUTH ONCE DAILY 30 tablet 11   donepezil (ARICEPT) 5  MG tablet TAKE 1 TABLET BY MOUTH AT BEDTIME 90 tablet 0   famotidine (PEPCID) 20 MG tablet Take 1 tablet (20 mg total) by mouth 2 (two) times daily. (Patient not taking: Reported on 10/23/2021) 60 tablet 2   furosemide (LASIX) 20 MG tablet Take 1 tablet (20 mg total) by mouth as needed for edema. 90 tablet 3   haloperidol (HALDOL) 0.5 MG tablet TAKE 1 TABLET BY MOUTH EVERY 8 HOURS AS NEEDED FOR AGITATION 90 tablet 0   hydrochlorothiazide (MICROZIDE) 12.5 MG capsule Take 1 capsule (12.5 mg total) by mouth daily. 90 capsule 0   lidocaine (LIDODERM) 5 % Place 2 patches onto the skin daily. Remove & Discard patch within 12 hours or as directed by MD (Patient taking differently: Place 2 patches onto the skin daily. Remove & Discard patch within 12 hours or as directed by MD prn) 60 patch 0   losartan (COZAAR) 50 MG tablet Take 1 tablet by mouth once daily  90 tablet 3   mirtazapine (REMERON) 30 MG tablet Take 30 mg by mouth at bedtime.     NONFORMULARY OR COMPOUNDED ITEM Ketamine 10%, Baclofen 2%, Cyclobenzaprine 2%, Ketoprofen 10%, Gabapentin 6%, Lidocaine 1%, Amitryptiline 5% 1 each 1   pregabalin (LYRICA) 75 MG capsule Take 1 capsule by mouth twice daily (Patient not taking: Reported on 10/23/2021) 60 capsule 0   triamcinolone cream (KENALOG) 0.1 % Apply 1 application topically 2 (two) times daily. (Patient not taking: Reported on 10/23/2021) 100 g 0   umeclidinium-vilanterol (ANORO ELLIPTA) 62.5-25 MCG/INH AEPB Inhale 1 puff into the lungs daily. (Patient not taking: Reported on 10/23/2021) 30 each 3   VITAMIN D, CHOLECALCIFEROL, PO Take 1 tablet by mouth daily. gummy (Patient not taking: Reported on 10/23/2021)     XARELTO 15 MG TABS tablet TAKE 1 TABLET BY MOUTH ONCE DAILY WITH  SUPPER 60 tablet 5   No facility-administered medications prior to visit.      Review of Systems  Review of Systems  Constitutional: Negative.   HENT:  Positive for congestion.   Respiratory:  Positive for cough and  shortness of breath. Negative for chest tightness and wheezing.     Physical Exam  BP 126/72 (BP Location: Left Arm, Patient Position: Sitting, Cuff Size: Normal)   Pulse 88   Temp 97.9 F (36.6 C) (Oral)   Ht 5\' 5"  (1.651 m)   Wt 164 lb 6.4 oz (74.6 kg)   SpO2 98%   BMI 27.36 kg/m  Physical Exam Constitutional:      Appearance: Normal appearance.  HENT:     Head: Normocephalic and atraumatic.     Mouth/Throat:     Mouth: Mucous membranes are moist.     Pharynx: Oropharynx is clear.  Cardiovascular:     Rate and Rhythm: Normal rate. Rhythm irregular.     Comments: No edema  Pulmonary:     Effort: Pulmonary effort is normal.     Breath sounds: Normal breath sounds.     Comments: Crackles lung bases, R> L Musculoskeletal:        General: Normal range of motion.  Skin:    General: Skin is warm and dry.  Neurological:     General: No focal deficit present.     Mental Status: She is alert and oriented to person, place, and time. Mental status is at baseline.  Psychiatric:        Mood and Affect: Mood normal.        Behavior: Behavior normal.        Thought Content: Thought content normal.        Judgment: Judgment normal.     Lab Results:  CBC    Component Value Date/Time   WBC 9.5 09/27/2021 1012   RBC 4.52 09/27/2021 1012   HGB 12.6 09/27/2021 1012   HGB 12.2 11/08/2020 1516   HCT 39.0 09/27/2021 1012   HCT 38.0 11/08/2020 1516   PLT 154.0 09/27/2021 1012   PLT CANCELED 11/08/2020 1516   MCV 86.3 09/27/2021 1012   MCV 84 11/08/2020 1516   MCH 26.6 03/14/2021 1155   MCHC 32.3 09/27/2021 1012   RDW 15.2 09/27/2021 1012   RDW 14.6 11/08/2020 1516   LYMPHSABS 1.3 03/14/2021 1155   MONOABS 0.6 03/14/2021 1155   EOSABS 0.1 03/14/2021 1155   BASOSABS 0.0 03/14/2021 1155    BMET    Component Value Date/Time   NA 141 09/27/2021 1012   NA 143 11/08/2020 1516  K 3.3 (L) 09/27/2021 1012   CL 100 09/27/2021 1012   CO2 30 09/27/2021 1012   GLUCOSE 135 (H)  09/27/2021 1012   BUN 18 09/27/2021 1012   BUN 18 11/08/2020 1516   CREATININE 2.06 (H) 09/27/2021 1012   CREATININE 1.59 (H) 06/27/2020 1346   CALCIUM 8.7 09/27/2021 1012   GFRNONAA 37 (L) 03/14/2021 1155   GFRAA 34 (L) 11/08/2020 1516    BNP    Component Value Date/Time   BNP 699.0 (H) 12/15/2020 1729    ProBNP    Component Value Date/Time   PROBNP 304.0 (H) 09/27/2021 1012    Imaging: DG Chest 2 View  Result Date: 09/27/2021 CLINICAL DATA:  Shortness of breath and dyspnea on exertion for 1 week, history atrial fibrillation, hypertension, diabetes mellitus, pacemaker EXAM: CHEST - 2 VIEW COMPARISON:  03/14/2021 FINDINGS: LEFT subclavian pacemaker with lead projecting over RIGHT ventricle. Enlargement of cardiac silhouette with pulmonary vascular congestion. Mediastinal contours normal. Minimal subsegmental atelectasis at RIGHT base. Lungs otherwise clear. No pulmonary infiltrate, pleural effusion, or pneumothorax. No acute osseous findings. IMPRESSION: Enlargement of cardiac silhouette with pulmonary vascular congestion post pacemaker. Minimal subsegmental atelectasis RIGHT lung base. Electronically Signed   By: Lavonia Dana M.D.   On: 09/27/2021 14:59     Assessment & Plan:   Dyspnea on exertion - Patient reports having dyspnea symptoms for the last 6 months or longer. She has an associated np congested cough. No chest tightness or wheezing. PFTs in past showed restrictive defect. CXR in November 2022 with pulmonary vascular congestion and atelectasis right base. Symptoms likely related to HF but concern for underlying ILD based on restrictive defect seen on PFTs and traction bronchiectasis on CT imaging in 2021. We will recheck BNP today and get D-DIMER. She takes lasix 20mg  daily, may need to adjust diuretics. She will need advanced imaging either CTA or HRCT. FU first available with Dr. Melvyn Novas.    Martyn Ehrich, NP 10/23/2021

## 2021-10-23 NOTE — Addendum Note (Signed)
Addended by: Glenford Bayley on: 10/23/2021 09:41 AM   Modules accepted: Orders

## 2021-10-23 NOTE — Assessment & Plan Note (Signed)
-   Patient reports having dyspnea symptoms for the last 6 months or longer. She has an associated np congested cough. No chest tightness or wheezing. PFTs in past showed restrictive defect. CXR in November 2022 with pulmonary vascular congestion and atelectasis right base. Symptoms likely related to HF but concern for underlying ILD based on restrictive defect seen on PFTs and traction bronchiectasis on CT imaging in 2021. We will recheck BNP today and get D-DIMER. She takes lasix 20mg  daily, may need to adjust diuretics. She will need advanced imaging either CTA or HRCT. FU first available with Dr. .

## 2021-10-23 NOTE — Progress Notes (Signed)
Please let patient know D-dimer was mildly elevated, please order CTA stat to rule out PE.

## 2021-10-23 NOTE — Patient Instructions (Addendum)
We will get labs today Depending on results we will get CT imaging   Recommendations: Take mucinex 600mg  twice a day Use albuterol rescue inhaler 2 puffs every 6 hours as needed for shortness of breath/wheezing   Follow-up: First available with Dr. 

## 2021-10-24 ENCOUNTER — Ambulatory Visit: Payer: Medicare HMO

## 2021-10-24 ENCOUNTER — Other Ambulatory Visit: Payer: Self-pay

## 2021-10-24 DIAGNOSIS — R0609 Other forms of dyspnea: Secondary | ICD-10-CM

## 2021-10-29 ENCOUNTER — Other Ambulatory Visit: Payer: Self-pay | Admitting: Internal Medicine

## 2021-10-30 ENCOUNTER — Other Ambulatory Visit: Payer: Self-pay | Admitting: *Deleted

## 2021-10-31 MED ORDER — FUROSEMIDE 20 MG PO TABS
20.0000 mg | ORAL_TABLET | ORAL | 1 refills | Status: DC | PRN
Start: 2021-10-31 — End: 2022-02-06

## 2021-11-01 ENCOUNTER — Other Ambulatory Visit: Payer: Self-pay | Admitting: Internal Medicine

## 2021-11-02 ENCOUNTER — Ambulatory Visit (HOSPITAL_COMMUNITY)
Admission: RE | Admit: 2021-11-02 | Discharge: 2021-11-02 | Disposition: A | Discharge status: Home / Self Care | Payer: Medicare HMO | Source: Ambulatory Visit | Attending: Primary Care | Admitting: Primary Care

## 2021-11-02 ENCOUNTER — Other Ambulatory Visit: Payer: Self-pay

## 2021-11-02 ENCOUNTER — Encounter (HOSPITAL_COMMUNITY)
Admission: RE | Admit: 2021-11-02 | Discharge: 2021-11-02 | Disposition: A | Discharge status: Home / Self Care | Payer: Medicare HMO | Source: Ambulatory Visit | Attending: Primary Care | Admitting: Primary Care

## 2021-11-02 DIAGNOSIS — R0609 Other forms of dyspnea: Secondary | ICD-10-CM | POA: Diagnosis not present

## 2021-11-02 DIAGNOSIS — R0789 Other chest pain: Secondary | ICD-10-CM | POA: Diagnosis not present

## 2021-11-02 MED ORDER — TECHNETIUM TO 99M ALBUMIN AGGREGATED
3.8000 | Freq: Once | INTRAVENOUS | Status: AC
  Administered 2021-11-02: 3.8 via INTRAVENOUS

## 2021-11-02 NOTE — Progress Notes (Signed)
CXR showed no active cardiopulmonary disease. I believe this was for VQ scan so we do not need to call her with results. She should also be having HRCT (this has been ordered) at some point as well to evaluate coarsened interstitial lung markings

## 2021-11-03 NOTE — Progress Notes (Signed)
Please let patient know testing was negative for PE

## 2021-11-28 ENCOUNTER — Other Ambulatory Visit: Payer: Self-pay | Admitting: Internal Medicine

## 2021-11-29 ENCOUNTER — Other Ambulatory Visit: Payer: Self-pay

## 2021-11-29 ENCOUNTER — Ambulatory Visit: Payer: Medicare HMO | Admitting: Internal Medicine

## 2021-11-29 ENCOUNTER — Encounter: Payer: Self-pay | Admitting: Internal Medicine

## 2021-11-29 DIAGNOSIS — R0609 Other forms of dyspnea: Secondary | ICD-10-CM | POA: Diagnosis not present

## 2021-11-29 NOTE — Progress Notes (Signed)
Alyssa Lindsey, female    DOB: 09/02/1929    MRN: [id number]   Brief patient profile:  [id number]  yowf never smoker with new onset doe Dec 2020  Assoc with shingles s obvious explanation with neg w/u by Cards (Dr Alyssa Lindsey who d/c'd cardizem due to bradycardia 11/08/20)  with restrictive changes only by pfts 12/13/20 so referred to pulmonary clinic 12/15/2020 by Dr  Alyssa Lindsey with pulse rate  In low 30's on initial eval.    History of Present Illness  12/15/2020  Pulmonary/ 1st office eval/Alyssa Lindsey  Chief Complaint  Patient presents with   Consult    Shortness of breath at rest at times and with activity   Dyspnea:  Room to room since oct/nov assoc with wheezing  Cough: none  Sleep:  Bed is flat/ 2 pillows SABA use: does not have neb machined, just the solution, worse p saba for pfts, worse started anoro Rec To ER for complete heart block    11/29/2021  f/u ov/Alyssa Lindsey re: doe   maint on no pulmonary  meds/ not taking pepcid as rec  No chief complaint on file.  Dyspnea:  room to room  Cough: none  Sleeping: flat bed  SABA use: has hfa only not neb 02: none       No obvious day to day or daytime variability or assoc excess/ purulent sputum or mucus plugs or hemoptysis or cp or chest tightness, subjective wheeze or overt sinus or hb symptoms.   Sleeping  without nocturnal  or early am exacerbation  of respiratory  c/o's or need for noct saba. Also denies any obvious fluctuation of symptoms with weather or environmental changes or other aggravating or alleviating factors except as outlined above   No unusual exposure hx or h/o childhood pna/ asthma or knowledge of premature birth.  Current Allergies, Complete Past Medical History, Past Surgical History, Family History, and Social History were reviewed in [facility] electronic medical record.  ROS  The following are not active complaints unless bolded Hoarseness, sore throat, dysphagia, dental problems, itching, sneezing,  nasal congestion  or discharge of excess mucus or purulent secretions, ear ache,   fever, chills, sweats, unintended wt loss or wt gain, classically pleuritic or exertional cp,  orthopnea pnd or arm/hand swelling  or leg swelling, presyncope, palpitations, abdominal pain, anorexia, nausea, vomiting, diarrhea  or change in bowel habits or change in bladder habits, change in stools or change in urine, dysuria, hematuria,  rash, arthralgias, visual complaints, headache, numbness, weakness or ataxia or problems with walking or coordination,  change in mood or  memory.        Current Meds  Medication Sig   acetaminophen (TYLENOL) 650 MG CR tablet Take 1,300 mg by mouth every 8 (eight) hours as needed for pain.        albuterol (VENTOLIN HFA) 108 (90 Base) MCG/ACT inhaler Inhale 2 puffs into the lungs every 8 (eight) hours.   ALPRAZolam (XANAX) 0.25 MG tablet Take 1 tablet by mouth twice daily as needed for anxiety   Capsaicin 0.075 % STCK .075% topically twice per day   cetirizine (ZYRTEC) 10 MG tablet TAKE ONE TABLET BY MOUTH ONCE DAILY   donepezil (ARICEPT) 5 MG tablet TAKE 1 TABLET BY MOUTH AT BEDTIME   famotidine (PEPCID) 20 MG tablet Take 1 tablet (20 mg total) by mouth 2 (two) times daily.   furosemide (LASIX) 20 MG tablet Take 1 tablet (20 mg total) by mouth as needed for edema.  haloperidol (HALDOL) 0.5 MG tablet TAKE 1 TABLET BY MOUTH EVERY 8 HOURS AS NEEDED FOR  AGITATION   hydrochlorothiazide (MICROZIDE) 12.5 MG capsule Take 1 capsule (12.5 mg total) by mouth daily.   lidocaine (LIDODERM) 5 % Place 2 patches onto the skin daily. Remove & Discard patch within 12 hours or as directed by MD (Patient taking differently: Place 2 patches onto the skin daily. Remove & Discard patch within 12 hours or as directed by MD prn)   losartan (COZAAR) 50 MG tablet Take 1 tablet by mouth once daily   mirtazapine (REMERON) 30 MG tablet Take 30 mg by mouth at bedtime.   VITAMIN D, CHOLECALCIFEROL, PO Take 1 tablet by mouth  daily. gummy   XARELTO 15 MG TABS tablet TAKE 1 TABLET BY MOUTH ONCE DAILY WITH  SUPPER                  Past Medical History:  Diagnosis Date   Arthritis    Chronic neck pain    Diverticulosis of colon (without mention of hemorrhage)    Family history of malignant neoplasm of gastrointestinal tract    Gastritis    Hiatal hernia    HTN (hypertension)    LBP (low back pain)    Memory problem    Spondylosis    Type II or unspecified type diabetes mellitus without mention of complication, not stated as uncontrolled        Objective:     Wt Readings from Last 3 Encounters:  11/29/21 157 lb (71.2 kg)  10/23/21 164 lb 6.4 oz (74.6 kg)  09/27/21 163 lb 6.4 oz (74.1 kg)      Vital signs reviewed  11/29/2021  - Note at rest 02 sats  99% on RA   General appearance:    w/c bound elderly bf nad      HEENT : pt wearing mask not removed for exam due to covid -19 concerns.    NECK :  without JVD/Nodes/TM/ nl carotid upstrokes bilaterally   LUNGS: no acc muscle use,  Nl contour chest which is clear to A and P bilaterally without cough on insp or exp maneuvers   CV:  RRR  no s3 or murmur or increase in P2, and no edema   ABD:  soft and nontender with nl inspiratory excursion in the supine position. No bruits or organomegaly appreciated, bowel sounds nl  MS:  somewhat shuffling slow gait/ ext warm without deformities, calf tenderness, cyanosis or clubbing No obvious joint restrictions   SKIN: warm and dry without lesions    NEURO:  alert, approp, nl sensorium with  no motor or cerebellar deficits apparent.        I personally reviewed images and agree with radiology impression as follows:  CXR:   pa and lateral 11/02/21 No active cardiopulmonary disease.  "V/q"  11/02/21  nl perfusion      Assessment

## 2021-11-29 NOTE — Patient Instructions (Signed)
Try to be as physically active as you can and see if your activity tolerance improves    Pulmoary follow up is as needed

## 2021-11-29 NOTE — Assessment & Plan Note (Addendum)
Onset Dec 2020 - Echo 04/18/20  1. Left ventricular ejection fraction, by estimation, is 60 to 65%. The  left ventricle has normal function but is severe left ventricular hypertrophy of  the basal-septal segmen  2. Right ventricular systolic function is normal. The right ventricular  size is normal. There is mildly elevated pulmonary artery systolic  pressure. Nl LA and RA and The inferior vena cava is normal in size with greater than 50%  respiratory variability, suggesting right atrial pressure of 3 mmHg.  - pfts 12/13/20 restrictive with ERV undetectable  - 11/29/2021   Walked on RA  x  1  lap(s) =  approx 250  ft  @ slow / shuffling pace, stopped due to tire, no sob  with lowest 02 sats 98%  I don't see clinical evidence of airflow obst or enough ILD to drop sats at level of activity she demonstrated today which was > 2 x what she is doing at home so suspect this is largely a conditioning  With maybe now a  diastolic dysfunction problem but not pulmonary limited and no evidence of asthma so pulmonary f/u is not needed    >>> rec reconditioning / f/u here prn   Each maintenance medication was reviewed in detail including emphasizing most importantly the difference between maintenance and prns and under what circumstances the prns are to be triggered using an action plan format where appropriate.  Total time for H and P, chart review, counseling,  directly observing portions of ambulatory 02 saturation study/ and generating customized AVS unique to this summary final  office visit / same day charting > 40 min                  .

## 2021-12-02 ENCOUNTER — Other Ambulatory Visit: Payer: Self-pay | Admitting: Internal Medicine

## 2021-12-05 ENCOUNTER — Other Ambulatory Visit: Payer: Self-pay

## 2021-12-05 ENCOUNTER — Other Ambulatory Visit: Payer: Medicare HMO | Admitting: Hospice

## 2021-12-05 DIAGNOSIS — F03911 Unspecified dementia, unspecified severity, with agitation: Secondary | ICD-10-CM

## 2021-12-05 DIAGNOSIS — G301 Alzheimer's disease with late onset: Secondary | ICD-10-CM | POA: Diagnosis not present

## 2021-12-05 DIAGNOSIS — Z515 Encounter for palliative care: Secondary | ICD-10-CM | POA: Diagnosis not present

## 2021-12-05 DIAGNOSIS — F02818 Dementia in other diseases classified elsewhere, unspecified severity, with other behavioral disturbance: Secondary | ICD-10-CM

## 2021-12-05 DIAGNOSIS — R69 Illness, unspecified: Secondary | ICD-10-CM | POA: Diagnosis not present

## 2021-12-05 DIAGNOSIS — F419 Anxiety disorder, unspecified: Secondary | ICD-10-CM

## 2021-12-05 NOTE — Progress Notes (Signed)
Therapist, nutritional Palliative Care Consult Note Telephone: [phone]  Fax: [phone]  PATIENT NAME: Alyssa Lindsey DOB: 18-Oct-1929 MRN: [id number]  PRIMARY CARE PROVIDER:   Myrlene Broker, MD Myrlene Broker, MD [address] [place],  [place] [address]  REFERRING PROVIDER: Myrlene Broker, MD Myrlene Broker, MD [address] [place],  [place] [address]  RESPONSIBLE PARTY:   Self HPCOA: [name] and Reine Just to call [name] [phone] Contact Information     Name Relation Home Work Mobile   [place] Daughter [phone]     [place],[name] Daughter [phone]     [place], Start Daughter   [phone]       Visit is to build trust and highlight Palliative Medicine as specialized medical care for people living with serious illness, aimed at facilitating better quality of life through symptoms relief, assisting with advance care planning and complex medical decision making.  [name] and [place] are with patient during visit. This is a follow up visit.  RECOMMENDATIONS/PLAN:   Advance Care Planning/Code Status: Patient is currently a Full code. Family will discuss if and when to change it.   Goals of Care: Goals of care include to maximize quality of life and symptom management.  Visit consisted of counseling and education dealing with the complex and emotionally intense issues of symptom management and palliative care in the setting of serious and potentially life-threatening illness. Palliative care team will continue to support patient, patient's family, and medical team.  Symptom management/Plan:  Dementia: Impoverished thoughts, impaired judgement, word-finding difficulty, memory loss and confusion are ongoing in line with Dementia disease trajectory  FAST 6C. PPS 50% . Safety and Fall precautions in place.  Continue Aricept and ongoing supportive care. Encouraged reminiscence,  continue word  puzzle/search/Bingo Pain: left shoulder. Take OTC Tylenol 500mg  po TID prn pain.  HTN: Managed with Amlodipine, Losartan. Agitation: Managed with Haldol. De escalation techniques; redirection.  Anxiety: Managed with Lorazepam   Follow up: Palliative care will continue to follow for complex medical decision making, advance care planning, and clarification of goals. Return 6 weeks or prn. Encouraged to call provider sooner with any concerns.   CHIEF COMPLAINT: Palliative follow up   HISTORY OF PRESENT ILLNESS:  Alyssa Lindsey a 88 y.o. female with multiple medical problems including Dementia, hypertension, A. fib, agitation, anxiety, weight loss.  Patient denies pain/discomfort, no fever/chills, endorses colds for which she saw her PCP who sent in a prescription for her to go pick up today; Xray was negative. She endorses occasional pain mild in left shoulder, denies respiratory distress.  History obtained from review of EMR, discussion with primary team, family and/or patient. Records reviewed and summarized above. All 10 point systems reviewed and are negative except as documented in history of present illness above  Review and summarization of Epic records shows history from other than patient.   Palliative Care was asked to follow this patient o help address complex decision making in the context of advance care planning and goals of care clarification.  Physical Exam: R 18, P 60, BP 110/60, 02 98% RA Height/Weight 5 feet 5 inches/157 Ibs Constitutional: NAD General: Well groomed, cooperative EYES: anicteric sclera, lids intact, no discharge  ENMT: Moist mucous membrane CV: S1 S2, RRR, no BLE edema Pulmonary: LCTA, no increased work of breathing, no cough, Abdomen: active BS + 4 quadrants, soft and non tender GU: no suprapubic tenderness MSK: ambulatory with rolling walker, uses wheelchair for appointments  Skin: warm and dry, no rashes or wounds on visible skin Neuro:  weakness,  otherwise non focal, memory loss Psych: non-anxious affect Hem/lymph/immuno: no widespread bruising   PERTINENT MEDICATIONS:  Outpatient Encounter Medications as of 12/05/2021  Medication Sig   acetaminophen (TYLENOL) 650 MG CR tablet Take 1,300 mg by mouth every 8 (eight) hours as needed for pain.   albuterol (PROVENTIL) (2.5 MG/3ML) 0.083% nebulizer solution Take 3 mLs (2.5 mg total) by nebulization every 6 (six) hours as needed for wheezing or shortness of breath.   albuterol (VENTOLIN HFA) 108 (90 Base) MCG/ACT inhaler Inhale 2 puffs into the lungs every 8 (eight) hours.   ALPRAZolam (XANAX) 0.25 MG tablet Take 1 tablet by mouth twice daily as needed for anxiety   amLODipine (NORVASC) 5 MG tablet Take 1 tablet (5 mg total) by mouth daily.   Capsaicin 0.075 % STCK .075% topically twice per day   cetirizine (ZYRTEC) 10 MG tablet TAKE ONE TABLET BY MOUTH ONCE DAILY   donepezil (ARICEPT) 5 MG tablet TAKE 1 TABLET BY MOUTH AT BEDTIME   famotidine (PEPCID) 20 MG tablet Take 1 tablet (20 mg total) by mouth 2 (two) times daily.   furosemide (LASIX) 20 MG tablet Take 1 tablet (20 mg total) by mouth as needed for edema.   haloperidol (HALDOL) 0.5 MG tablet TAKE 1 TABLET BY MOUTH EVERY 8 HOURS AS NEEDED FOR  AGITATION   hydrochlorothiazide (MICROZIDE) 12.5 MG capsule Take 1 capsule (12.5 mg total) by mouth daily.   lidocaine (LIDODERM) 5 % Place 2 patches onto the skin daily. Remove & Discard patch within 12 hours or as directed by MD (Patient taking differently: Place 2 patches onto the skin daily. Remove & Discard patch within 12 hours or as directed by MD prn)   losartan (COZAAR) 50 MG tablet Take 1 tablet by mouth once daily   mirtazapine (REMERON) 30 MG tablet Take 30 mg by mouth at bedtime.   VITAMIN D, CHOLECALCIFEROL, PO Take 1 tablet by mouth daily. gummy   XARELTO 15 MG TABS tablet TAKE 1 TABLET BY MOUTH ONCE DAILY WITH  SUPPER   No facility-administered encounter medications on file as  of 12/05/2021.    HOSPICE ELIGIBILITY/DIAGNOSIS: TBD  PAST MEDICAL HISTORY:  Past Medical History:  Diagnosis Date   Arthritis    Atrial fibrillation (HCC)    Chronic neck pain    Diverticulosis of colon (without mention of hemorrhage)    Family history of malignant neoplasm of gastrointestinal tract    Gastritis    GERD (gastroesophageal reflux disease)    Hiatal hernia    HTN (hypertension)    LBP (low back pain)    Memory problem    Spondylosis    Thrombocytopenia (HCC)    Type II or unspecified type diabetes mellitus without mention of complication, not stated as uncontrolled       ALLERGIES:  Allergies  Allergen Reactions   Dicyclomine Hcl Other (See Comments)    Severe eye pain   Lisinopril Cough   Aspirin Other (See Comments)    Unknown reaction      I spent 45 minutes providing this consultation; this includes time spent with patient/family, chart review and documentation. More than 50% of the time in this consultation was spent on counseling and coordinating communication   Thank you for the opportunity to participate in the care of Tora PerchesDorothy B Lupi Please call our office at [phone] if we can be of additional assistance.  Note: Portions of  this note were generated with Scientist, clinical (histocompatibility and immunogenetics). Dictation errors may occur despite best attempts at proofreading.  Rosaura Carpenter, NP

## 2021-12-09 ENCOUNTER — Other Ambulatory Visit: Payer: Self-pay | Admitting: Cardiology

## 2021-12-12 ENCOUNTER — Other Ambulatory Visit: Payer: Self-pay | Admitting: *Deleted

## 2021-12-12 MED ORDER — AMLODIPINE BESYLATE 5 MG PO TABS: 5.0000 mg | ORAL_TABLET | Freq: Every day | ORAL | 0 refills | Status: DC

## 2021-12-14 ENCOUNTER — Telehealth: Payer: Self-pay | Admitting: Cardiology

## 2021-12-14 DIAGNOSIS — I480 Paroxysmal atrial fibrillation: Secondary | ICD-10-CM

## 2021-12-14 MED ORDER — RIVAROXABAN 15 MG PO TABS: 15.0000 mg | ORAL_TABLET | Freq: Every day | ORAL | 5 refills | Status: DC

## 2021-12-14 NOTE — Telephone Encounter (Signed)
°*  STAT* If patient is at the pharmacy, call can be transferred to refill team.   1. Which medications need to be refilled? (please list name of each medication and dose if known) Xarelto  2. Which pharmacy/location (including street and city if local pharmacy) is medication to be sent to?[facility]  [address], ,[place]  3. Do they need a 30 day or 90 day supply? Need enough until her appointment on 01-22-22

## 2021-12-14 NOTE — Telephone Encounter (Signed)
Prescription refill request for Xarelto received.  Indication:Afib Last office visit:5/22 Weight:71.2 kg Age:88 Scr:1.6 CrCl:26.27 ml/min  Prescription refilled

## 2021-12-20 ENCOUNTER — Ambulatory Visit (INDEPENDENT_AMBULATORY_CARE_PROVIDER_SITE_OTHER): Payer: Medicare HMO

## 2021-12-20 DIAGNOSIS — R001 Bradycardia, unspecified: Secondary | ICD-10-CM

## 2021-12-20 LAB — CUP PACEART REMOTE DEVICE CHECK
Battery Remaining Longevity: 149 mo
Battery Voltage: 3.07 V
Brady Statistic RV Percent Paced: 89.13 %
Date Time Interrogation Session: 20230124192809
Implantable Lead Implant Date: 20220121
Implantable Lead Location: 753860
Implantable Lead Model: 3830
Implantable Pulse Generator Implant Date: 20220121
Lead Channel Impedance Value: 418 Ohm
Lead Channel Impedance Value: 570 Ohm
Lead Channel Pacing Threshold Amplitude: 1 V
Lead Channel Pacing Threshold Pulse Width: 0.4 ms
Lead Channel Sensing Intrinsic Amplitude: 24.125 mV
Lead Channel Sensing Intrinsic Amplitude: 24.125 mV
Lead Channel Setting Pacing Amplitude: 2.5 V
Lead Channel Setting Pacing Pulse Width: 0.4 ms
Lead Channel Setting Sensing Sensitivity: 0.9 mV

## 2021-12-22 ENCOUNTER — Telehealth: Payer: Self-pay | Admitting: Internal Medicine

## 2021-12-22 NOTE — Telephone Encounter (Signed)
Noted  

## 2021-12-22 NOTE — Telephone Encounter (Signed)
Type of form received- [employer] Professional Verification Form   Form placed in- Provider Mailbox  Additional instructions from the patient- Call when completed.    Things to remember: Front office: If form received in person, remind patient that forms take 7-10 business days CMA should attach charge sheet and put on Supervisor's desk

## 2021-12-29 NOTE — Progress Notes (Signed)
Remote pacemaker transmission.   

## 2022-01-01 NOTE — Telephone Encounter (Signed)
Pt daughter calling to check the status of the [employer] transportation forms.   Please call [name] to update her on the status [phone]

## 2022-01-02 NOTE — Telephone Encounter (Signed)
Spoke with [name] to her know that the paperwork needs to be signed by them. It has been placed up front for pick up.

## 2022-01-08 ENCOUNTER — Other Ambulatory Visit: Payer: Self-pay | Admitting: Adult Health

## 2022-01-08 ENCOUNTER — Other Ambulatory Visit: Payer: Self-pay | Admitting: Internal Medicine

## 2022-01-16 ENCOUNTER — Telehealth (INDEPENDENT_AMBULATORY_CARE_PROVIDER_SITE_OTHER): Payer: Medicare HMO | Admitting: Internal Medicine

## 2022-01-16 ENCOUNTER — Encounter: Payer: Self-pay | Admitting: Internal Medicine

## 2022-01-16 DIAGNOSIS — R531 Weakness: Secondary | ICD-10-CM

## 2022-01-16 DIAGNOSIS — R051 Acute cough: Secondary | ICD-10-CM

## 2022-01-16 DIAGNOSIS — E1142 Type 2 diabetes mellitus with diabetic polyneuropathy: Secondary | ICD-10-CM

## 2022-01-16 DIAGNOSIS — R059 Cough, unspecified: Secondary | ICD-10-CM | POA: Insufficient documentation

## 2022-01-16 MED ORDER — DOXYCYCLINE HYCLATE 100 MG PO TABS: 100.0000 mg | ORAL_TABLET | Freq: Two times a day (BID) | ORAL | 0 refills | Status: DC

## 2022-01-16 MED ORDER — HYDROCODONE BIT-HOMATROP MBR 5-1.5 MG/5ML PO SOLN: 5.0000 mL | Freq: Four times a day (QID) | ORAL | 0 refills | Status: AC | PRN

## 2022-01-16 NOTE — Patient Instructions (Signed)
Please take all new medication as prescribed 

## 2022-01-16 NOTE — Assessment & Plan Note (Signed)
Mild overall , no recent falls, consider UC or ED or ROV for any worsening but pt declines labs or in person exam for now

## 2022-01-16 NOTE — Progress Notes (Signed)
Patient ID: Alyssa Lindsey, female   DOB: [birth year], 88 y.o.   MRN: [id number]  Virtual Visit via Video Note  I connected with Alyssa Lindsey on 01/16/22 at  8:00 AM EST by a video enabled telemedicine application and verified that I am speaking with the correct person using two identifiers.  Location of all participants today Patient: at home Provider: at office   I discussed the limitations of evaluation and management by telemedicine and the availability of in person appointments. The patient expressed understanding and agreed to proceed.  History of Present Illness: Here with acute onset mild to mod 2 wks gradually worsening ST, HA, general weakness and malaise, with prod cough greenish sputum, but Pt denies chest pain, increased sob or doe, wheezing, orthopnea, PND, increased LE swelling, palpitations, dizziness or syncope.  Pt tested covid neg yesterday.  Daughter with recent illness as well but not clear how much exposure.   Pt denies polydipsia, polyuria, or new focal neuro s/s.  Does c/o ongoing fatigue, but denies signficant daytime hypersomnolence.  Past Medical History:  Diagnosis Date   Arthritis    Atrial fibrillation (HCC)    Chronic neck pain    Diverticulosis of colon (without mention of hemorrhage)    Family history of malignant neoplasm of gastrointestinal tract    Gastritis    GERD (gastroesophageal reflux disease)    Hiatal hernia    HTN (hypertension)    LBP (low back pain)    Memory problem    Spondylosis    Thrombocytopenia (HCC)    Type II or unspecified type diabetes mellitus without mention of complication, not stated as uncontrolled    Past Surgical History:  Procedure Laterality Date   COLONOSCOPY  2010   NORMAL-- DUE 2020   PACEMAKER IMPLANT N/A 12/16/2020   Procedure: PACEMAKER IMPLANT;  Surgeon: Marinus Maw, MD;  Location: [facility];  Service: Cardiovascular;  Laterality: N/A;   TUBAL LIGATION      reports that she has never  smoked. She has never used smokeless tobacco. She reports that she does not drink alcohol and does not use drugs. family history includes Breast cancer in her sister; Colon cancer in her daughter; Diabetes in her daughter; Emphysema in her brother; Liver disease in her brother; Lung cancer in her daughter; Ovarian cancer in her sister; Stroke (age of onset: 71) in her father; Throat cancer in her brother. Allergies  Allergen Reactions   Dicyclomine Hcl Other (See Comments)    Severe eye pain   Lisinopril Cough   Aspirin Other (See Comments)    Unknown reaction   Current Outpatient Medications on File Prior to Visit  Medication Sig Dispense Refill   acetaminophen (TYLENOL) 650 MG CR tablet Take 1,300 mg by mouth every 8 (eight) hours as needed for pain.     albuterol (PROVENTIL) (2.5 MG/3ML) 0.083% nebulizer solution Take 3 mLs (2.5 mg total) by nebulization every 6 (six) hours as needed for wheezing or shortness of breath. 150 mL 1   albuterol (VENTOLIN HFA) 108 (90 Base) MCG/ACT inhaler Inhale 2 puffs into the lungs every 8 (eight) hours. 8 g 2   ALPRAZolam (XANAX) 0.25 MG tablet Take 1 tablet by mouth twice daily as needed for anxiety 60 tablet 3   amLODipine (NORVASC) 5 MG tablet Take 1 tablet (5 mg total) by mouth daily. 90 tablet 0   Capsaicin 0.075 % STCK .075% topically twice per day 1 g 1   cetirizine (ZYRTEC) 10  MG tablet TAKE ONE TABLET BY MOUTH ONCE DAILY 30 tablet 11   donepezil (ARICEPT) 5 MG tablet TAKE 1 TABLET BY MOUTH AT BEDTIME 90 tablet 0   famotidine (PEPCID) 20 MG tablet Take 1 tablet (20 mg total) by mouth 2 (two) times daily. 60 tablet 2   furosemide (LASIX) 20 MG tablet Take 1 tablet (20 mg total) by mouth as needed for edema. 30 tablet 1   haloperidol (HALDOL) 0.5 MG tablet TAKE 1 TABLET BY MOUTH EVERY 8 HOURS AS NEEDED FOR  AGITATION 90 tablet 0   hydrochlorothiazide (MICROZIDE) 12.5 MG capsule Take 1 capsule (12.5 mg total) by mouth daily. 90 capsule 0   lidocaine  (LIDODERM) 5 % Place 2 patches onto the skin daily. Remove & Discard patch within 12 hours or as directed by MD (Patient taking differently: Place 2 patches onto the skin daily. Remove & Discard patch within 12 hours or as directed by MD prn) 60 patch 0   losartan (COZAAR) 50 MG tablet Take 1 tablet by mouth once daily 90 tablet 0   mirtazapine (REMERON) 30 MG tablet Take 30 mg by mouth at bedtime.     Rivaroxaban (XARELTO) 15 MG TABS tablet Take 1 tablet (15 mg total) by mouth daily with supper. 60 tablet 5   VITAMIN D, CHOLECALCIFEROL, PO Take 1 tablet by mouth daily. gummy     No current facility-administered medications on file prior to visit.    Observations/Objective: Alert, NAD, appropriate mood and affect, resps normal, cn 2-12 intact, moves all 4s, no visible rash or swelling Lab Results  Component Value Date   WBC 9.5 09/27/2021   HGB 12.6 09/27/2021   HCT 39.0 09/27/2021   PLT 154.0 09/27/2021   GLUCOSE 123 (H) 10/23/2021   CHOL 154 09/27/2021   TRIG 113.0 09/27/2021   HDL 43.00 09/27/2021   LDLCALC 88 09/27/2021   ALT 6 09/27/2021   AST 14 09/27/2021   NA 142 10/23/2021   K 3.8 10/23/2021   CL 105 10/23/2021   CREATININE 1.64 (H) 10/23/2021   BUN 15 10/23/2021   CO2 28 10/23/2021   TSH 1.350 10/29/2019   INR 0.98 03/04/2014   HGBA1C 6.3 09/27/2021   MICROALBUR 1.1 09/11/2019   Lab Results  Component Value Date   VITAMINB12 250 08/12/2018    Assessment and Plan: See notes  Follow Up Instructions: See notes   I discussed the assessment and treatment plan with the patient. The patient was provided an opportunity to ask questions and all were answered. The patient agreed with the plan and demonstrated an understanding of the instructions.   The patient was advised to call back or seek an in-person evaluation if the symptoms worsen or if the condition fails to improve as anticipated.  Oliver Barre, MD

## 2022-01-16 NOTE — Assessment & Plan Note (Signed)
Lab Results  Component Value Date   HGBA1C 6.3 09/27/2021   Stable, pt to continue current medical treatment  - diet

## 2022-01-16 NOTE — Assessment & Plan Note (Signed)
Mild to mod, cant r/o bronchitis vs pna, declines cxr, for antibx course, cough med prn,   to f/u any worsening symptoms or concerns

## 2022-01-18 ENCOUNTER — Telehealth: Payer: Self-pay | Admitting: Internal Medicine

## 2022-01-18 ENCOUNTER — Other Ambulatory Visit: Payer: Self-pay

## 2022-01-18 ENCOUNTER — Other Ambulatory Visit: Payer: Medicare HMO | Admitting: Hospice

## 2022-01-18 DIAGNOSIS — R051 Acute cough: Secondary | ICD-10-CM

## 2022-01-18 DIAGNOSIS — F03911 Unspecified dementia, unspecified severity, with agitation: Secondary | ICD-10-CM

## 2022-01-18 DIAGNOSIS — T50905S Adverse effect of unspecified drugs, medicaments and biological substances, sequela: Secondary | ICD-10-CM

## 2022-01-18 DIAGNOSIS — R69 Illness, unspecified: Secondary | ICD-10-CM | POA: Diagnosis not present

## 2022-01-18 DIAGNOSIS — G301 Alzheimer's disease with late onset: Secondary | ICD-10-CM

## 2022-01-18 DIAGNOSIS — Z515 Encounter for palliative care: Secondary | ICD-10-CM | POA: Diagnosis not present

## 2022-01-18 NOTE — Telephone Encounter (Signed)
Okay to stop that medication.

## 2022-01-18 NOTE — Telephone Encounter (Signed)
Medication was HYDROcodone bit-homatropine (HYCODAN) 5-1.5 MG/5ML syrup  Patient was seen virtually 02.21.23 by Dr. Jonny Ruiz

## 2022-01-18 NOTE — Telephone Encounter (Signed)
Patient daughter [name] calling in  Patient was prescribed for cough..daughter says since starting med noticed patient shaking in extremities more than usual  Callback [phone]

## 2022-01-18 NOTE — Progress Notes (Signed)
[facility] Palliative Care Consult Note Telephone: [phone]  Fax: [phone]  PATIENT NAME: Alyssa Lindsey DOB: [birth year] MRN: [id number]  PRIMARY CARE PROVIDER:   Hoyt Koch, MD Alyssa Koch, MD [address] [place],  [place] [address]  REFERRING PROVIDER: Hoyt Koch, MD Alyssa Koch, MD [place],  [place] [address]  RESPONSIBLE PARTY:   Self HPCOA: [name] and [name] to call [name] [phone]     Name Relation Home Work Mobile   Spiro Daughter [phone]     Blansett,[name] Daughter [phone]     Allisa, Jury Daughter   [phone]      TELEHEALTH VISIT STATEMENT Due to the COVID-19 crisis, this visit was done via telemedicine from my office and it was initiated and consent by this patient and or family.  I connected with patient OR PROXY by a telephone/video  and verified that I am speaking with the correct person. I discussed the limitations of evaluation and management by telemedicine. The patient expressed understanding and agreed to proceed. Palliative Care was asked to follow this patient to address advance care planning, complex medical decision making and goals of care clarification.   Visit is to build trust and highlight Palliative Medicine as specialized medical care for people living with serious illness, aimed at facilitating better quality of life through symptoms relief, assisting with advance care planning and complex medical decision making.  [name] is with patient during visit. This is a follow up visit.  RECOMMENDATIONS/PLAN:   Advance Care Planning/Code Status: Patient is currently a Full code. Family will discuss if and when to change it.   Goals of Care: Goals of care include to maximize quality of life and symptom management.  Visit consisted of counseling and education dealing with the complex and emotionally  intense issues of symptom management and palliative care in the setting of serious and potentially life-threatening illness. Palliative care team will continue to support patient, patient's family, and medical team.  Symptom management/Plan:  Cough: Acute. PCP started on pt doxycycline, HYDROcodone bit-homatropine (HYCODAN) 5-1.5 MG/5ML syrup.  Report of medication reaction :lethargy and increasing shaking of hands since taking hycodan.   Recommendation: Stop Hycodan. Take Mucinex 600 mg twice daily x5 days, take doxycycline as ordered 100 mg twice daily x10 days to completion.  Take Florastor 250 mg twice daily x10 days. Also discussed neurological consult if needed, if shaking of hands continues.  Shaking of hands is chronic, worsened with Hycodan per daughter [name].    Dementia: Impoverished thoughts, impaired judgement, word-finding difficulty, memory loss and confusion are ongoing in line with Dementia disease trajectory  FAST 6C. PPS 50% . Safety and Fall precautions in place.  Continue Aricept and ongoing supportive care. Encouraged reminiscence,  continue word puzzle/search/Bingo Pain: left shoulder. Take OTC Tylenol 500mg  po TID prn pain.  HTN: Managed with Amlodipine, Losartan. Agitation: Managed with Haldol. De escalation techniques; redirection.  Anxiety: Managed with Lorazepam   Follow up: Palliative care will continue to follow for complex medical decision making, advance care planning, and clarification of goals. Return 6 weeks or prn. Encouraged to call provider sooner with any concerns.   CHIEF COMPLAINT: Palliative follow up   HISTORY OF PRESENT ILLNESS:  Alyssa Lindsey a 88 y.o. female with multiple medical problems including multiple comorbidities requiring close monitoring: dementia, hypertension, A. fib, agitation, anxiety, weight loss.  Report of medication reaction of lethargy and increased increasing shaking  of hands since patient was started on HYDROcodone  bit-homatropine (HYCODAN) 5-1.5 MG/5ML syrup for cough.  Patient reports coughing out clear to yellow phlegm, sleep not in bed by coughing, endorsed increasing shaking of hands, sleepiness.  History obtained from review of EMR, discussion with primary team, family and/or patient. Records reviewed and summarized above. All 10 point systems reviewed and are negative except as documented in history of present illness above  Review and summarization of Epic records shows history from other than patient.   Palliative Care was asked to follow this patient o help address complex decision making in the context of advance care planning and goals of care clarification.    PERTINENT MEDICATIONS:  Outpatient Encounter Medications as of 01/18/2022  Medication Sig   acetaminophen (TYLENOL) 650 MG CR tablet Take 1,300 mg by mouth every 8 (eight) hours as needed for pain.   albuterol (PROVENTIL) (2.5 MG/3ML) 0.083% nebulizer solution Take 3 mLs (2.5 mg total) by nebulization every 6 (six) hours as needed for wheezing or shortness of breath.   albuterol (VENTOLIN HFA) 108 (90 Base) MCG/ACT inhaler Inhale 2 puffs into the lungs every 8 (eight) hours.   ALPRAZolam (XANAX) 0.25 MG tablet Take 1 tablet by mouth twice daily as needed for anxiety   amLODipine (NORVASC) 5 MG tablet Take 1 tablet (5 mg total) by mouth daily.   Capsaicin 0.075 % STCK .075% topically twice per day   cetirizine (ZYRTEC) 10 MG tablet TAKE ONE TABLET BY MOUTH ONCE DAILY   donepezil (ARICEPT) 5 MG tablet TAKE 1 TABLET BY MOUTH AT BEDTIME   doxycycline (VIBRA-TABS) 100 MG tablet Take 1 tablet (100 mg total) by mouth 2 (two) times daily.   famotidine (PEPCID) 20 MG tablet Take 1 tablet (20 mg total) by mouth 2 (two) times daily.   furosemide (LASIX) 20 MG tablet Take 1 tablet (20 mg total) by mouth as needed for edema.   haloperidol (HALDOL) 0.5 MG tablet TAKE 1 TABLET BY MOUTH EVERY 8 HOURS AS NEEDED FOR  AGITATION   hydrochlorothiazide  (MICROZIDE) 12.5 MG capsule Take 1 capsule (12.5 mg total) by mouth daily.   HYDROcodone bit-homatropine (HYCODAN) 5-1.5 MG/5ML syrup Take 5 mLs by mouth every 6 (six) hours as needed for up to 10 days.   lidocaine (LIDODERM) 5 % Place 2 patches onto the skin daily. Remove & Discard patch within 12 hours or as directed by MD (Patient taking differently: Place 2 patches onto the skin daily. Remove & Discard patch within 12 hours or as directed by MD prn)   losartan (COZAAR) 50 MG tablet Take 1 tablet by mouth once daily   mirtazapine (REMERON) 30 MG tablet Take 30 mg by mouth at bedtime.   Rivaroxaban (XARELTO) 15 MG TABS tablet Take 1 tablet (15 mg total) by mouth daily with supper.   VITAMIN D, CHOLECALCIFEROL, PO Take 1 tablet by mouth daily. gummy   No facility-administered encounter medications on file as of 01/18/2022.    HOSPICE ELIGIBILITY/DIAGNOSIS: TBD  PAST MEDICAL HISTORY:  Past Medical History:  Diagnosis Date   Arthritis    Atrial fibrillation (HCC)    Chronic neck pain    Diverticulosis of colon (without mention of hemorrhage)    Family history of malignant neoplasm of gastrointestinal tract    Gastritis    GERD (gastroesophageal reflux disease)    Hiatal hernia    HTN (hypertension)    LBP (low back pain)    Memory problem    Spondylosis  Thrombocytopenia ([place])    Type II or unspecified type diabetes mellitus without mention of complication, not stated as uncontrolled       ALLERGIES:  Allergies  Allergen Reactions   Dicyclomine Hcl Other (See Comments)    Severe eye pain   Lisinopril Cough   Aspirin Other (See Comments)    Unknown reaction      Thank you for the opportunity to participate in the care of Alyssa Lindsey Please call our office at [phone] if we can be of additional assistance.  Note: Portions of this note were generated with L[employer]. Dictation errors may occur despite best attempts at proofreading.  Teodoro Spray,  NP

## 2022-01-18 NOTE — Telephone Encounter (Signed)
Spoke with the pts daughter and was able to inform her about Dr. Nathanial Millman advice.

## 2022-01-19 ENCOUNTER — Telehealth: Payer: Self-pay

## 2022-01-19 NOTE — Telephone Encounter (Signed)
Pt calling in to inform provider that she tested POS for COVID 01/18/22. ° °FYI °

## 2022-01-22 ENCOUNTER — Ambulatory Visit: Payer: Medicare HMO | Admitting: Cardiology

## 2022-01-26 ENCOUNTER — Other Ambulatory Visit: Payer: Self-pay | Admitting: Internal Medicine

## 2022-01-31 NOTE — Progress Notes (Signed)
?  ?  ?Cardiology Office Note ? ? ?Date:  02/01/2022  ? ?ID:  Alyssa Lindsey, DOB 03-28-1929, MRN [id number] ? ?PCP:  Myrlene Broker, MD  ?Cardiologist:   Rollene Rotunda, MD ? ? ?Chief Complaint  ?Patient presents with  ? Shortness of Breath  ? ? ?  ?History of Present Illness: ?Alyssa Lindsey is a 88 y.o. female who presents for follow up of atrial fib.   She was in the ED in April 2020 with slow ventricular rate/junctional rhythm.   She has a pacemaker.   ? ?She is followed by palliative care.  She continues to complain of dyspnea as her primary complaint.  She was having this evaluated by pulmonary when she had her diagnosis of complete heart block that had not resolved with med changes and was sent to the hospital for pacemaker.  At that time she had restrictive disease on pulmonary function testing and had been sent by her primary provider to pulmonary but they did not get to really complete this work-up.  She has not been back.  She continues to complain of shortness of breath getting around her house.  However, she is very deconditioned.  She has some resting tremor.  She is not however describing any PND or orthopnea.  Cough that she was having with some hemoptysis has not been witnessed by the family.  The patient has dementia and so her history is not entirely thorough.  She complains mostly of some pain under the left shoulder blade which her family says has been there since she had shingles.  She is not describing substernal chest discomfort.  She does not report any palpitations, presyncope or syncope. ? ? ?Past Medical History:  ?Diagnosis Date  ? Arthritis   ? Atrial fibrillation (HCC)   ? Chronic neck pain   ? Diverticulosis of colon (without mention of hemorrhage)   ? Family history of malignant neoplasm of gastrointestinal tract   ? Gastritis   ? GERD (gastroesophageal reflux disease)   ? Hiatal hernia   ? HTN (hypertension)   ? LBP (low back pain)   ? Memory problem   ? Spondylosis   ?  Thrombocytopenia (HCC)   ? Type II or unspecified type diabetes mellitus without mention of complication, not stated as uncontrolled   ? ? ?Past Surgical History:  ?Procedure Laterality Date  ? COLONOSCOPY  2010  ? NORMAL-- DUE 2020  ? PACEMAKER IMPLANT N/A 12/16/2020  ? Procedure: PACEMAKER IMPLANT;  Surgeon: Marinus Maw, MD;  Location: [facility] INVASIVE CV LAB;  Service: Cardiovascular;  Laterality: N/A;  ? TUBAL LIGATION    ? ? ? ?Current Outpatient Medications  ?Medication Sig Dispense Refill  ? acetaminophen (TYLENOL) 650 MG CR tablet Take 1,300 mg by mouth every 8 (eight) hours as needed for pain.    ? albuterol (PROVENTIL) (2.5 MG/3ML) 0.083% nebulizer solution Take 3 mLs (2.5 mg total) by nebulization every 6 (six) hours as needed for wheezing or shortness of breath. 150 mL 1  ? albuterol (VENTOLIN HFA) 108 (90 Base) MCG/ACT inhaler Inhale 2 puffs into the lungs every 8 (eight) hours. 8 g 2  ? ALPRAZolam (XANAX) 0.25 MG tablet Take 1 tablet by mouth twice daily as needed for anxiety 60 tablet 3  ? amLODipine (NORVASC) 5 MG tablet Take 1 tablet (5 mg total) by mouth daily. 90 tablet 0  ? cetirizine (ZYRTEC) 10 MG tablet TAKE ONE TABLET BY MOUTH ONCE DAILY 30 tablet 11  ?  donepezil (ARICEPT) 5 MG tablet TAKE 1 TABLET BY MOUTH AT BEDTIME 90 tablet 0  ? famotidine (PEPCID) 20 MG tablet Take 1 tablet (20 mg total) by mouth 2 (two) times daily. 60 tablet 2  ? furosemide (LASIX) 20 MG tablet Take 1 tablet (20 mg total) by mouth as needed for edema. 30 tablet 1  ? haloperidol (HALDOL) 0.5 MG tablet TAKE 1 TABLET BY MOUTH EVERY 8 HOURS AS NEEDED FOR  AGITATION 90 tablet 0  ? hydrochlorothiazide (MICROZIDE) 12.5 MG capsule Take 1 capsule (12.5 mg total) by mouth daily. 90 capsule 0  ? lidocaine (LIDODERM) 5 % Place 2 patches onto the skin daily. Remove & Discard patch within 12 hours or as directed by MD (Patient taking differently: Place 2 patches onto the skin daily. Remove & Discard patch within 12 hours or as  directed by MD prn) 60 patch 0  ? losartan (COZAAR) 50 MG tablet Take 1 tablet by mouth once daily 90 tablet 0  ? mirtazapine (REMERON) 30 MG tablet Take 30 mg by mouth at bedtime.    ? Rivaroxaban (XARELTO) 15 MG TABS tablet Take 1 tablet (15 mg total) by mouth daily with supper. 60 tablet 5  ? VITAMIN D, CHOLECALCIFEROL, PO Take 1 tablet by mouth daily. gummy    ? Capsaicin 0.075 % STCK .075% topically twice per day 1 g 1  ? doxycycline (VIBRA-TABS) 100 MG tablet Take 1 tablet (100 mg total) by mouth 2 (two) times daily. 20 tablet 0  ? ?No current facility-administered medications for this visit.  ? ? ?Allergies:   Dicyclomine hcl, Lisinopril, and Aspirin  ? ? ?ROS:  Please see the history of present illness.   Otherwise, review of systems are positive for none.   All other systems are reviewed and negative.  ? ? ?PHYSICAL EXAM: ?VS:  BP 123/71   Pulse 63   Ht 5\' 5"  (1.651 m)   Wt 160 lb (72.6 kg)   SpO2 98%   BMI 26.63 kg/m?  , BMI Body mass index is 26.63 kg/m?. ?GEN:  No distress, very frail appearing ?NECK:  No jugular venous distention at 90 degrees, waveform within normal limits, carotid upstroke brisk and symmetric, no bruits, no thyromegaly ?LYMPHATICS:  No cervical adenopathy ?LUNGS: Decreased breath sounds with some coarse crackles one third of the way up on the lung bases ?BACK:  No CVA tenderness ?CHEST:  Unremarkable ?HEART:  S1 and S2 within normal limits, no S3, no S4, no clicks, no rubs, no murmurs ?ABD:  Positive bowel sounds normal in frequency in pitch, no bruits, no rebound, no guarding, unable to assess midline mass or bruit with the patient seated. ?EXT:  2 plus pulses throughout, trace bilateral ankle edema, no cyanosis no clubbing ? ? ?EKG:  EKG is not ordered today. ? ? ?Recent Labs: ?09/27/2021: ALT 6; Hemoglobin 12.6; Platelets 154.0 ?10/23/2021: BUN 15; Creatinine, Ser 1.64; Potassium 3.8; Pro B Natriuretic peptide (BNP) 266.0; Sodium 142  ? ? ?Lipid Panel ?   ?Component Value  Date/Time  ? CHOL 154 09/27/2021 1012  ? TRIG 113.0 09/27/2021 1012  ? HDL 43.00 09/27/2021 1012  ? CHOLHDL 4 09/27/2021 1012  ? VLDL 22.6 09/27/2021 1012  ? LDLCALC 88 09/27/2021 1012  ? ?  ? ?Wt Readings from Last 3 Encounters:  ?02/01/22 160 lb (72.6 kg)  ?11/29/21 157 lb (71.2 kg)  ?10/23/21 164 lb 6.4 oz (74.6 kg)  ?  ? ? ?Other studies Reviewed: ?Additional studies/ records  that were reviewed today include:   EP records, previous pulmonary records and primary care records ?Review of the above records demonstrates:  Please see elsewhere in the note.   ? ? ?ASSESSMENT AND PLAN: ? ?ATRIAL FIB:  Ms. Tora PerchesDorothy B Mulgrew has a CHA2DS2 - VASc score of 5.   She tolerates anticoagulation.  No change in therapy. ? ?HTN:  Her BP is at target.  She will continue the meds as listed.  ? ?SOB: I do not think she is volume overloaded.  She has had normal left ventricular function although her BNP has been mildly elevated last fall.  We talked about salt and fluid restriction.  She did have normal pulmonary function test and I think she needs to have follow-up of this at least 1 more time to complete any potential work-up or med changes.  Did not get to happen last year.  However, I suspect her shortness of breath is multifactorial with age and deconditioning.  I will refer her back to pulmonary.   ? ?CKD IIIB:   Creatinine was 1.64 which is unchanged or slightly better than before.  This was in November.   ? ?PACEMAKER: She is up-to-date with follow-up.  I reviewed these records. ? ?Current medicines are reviewed at length with the patient today.  The patient does not have concerns regarding medicines. ? ?The following changes have been made:  None ? ?Labs/ tests ordered today include:  ? ?No orders of the defined types were placed in this encounter. ? ? ? ?Disposition:   FU with me in one year.  ? ?Signed, ?Rollene RotundaJames Shirly Bartosiewicz, MD  ?02/01/2022 10:45 AM    ?[facility] ?

## 2022-02-01 ENCOUNTER — Ambulatory Visit (INDEPENDENT_AMBULATORY_CARE_PROVIDER_SITE_OTHER): Payer: Medicare HMO | Admitting: Cardiology

## 2022-02-01 ENCOUNTER — Encounter: Payer: Self-pay | Admitting: Cardiology

## 2022-02-01 ENCOUNTER — Other Ambulatory Visit: Payer: Self-pay

## 2022-02-01 VITALS — BP 123/71 | HR 63 | Ht 65.0 in | Wt 160.0 lb

## 2022-02-01 DIAGNOSIS — I482 Chronic atrial fibrillation, unspecified: Secondary | ICD-10-CM | POA: Diagnosis not present

## 2022-02-01 DIAGNOSIS — R0602 Shortness of breath: Secondary | ICD-10-CM

## 2022-02-01 DIAGNOSIS — I10 Essential (primary) hypertension: Secondary | ICD-10-CM

## 2022-02-01 DIAGNOSIS — N1832 Chronic kidney disease, stage 3b: Secondary | ICD-10-CM

## 2022-02-01 NOTE — Patient Instructions (Signed)
Medication Instructions:  ?Your physician recommends that you continue on your current medications as directed. Please refer to the Current Medication list given to you today.  ? ?*If you need a refill on your cardiac medications before your next appointment, please call your pharmacy* ? ? ?Lab Work: ?NONE ?If you have labs (blood work) drawn today and your tests are completely normal, you will receive your results only by: ?MyChart Message (if you have MyChart) OR ?A paper copy in the mail ?If you have any lab test that is abnormal or we need to change your treatment, we will call you to review the results. ? ? ?Testing/Procedures: ?NONE ? ? ?Follow-Up: ?At [facility], you and your health needs are our priority.  As part of our continuing mission to provide you with exceptional heart care, we have created designated Provider Care Teams.  These Care Teams include your primary Cardiologist (physician) and Advanced Practice Providers (APPs -  Physician Assistants and Nurse Practitioners) who all work together to provide you with the care you need, when you need it. ? ?We recommend signing up for the patient portal called "MyChart".  Sign up information is provided on this After Visit Summary.  MyChart is used to connect with patients for Virtual Visits (Telemedicine).  Patients are able to view lab/test results, encounter notes, upcoming appointments, etc.  Non-urgent messages can be sent to your provider as well.   ?To learn more about what you can do with MyChart, go to [email or web address].   ? ?Your next appointment:   ?12 month(s) ? ?The format for your next appointment:   ?In Person ? ?Provider:   ?Rollene Rotunda, MD { ? ? ?02/15/2022 11:00 AM WITH [facility] NP AT DR Thurston Hole OFFICE  ? ?

## 2022-02-04 ENCOUNTER — Encounter: Payer: Self-pay | Admitting: Internal Medicine

## 2022-02-06 ENCOUNTER — Telehealth: Payer: Self-pay

## 2022-02-06 ENCOUNTER — Other Ambulatory Visit: Payer: Self-pay | Admitting: Internal Medicine

## 2022-02-06 ENCOUNTER — Other Ambulatory Visit: Payer: Medicare HMO | Admitting: Hospice

## 2022-02-06 ENCOUNTER — Other Ambulatory Visit: Payer: Self-pay | Admitting: Cardiology

## 2022-02-06 ENCOUNTER — Other Ambulatory Visit: Payer: Self-pay

## 2022-02-06 DIAGNOSIS — R69 Illness, unspecified: Secondary | ICD-10-CM | POA: Diagnosis not present

## 2022-02-06 DIAGNOSIS — F4323 Adjustment disorder with mixed anxiety and depressed mood: Secondary | ICD-10-CM

## 2022-02-06 DIAGNOSIS — F419 Anxiety disorder, unspecified: Secondary | ICD-10-CM

## 2022-02-06 DIAGNOSIS — F03911 Unspecified dementia, unspecified severity, with agitation: Secondary | ICD-10-CM

## 2022-02-06 DIAGNOSIS — Z515 Encounter for palliative care: Secondary | ICD-10-CM | POA: Diagnosis not present

## 2022-02-06 DIAGNOSIS — R600 Localized edema: Secondary | ICD-10-CM | POA: Diagnosis not present

## 2022-02-06 DIAGNOSIS — G301 Alzheimer's disease with late onset: Secondary | ICD-10-CM | POA: Diagnosis not present

## 2022-02-06 DIAGNOSIS — F02818 Dementia in other diseases classified elsewhere, unspecified severity, with other behavioral disturbance: Secondary | ICD-10-CM

## 2022-02-06 NOTE — Progress Notes (Signed)
? ?  ? ? ?Civil engineer, contracting ?Community Palliative Care Consult Note ?Telephone: [phone]  ?Fax: [phone] ? ?PATIENT NAME: Alyssa Lindsey ?DOB: 16-May-1929 ?MRN: [id number] ? ?PRIMARY CARE PROVIDER:   Myrlene Broker, MD ?Myrlene Broker, MD ?[address] ?[place],  [place] [address] ? ?REFERRING PROVIDER: Myrlene Broker, MD ?Myrlene Broker, MD ?[address] ?[place],  [place] [address] ? ?RESPONSIBLE PARTY:   Self ?HPCOA: [name] and [name] ?Best to call [name] [phone] ?Contact Information   ? ? Name Relation Home Work Mobile  ? Huntley,[name] Daughter [phone]    ? Plantz,[name] Daughter [phone]    ? Lorece, Keach Daughter   [phone]  ? ?  ? ? ?Visit is to build trust and highlight Palliative Medicine as specialized medical care for people living with serious illness, aimed at facilitating better quality of life through symptoms relief, assisting with advance care planning and complex medical decision making.  [name] and [place] are with patient during visit. This is a follow up visit. ? ?RECOMMENDATIONS/PLAN:  ? ?Advance Care Planning/Code Status: Patient is currently a Full code. Family will discuss if and when to change it.  ? ?Goals of Care: Goals of care include to maximize quality of life and symptom management. ? Palliative care team will continue to support patient, patient's family, and medical team. ? ?Symptom management/Plan:  ?Edema to BLE: Related to chronic congestive heart failure.  Patient is currently on HTCZ and Lasix.  Increase Lasix from 20 mg to 40 mg daily x3 days. Elevate BLE during the day as much as possible to promote circulation.  Adhere to fluid and salt limits.  Monitor weight closely and report gait weight gain of 2 pounds in a day or 5 pounds in a week. ?Dementia: Impoverished thoughts, impaired judgement, word-finding difficulty, memory loss and confusion are ongoing in line with Dementia disease trajectory  FAST 6C. PPS  50% . Safety and Fall precautions in place.  Continue Aricept and ongoing supportive care. Encourage reminiscence,  continue word puzzle/search/Bingo ?Pain: left shoulder.  Continue lidocaine patch as ordered, OTC Tylenol 500mg  po TID prn pain.  ?HTN: Managed with Amlodipine, Losartan. ?Agitation: Managed with Haldol. De escalation techniques; redirection.  ?Anxiety: Managed with Lorazepam ?Follow up: Palliative care will continue to follow for complex medical decision making, advance care planning, and clarification of goals. Return 6 weeks or prn. Encouraged to call provider sooner with any concerns. ?  ?CHIEF COMPLAINT: Palliative follow up ?  ?HISTORY OF PRESENT ILLNESS: Alyssa Lindsey a 88 y.o. female with multiple medical problems including multiple comorbidities requiring close monitoring: dementia, hypertension, A. fib, agitation, anxiety, weight loss. Patient denies pain/discomfort, no fever/chills, endorses occasional mild pain in left shoulder, denies respiratory distress.  She reports she has been drinking more lemonade recently than before.  History obtained from review of EMR, discussion with primary team, family and/or patient. Records reviewed and summarized above. All 10 point systems reviewed and are negative except as documented in history of present illness above ? ?Review and summarization of Epic records shows history from other than patient.  ? ?Palliative Care was asked to follow this patient o help address complex decision making in the context of advance care planning and goals of care clarification. ? ?Physical Exam: ?Height/Weight 5 feet 5 inches/160 Ibs ?Constitutional: NAD ?General: Well groomed, cooperative ?EYES: anicteric sclera, lids intact, no discharge  ?ENMT: Moist mucous membrane ?CV: S1 S2, RRR, 1+ pitting edema to bilateral ankles ?Pulmonary:  no increased work of breathing, no cough, 02 saturation 98% room air, pulse 61, respiration 18 ?Abdomen: active BS + 4 quadrants, soft  and non tender ?GU: no suprapubic tenderness ?MSK: Weakness, limited ambulation within home with rolling walker, uses wheelchair for appointments ?Skin: warm and dry, no rashes or wounds on visible skin ?Neuro:  weakness, otherwise non focal, memory loss ?Psych: non-anxious affect ?Hem/lymph/immuno: no widespread bruising ? ? ?PERTINENT MEDICATIONS:  ?Outpatient Encounter Medications as of 02/06/2022  ?Medication Sig  ? acetaminophen (TYLENOL) 650 MG CR tablet Take 1,300 mg by mouth every 8 (eight) hours as needed for pain.  ? albuterol (PROVENTIL) (2.5 MG/3ML) 0.083% nebulizer solution Take 3 mLs (2.5 mg total) by nebulization every 6 (six) hours as needed for wheezing or shortness of breath.  ? albuterol (VENTOLIN HFA) 108 (90 Base) MCG/ACT inhaler Inhale 2 puffs into the lungs every 8 (eight) hours.  ? ALPRAZolam (XANAX) 0.25 MG tablet Take 1 tablet by mouth twice daily as needed for anxiety  ? amLODipine (NORVASC) 5 MG tablet Take 1 tablet (5 mg total) by mouth daily.  ? Capsaicin 0.075 % STCK .075% topically twice per day  ? cetirizine (ZYRTEC) 10 MG tablet TAKE ONE TABLET BY MOUTH ONCE DAILY  ? donepezil (ARICEPT) 5 MG tablet TAKE 1 TABLET BY MOUTH AT BEDTIME  ? doxycycline (VIBRA-TABS) 100 MG tablet Take 1 tablet (100 mg total) by mouth 2 (two) times daily.  ? famotidine (PEPCID) 20 MG tablet Take 1 tablet (20 mg total) by mouth 2 (two) times daily.  ? furosemide (LASIX) 20 MG tablet Take 1 tablet (20 mg total) by mouth as needed for edema.  ? haloperidol (HALDOL) 0.5 MG tablet TAKE 1 TABLET BY MOUTH EVERY 8 HOURS AS NEEDED FOR  AGITATION  ? hydrochlorothiazide (MICROZIDE) 12.5 MG capsule Take 1 capsule (12.5 mg total) by mouth daily.  ? lidocaine (LIDODERM) 5 % Place 2 patches onto the skin daily. Remove & Discard patch within 12 hours or as directed by MD (Patient taking differently: Place 2 patches onto the skin daily. Remove & Discard patch within 12 hours or as directed by MD prn)  ? losartan (COZAAR) 50  MG tablet Take 1 tablet by mouth once daily  ? mirtazapine (REMERON) 30 MG tablet Take 30 mg by mouth at bedtime.  ? Rivaroxaban (XARELTO) 15 MG TABS tablet Take 1 tablet (15 mg total) by mouth daily with supper.  ? VITAMIN D, CHOLECALCIFEROL, PO Take 1 tablet by mouth daily. gummy  ? ?No facility-administered encounter medications on file as of 02/06/2022.  ? ? ?HOSPICE ELIGIBILITY/DIAGNOSIS: TBD ? ?PAST MEDICAL HISTORY:  ?Past Medical History:  ?Diagnosis Date  ? Arthritis   ? Atrial fibrillation (HCC)   ? Chronic neck pain   ? Diverticulosis of colon (without mention of hemorrhage)   ? Family history of malignant neoplasm of gastrointestinal tract   ? Gastritis   ? GERD (gastroesophageal reflux disease)   ? Hiatal hernia   ? HTN (hypertension)   ? LBP (low back pain)   ? Memory problem   ? Spondylosis   ? Thrombocytopenia (HCC)   ? Type II or unspecified type diabetes mellitus without mention of complication, not stated as uncontrolled   ?  ? ? ?ALLERGIES:  ?Allergies  ?Allergen Reactions  ? Dicyclomine Hcl Other (See Comments)  ?  Severe eye pain  ? Lisinopril Cough  ? Aspirin Other (See Comments)  ?  Unknown reaction  ?   ? ?Thank you  for the opportunity to participate in the care of DALENA PLANTZ Please call our office at [phone] if we can be of additional assistance. ? ?Note: Portions of this note were generated with Scientist, clinical (histocompatibility and immunogenetics). Dictation errors may occur despite best attempts at proofreading. ? ?Rosaura Carpenter, NP ? ?  ?

## 2022-02-06 NOTE — Telephone Encounter (Signed)
Pt is needing a refill on: ?mirtazapine (REMERON) 30 MG tablet ?Capsaicin 0.075 % STCK ? ?Pharmacy: ?[id number] - [place], [place] - [address] ?

## 2022-02-07 MED ORDER — MIRTAZAPINE 30 MG PO TABS: 30.0000 mg | ORAL_TABLET | Freq: Every day | ORAL | 1 refills | Status: DC

## 2022-02-07 MED ORDER — CAPSAICIN 0.075 % EX STCK: CUTANEOUS | 1 refills | Status: DC

## 2022-02-07 NOTE — Telephone Encounter (Signed)
Request for refill of: ?mirtazapine (REMERON) 30 MG tablet ?Capsaicin 0.075 % STCK ? ?I do not see you as the prescriber.. Pt sent a message on 02/04/22 regarding the Capsaicin 0.075% STCK. Was originally prescribed by pain management physician but pt has not been able to return for a f/u due to her current conditions and daughter states pt is still expressing soreness due to her shingles outbreak.  ?

## 2022-02-07 NOTE — Telephone Encounter (Signed)
Okay to refill both.

## 2022-02-15 ENCOUNTER — Ambulatory Visit: Payer: Medicare HMO | Admitting: Primary Care

## 2022-02-15 ENCOUNTER — Encounter: Payer: Self-pay | Admitting: Primary Care

## 2022-02-15 ENCOUNTER — Ambulatory Visit (INDEPENDENT_AMBULATORY_CARE_PROVIDER_SITE_OTHER): Payer: Medicare HMO

## 2022-02-15 ENCOUNTER — Other Ambulatory Visit: Payer: Self-pay

## 2022-02-15 VITALS — BP 118/74 | HR 62 | Temp 98.1°F | Ht 65.0 in | Wt 154.0 lb

## 2022-02-15 DIAGNOSIS — I517 Cardiomegaly: Secondary | ICD-10-CM | POA: Diagnosis not present

## 2022-02-15 DIAGNOSIS — R0609 Other forms of dyspnea: Secondary | ICD-10-CM

## 2022-02-15 DIAGNOSIS — R0602 Shortness of breath: Secondary | ICD-10-CM | POA: Diagnosis not present

## 2022-02-15 LAB — CBC
HCT: 38.4 % (ref 36.0–46.0)
Hemoglobin: 12.2 g/dL (ref 12.0–15.0)
MCHC: 31.7 g/dL (ref 30.0–36.0)
MCV: 87.1 fl (ref 78.0–100.0)
Platelets: 152 10*3/uL (ref 150.0–400.0)
RBC: 4.41 Mil/uL (ref 3.87–5.11)
RDW: 16.8 % — ABNORMAL HIGH (ref 11.5–15.5)
WBC: 5.6 10*3/uL (ref 4.0–10.5)

## 2022-02-15 LAB — BASIC METABOLIC PANEL
BUN: 13 mg/dL (ref 6–23)
CO2: 30 mEq/L (ref 19–32)
Calcium: 9 mg/dL (ref 8.4–10.5)
Chloride: 105 mEq/L (ref 96–112)
Creatinine, Ser: 1.77 mg/dL — ABNORMAL HIGH (ref 0.40–1.20)
GFR: 24.93 mL/min — ABNORMAL LOW (ref >60.00)
Glucose, Bld: 141 mg/dL — ABNORMAL HIGH (ref 70–99)
Potassium: 2.9 mEq/L — ABNORMAL LOW (ref 3.5–5.1)
Sodium: 145 mEq/L (ref 135–145)

## 2022-02-15 LAB — BRAIN NATRIURETIC PEPTIDE: Pro B Natriuretic peptide (BNP): 212 pg/mL — ABNORMAL HIGH (ref 0.0–100.0)

## 2022-02-15 NOTE — Progress Notes (Signed)
? ?@Patient  ID: Alyssa Lindsey, female    DOB: Mar 26, 1929, 88 y.o.   MRN: [id number] ? ?Chief Complaint  ?Patient presents with  ? Follow-up  ?  She is having some short of breath with exertion x 2 weeks and dry cough.   ? ? ?Referring provider: ?Pricilla Holm A, * ? ? ?Brief patient profile:  ?67 yowf never smoker with new onset doe Dec 2020  Assoc with shingles s obvious explanation with neg w/u by Cards (Dr Percival Spanish who d/c'd cardizem due to bradycardia 11/08/20)  with restrictive changes only by pfts 12/13/20 so referred to pulmonary clinic 12/15/2020 by Dr   Sharlet Salina with pulse rate  In low 30's on initial eval. ? ?HPI: ?88 year old female, never smoked. PMH significant for HTN, afib, aneurysm of pulmonary artery, GERD, type 2 diabetes, alzheimer's dementia, stage 3 CKD. Patient of Dr. Melvyn Novas. ? ?Previous [facility] pulmonary encounter: ?12/15/2020  Pulmonary/ 1st office eval/[facility]  ?Chief Complaint  ?Patient presents with  ? Consult  ?  Shortness of breath at rest at times and with activity  ? Dyspnea:  Room to room since oct/nov assoc with wheezing  ?Cough: none  ?Sleep:  Bed is flat/ 2 pillows ?SABA use: does not have neb machined, just the solution, worse p saba for pfts, worse started anoro. ? ?   ?11/28/2022Volanda Napoleon, NP ?Patient presents today for overdue follow-up. During her last visit with Dr. Melvyn Novas in January 2022 she was in complete heart block and transported to [facility] via EMS. She saw PCP on 11/2 and reported symptoms of dyspnea with exertion, BNP was elevated at 304 and CXR showed enlargement of cardiac silhouette with pulmonary vascular congestion with minimal subsegmental atelectasis right base other wise lungs were clear.  PCP increased lasix for 2-3 days.  ? ?She is accompanied by her granddaughter today and her daughter is on the phone. She has noted some shortness of breath over the last several months. She becomes dyspneic walking from the kitchen to another room. She can not stand up for very long and  needs to rest. She has a np congested cough. No chest tightness or wheezing. She is no longer on ANORO. She was a never smoker. PFTs have shown restriction. Albuterol does help. No leg swelling.  ? ? ?11/29/2021  f/u ov/[facility]ert re: doe   maint on no pulmonary  meds/ not taking pepcid as rec  ?No chief complaint on file. ? Dyspnea:  room to room  ?Cough: none  ?Sleeping: flat bed  ?SABA use: has hfa only not neb ?02: none  ? ?Onset Dec 2020 ?- Echo 04/18/20  ?1. Left ventricular ejection fraction, by estimation, is 60 to 65%. The  ?left ventricle has normal function but is severe left ventricular hypertrophy of  ?the basal-septal segmen  ? 2. Right ventricular systolic function is normal. The right ventricular  ?size is normal. There is mildly elevated pulmonary artery systolic  ?pressure. Nl LA and RA and The inferior vena cava is normal in size with greater than 50%  ?respiratory variability, suggesting right atrial pressure of 3 mmHg.  ?- pfts 12/13/20 restrictive with ERV undetectable  ?- 11/29/2021   Walked on RA  x  1  lap(s) =  approx 250  ft  @ slow / shuffling pace, stopped due to tire, no sob  with lowest 02 sats 98% ? ?  ?02/15/2022- Interim hx  ?Patient presents today for acute OV. She reports increased shortness of breath. Referred back by  Dr. Percival Spanish. She has had dyspnea symptoms for the last year, more noticeable the last 5-6 days. She gets out of breath and tired with any movement. She is not getting much activity. She has a rare dry cough. CXR in Nov 2022 showed pulm vascular congestion, traction bronchiectasis on CT imaging in 2021. No particular improvement with albuterol. PFTs in the past showed restrictive defect. Denies wheezing, chest tightness ? ? ? ?Allergies  ?Allergen Reactions  ? Dicyclomine Hcl Other (See Comments)  ?  Severe eye pain  ? Lisinopril Cough  ? Aspirin Other (See Comments)  ?  Unknown reaction  ? ? ?Immunization History  ?Administered Date(s) Administered  ? Fluad Quad(high Dose 65+)  09/11/2019, 10/26/2019, 11/10/2020, 09/27/2021  ? Influenza Whole 09/07/2005, 09/16/2008  ? Influenza, High Dose Seasonal PF 11/20/2016, 08/06/2017, 08/12/2018  ? Influenza,inj,Quad PF,6+ Mos 08/31/2013, 09/02/2014, 09/06/2015  ? Moderna Sars-Covid-2 Vaccination 02/03/2020, 03/10/2020  ? Pneumococcal Conjugate-13 03/24/2015  ? Pneumococcal Polysaccharide-23 10/30/2006, 12/02/2019  ? Td 05/25/1996, 03/02/2002  ? Tdap 03/24/2015  ? ? ?Past Medical History:  ?Diagnosis Date  ? Arthritis   ? Atrial fibrillation ([place])   ? Chronic neck pain   ? Diverticulosis of colon (without mention of hemorrhage)   ? Family history of malignant neoplasm of gastrointestinal tract   ? Gastritis   ? GERD (gastroesophageal reflux disease)   ? Hiatal hernia   ? HTN (hypertension)   ? LBP (low back pain)   ? Memory problem   ? Spondylosis   ? Thrombocytopenia ([place])   ? Type II or unspecified type diabetes mellitus without mention of complication, not stated as uncontrolled   ? ? ?Tobacco History: ?Social History  ? ?Tobacco Use  ?Smoking Status Never  ?Smokeless Tobacco Never  ? ?Counseling given: Not Answered ? ? ?Outpatient Medications Prior to Visit  ?Medication Sig Dispense Refill  ? acetaminophen (TYLENOL) 650 MG CR tablet Take 1,300 mg by mouth every 8 (eight) hours as needed for pain.    ? albuterol (PROVENTIL) (2.5 MG/3ML) 0.083% nebulizer solution Take 3 mLs (2.5 mg total) by nebulization every 6 (six) hours as needed for wheezing or shortness of breath. 150 mL 1  ? albuterol (VENTOLIN HFA) 108 (90 Base) MCG/ACT inhaler Inhale 2 puffs into the lungs every 8 (eight) hours. 8 g 2  ? ALPRAZolam (XANAX) 0.25 MG tablet Take 1 tablet by mouth twice daily as needed for anxiety 60 tablet 3  ? amLODipine (NORVASC) 5 MG tablet Take 1 tablet (5 mg total) by mouth daily. 90 tablet 0  ? Capsaicin 0.075 % STCK .075% topically twice per day 1 g 1  ? cetirizine (ZYRTEC) 10 MG tablet TAKE ONE TABLET BY MOUTH ONCE DAILY 30 tablet 11  ? donepezil  (ARICEPT) 5 MG tablet TAKE 1 TABLET BY MOUTH AT BEDTIME 90 tablet 0  ? famotidine (PEPCID) 20 MG tablet Take 1 tablet (20 mg total) by mouth 2 (two) times daily. 60 tablet 2  ? furosemide (LASIX) 20 MG tablet TAKE 1 TABLET BY MOUTH AS NEEDED FOR EDEMA 30 tablet 6  ? haloperidol (HALDOL) 0.5 MG tablet TAKE 1 TABLET BY MOUTH EVERY 8 HOURS AS NEEDED FOR  AGITATION 90 tablet 0  ? hydrochlorothiazide (MICROZIDE) 12.5 MG capsule Take 1 capsule (12.5 mg total) by mouth daily. 90 capsule 0  ? lidocaine (LIDODERM) 5 % Place 2 patches onto the skin daily. Remove & Discard patch within 12 hours or as directed by MD (Patient taking differently: Place 2 patches  onto the skin daily. Remove & Discard patch within 12 hours or as directed by MD prn) 60 patch 0  ? losartan (COZAAR) 50 MG tablet Take 1 tablet by mouth once daily 90 tablet 0  ? mirtazapine (REMERON) 30 MG tablet Take 1 tablet (30 mg total) by mouth at bedtime. 90 tablet 1  ? Rivaroxaban (XARELTO) 15 MG TABS tablet Take 1 tablet (15 mg total) by mouth daily with supper. 60 tablet 5  ? VITAMIN D, CHOLECALCIFEROL, PO Take 1 tablet by mouth daily. gummy    ? doxycycline (VIBRA-TABS) 100 MG tablet Take 1 tablet (100 mg total) by mouth 2 (two) times daily. 20 tablet 0  ? ?No facility-administered medications prior to visit.  ? ? ? ? ?Review of Systems ? ?Review of Systems ? ? ?Physical Exam ? ?BP 118/74 (BP Location: Left Arm, Patient Position: Sitting, Cuff Size: Normal)   Pulse 62   Temp 98.1 ?F (36.7 ?C) (Oral)   Ht 5\' 5"  (1.651 m)   Wt 154 lb (69.9 kg)   SpO2 98%   BMI 25.63 kg/m?  ?Physical Exam  ? ?Lab Results: ? ?CBC ?   ?Component Value Date/Time  ? WBC 5.6 02/15/2022 1151  ? RBC 4.41 02/15/2022 1151  ? HGB 12.2 02/15/2022 1151  ? HGB 12.2 11/08/2020 1516  ? HCT 38.4 02/15/2022 1151  ? HCT 38.0 11/08/2020 1516  ? PLT 152.0 02/15/2022 1151  ? PLT CANCELED 11/08/2020 1516  ? MCV 87.1 02/15/2022 1151  ? MCV 84 11/08/2020 1516  ? MCH 26.6 03/14/2021 1155  ? MCHC  31.7 02/15/2022 1151  ? RDW 16.8 (H) 02/15/2022 1151  ? RDW 14.6 11/08/2020 1516  ? LYMPHSABS 1.3 03/14/2021 1155  ? MONOABS 0.6 03/14/2021 1155  ? EOSABS 0.1 03/14/2021 1155  ? BASOSABS 0.0 03/14/2021 1155  ? ?

## 2022-02-15 NOTE — Patient Instructions (Addendum)
Shortness of breath felt to be multifactorial d.t age, deconditioning, heart failure  ?No evidence of asthma or COPD ?We will get CXR today, if abnormal we will get repeat CT chest  ? ?Orders: ?CXR today  ?Labs  ? ?Follow-up: ?As needed if symptoms worsen with Dr. Sherene Sires ?

## 2022-02-19 ENCOUNTER — Other Ambulatory Visit: Payer: Self-pay | Admitting: *Deleted

## 2022-02-19 MED ORDER — POTASSIUM CHLORIDE CRYS ER 20 MEQ PO TBCR: 20.0000 meq | EXTENDED_RELEASE_TABLET | Freq: Two times a day (BID) | ORAL | 0 refills | Status: DC

## 2022-02-19 NOTE — Progress Notes (Signed)
CXR showed clear lungs, borderline cardiomegaly which is stable. Totuosity of thoracic aorta is stable. Keep apt with cardiology in apt. Call if symptoms worsen

## 2022-02-19 NOTE — Progress Notes (Signed)
BNP is elevated. Kidney function is decreased but baseline. Potassium is low. Please send in 20meq KCL and have her take this twice daily x 3 days. Needs repeat labs on Friday. (Please send RX).

## 2022-02-23 ENCOUNTER — Other Ambulatory Visit: Payer: Self-pay | Admitting: Primary Care

## 2022-02-23 ENCOUNTER — Other Ambulatory Visit (INDEPENDENT_AMBULATORY_CARE_PROVIDER_SITE_OTHER): Payer: Medicare HMO

## 2022-02-23 ENCOUNTER — Telehealth: Payer: Self-pay | Admitting: Internal Medicine

## 2022-02-23 DIAGNOSIS — I482 Chronic atrial fibrillation, unspecified: Secondary | ICD-10-CM

## 2022-02-23 DIAGNOSIS — N1831 Chronic kidney disease, stage 3a: Secondary | ICD-10-CM

## 2022-02-23 DIAGNOSIS — M7989 Other specified soft tissue disorders: Secondary | ICD-10-CM

## 2022-02-23 LAB — BASIC METABOLIC PANEL
BUN: 10 mg/dL (ref 6–23)
CO2: 26 mEq/L (ref 19–32)
Calcium: 9.1 mg/dL (ref 8.4–10.5)
Chloride: 104 mEq/L (ref 96–112)
Creatinine, Ser: 1.54 mg/dL — ABNORMAL HIGH (ref 0.40–1.20)
GFR: 29.46 mL/min — ABNORMAL LOW (ref >60.00)
Glucose, Bld: 108 mg/dL — ABNORMAL HIGH (ref 70–99)
Potassium: 3.6 mEq/L (ref 3.5–5.1)
Sodium: 141 mEq/L (ref 135–145)

## 2022-02-23 LAB — BRAIN NATRIURETIC PEPTIDE: Pro B Natriuretic peptide (BNP): 292 pg/mL — ABNORMAL HIGH (ref 0.0–100.0)

## 2022-02-23 NOTE — Telephone Encounter (Signed)
Called and spoke with patient's daughter [name]. She verbalized understanding of results. She will reach out to cardiology.  ? ?Nothing further needed at time of call.  ?

## 2022-02-23 NOTE — Assessment & Plan Note (Addendum)
-   Patient was seen by Dr. Melvyn Novas in January 2023, dyspnea largely felt to be d.t deconditioning and diastolic dysfunction. No evidence of asthma. PFTs showed restrictive defect. CXR in Nov 2022 showed pulm vascular congestion, she had traction bronchiectasis on CT imaging in 2021. Repeat chest xray today showed clear lungs, borderline cardiomegaly which was stable. Totuosity of thoracic aorta is stable. BNP was elevated > 200. Dyspnea still felt to be related to age, deconditioning and underlying cardiac disease. Advised she keep follow-up apt with cardiology.  ?

## 2022-02-23 NOTE — Progress Notes (Signed)
Please let patient know her potassium has normalized. Kidney function is stable. BNP remains elevated. If still having sob needs to call cardiology, they need to adjust her diuretics and manage this. If unable to get visit or in contact with them let us know

## 2022-02-23 NOTE — Progress Notes (Signed)
Labs ordered.

## 2022-03-01 ENCOUNTER — Other Ambulatory Visit: Payer: Self-pay | Admitting: Internal Medicine

## 2022-03-20 ENCOUNTER — Other Ambulatory Visit: Payer: Self-pay | Admitting: *Deleted

## 2022-03-20 NOTE — Patient Outreach (Signed)
[facility]) Care Management ? ?03/20/2022 ? ?Alyssa Lindsey ?1929-09-26 ?[id number] ? ? ?[place] coordination-incoming call, referral to external vendor ? ?RN CM received a call from [place], daughter who left a message ?RN CM returned a call  ?Alyssa Lindsey voiced concern about pt with noted increase in tremors, dehydration and poor appetite. Pt reported to be on xanax and haldol which Alyssa Lindsey reports is managing her behaviors ? ?RN CM discussed haldol and xanax multiple uses for behavior and tremors ?RN CM suggested creativity with intake of food/fluids in small warm increments of items that the patient prefers, & offering high seasonal liquid foods/fruits like watermelon, cantaloupes,cucumbers. Tea, coffee,  lemonade (Splenda) ?RN CM encouraged daughter to update pcp on pt concerns, request labs to check for hydration, urinary tract infection (UTI), electrolyte imbalances, medication dose change needs or further consults with specialists like neurologists ?Discussed and answered questions about parkinson, post herpetic neuralgia ?Discussed external care management/pharmacy vendors availability and pending external vendor referral  ? ?Plan ?THN RN CM will refer pt to external care management vendor staff as discussed with daughter Alyssa Lindsey ?Welcome a return call prn  ?Note routed to pcp  ? ? ?Ardath Lepak L. Lavina Hamman, RN, BSN, CCM ?[place] Management Care Coordinator ?Office number [phone] ? ?

## 2022-03-21 ENCOUNTER — Ambulatory Visit (INDEPENDENT_AMBULATORY_CARE_PROVIDER_SITE_OTHER): Payer: Medicare HMO

## 2022-03-21 DIAGNOSIS — I442 Atrioventricular block, complete: Secondary | ICD-10-CM | POA: Diagnosis not present

## 2022-03-21 LAB — CUP PACEART REMOTE DEVICE CHECK
Battery Remaining Longevity: 144 mo
Battery Voltage: 3.05 V
Brady Statistic RV Percent Paced: 67.39 %
Date Time Interrogation Session: 20230426001354
Implantable Lead Implant Date: 20220121
Implantable Lead Location: 753860
Implantable Lead Model: 3830
Implantable Pulse Generator Implant Date: 20220121
Lead Channel Impedance Value: 380 Ohm
Lead Channel Impedance Value: 513 Ohm
Lead Channel Pacing Threshold Amplitude: 1 V
Lead Channel Pacing Threshold Pulse Width: 0.4 ms
Lead Channel Sensing Intrinsic Amplitude: 31 mV
Lead Channel Sensing Intrinsic Amplitude: 31 mV
Lead Channel Setting Pacing Amplitude: 2.5 V
Lead Channel Setting Pacing Pulse Width: 0.4 ms
Lead Channel Setting Sensing Sensitivity: 0.9 mV

## 2022-03-23 ENCOUNTER — Ambulatory Visit (INDEPENDENT_AMBULATORY_CARE_PROVIDER_SITE_OTHER): Payer: Medicare HMO | Admitting: Internal Medicine

## 2022-03-23 ENCOUNTER — Ambulatory Visit (INDEPENDENT_AMBULATORY_CARE_PROVIDER_SITE_OTHER): Payer: Medicare HMO

## 2022-03-23 ENCOUNTER — Encounter: Payer: Self-pay | Admitting: Internal Medicine

## 2022-03-23 VITALS — BP 122/74 | HR 68 | Resp 18 | Ht 65.0 in | Wt 150.2 lb

## 2022-03-23 DIAGNOSIS — N1831 Chronic kidney disease, stage 3a: Secondary | ICD-10-CM

## 2022-03-23 DIAGNOSIS — M109 Gout, unspecified: Secondary | ICD-10-CM | POA: Diagnosis not present

## 2022-03-23 DIAGNOSIS — R0609 Other forms of dyspnea: Secondary | ICD-10-CM | POA: Diagnosis not present

## 2022-03-23 DIAGNOSIS — D696 Thrombocytopenia, unspecified: Secondary | ICD-10-CM | POA: Diagnosis not present

## 2022-03-23 DIAGNOSIS — R634 Abnormal weight loss: Secondary | ICD-10-CM | POA: Diagnosis not present

## 2022-03-23 DIAGNOSIS — G301 Alzheimer's disease with late onset: Secondary | ICD-10-CM | POA: Diagnosis not present

## 2022-03-23 DIAGNOSIS — F02818 Dementia in other diseases classified elsewhere, unspecified severity, with other behavioral disturbance: Secondary | ICD-10-CM

## 2022-03-23 DIAGNOSIS — R69 Illness, unspecified: Secondary | ICD-10-CM | POA: Diagnosis not present

## 2022-03-23 DIAGNOSIS — R410 Disorientation, unspecified: Secondary | ICD-10-CM

## 2022-03-23 DIAGNOSIS — R82998 Other abnormal findings in urine: Secondary | ICD-10-CM

## 2022-03-23 DIAGNOSIS — F4323 Adjustment disorder with mixed anxiety and depressed mood: Secondary | ICD-10-CM

## 2022-03-23 LAB — URINALYSIS, ROUTINE W REFLEX MICROSCOPIC
Bilirubin Urine: NEGATIVE
Hgb urine dipstick: NEGATIVE
Ketones, ur: NEGATIVE
Leukocytes,Ua: NEGATIVE
Nitrite: NEGATIVE
Specific Gravity, Urine: <=1.005 — AB (ref 1.000–1.030)
Total Protein, Urine: NEGATIVE
Urine Glucose: NEGATIVE
Urobilinogen, UA: 0.2 (ref 0.0–1.0)
pH: 6 (ref 5.0–8.0)

## 2022-03-23 LAB — COMPREHENSIVE METABOLIC PANEL
ALT: 8 U/L (ref 0–35)
AST: 19 U/L (ref 0–37)
Albumin: 3.6 g/dL (ref 3.5–5.2)
Alkaline Phosphatase: 64 U/L (ref 39–117)
BUN: 12 mg/dL (ref 6–23)
CO2: 30 mEq/L (ref 19–32)
Calcium: 8.9 mg/dL (ref 8.4–10.5)
Chloride: 100 mEq/L (ref 96–112)
Creatinine, Ser: 1.46 mg/dL — ABNORMAL HIGH (ref 0.40–1.20)
GFR: 31.39 mL/min — ABNORMAL LOW (ref >60.00)
Glucose, Bld: 136 mg/dL — ABNORMAL HIGH (ref 70–99)
Potassium: 3.1 mEq/L — ABNORMAL LOW (ref 3.5–5.1)
Sodium: 140 mEq/L (ref 135–145)
Total Bilirubin: 0.6 mg/dL (ref 0.2–1.2)
Total Protein: 7.2 g/dL (ref 6.0–8.3)

## 2022-03-23 LAB — CBC
HCT: 35.5 % — ABNORMAL LOW (ref 36.0–46.0)
Hemoglobin: 11.4 g/dL — ABNORMAL LOW (ref 12.0–15.0)
MCHC: 32 g/dL (ref 30.0–36.0)
MCV: 88 fl (ref 78.0–100.0)
Platelets: 143 10*3/uL — ABNORMAL LOW (ref 150.0–400.0)
RBC: 4.04 Mil/uL (ref 3.87–5.11)
RDW: 15.2 % (ref 11.5–15.5)
WBC: 7.4 10*3/uL (ref 4.0–10.5)

## 2022-03-23 LAB — TSH: TSH: 4.17 u[IU]/mL (ref 0.35–5.50)

## 2022-03-23 LAB — VITAMIN B12: Vitamin B-12: 176 pg/mL — ABNORMAL LOW (ref 211–911)

## 2022-03-23 LAB — VITAMIN D 25 HYDROXY (VIT D DEFICIENCY, FRACTURES): VITD: 30.61 ng/mL (ref 30.00–100.00)

## 2022-03-23 MED ORDER — PREDNISONE 20 MG PO TABS
40.0000 mg | ORAL_TABLET | Freq: Every day | ORAL | 0 refills | Status: DC
Start: 2022-03-23 — End: 2022-10-27

## 2022-03-23 MED ORDER — NITROFURANTOIN MONOHYD MACRO 100 MG PO CAPS: 100.0000 mg | ORAL_CAPSULE | Freq: Two times a day (BID) | ORAL | 0 refills | Status: DC

## 2022-03-23 NOTE — Assessment & Plan Note (Signed)
Suspect some of this 13 pound weight loss in 4-5 months is related to poor oral intake in the last few weeks. She does have dementia and will rule out infection/reversible cause. Treat as appropriate. Checking CBC, CMP, B12, vitamin D, TSH, chest x-ray, urine u/a and culture. ?

## 2022-03-23 NOTE — Assessment & Plan Note (Signed)
Overall stable and no new cough. Given some clearing throat with meals will check CXR today to rule out new infection given change in mental status. ?

## 2022-03-23 NOTE — Assessment & Plan Note (Signed)
Checking CBC for stability.  

## 2022-03-23 NOTE — Assessment & Plan Note (Signed)
Rx prednisone 40 mg daily for 5 days. She does have new dark urine and poor oral intake making dehydration and hyperuricemia possible as a trigger.  ?

## 2022-03-23 NOTE — Assessment & Plan Note (Signed)
Concern with new change in appetite that she could have infection. Ruling out with CXR and U/A and culture today. Checking CBC and CMP. If no infection we will work to reduce sedating medications to see if this is reversible. Conversation with daughters today regarding potential change in prognosis depending on results today.  ?

## 2022-03-23 NOTE — Assessment & Plan Note (Signed)
Concerning and need to rule out infection. Checking CBC, U/A, urine culture and CXR. Treat as appropriate. She is unable to urinate in the office so empiric macrobid was prescribed and they will still try to collect sample if possible.  ?

## 2022-03-23 NOTE — Patient Instructions (Signed)
We will check the labs and the chest x-ray today. We have sent in prednisone to take 2 pills daily for 5 days to help with the likely gout in the foot.  ? ?We have sent in an antibiotic to cover for urinary infection to see if this helps. Still try to collect a urine in the next few days if possible to check.  ? ?We have sent in macrobid to take 1 pill twice a day for 1 week.  ?

## 2022-03-23 NOTE — Progress Notes (Signed)
? ?  Subjective:  ? ?Patient ID: GENEVIENE TESCH, female    DOB: 1929-08-28, 88 y.o.   MRN: [id number] ? ?HPI ?The patient is a 88 YO female coming in for concerns with daughters.  ? ?Review of Systems  ?Unable to perform ROS: Dementia  ?Constitutional:  Positive for activity change, appetite change and unexpected weight change.  ?Respiratory:  Positive for shortness of breath.   ?Genitourinary:  Positive for decreased urine volume. Negative for dysuria.  ?Musculoskeletal:  Positive for arthralgias, gait problem, joint swelling and myalgias.  ? ?Objective:  ?Physical Exam ?Constitutional:   ?   Appearance: She is well-developed.  ?   Comments: Chronically ill appearing  ?HENT:  ?   Head: Normocephalic and atraumatic.  ?Cardiovascular:  ?   Rate and Rhythm: Normal rate and regular rhythm.  ?Pulmonary:  ?   Effort: Pulmonary effort is normal. No respiratory distress.  ?   Breath sounds: Normal breath sounds. No wheezing or rales.  ?   Comments: Breathing stable from prior ?Abdominal:  ?   General: Bowel sounds are normal. There is no distension.  ?   Palpations: Abdomen is soft.  ?   Tenderness: There is no abdominal tenderness. There is no rebound.  ?Musculoskeletal:     ?   General: Swelling and tenderness present.  ?   Cervical back: Normal range of motion.  ?   Right lower leg: Edema present.  ?   Left lower leg: Edema present.  ?   Comments: Right great toe with redness and tenderness to palpation consistent with gout or pseudogout, left more than right edema pitting to knees bilaterally, tremor at rest in the right leg and arm leg more prominent.  ?Skin: ?   General: Skin is warm and dry.  ?   Comments: Decreased cap refill time  ?Neurological:  ?   Mental Status: She is alert.  ?   Coordination: Coordination abnormal.  ?   Comments: Responds to questions with appropriate answers, can follow simple commands  ? ? ?Vitals:  ? 03/23/22 0915  ?BP: 122/74  ?Pulse: 68  ?Resp: 18  ?SpO2: 98%  ?Weight: 150 lb 3.2 oz (68.1  kg)  ?Height: 5\' 5"  (1.651 m)  ? ?This visit occurred during the SARS-CoV-2 public health emergency.  Safety protocols were in place, including screening questions prior to the visit, additional usage of staff PPE, and extensive cleaning of exam room while observing appropriate contact time as indicated for disinfecting solutions.  ? ?Assessment & Plan:  ?Visit time 25 minutes in face to face communication with patient and coordination of care, additional 15 minutes spent in record review, coordination or care, ordering tests, communicating/referring to other healthcare professionals, documenting in medical records all on the same day of the visit for total time 40 minutes spent on the visit.  ? ?

## 2022-03-23 NOTE — Assessment & Plan Note (Signed)
Checking CMP today and concern for AKI given poor oral intake for several weeks and minimal urine which is dark and strong smelling. Advised to push fluids of any kind. ?

## 2022-03-23 NOTE — Assessment & Plan Note (Signed)
She is having more tremor noticeable on exam today in the lower right leg. Could be related to haldol usage especially since there is concern for AKI she could have accumulation. If AKI present we will reduce xanax and haldol temporarily.  ?

## 2022-03-24 LAB — URINE CULTURE

## 2022-03-25 LAB — URINE CULTURE

## 2022-03-26 ENCOUNTER — Other Ambulatory Visit: Payer: Self-pay | Admitting: Internal Medicine

## 2022-03-26 MED ORDER — SULFAMETHOXAZOLE-TRIMETHOPRIM 800-160 MG PO TABS: 1.0000 | ORAL_TABLET | Freq: Two times a day (BID) | ORAL | 0 refills | Status: AC

## 2022-03-27 ENCOUNTER — Other Ambulatory Visit: Payer: Self-pay | Admitting: Internal Medicine

## 2022-03-27 MED ORDER — MEGESTROL ACETATE 20 MG PO TABS: 20.0000 mg | ORAL_TABLET | Freq: Every day | ORAL | 0 refills | Status: DC

## 2022-03-28 ENCOUNTER — Telehealth: Payer: Self-pay | Admitting: Internal Medicine

## 2022-03-28 ENCOUNTER — Telehealth: Payer: Self-pay

## 2022-03-28 ENCOUNTER — Telehealth: Payer: Self-pay | Admitting: *Deleted

## 2022-03-28 ENCOUNTER — Other Ambulatory Visit: Payer: Self-pay | Admitting: Internal Medicine

## 2022-03-28 NOTE — Chronic Care Management (AMB) (Signed)
?  Care Management  ? ?Note ? ?03/28/2022 ?Name: [name] MRN: [id number] DOB: [birth year] ? ?[name] is a 88 y.o. year old female who is a primary care patient of Myrlene Broker, MD. I reached out to [name] by phone today offer care coordination services.  ? ?[name] was given information about care management services today including:  ?Care management services include personalized support from designated clinical staff supervised by her physician, including individualized plan of care and coordination with other care providers ?24/7 contact phone numbers for assistance for urgent and routine care needs. ?The patient may stop care management services at any time by phone call to the office staff. ? ?Daughter Soy,[name] DPR on file  verbally agreed to assistance and services provided by embedded care coordination/care management team today. ? ?Follow up plan: ?Telephone appointment with care management team member scheduled for:03/30/22 ? ?[name]  ?Care Guide, Embedded Care Coordination ?  Care Management  ?Direct Dial: [phone] ? ?

## 2022-03-28 NOTE — Chronic Care Management (AMB) (Signed)
?  Chronic Care Management  ? ?Note ? ?03/28/2022 ?Name: DENICE CARDON MRN: [id number] DOB: [birth year] ? ?Alyssa Lindsey is a 89 y.o. year old female who is a primary care patient of Myrlene Broker, MD. I reached out to [name] by phone today in response to a referral sent by Ms. Willis Modena Abdulla's PCP, Myrlene Broker, MD.  ? ?[name] was given information about Chronic Care Management services today including:  ?CCM service includes personalized support from designated clinical staff supervised by her physician, including individualized plan of care and coordination with other care providers ?24/7 contact phone numbers for assistance for urgent and routine care needs. ?Service will only be billed when office clinical staff spend 20 minutes or more in a month to coordinate care. ?Only one practitioner may furnish and bill the service in a calendar month. ?The patient may stop CCM services at any time (effective at the end of the month) by phone call to the office staff. ? ? ?[name] HUNTLEY/DAUGHTER verbally agreed to assistance and services provided by embedded care coordination/care management team today. ? ?Follow up plan: ?REFERAL/ NO COPAY ? ?Tatjana Dellinger ?Upstream Scheduler   ?

## 2022-03-28 NOTE — Telephone Encounter (Signed)
Xarelto 15mg  samples place at front desk pt is out of medication, unable to refill until 04/02/2022 ? ?06/02/2022 ?Exp: 07/27/2023 ?#7 tablets  ? ?07/29/2023, PharmD ?Clinical Pharmacist, [facility] ? ?

## 2022-03-30 ENCOUNTER — Telehealth: Payer: Self-pay

## 2022-03-30 ENCOUNTER — Ambulatory Visit: Payer: Medicare HMO | Admitting: *Deleted

## 2022-03-30 DIAGNOSIS — R531 Weakness: Secondary | ICD-10-CM

## 2022-03-30 DIAGNOSIS — B0229 Other postherpetic nervous system involvement: Secondary | ICD-10-CM

## 2022-03-30 NOTE — Progress Notes (Signed)
? ? ?Chronic Care Management ?Pharmacy Assistant  ? ?Name: Alyssa Lindsey  MRN: [id number] DOB: 1929-08-24 ? ?Alyssa Lindsey is an 88 y.o. year old female who presents for his initial CCM visit with the clinical pharmacist. ? ?Recent office visits:  ?03/23/22 Myrlene Broker, MD-PCP (Dark urine,increased tremors)Blood work ordered; Medication changes: Nitrofurantoin Monohyd Macro 100 mg, prednisone 40 mg ?01/16/22 Corwin Levins, MD-Internal Medicine (Acute cough) No orders; Medication changes: Doxycycline Hyclate 100 mg, Hydrocodone Bit-Homatrop Mbr- 5-1.5 mg ?Recent consult visits:  ?02/15/22 Glenford Bayley, NP-Pulmonary Disease (Dyspnea) Orders: Labs; Medication changes: none ?02/06/22 Pallative Care Home Visit: Bilateral lower extremity edema ?02/01/22 Rollene Rotunda, MD-Cardiology (Chronic atrial fibrillation, SOB) No orders or med changes ?01/18/22  Pallative Care Home Visit: Acute cough  ?12/05/21 Pallative Care Home Visit:Palliative follow up ?11/29/21 Nyoka Cowden, MD-Pulmonary Disease (Dyspnea) No orders; medication changes: none ?10/23/21 Glenford Bayley, NP-Pulmonary Disease (Dyspnea) Orders: Labs; Medication changes: umeclidinium vilanternol 62.5-25 mcg dicontinued by provider ?Hospital visits:  ?None in previous 6 months ? ?Medications: ?Outpatient Encounter Medications as of 03/30/2022  ?Medication Sig  ? acetaminophen (TYLENOL) 650 MG CR tablet Take 1,300 mg by mouth every 8 (eight) hours as needed for pain.  ? albuterol (PROVENTIL) (2.5 MG/3ML) 0.083% nebulizer solution Take 3 mLs (2.5 mg total) by nebulization every 6 (six) hours as needed for wheezing or shortness of breath.  ? albuterol (VENTOLIN HFA) 108 (90 Base) MCG/ACT inhaler Inhale 2 puffs into the lungs every 8 (eight) hours.  ? ALPRAZolam (XANAX) 0.25 MG tablet Take 1 tablet by mouth twice daily as needed for anxiety  ? amLODipine (NORVASC) 5 MG tablet Take 1 tablet (5 mg total) by mouth daily.  ? Capsaicin 0.075 % STCK .075%  topically twice per day  ? cetirizine (ZYRTEC) 10 MG tablet TAKE ONE TABLET BY MOUTH ONCE DAILY  ? donepezil (ARICEPT) 5 MG tablet TAKE 1 TABLET BY MOUTH AT BEDTIME  ? famotidine (PEPCID) 20 MG tablet Take 1 tablet (20 mg total) by mouth 2 (two) times daily.  ? furosemide (LASIX) 20 MG tablet TAKE 1 TABLET BY MOUTH AS NEEDED FOR EDEMA  ? haloperidol (HALDOL) 0.5 MG tablet TAKE 1 TABLET BY MOUTH EVERY 8 HOURS AS NEEDED FOR  AGITATION  ? hydrochlorothiazide (MICROZIDE) 12.5 MG capsule Take 1 capsule (12.5 mg total) by mouth daily.  ? lidocaine (LIDODERM) 5 % Place 2 patches onto the skin daily. Remove & Discard patch within 12 hours or as directed by MD (Patient taking differently: Place 2 patches onto the skin daily. Remove & Discard patch within 12 hours or as directed by MD prn)  ? losartan (COZAAR) 50 MG tablet Take 1 tablet by mouth once daily  ? megestrol (MEGACE) 20 MG tablet Take 1 tablet (20 mg total) by mouth daily.  ? mirtazapine (REMERON) 30 MG tablet Take 1 tablet (30 mg total) by mouth at bedtime.  ? potassium chloride SA (KLOR-CON M) 20 MEQ tablet Take 1 tablet (20 mEq total) by mouth 2 (two) times daily.  ? predniSONE (DELTASONE) 20 MG tablet Take 2 tablets (40 mg total) by mouth daily with breakfast.  ? Rivaroxaban (XARELTO) 15 MG TABS tablet Take 1 tablet (15 mg total) by mouth daily with supper.  ? sulfamethoxazole-trimethoprim (BACTRIM DS) 800-160 MG tablet Take 1 tablet by mouth 2 (two) times daily for 4 days.  ? VITAMIN D, CHOLECALCIFEROL, PO Take 1 tablet by mouth daily. gummy  ? ?No facility-administered encounter medications on file as  of 03/30/2022.  ? ?Medication List: ?acetaminophen (TYLENOL) 650 MG CR tablet ?albuterol (PROVENTIL) (2.5 MG/3ML) 0.083% nebulizer solution ?albuterol (VENTOLIN HFA) 108 (90 Base) MCG/ACT inhaler-last fill 01/09/22 33 ds ?ALPRAZolam (XANAX) 0.25 MG tablet-last fill 03/17/22 30 ds ?amLODipine (NORVASC) 5 MG tablet-last fill 03/02/22 90 ds ?Capsaicin 0.075 %  STCK ?cetirizine (ZYRTEC) 10 MG tablet ?donepezil (ARICEPT) 5 MG tablet-last fill 03/01/22 90 ds ?famotidine (PEPCID) 20 MG tablet-last fill 09/18/19 ?furosemide (LASIX) 20 MG tablet-last fill 03/05/22 30  ?haloperidol (HALDOL) 0.5 MG tablet-last fill 01/10/22 ?hydrochlorothiazide (MICROZIDE) 12.5 MG capsule-last fill 08/11/21 90 ds ?lidocaine (LIDODERM) 5 %-last fill 09/06/20 30 ds ?losartan (COZAAR) 50 MG tablet-last fill 01/09/22 90 ds ?megestrol (MEGACE) 20 MG tablet-last fill 03/27/22 90 ds ?mirtazapine (REMERON) 30 MG tablet-last fill 02/09/22 90 ds ?potassium chloride SA (KLOR-CON M) 20 MEQ tablet-last fill 02/19/22 3 ds ?predniSONE (DELTASONE) 20 MG tablet-last fill 03/23/22 5 ds ?Rivaroxaban (XARELTO) 15 MG TABS tablet-last fill 02/09/22 60 ds ?sulfamethoxazole-trimethoprim (BACTRIM DS) 800-160 MG tablet ?VITAMIN D, CHOLECALCIFEROL, PO ? ?Care Gaps: ?Colonoscopy-NA ?Diabetic Foot Exam-09/27/21 ?Mammogram-NA ?Ophthalmology-NA ?Dexa Scan - NA ?Annual Well Visit - NA ?Micro albumin-09/11/19 ?Hemoglobin A1c- 09/27/21 ? ?Star Rating Drugs: ?Losartan 50 mg-last fill 01/09/22 90 ds ? ?Velvet Bathe ?Clinical Pharmacist Assistant ?[phone]  ?

## 2022-04-02 ENCOUNTER — Other Ambulatory Visit: Payer: Self-pay | Admitting: Internal Medicine

## 2022-04-03 ENCOUNTER — Ambulatory Visit: Payer: Medicare HMO | Admitting: Licensed Clinical Social Worker

## 2022-04-03 ENCOUNTER — Telehealth: Payer: Medicare HMO

## 2022-04-03 ENCOUNTER — Encounter: Payer: Self-pay | Admitting: Licensed Clinical Social Worker

## 2022-04-03 DIAGNOSIS — I10 Essential (primary) hypertension: Secondary | ICD-10-CM

## 2022-04-03 DIAGNOSIS — Z7189 Other specified counseling: Secondary | ICD-10-CM

## 2022-04-03 DIAGNOSIS — F02818 Dementia in other diseases classified elsewhere, unspecified severity, with other behavioral disturbance: Secondary | ICD-10-CM

## 2022-04-03 NOTE — Telephone Encounter (Signed)
No last refill on controls.. Pls advise.Marland KitchenJohny Lindsey ?

## 2022-04-03 NOTE — Chronic Care Management (AMB) (Signed)
?  Chronic Care Management  ? Clinical Social Work Note ? ?04/03/2022 ?Name: Alyssa Lindsey MRN: [id number] DOB: 1929-09-04 ? ?Alyssa Lindsey is a 88 y.o. year old female who is a primary care patient of Myrlene Broker, MD. The CCM team was consulted to assist the patient with chronic disease management and/or care coordination needs related to: Level of Care Concerns.  ? ?Collaboration with patient's daughter  for initial visit in response to provider referral for social work chronic care management and care coordination services.  ? ?Consent to Services:  ?The patient was given information about Chronic Care Management services, agreed to services, and gave verbal consent prior to initiation of services.  Please see initial visit note for detailed documentation.  ? ?Patient's daughter agreed to services and consent obtained.  ? ?Assessment: Patient's daughters [name] and [name]  provided all information during this encounter. ?They are currently meeting patient's needs at home. Provided education and information for level of care options in the home and outside of the home. No Care Plan was developed during this encounter.  ?Recent life changes Efrain Sella: None ? ?Interventions:Solution-Focused Strategies employed:  ?Caregiver stress acknowledged   ?Review various resources, discussed options and provided patient information about  ?Personal Care Services [facility]) :(does not have Medicaid) ?Community Alternative Program  (CAP) ?Private pay options for personal care needs :(discussed) ?DSS in-home aide program:(reviewed and contact information provided) ?Facility placement process :reviewed process and information provided ? ?Recommendation: Patient may benefit from, and daughter is in agreement to get on the wait list for in-home care, CAPS program as well as explore out of the home options. ? ?Follow up Plan:  ?Patient's daughter does not require continued follow-up by CCM LCSW. Will contact the office if  needed ?Patient may benefit from and is in agreement for CCM LCSW to remain part of care team for the next 90 days.  If no needs are identified in the next 90 days, CCM LSCW will disconnect from the care team.  ? ?Assessment: Review of patient past medical history, allergies, medications, and health status, including review of relevant consultants reports was performed today as part of a comprehensive evaluation and provision of chronic care management and care coordination services.    ? ?SDOH (Social Determinants of Health) assessments and interventions performed:  none identified ? ?Task & Summary for AVS: ?Call to apply for  In Home Aide Services [phone] ?Call Community Alternative Program CAP [phone] to establish care.  Look for a packet in the mail from [place]. ?Review information for Private Pay options and out of home care option  ? ?Sammuel Hines, LCSW ?Licensed Clinical Social Worker [facility]  ?[facility]  ?[phone]  ? ? ? ? ? ?

## 2022-04-03 NOTE — Patient Instructions (Addendum)
Visit Information  ? ?Thank you for taking time to visit with me today. Please don't hesitate to contact me if I can be of assistance to you before our next scheduled telephone appointment. ? ?Following are the goals we discussed today: Education on community options ? ?Please call the care guide team at [phone] if you need to cancel or reschedule your appointment.  ? ?If you are experiencing a Mental Health or [place] or need someone to talk to, please call 1-800-273-TALK (toll free, 24 hour hotline)  ?Task & Summary for AVS: ?Call to apply for  In [place] [phone] ?Call Community Alternative Program CAP [phone] to establish care.  Look for a packet in the mail from [place]. ?Review information for Private Pay options and out of home care option  ?[place]  [phone]    ? ?[place] [phone]   ? ? [phone]   ? ?[place]   ? ? ?Consent to CCM Services: ?Ms. Strege was given information about Chronic Care Management services including:  ?CCM service includes personalized support from designated clinical staff supervised by her physician, including individualized plan of care and coordination with other care providers ?24/7 contact phone numbers for assistance for urgent and routine care needs. ?Service will only be billed when office clinical staff spend 20 minutes or more in a month to coordinate care. ?Only one practitioner may furnish and bill the service in a calendar month. ?The patient may stop CCM services at any time (effective at the end of the month) by phone call to the office staff. ?The patient will be responsible for cost sharing (co-pay) of up to 20% of the service fee (after annual deductible is met). ? ?Patient agreed to services and verbal consent obtained.  ? ?The patient verbalized understanding of instructions, educational materials, and care plan provided  today and agreed to receive a mailed copy of patient instructions, educational materials, and care plan.  ? ?No follow up scheduled, per our conversation you do not require continued follow up ?Per our conversation I will remain part of your care team for the next 90 days.  If no needs are identified in the next 90 days, I will disconnect from the care team. ? ?Casimer Lanius, LCSW ?Licensed Clinical Social Worker [facility]  ?[place]  ?[phone]  ? ? ?  ?

## 2022-04-03 NOTE — Progress Notes (Incomplete)
? ?Chronic Care Management ?Pharmacy Note ? ?04/03/2022 ?Name:  Alyssa Lindsey MRN:  [id number] DOB:  1929/02/18 ? ?Summary: ?*** ? ?Recommendations/Changes made from today's visit: ?*** ? ?Plan: ?*** ? ?Subjective: ?Alyssa Lindsey is an 88 y.o. year old female who is a primary patient of Hoyt Koch, MD.  The CCM team was consulted for assistance with disease management and care coordination needs.   ? ?Engaged with patient by telephone for initial visit in response to provider referral for pharmacy case management and/or care coordination services.  ? ?Consent to Services:  ?The patient was given the following information about Chronic Care Management services today, agreed to services, and gave verbal consent: 1. CCM service includes personalized support from designated clinical staff supervised by the primary care provider, including individualized plan of care and coordination with other care providers 2. 24/7 contact phone numbers for assistance for urgent and routine care needs. 3. Service will only be billed when office clinical staff spend 20 minutes or more in a month to coordinate care. 4. Only one practitioner may furnish and bill the service in a calendar month. 5.The patient may stop CCM services at any time (effective at the end of the month) by phone call to the office staff. 6. The patient will be responsible for cost sharing (co-pay) of up to 20% of the service fee (after annual deductible is met). Patient agreed to services and consent obtained. ? ?Patient Care Team: ?Hoyt Koch, MD as PCP - General (Internal Medicine) ?Minus Breeding, MD as PCP - Cardiology (Cardiology) ?Foltanski, Cleaster Corin, [facility] as Pharmacist (Pharmacist) ?Knox Royalty, RN as [place] Management ?Maurine Cane, LCSW as Education officer, museum (Licensed Holiday representative) ?Tomasa Blase, [facility] as Pharmacist (Pharmacist) ? ?Recent office visits: ?03/23/2022 - Dr. Sharlet Salina  - dark urin /  UTI, increase in tremor - macrobid rx'd - prednisone rx'd  ?01/16/2022 - Dr. Jenny Reichmann - VV - acute cough - doxy and hycodan rx'd  ? ?Recent consult visits: ?02/15/2022 - Geraldo Pitter NP - Pulmonology - DOE - potassium 17mEq BID x 3 days, follow up next Friday  ?02/01/2022 - Dr. Percival Spanish - Cardiology - no changes to medications - f/u in 12 months ?11/29/2021 - Dr. Melvyn Novas- Pulmonology  - no changes to medications  ? ?Hospital visits: ?None in previous 6 months ? ?Objective: ? ?Lab Results  ?Component Value Date  ? CREATININE 1.46 (H) 03/23/2022  ? BUN 12 03/23/2022  ? GFR 31.39 (L) 03/23/2022  ? GFRNONAA 37 (L) 03/14/2021  ? GFRAA 34 (L) 11/08/2020  ? NA 140 03/23/2022  ? K 3.1 (L) 03/23/2022  ? CALCIUM 8.9 03/23/2022  ? CO2 30 03/23/2022  ? GLUCOSE 136 (H) 03/23/2022  ? ? ?Lab Results  ?Component Value Date/Time  ? HGBA1C 6.3 09/27/2021 10:12 AM  ? HGBA1C 6.7 (H) 06/27/2020 01:46 PM  ? GFR 31.39 (L) 03/23/2022 10:18 AM  ? GFR 29.46 (L) 02/23/2022 10:19 AM  ? MICROALBUR 1.1 09/11/2019 08:55 AM  ? MICROALBUR 0.6 07/30/2014 11:26 AM  ?  ?Last diabetic Eye exam:  ?No results found for: HMDIABEYEEXA  ?Last diabetic Foot exam:  ?Lab Results  ?Component Value Date/Time  ? HMDIABFOOTEX Yes 06/10/2009 12:00 AM  ?  ? ?Lab Results  ?Component Value Date  ? CHOL 154 09/27/2021  ? HDL 43.00 09/27/2021  ? Gorman 88 09/27/2021  ? TRIG 113.0 09/27/2021  ? CHOLHDL 4 09/27/2021  ? ? ? ?  Latest Ref Rng &  Units 03/23/2022  ? 10:18 AM 09/27/2021  ? 10:12 AM 03/14/2021  ? 11:55 AM  ?Hepatic Function  ?Total Protein 6.0 - 8.3 g/dL 7.2   7.4   6.8    ?Albumin 3.5 - 5.2 g/dL 3.6   4.0   3.5    ?AST 0 - 37 U/L 19   14   24     ?ALT 0 - 35 U/L 8   6   13     ?Alk Phosphatase 39 - 117 U/L 64   75   62    ?Total Bilirubin 0.2 - 1.2 mg/dL 0.6   0.8   0.6    ? ? ?Lab Results  ?Component Value Date/Time  ? TSH 4.17 03/23/2022 10:18 AM  ? TSH 1.350 10/29/2019 03:23 PM  ? FREET4 0.95 03/04/2014 11:21 AM  ? ? ? ?  Latest Ref Rng & Units 03/23/2022  ? 10:18 AM  02/15/2022  ? 11:51 AM 09/27/2021  ? 10:12 AM  ?CBC  ?WBC 4.0 - 10.5 K/uL 7.4   5.6   9.5    ?Hemoglobin 12.0 - 15.0 g/dL 11.4   12.2   12.6    ?Hematocrit 36.0 - 46.0 % 35.5   38.4   39.0    ?Platelets 150.0 - 400.0 K/uL 143.0   152.0   154.0    ? ? ?Lab Results  ?Component Value Date/Time  ? VD25OH 30.61 03/23/2022 10:18 AM  ? VD25OH 18.01 (L) 08/12/2018 08:32 AM  ? ? ?Clinical ASCVD: No  ?The ASCVD Risk score (Arnett DK, et al., 2019) failed to calculate for the following reasons: ?  The 2019 ASCVD risk score is only valid for ages 16 to 34   ? ? ?  03/23/2022  ?  9:23 AM 08/14/2021  ?  1:17 PM 05/16/2021  ? 11:26 AM  ?Depression screen PHQ 2/9  ?Decreased Interest 0 0 0  ?Down, Depressed, Hopeless 0 0 0  ?PHQ - 2 Score 0 0 0  ?Altered sleeping 0    ?Tired, decreased energy 0    ?Change in appetite 0    ?Feeling bad or failure about yourself  0    ?Trouble concentrating 0    ?Moving slowly or fidgety/restless 0    ?Suicidal thoughts 0    ?PHQ-9 Score 0    ?  ?Social History  ? ?Tobacco Use  ?Smoking Status Never  ?Smokeless Tobacco Never  ? ?BP Readings from Last 3 Encounters:  ?03/23/22 122/74  ?02/15/22 118/74  ?02/01/22 123/71  ? ?Pulse Readings from Last 3 Encounters:  ?03/23/22 68  ?02/15/22 62  ?02/01/22 63  ? ?Wt Readings from Last 3 Encounters:  ?03/23/22 150 lb 3.2 oz (68.1 kg)  ?02/15/22 154 lb (69.9 kg)  ?02/01/22 160 lb (72.6 kg)  ? ?BMI Readings from Last 3 Encounters:  ?03/23/22 24.99 kg/m?  ?02/15/22 25.63 kg/m?  ?02/01/22 26.63 kg/m?  ? ? ?Assessment/Interventions: Review of patient past medical history, allergies, medications, health status, including review of consultants reports, laboratory and other test data, was performed as part of comprehensive evaluation and provision of chronic care management services.  ? ?SDOH:  (Social Determinants of Health) assessments and interventions performed: Yes ? ?SDOH Screenings  ? ?Alcohol Screen: Low Risk   ? Last Alcohol Screening Score (AUDIT): 0  ?Depression  (PHQ2-9): Low Risk   ? PHQ-2 Score: 0  ?Financial Resource Strain: Low Risk   ? Difficulty of Paying Living Expenses: Not very hard  ?Food Insecurity: No  Food Insecurity  ? Worried About Charity fundraiser in the Last Year: Never true  ? Ran Out of Food in the Last Year: Never true  ?Housing: Low Risk   ? Last Housing Risk Score: 0  ?Physical Activity: Inactive  ? Days of Exercise per Week: 0 days  ? Minutes of Exercise per Session: 0 min  ?Social Connections: Socially Isolated  ? Frequency of Communication with Friends and Family: More than three times a week  ? Frequency of Social Gatherings with Friends and Family: More than three times a week  ? Attends Religious Services: Never  ? Active Member of Clubs or Organizations: No  ? Attends Archivist Meetings: Never  ? Marital Status: Widowed  ?Stress: No Stress Concern Present  ? Feeling of Stress : Not at all  ?Tobacco Use: Low Risk   ? Smoking Tobacco Use: Never  ? Smokeless Tobacco Use: Never  ? Passive Exposure: Not on file  ?Transportation Needs: No Transportation Needs  ? Lack of Transportation (Medical): No  ? Lack of Transportation (Non-Medical): No  ? ? ?CCM Care Plan ? ?Allergies  ?Allergen Reactions  ? Dicyclomine Hcl Other (See Comments)  ?  Severe eye pain  ? Lisinopril Cough  ? Aspirin Other (See Comments)  ?  Unknown reaction  ? ? ?Medications Reviewed Today   ? ? Reviewed by Ilean China, RN (Registered Nurse) on 03/30/22 at [place] List Status: <None>  ? ?Medication Order Taking? Sig Documenting Provider Last Dose Status Informant  ?acetaminophen (TYLENOL) 650 MG CR tablet [id number] No Take 1,300 mg by mouth every 8 (eight) hours as needed for pain. [provider] Taking Active   ?albuterol (PROVENTIL) (2.5 MG/3ML) 0.083% nebulizer solution [id number] No Take 3 mLs (2.5 mg total) by nebulization every 6 (six) hours as needed for wheezing or shortness of breath. Hoyt Koch, MD Taking Active   ?albuterol (VENTOLIN  HFA) 108 (90 Base) MCG/ACT inhaler [id number] No Inhale 2 puffs into the lungs every 8 (eight) hours. Hoyt Koch, MD Taking Active   ?ALPRAZolam (XANAX) 0.25 MG tablet [id number] No Take 1 tabl

## 2022-04-03 NOTE — Chronic Care Management (AMB) (Signed)
? Care Management ?  ? RN Visit Note ? ?03/30/2022 ?Name: Alyssa Lindsey MRN: [id number] DOB: Aug 25, 1929 ? ?Subjective: ?Alyssa Lindsey is a 88 y.o. year old female who is a primary care patient of Alyssa Koch, MD. The care management team was consulted for assistance with disease management and care coordination needs.   ? ?Engaged with patient's daughters and caregivers by telephone  for initial visit in response to provider referral for case management and/or care coordination services.  ? ?Consent to Services:  ? [name] was given information about Care Management services today including:  ?Care Management services includes personalized support from designated clinical staff supervised by her physician, including individualized plan of care and coordination with other care providers ?24/7 contact phone numbers for assistance for urgent and routine care needs. ?The patient may stop case management services at any time by phone call to the office staff. ? ?Patient agreed to services and consent obtained.  ? ?Assessment: Review of patient past medical history, allergies, medications, health status, including review of consultants reports, laboratory and other test data, was performed as part of comprehensive evaluation and provision of chronic care management services.  ? ?SDOH (Social Determinants of Health) assessments and interventions performed:  ?SDOH Interventions   ? ?Flowsheet Row Most Recent Value  ?SDOH Interventions   ?Financial Strain Interventions Intervention Not Indicated  ?Housing Interventions Intervention Not Indicated  ?Transportation Interventions Intervention Not Indicated  ? ?  ?  ? ?Care Plan ? ?Allergies  ?Allergen Reactions  ? Dicyclomine Hcl Other (See Comments)  ?  Severe eye pain  ? Lisinopril Cough  ? Aspirin Other (See Comments)  ?  Unknown reaction  ? ? ?Outpatient Encounter Medications as of 03/30/2022  ?Medication Sig  ? acetaminophen (TYLENOL) 650 MG CR tablet Take  1,300 mg by mouth every 8 (eight) hours as needed for pain.  ? albuterol (PROVENTIL) (2.5 MG/3ML) 0.083% nebulizer solution Take 3 mLs (2.5 mg total) by nebulization every 6 (six) hours as needed for wheezing or shortness of breath.  ? albuterol (VENTOLIN HFA) 108 (90 Base) MCG/ACT inhaler Inhale 2 puffs into the lungs every 8 (eight) hours.  ? ALPRAZolam (XANAX) 0.25 MG tablet Take 1 tablet by mouth twice daily as needed for anxiety  ? amLODipine (NORVASC) 5 MG tablet Take 1 tablet (5 mg total) by mouth daily.  ? Capsaicin 0.075 % STCK .075% topically twice per day  ? cetirizine (ZYRTEC) 10 MG tablet TAKE ONE TABLET BY MOUTH ONCE DAILY  ? donepezil (ARICEPT) 5 MG tablet TAKE 1 TABLET BY MOUTH AT BEDTIME  ? famotidine (PEPCID) 20 MG tablet Take 1 tablet (20 mg total) by mouth 2 (two) times daily.  ? furosemide (LASIX) 20 MG tablet TAKE 1 TABLET BY MOUTH AS NEEDED FOR EDEMA  ? hydrochlorothiazide (MICROZIDE) 12.5 MG capsule Take 1 capsule (12.5 mg total) by mouth daily.  ? lidocaine (LIDODERM) 5 % Place 2 patches onto the skin daily. Remove & Discard patch within 12 hours or as directed by MD (Patient taking differently: Place 2 patches onto the skin daily. Remove & Discard patch within 12 hours or as directed by MD prn)  ? losartan (COZAAR) 50 MG tablet Take 1 tablet by mouth once daily  ? megestrol (MEGACE) 20 MG tablet Take 1 tablet (20 mg total) by mouth daily.  ? mirtazapine (REMERON) 30 MG tablet Take 1 tablet (30 mg total) by mouth at bedtime.  ? potassium chloride SA (KLOR-CON M) 20 MEQ  tablet Take 1 tablet (20 mEq total) by mouth 2 (two) times daily.  ? predniSONE (DELTASONE) 20 MG tablet Take 2 tablets (40 mg total) by mouth daily with breakfast.  ? Rivaroxaban (XARELTO) 15 MG TABS tablet Take 1 tablet (15 mg total) by mouth daily with supper.  ? [EXPIRED] sulfamethoxazole-trimethoprim (BACTRIM DS) 800-160 MG tablet Take 1 tablet by mouth 2 (two) times daily for 4 days.  ? VITAMIN D, CHOLECALCIFEROL, PO  Take 1 tablet by mouth daily. gummy  ? [DISCONTINUED] haloperidol (HALDOL) 0.5 MG tablet TAKE 1 TABLET BY MOUTH EVERY 8 HOURS AS NEEDED FOR  AGITATION  ? ?No facility-administered encounter medications on file as of 03/30/2022.  ? ? ?Patient Active Problem List  ? Diagnosis Date Noted  ? Acute gout 03/23/2022  ? Dark urine 03/23/2022  ? Confusion 03/23/2022  ? Cough 01/16/2022  ? Pacemaker 03/29/2021  ? Late onset Alzheimer's dementia with behavioral disturbance ([place]) 03/17/2021  ? Urinary frequency 01/12/2021  ? Generalized weakness 01/12/2021  ? Dehydration 01/12/2021  ? Symptomatic bradycardia 12/15/2020  ? Myalgia 10/25/2020  ? Stage 3a chronic kidney disease ([place]) 09/18/2020  ? Leg swelling 09/18/2020  ? Sleep disturbance 09/06/2020  ? Chronic atrial fibrillation ([place]) 04/20/2020  ? Thrombocytopenia ([place]) 04/18/2020  ? Blood in sputum 04/18/2020  ? Dyspnea on exertion 04/12/2020  ? Permanent atrial fibrillation ([place]) 04/12/2020  ? PHN (postherpetic neuralgia) 12/02/2019  ? Breast pain, left 10/06/2019  ? Constipation 11/18/2017  ? Adjustment disorder with mixed anxiety and depressed mood 07/06/2016  ? Low back pain 05/09/2016  ? Numbness and tingling of both legs 04/25/2016  ? Bilateral leg edema 04/11/2016  ? Encounter for general adult medical examination with abnormal findings 12/26/2015  ? GERD (gastroesophageal reflux disease) 03/02/2015  ? Right knee pain 03/04/2014  ? Chronic LLQ pain 02/07/2012  ? B12 deficiency 03/06/2010  ? Weight loss, unintentional 01/29/2009  ? 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 THYROID NODULE 03/03/2008  ? BREAST MASS 03/03/2008  ? Diabetes type 2, controlled ([place]) 06/21/2007  ? Essential hypertension 06/21/2007  ? ? ?Conditions to be addressed/monitored: HTN, DMII, CKD Stage 3, and postherpetic neuralgia and advanced age ? ?Care Plan : RNCM Care Plan  ?  ? ?Problem: Chronic Disease Management Needs   ?Priority: High  ?Onset Date: 03/30/2022  ?  ? ?Long-Range Goal:  Patient/Daughter's will Work with Consulting civil engineer Regarding Care Management and [place] with HTN, CKD 3, Afib, DM, Postherpetic Neuralgia, and Advanced Age   ?Start Date: 03/30/2022  ?This Visit's Progress: On track  ?Priority: High  ?Note:   ?Current Barriers:  ?Chronic Disease Management support and education needs related to HTN, DMII, CKD Stage 3, and Advanced Age ? ?RNCM Clinical Goal(s):  ?Patient will continue to work with RN Care Manager and/or Social Worker to address care management and care coordination needs related to HTN, DMII, CKD Stage 3, and Advanced Age as evidenced by adherence to CM Team Scheduled appointments     through collaboration with LCSW, provider, and care team.  ? ?Interventions: ?1:1 collaboration with primary care provider regarding development and update of comprehensive plan of care as evidenced by provider attestation and co-signature ?Inter-disciplinary care team collaboration (see longitudinal plan of care) ?Evaluation of current treatment plan related to  self management and patient's adherence to plan as established by provider ?Talked with patient's daughters [name] and [place]. Discussed family/social support. Primary support is provided by daughters.  ?Discussed mobility and ability to perform  ADLs. Patient transfers independently but doesn't walk long distances.  ?SDOH assessment performed ?Reviewed and discussed medications. Xarelto is $90. Interested in prescription assistance if available. RNCM to collaborate with PharmD regarding prescription assistance.  ?Reviewed upcoming appointments with CCM LCSW and PharmD on 04/03/22 ? ? ?Falls:  (Status: New goal.) Long Term Goal  ?Provided written and verbal education re: potential causes of falls and Fall prevention strategies ?Advised patient of importance of notifying provider of falls ?Assessed for falls since last encounter ?Assessed social determinant of health barriers ?Discussed assistive devices and  recommended shower chair with bench seat, handheld shower head, and grab bars for shower. Patient has a walker but typically refuses to use it.   ? ? ?Postherpetic Neuralgia:  (Status: New goal.) Long Term Goal  ?Ev

## 2022-04-03 NOTE — Patient Instructions (Signed)
Visit Information  ? ?Thank you for taking time to visit with me today. Please don't hesitate to contact me if I can be of assistance to you before our next scheduled telephone appointment. ? ?Following are the goals we discussed today:  ?Patient Goals/Self-Care Activities: ?Take medications as prescribed   ?Attend all scheduled provider appointments ?Call provider office for new concerns or questions  ?Call RN Care Manager as needed ?Use walker for ambulation ?Move carefully and change positions slowly to prevent falls ?Consider purchasing a shower chair with a bench seat, handheld shower head, and grab bars for the shower ?Follow-up with PCP with any new or worsening symptoms ? ? ?Please call the care guide team at [phone] if you need to cancel or reschedule your appointment.  ? ?If you are experiencing a Mental Health or [place] or need someone to talk to, please call the [place] National Suicide Prevention Lifeline: (916)376-5732 or TTY: 442-567-7268 TTY (562) 391-9161) to talk to a trained counselor ?go to [facility] [address] [place] [id number])  ? ?Following is a copy of your full care plan:  ?Care Plan : [place]  ?Updates made by Ilean China, RN since 04/03/2022 12:00 AM  ?  ? ?Problem: Chronic Disease Management Needs   ?Priority: High  ?Onset Date: 03/30/2022  ?  ? ?Long-Range Goal: Patient/Daughter's will Work with Consulting civil engineer Regarding Care Management and [place] with HTN, CKD 3, Afib, DM, Postherpetic Neuralgia, and Advanced Age   ?Start Date: 03/30/2022  ?This Visit's Progress: On track  ?Priority: High  ?Note:   ?Current Barriers:  ?Chronic Disease Management support and education needs related to HTN, DMII, CKD Stage 3, and Advanced Age ? ?RNCM Clinical Goal(s):  ?Patient will continue to work with RN Care Manager and/or Social Worker to address care management and care coordination needs related to HTN,  DMII, CKD Stage 3, and Advanced Age as evidenced by adherence to CM Team Scheduled appointments     through collaboration with LCSW, provider, and care team.  ? ?Interventions: ?1:1 collaboration with primary care provider regarding development and update of comprehensive plan of care as evidenced by provider attestation and co-signature ?Inter-disciplinary care team collaboration (see longitudinal plan of care) ?Evaluation of current treatment plan related to  self management and patient's adherence to plan as established by provider ?Talked with patient's daughters [name] and [place]. Discussed family/social support. Primary support is provided by daughters.  ?Discussed mobility and ability to perform ADLs. Patient transfers independently but doesn't walk long distances.  ?SDOH assessment performed ?Reviewed and discussed medications. Xarelto is $90. Interested in prescription assistance if available. RNCM to collaborate with PharmD regarding prescription assistance.  ?Reviewed upcoming appointments with CCM LCSW and PharmD on 04/03/22 ? ? ?Falls:  (Status: New goal.) Long Term Goal  ?Provided written and verbal education re: potential causes of falls and Fall prevention strategies ?Advised patient of importance of notifying provider of falls ?Assessed for falls since last encounter ?Assessed social determinant of health barriers ?Discussed assistive devices and recommended shower chair with bench seat, handheld shower head, and grab bars for shower. Patient has a walker but typically refuses to use it.   ? ? ?Postherpetic Neuralgia:  (Status: New goal.) Long Term Goal  ?Evaluation of current treatment plan related to postherpetic neuralgia ?Assessed chronic pain in left breast and shoulder blade s/p shingles outbreak ?Discussed use of OTC capsaicin cream. May provide some relief per daughter.  ?Discussed  prior treatment with gabapentin. Per daughter, this was not helpful and was discontinued.  ?Encouraged to  follow-up with PCP with any new or worsening symptoms ? ? ?Patient Goals/Self-Care Activities: ?Take medications as prescribed   ?Attend all scheduled provider appointments ?Call provider office for new concerns or questions  ?Call RN Care Manager as needed ?Use walker for ambulation ?Move carefully and change positions slowly to prevent falls ?Consider purchasing a shower chair with a bench seat, handheld shower head, and grab bars for the shower ?Follow-up with PCP with any new or worsening symptoms ? ? ?  ? ? ?Patient agreed to services and verbal consent obtained.  ? ?Patient verbalizes understanding of instructions and care plan provided today and agrees to view in [place]. Active MyChart status confirmed with patient.   ? ? ?Plan: Telephone follow up appointment with care management team member scheduled for:  04/03/22 with LCSW and PharmD ?The patient has been provided with contact information for the care management team and has been advised to call with any health related questions or concerns.  ? ?Chong Sicilian, BSN, RN-BC ?Embedded Chronic Care Manager ?[facility] Care Management ?Direct Dial: [phone] ? ? ?  ?

## 2022-04-05 NOTE — Progress Notes (Signed)
Remote pacemaker transmission.   

## 2022-04-17 ENCOUNTER — Other Ambulatory Visit: Payer: Self-pay | Admitting: Internal Medicine

## 2022-04-24 ENCOUNTER — Ambulatory Visit: Payer: Medicare HMO | Admitting: *Deleted

## 2022-04-24 DIAGNOSIS — R531 Weakness: Secondary | ICD-10-CM

## 2022-04-24 DIAGNOSIS — I10 Essential (primary) hypertension: Secondary | ICD-10-CM

## 2022-04-24 DIAGNOSIS — B0229 Other postherpetic nervous system involvement: Secondary | ICD-10-CM

## 2022-04-24 NOTE — Patient Instructions (Addendum)
Visit Information  [name] and Barnett Applebaum, thank you for taking time to talk with me today about Alyssa Lindsey's health care needs. Please don't hesitate to contact me if I can be of assistance to you before our next scheduled telephone appointment  As we discussed today, I have made dr. Sharlet Salina aware of your request for a bedside commode/ ("3-in-1"), as well as your request for home health services for PT and OT: please listen out for follow up around these requests I have placed; if you have questions about the status of these requests, please call Dr. Nathanial Millman office staff to follow up, at [phone]  Below are the goals we discussed today:  Patient Self-Care Activities: Patient Alyssa Lindsey will: Take medications as prescribed Attend all scheduled provider appointments Call pharmacy for medication refills Call provider office for new concerns or questions Try to follow heart healthy, low salt, low cholesterol, carbohydrate-modified, low sugar diet Take precautions to prevent falls: use your walker if needed Call Alyssa Lindsey's insurance provider to determine the details of whether or not her benefits include coverage for a hospital bed  Our next scheduled telephone follow up visit/ appointment is scheduled on: Thursday, May 17, 2022 at 10:30 am- This is a PHONE [place] appointment  If you need to cancel or re-schedule our visit, please call [phone] and our care guide team will be happy to assist you.   I look forward to hearing about your progress.   Oneta Rack, RN, BSN, [place] [phone]: direct office  If you are experiencing a Mental Health or [place] or need someone to talk to, please  call the Suicide and Crisis Lifeline: 988 call the [place] National Suicide Prevention Lifeline: 940-208-8903 or TTY: 743-585-6709 TTY (773)657-2866) to talk to a trained counselor call 1-800-273-TALK (toll free, 24 hour  hotline) go to [facility] [place] ([phone]) call 911   Caregiver/ daughters verbalize understanding of instructions and care plan provided today and agrees to view in [place]. Active MyChart status and patient understanding of how to access instructions and care plan via MyChart confirmed with patient's caregivers    Telephone follow up appointment with care management team member scheduled for: Thursday, May 17, 2022 at 10:30 am The patient has been provided with contact information for the care management team and has been advised to call with any health related questions or concerns   Understanding Your Risk for Falls Each year, millions of people have serious injuries from falls. It is important to understand your risk for falling. Talk with your health care provider about your risk and what you can do to lower it. There are actions you can take at home to lower your risk and prevent falls. If you do have a serious fall, make sure to tell your health care provider. Falling once raises your risk of falling again. How can falls affect me? Serious injuries from falls are common. These include: Broken bones, such as hip fractures. Head injuries, such as traumatic brain injuries (TBI) or concussion. A fear of falling can cause you to avoid activities and stay at home. This can make your muscles weaker and actually raise your risk for a fall. What can increase my risk? There are a number of risk factors that increase your risk for falling. The more risk factors you have, the higher your risk of falling. Serious injuries from a fall happen most often to people older  than age 52. Children and young adults ages 31-29 are also at higher risk. Common risk factors include: Weakness in the lower body. Lack (deficiency) of vitamin D. Being generally weak or confused due to long-term (chronic) illness. Dizziness or balance problems. Poor  vision. Medicines that cause dizziness or drowsiness. These can include medicines for your blood pressure, heart, anxiety, insomnia, or edema, as well as pain medicines and muscle relaxants. Other risk factors include: Drinking alcohol. Having had a fall in the past. Having depression. Having foot pain or wearing improper footwear. Working at a dangerous job. Having any of the following in your home: Tripping hazards, such as floor clutter or loose rugs. Poor lighting. Pets. Having dementia or memory loss. What actions can I take to lower my risk of falling?     Physical activity Maintain physical fitness. Do strength and balance exercises. Consider taking a regular class to build strength and balance. Yoga and tai chi are good options. Vision Have your eyes checked every year and your vision prescription updated as needed. Walking aids and footwear Wear nonskid shoes. Do not wear high heels. Do not walk around the house in socks or slippers. Use a cane or walker as told by your health care provider. Home safety Attach secure railings on both sides of your stairs. Install grab bars for your tub, shower, and toilet. Use a bath mat in your tub or shower. Use good lighting in all rooms. Keep a flashlight near your bed. Make sure there is a clear path from your bed to the bathroom. Use night-lights. Do not use throw rugs. Make sure all carpeting is taped or tacked down securely. Remove all clutter from walkways and stairways, including extension cords. Repair uneven or broken steps. Avoid walking on icy or slippery surfaces. Walk on the grass instead of on icy or slick sidewalks. Use ice melt to get rid of ice on walkways. Use a cordless phone. Questions to ask your health care provider Can you help me check my risk for a fall? Do any of my medicines make me more likely to fall? Should I take a vitamin D supplement? What exercises can I do to improve my strength and  balance? Should I make an appointment to have my vision checked? Do I need a bone density test to check for weak bones or osteoporosis? Would it help to use a cane or a walker? Where to find more information Centers for Disease Control and Prevention, [employer]: http://www.wolf.info/ Community-Based Fall Prevention Programs: http://www.wolf.info/ National Institute on Aging: http://kim-miller.com/ Contact a health care provider if: You fall at home. You are afraid of falling at home. You feel weak, drowsy, or dizzy. Summary Serious injuries from a fall happen most often to people older than age 45. Children and young adults ages 75-29 are also at higher risk. Talk with your health care provider about your risks for falling and how to lower those risks. Taking certain precautions at home can lower your risk for falling. If you fall, always tell your health care provider. This information is not intended to replace advice given to you by your health care provider. Make sure you discuss any questions you have with your health care provider. Document Revised: 06/15/2020 Document Reviewed: 06/15/2020 Elsevier Patient Education  [employer].

## 2022-04-24 NOTE — Chronic Care Management (AMB) (Signed)
Care Management    RN Visit Note  04/24/2022 Name: Alyssa Lindsey MRN: [id number] DOB: Oct 07, 1929  Subjective: Alyssa Lindsey is a 88 y.o. year old female who is a primary care patient of Alyssa Koch, MD. The care management team was consulted for assistance with disease management and care coordination needs.    Engaged with patient's daughters/ caregivers [name] and [place], on [facility] DPR by telephone for follow up visit in response to provider referral for case management and/or care coordination services.   Consent to Services:   Ms. Meeuwsen was given information about Care Management services 03/28/21 including:  Care Management services includes personalized support from designated clinical staff supervised by her physician, including individualized plan of care and coordination with other care providers 24/7 contact phone numbers for assistance for urgent and routine care needs. The patient may stop case management services at any time by phone call to the office staff.  Patient agreed to services and consent obtained.   Assessment: Review of patient past medical history, allergies, medications, health status, including review of consultants reports, laboratory and other test data, was performed as part of comprehensive evaluation and provision of chronic care management services.   SDOH (Social Determinants of Health) assessments and interventions performed:  SDOH Interventions    Flowsheet Row Most Recent Value  SDOH Interventions   Food Insecurity Interventions Intervention Not Indicated  [caregiver/ daughter denies food insecurity]  Housing Interventions Intervention Not Indicated  [patient lives with caregivers/ daughters in single family home, "since 1961, " deny housing concerns: have 2 steps of front entry that patient is able to navigate with caregiver supervision/ assistance as needed]  Transportation Interventions Intervention Not Indicated  [caregiver/ daughters  provide transportation]     Care Plan  Allergies  Allergen Reactions   Dicyclomine Hcl Other (See Comments)    Severe eye pain   Lisinopril Cough   Aspirin Other (See Comments)    Unknown reaction    Outpatient Encounter Medications as of 04/24/2022  Medication Sig   haloperidol (HALDOL) 0.5 MG tablet TAKE 1 TABLET BY MOUTH EVERY 8 HOURS AS NEEDED FOR AGITATION   acetaminophen (TYLENOL) 650 MG CR tablet Take 1,300 mg by mouth every 8 (eight) hours as needed for pain.   albuterol (PROVENTIL) (2.5 MG/3ML) 0.083% nebulizer solution Take 3 mLs (2.5 mg total) by nebulization every 6 (six) hours as needed for wheezing or shortness of breath.   albuterol (VENTOLIN HFA) 108 (90 Base) MCG/ACT inhaler Inhale 2 puffs into the lungs every 8 (eight) hours.   ALPRAZolam (XANAX) 0.25 MG tablet Take 1 tablet by mouth twice daily as needed for anxiety   amLODipine (NORVASC) 5 MG tablet Take 1 tablet (5 mg total) by mouth daily.   Capsaicin 0.075 % STCK .075% topically twice per day   cetirizine (ZYRTEC) 10 MG tablet TAKE ONE TABLET BY MOUTH ONCE DAILY   donepezil (ARICEPT) 5 MG tablet TAKE 1 TABLET BY MOUTH AT BEDTIME   famotidine (PEPCID) 20 MG tablet Take 1 tablet (20 mg total) by mouth 2 (two) times daily.   furosemide (LASIX) 20 MG tablet TAKE 1 TABLET BY MOUTH AS NEEDED FOR EDEMA   hydrochlorothiazide (MICROZIDE) 12.5 MG capsule Take 1 capsule (12.5 mg total) by mouth daily.   lidocaine (LIDODERM) 5 % Place 2 patches onto the skin daily. Remove & Discard patch within 12 hours or as directed by MD (Patient taking differently: Place 2 patches onto the skin daily. Remove & Discard  patch within 12 hours or as directed by MD prn)   losartan (COZAAR) 50 MG tablet Take 1 tablet by mouth once daily   megestrol (MEGACE) 20 MG tablet Take 1 tablet (20 mg total) by mouth daily.   mirtazapine (REMERON) 30 MG tablet Take 1 tablet (30 mg total) by mouth at bedtime.   potassium chloride SA (KLOR-CON M) 20 MEQ  tablet Take 1 tablet (20 mEq total) by mouth 2 (two) times daily.   predniSONE (DELTASONE) 20 MG tablet Take 2 tablets (40 mg total) by mouth daily with breakfast.   Rivaroxaban (XARELTO) 15 MG TABS tablet Take 1 tablet (15 mg total) by mouth daily with supper.   VITAMIN D, CHOLECALCIFEROL, PO Take 1 tablet by mouth daily. gummy   No facility-administered encounter medications on file as of 04/24/2022.   Patient Active Problem List   Diagnosis Date Noted   Acute gout 03/23/2022   Dark urine 03/23/2022   Confusion 03/23/2022   Cough 01/16/2022   Pacemaker 03/29/2021   Late onset Alzheimer's dementia with behavioral disturbance ([place]) 03/17/2021   Urinary frequency 01/12/2021   Generalized weakness 01/12/2021   Dehydration 01/12/2021   Symptomatic bradycardia 12/15/2020   Myalgia 10/25/2020   Stage 3a chronic kidney disease ([place]) 09/18/2020   Leg swelling 09/18/2020   Sleep disturbance 09/06/2020   Chronic atrial fibrillation ([place]) 04/20/2020   Thrombocytopenia ([place]) 04/18/2020   Blood in sputum 04/18/2020   Dyspnea on exertion 04/12/2020   Permanent atrial fibrillation (HCC) 04/12/2020   PHN (postherpetic neuralgia) 12/02/2019   Breast pain, left 10/06/2019   Constipation 11/18/2017   Adjustment disorder with mixed anxiety and depressed mood 07/06/2016   Low back pain 05/09/2016   Numbness and tingling of both legs 04/25/2016   Bilateral leg edema 04/11/2016   Encounter for general adult medical examination with abnormal findings 12/26/2015   GERD (gastroesophageal reflux disease) 03/02/2015   Right knee pain 03/04/2014   Chronic LLQ pain 02/07/2012   B12 deficiency 03/06/2010   Weight loss, unintentional 01/29/2009   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   THYROID NODULE 03/03/2008   BREAST MASS 03/03/2008   Diabetes type 2, controlled ([place]) 06/21/2007   Essential hypertension 06/21/2007   Conditions to be addressed/monitored: HTN, falls, and neuralgia  Care  Plan : [facility] Care Plan  Updates made by Knox Royalty, RN since 04/24/2022 12:00 AM     Problem: Chronic Disease Management Needs   Priority: High  Onset Date: 03/30/2022     Long-Range Goal: Ongoing adherence to established plan of care for long term chronic disease management   Start Date: 03/30/2022  Expected End Date: 03/31/2023  Recent Progress: On track  Priority: High  Note:   Current Barriers:  Chronic Disease Management support and education needs related to HTN, DMII, CKD Stage 3, and Advanced Age Requires assistance from caregivers at home for ADL's and iADL's: dressing, bathing, tolieting, preparing meals, medication administration, assistance/ supervision with mobility 04/24/22: caregivers/ daughters requests home health services for PT/ OT, due to patient's progressive weakness- will make PCP aware of their request for her consideration Fall risk: reports new/ recent fall without injury "about 2 weeks ago" (approximately "middle of May") DME needs: caregivers report need for bedside commode/ "3-in-one" primarily for raised toilet function- will make PCP aware of their request and need for order if PCP is agreeable 04/24/22: caregivers/ daughters reports "possible" need for hospital bed; reports they have trouble situating patient in her current bed: they are asking  me how their insurance will cover this: explained that they should contact their insurance plan to obtain specific details around coverage/ possible out-of pocket costs; they have not yet contacted insurance company: we will re-visit this request at [place] next outreach after they have had time to contact insurance company about coverage details  RNCM Clinical Goal(s):  Patient will continue to work with Consulting civil engineer and/or Education officer, museum to address care management and care coordination needs related to HTN, DMII, CKD Stage 3, and Advanced Age as evidenced by adherence to CM Team Scheduled appointments     through  collaboration with LCSW, provider, and care team.   Interventions: 1:1 collaboration with primary care provider regarding development and update of comprehensive plan of care as evidenced by provider attestation and co-signature Inter-disciplinary care team collaboration (see longitudinal plan of care) Evaluation of current treatment plan related to  self management and patient's adherence to plan as established by provider Review of patient status, including review of consultants reports, relevant laboratory and other test results, and medications completed SDOH updated: no new/ unmet concerns identified Pain assessment completed: caregivers deny that patient is in pain today; reports occasional bouts of ongoing intermittent post-herpectic neuralgia; state today that "this is not a big problem" Falls assessment completed: reports one fall without injury, "about 2 weeks ago," where patient went to use bathroom and daughters "found her on the floor;" daughters were able to assist in getting patient up off floor; they confirms that patient does not currently require assistive devices-- "we just watch her closely and help her if she seems to need it;" they confirm they do have a walker, should patient need this in the future; education and encouragement provided to continue efforts at fall prevention; will send education around fall risks/ prevention through MyChart Confirmed that caregivers/ daughters manage all aspects of medication administration: they deny questions/ concerns around medications today Reviewed with caregivers PCP office visit 03/23/22- they confirm that patient "is better;" states she took antibiotic as prescribed; they reports "she seems to have more swelling than normal in her legs;" they have been elevating her legs at home and giving her (prn ordered) Lasix, "every day;" have not contacted PCP about this newly reported concern: I advised that if this continues, it would be a good idea  to schedule PCP office visit for Dr. Sharlet Salina to address- they will watch/ consider doing so; otherwise, patient's daughters/ caregivers verbalize no clinical concerns and reports "breathing seems fine" Reviewed upcoming scheduled provider appointments: no scheduled appointments noted Discussed plans with patient for ongoing care management follow up and provided patient with direct contact information for care management team     Falls/ HTN:  (Status: 04/24/22: Goal on Track (progressing): YES.) Long Term Goal  Provided written and verbal education re: potential causes of falls and Fall prevention strategies Advised patient of importance of notifying provider of falls Assessed for falls since last encounter Advised patient to discuss their desire for home health services for PT/OT and for DME: bedside commode/ 3-in-one with provider-- if they do not hear back from the requests I have made with PCP today Evaluation of current treatment plan related to hypertension self management and patient's adherence to plan as established by provider Discussed complications of poorly controlled blood pressure such as heart disease, stroke, circulatory complications, vision complications, kidney impairment, sexual dysfunction Confirmed caregivers do not currently monitor or record blood pressures at home Confirmed caregivers prepare all meals/ medications for patient: reports "not a great  appetite;" deny issues with swallowing food/ medications  Last practice recorded BP readings:  BP Readings from Last 3 Encounters:  03/23/22 122/74  02/15/22 118/74  02/01/22 123/71  Postherpetic Neuralgia:  (Status: 04/17/22: Goal on Track (progressing): YES.) Long Term Goal  Evaluation of current treatment plan related to postherpetic neuralgia Assessed chronic pain in left breast and shoulder blade s/p shingles outbreak- caregivers deny that patient is in pain today Encouraged to follow-up with PCP with any new or  worsening symptoms  Patient Goals/Self-Care Activities: As evidenced by review of EHR, collaboration with care team, and patient reporting during CCM RN CM outreach,  Patient Eldene will: Take medications as prescribed Attend all scheduled provider appointments Call pharmacy for medication refills Call provider office for new concerns or questions Try to follow heart healthy, low salt, low cholesterol, carbohydrate-modified, low sugar diet Take precautions to prevent falls: use your walker if needed Call Abena's insurance provider to determine the details of whether or not her benefits include coverage for a hospital bed     Plan:  Telephone follow up appointment with care management team member scheduled for:  Thursday, May 17, 2022 at 10:30 am The patient has been provided with contact information for the care management team and has been advised to call with any health related questions or concerns   Oneta Rack, RN, BSN, [place] [phone]: direct office

## 2022-04-30 ENCOUNTER — Other Ambulatory Visit: Payer: Medicare HMO | Admitting: *Deleted

## 2022-04-30 DIAGNOSIS — Z515 Encounter for palliative care: Secondary | ICD-10-CM

## 2022-04-30 NOTE — Progress Notes (Signed)
[facility] COMMUNITY PALLIATIVE CARE RN NOTE  PATIENT NAME: KANDEE ESCALANTE DOB: 12/01/1928 MRN: [id number]  PRIMARY CARE PROVIDER: Myrlene Broker, MD  RESPONSIBLE PARTY: Riesa Pope Acct ID - Guarantor Home Phone Work Phone Relationship Acct Type  [id number] - Scheunemann,DORO* [phone]  Self P/F     [address], [place], [place] [address]   Due to the COVID-19 crisis, this virtual check-in visit was done via telephone from my office and it was initiated and consent by this patient and or family.  RN telephonic encounter completed with patient's daughter [name]. She reports that patient is experiencing some decline. Patient's feet are starting to swell and she will not get out of her recliner. She is also sleeping more overall and [name] is concerned. She would like for her mom to have an in person visit. RN visit scheduled for 05/01/22@10a . Spoke with Dierdre Harness NP to provide patient update and will conference her in on visit tomorrow via video. Daughter is agreeable to this plan. Palliative care will continue to follow.  (Duration of visit and documentation 10 minutes)   Candiss Norse, RN BSN

## 2022-05-01 ENCOUNTER — Other Ambulatory Visit: Payer: Medicare HMO | Admitting: *Deleted

## 2022-05-01 ENCOUNTER — Other Ambulatory Visit: Payer: Medicare HMO | Admitting: Hospice

## 2022-05-01 VITALS — BP 121/78 | HR 60 | Temp 97.9°F | Resp 18

## 2022-05-01 DIAGNOSIS — R600 Localized edema: Secondary | ICD-10-CM

## 2022-05-01 DIAGNOSIS — Z515 Encounter for palliative care: Secondary | ICD-10-CM

## 2022-05-01 DIAGNOSIS — F02818 Dementia in other diseases classified elsewhere, unspecified severity, with other behavioral disturbance: Secondary | ICD-10-CM

## 2022-05-01 DIAGNOSIS — G301 Alzheimer's disease with late onset: Secondary | ICD-10-CM | POA: Diagnosis not present

## 2022-05-01 DIAGNOSIS — F03911 Unspecified dementia, unspecified severity, with agitation: Secondary | ICD-10-CM

## 2022-05-01 DIAGNOSIS — R69 Illness, unspecified: Secondary | ICD-10-CM | POA: Diagnosis not present

## 2022-05-01 NOTE — Progress Notes (Signed)
[facility] COMMUNITY PALLIATIVE CARE RN NOTE  PATIENT NAME: Alyssa Lindsey DOB: 19-Apr-1929 MRN: [id number]  PRIMARY CARE PROVIDER: Myrlene Broker, MD  RESPONSIBLE PARTY: Riesa Pope (daughter) Acct ID - Guarantor Home Phone Work Phone Relationship Acct Type  [id number] Alyssa Lindsey, Alyssa Lindsey* [phone]  Self P/F     [address] [place], [place] [address]   RN face to face visit completed with patient and her daughter's [name] and [name]. Conferenced in [employer] NP via video. See NP note for complete visit details.   (Duration of visit and documentation 45 minutes)   Candiss Norse, RN BSN

## 2022-05-01 NOTE — Progress Notes (Signed)
Therapist, nutritional Palliative Care Consult Note Telephone: [phone]  Fax: [phone]  PATIENT NAME: Alyssa Lindsey DOB: 02/11/1929 MRN: [id number]  PRIMARY CARE PROVIDER:   Myrlene Broker, MD Myrlene Broker, MD [address] [place],  [place] [address]  REFERRING PROVIDER: Myrlene Broker, MD Myrlene Broker, MD [address] [place],  [place] [address]  RESPONSIBLE PARTY:   Self [facility]: [name] and Reine Just to call [name] [phone] Contact Information     Name Relation Home Work Mobile   [place] Daughter [phone]     Rinehart,[name] Daughter [phone]     Kryslyn, Helbig Daughter   [phone]     TELEHEALTH VISIT STATEMENT Due to the COVID-19 crisis, this visit was done via telemedicine from my office and it was initiated and consent by this patient and or family.  I connected with patient OR PROXY by a telephone/video  and verified that I am speaking with the correct person. I discussed the limitations of evaluation and management by telemedicine. Patient/proxy expressed understanding and agreed to proceed.  Monishia RN is in person with patient seeing patient/ facilitating video call.  [name] and Tommie Raymond are present with patient during visit. Palliative Care was asked to follow this patient to address advance care planning, complex medical decision making and goals of care clarification.   This is a follow up visit.  RECOMMENDATIONS/PLAN:   Advance Care Planning/Code Status: Patient is currently a Full code. Family will discuss if and when to change it.   Goals of Care: Goals of care include to maximize quality of life and symptom management.  Palliative care team will continue to support patient, patient's family, and medical team.  Symptom management/Plan:  Edema to BLE: Worsening, related to chronic congestive heart failure.  1+ pitting edema to left lower ankle, right ankle is nonpitting.   No respiratory distress, no cough, no malaise.  Patient is currently on HTCZ and Lasix.  Recommendation: Increase Lasix from 20 mg to 40 mg daily x 2 days.  Take potassium chloride 20 mEq daily when taking Lasix. Education reiterated to elevate BLE during the day as much as possible to promote circulation.  Adhere to fluid and salt limits.  Monitor weight closely and report gait weight gain of 2 pounds in a day or 5 pounds in a week.   Follow-up with PCP.  Routine CBC BMP.  Dementia: memory loss and confusion are ongoing in line with Dementia disease trajectory  FAST 6C. Safety and Fall precautions in place.  Continue Aricept and ongoing supportive care. Encourage reminiscence,  continue word puzzle/search/Bingo  Agitation: Managed with Haldol. Deescalation techniques; redirection.   Pain: left shoulder.  Continue lidocaine patch as ordered, OTC Tylenol 500mg  po TID prn pain.  HTN: Managed with Amlodipine, Losartan. Anxiety: Managed with Lorazepam Follow up: Palliative care will continue to follow for complex medical decision making, advance care planning, and clarification of goals. Return 6 weeks or prn. Encouraged to call provider sooner with any concerns.   CHIEF COMPLAINT: Palliative follow up   HISTORY OF PRESENT ILLNESS: Alyssa Lindsey a 88 y.o. female with multiple medical problems including multiple comorbidities requiring close monitoring: Congestive heart failure, dementia, hypertension, A. fib, agitation, anxiety, weight loss.  History obtained from review of EMR, discussion with primary team, family and/or patient. Records reviewed and summarized above.  Independent interpretation of tests, reviewed as needed, available labs, patient records, imaging, studies and related documents from  the EMR. All 10 point systems reviewed and are negative except as documented in history of present illness above  Review and summarization of Epic records shows history from other than patient.    Palliative Care was asked to follow this patient o help address complex decision making in the context of advance care planning and goals of care clarification.  PERTINENT MEDICATIONS:  Outpatient Encounter Medications as of 05/01/2022  Medication Sig   haloperidol (HALDOL) 0.5 MG tablet TAKE 1 TABLET BY MOUTH EVERY 8 HOURS AS NEEDED FOR AGITATION   acetaminophen (TYLENOL) 650 MG CR tablet Take 1,300 mg by mouth every 8 (eight) hours as needed for pain.   albuterol (PROVENTIL) (2.5 MG/3ML) 0.083% nebulizer solution Take 3 mLs (2.5 mg total) by nebulization every 6 (six) hours as needed for wheezing or shortness of breath.   albuterol (VENTOLIN HFA) 108 (90 Base) MCG/ACT inhaler Inhale 2 puffs into the lungs every 8 (eight) hours.   ALPRAZolam (XANAX) 0.25 MG tablet Take 1 tablet by mouth twice daily as needed for anxiety   amLODipine (NORVASC) 5 MG tablet Take 1 tablet (5 mg total) by mouth daily.   Capsaicin 0.075 % STCK .075% topically twice per day   cetirizine (ZYRTEC) 10 MG tablet TAKE ONE TABLET BY MOUTH ONCE DAILY   donepezil (ARICEPT) 5 MG tablet TAKE 1 TABLET BY MOUTH AT BEDTIME   famotidine (PEPCID) 20 MG tablet Take 1 tablet (20 mg total) by mouth 2 (two) times daily.   furosemide (LASIX) 20 MG tablet TAKE 1 TABLET BY MOUTH AS NEEDED FOR EDEMA   hydrochlorothiazide (MICROZIDE) 12.5 MG capsule Take 1 capsule (12.5 mg total) by mouth daily.   lidocaine (LIDODERM) 5 % Place 2 patches onto the skin daily. Remove & Discard patch within 12 hours or as directed by MD (Patient taking differently: Place 2 patches onto the skin daily. Remove & Discard patch within 12 hours or as directed by MD prn)   losartan (COZAAR) 50 MG tablet Take 1 tablet by mouth once daily   megestrol (MEGACE) 20 MG tablet Take 1 tablet (20 mg total) by mouth daily.   mirtazapine (REMERON) 30 MG tablet Take 1 tablet (30 mg total) by mouth at bedtime.   potassium chloride SA (KLOR-CON M) 20 MEQ tablet Take 1 tablet (20  mEq total) by mouth 2 (two) times daily.   predniSONE (DELTASONE) 20 MG tablet Take 2 tablets (40 mg total) by mouth daily with breakfast.   Rivaroxaban (XARELTO) 15 MG TABS tablet Take 1 tablet (15 mg total) by mouth daily with supper.   VITAMIN D, CHOLECALCIFEROL, PO Take 1 tablet by mouth daily. gummy   No facility-administered encounter medications on file as of 05/01/2022.    HOSPICE ELIGIBILITY/DIAGNOSIS: TBD  PAST MEDICAL HISTORY:  Past Medical History:  Diagnosis Date   Arthritis    Atrial fibrillation (HCC)    Chronic neck pain    Diverticulosis of colon (without mention of hemorrhage)    Family history of malignant neoplasm of gastrointestinal tract    Gastritis    GERD (gastroesophageal reflux disease)    Hiatal hernia    HTN (hypertension)    LBP (low back pain)    Memory problem    Spondylosis    Thrombocytopenia (HCC)    Type II or unspecified type diabetes mellitus without mention of complication, not stated as uncontrolled       ALLERGIES:  Allergies  Allergen Reactions   Dicyclomine Hcl Other (See Comments)  Severe eye pain   Lisinopril Cough   Aspirin Other (See Comments)    Unknown reaction      Thank you for the opportunity to participate in the care of DONIKA BUTNER Please call our office at [phone] if we can be of additional assistance.  Note: Portions of this note were generated with Scientist, clinical (histocompatibility and immunogenetics). Dictation errors may occur despite best attempts at proofreading.  Rosaura Carpenter, NP

## 2022-05-02 ENCOUNTER — Encounter: Payer: Self-pay | Admitting: Internal Medicine

## 2022-05-04 ENCOUNTER — Other Ambulatory Visit: Payer: Self-pay | Admitting: Internal Medicine

## 2022-05-04 DIAGNOSIS — R0609 Other forms of dyspnea: Secondary | ICD-10-CM

## 2022-05-05 IMAGING — DX DG CHEST 2V
2 series · 2 of 2 positions shown · non-contrast
Comparison: Two days ago

CLINICAL DATA: Pacemaker placement

EXAM:
CHEST - 2 VIEW

[chest lat]
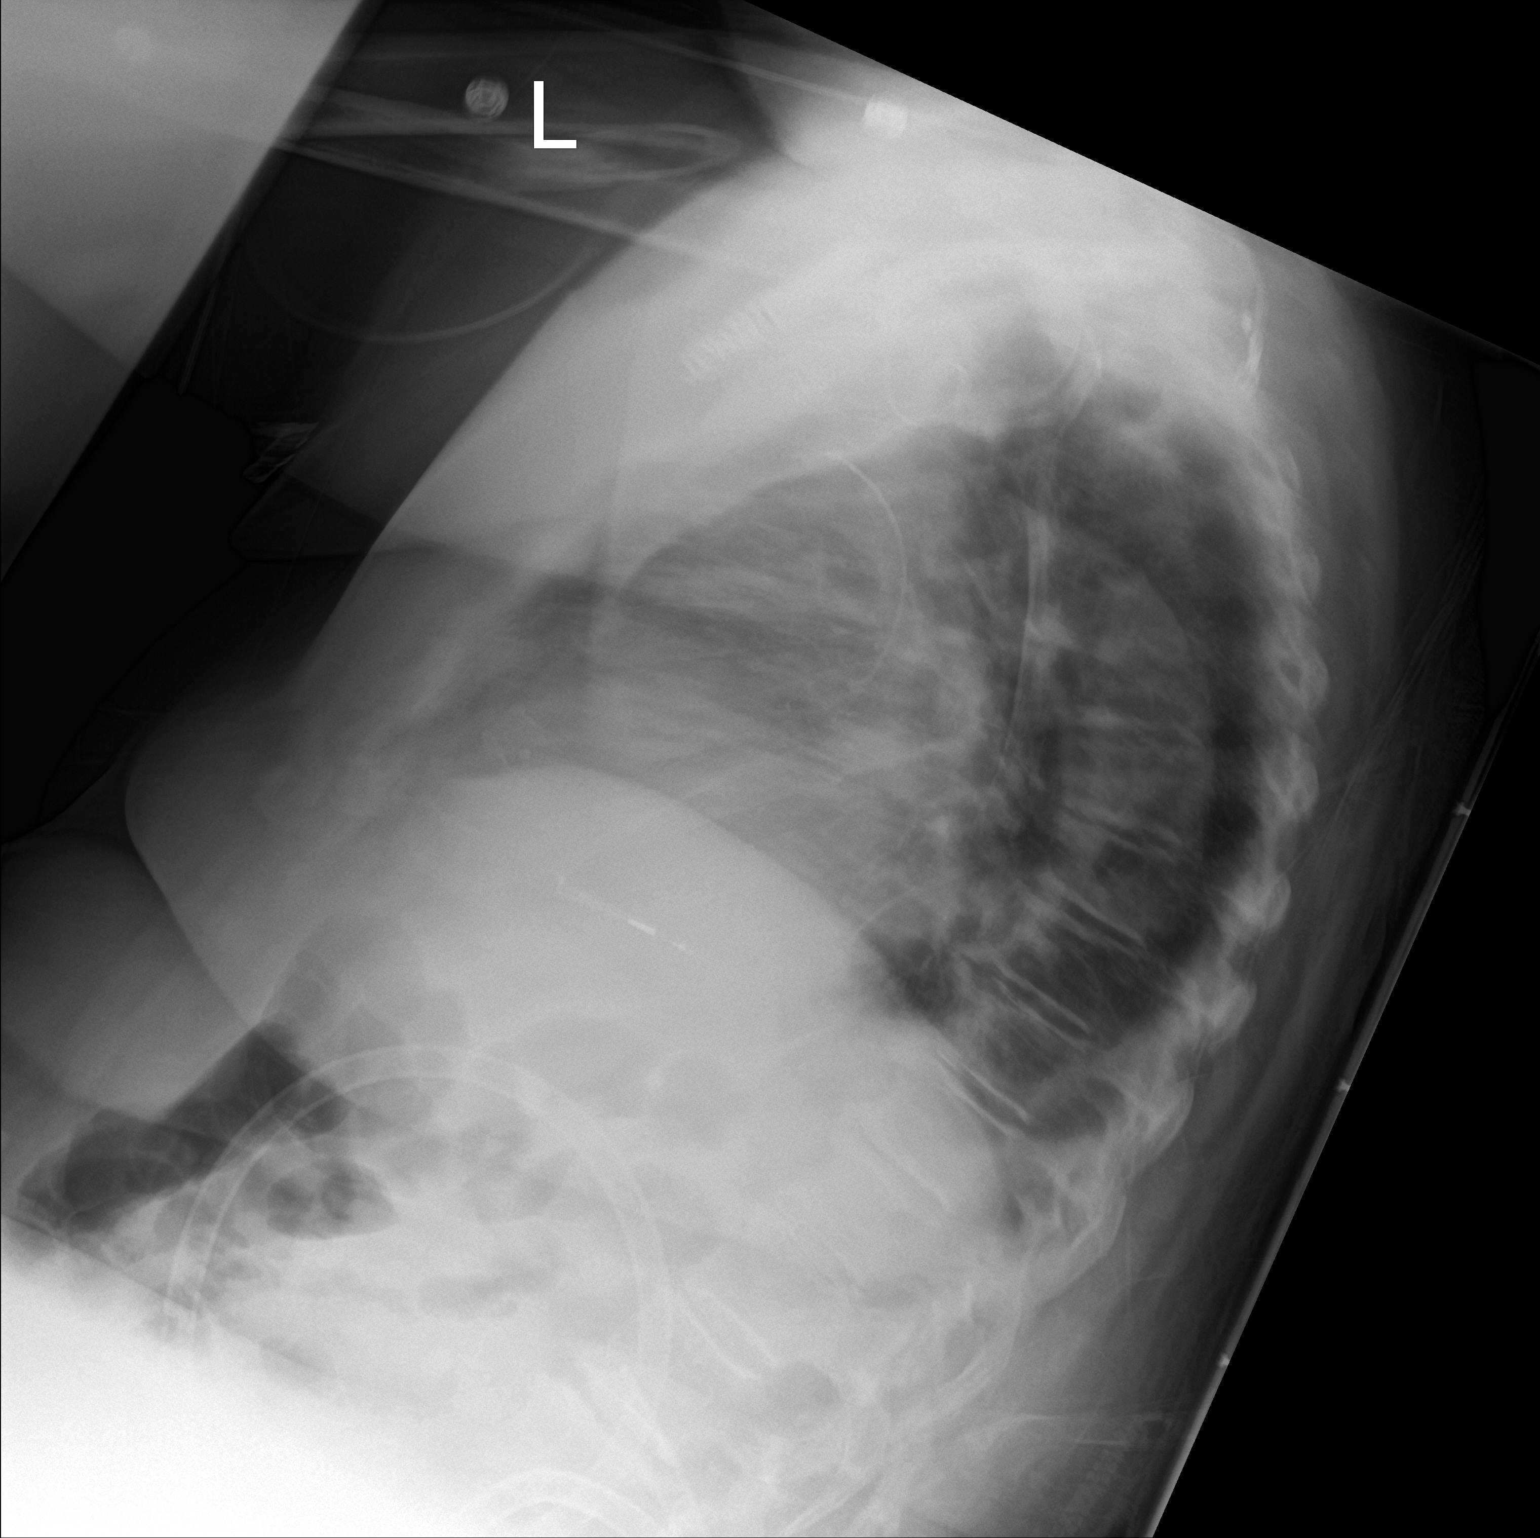

[chest ap]
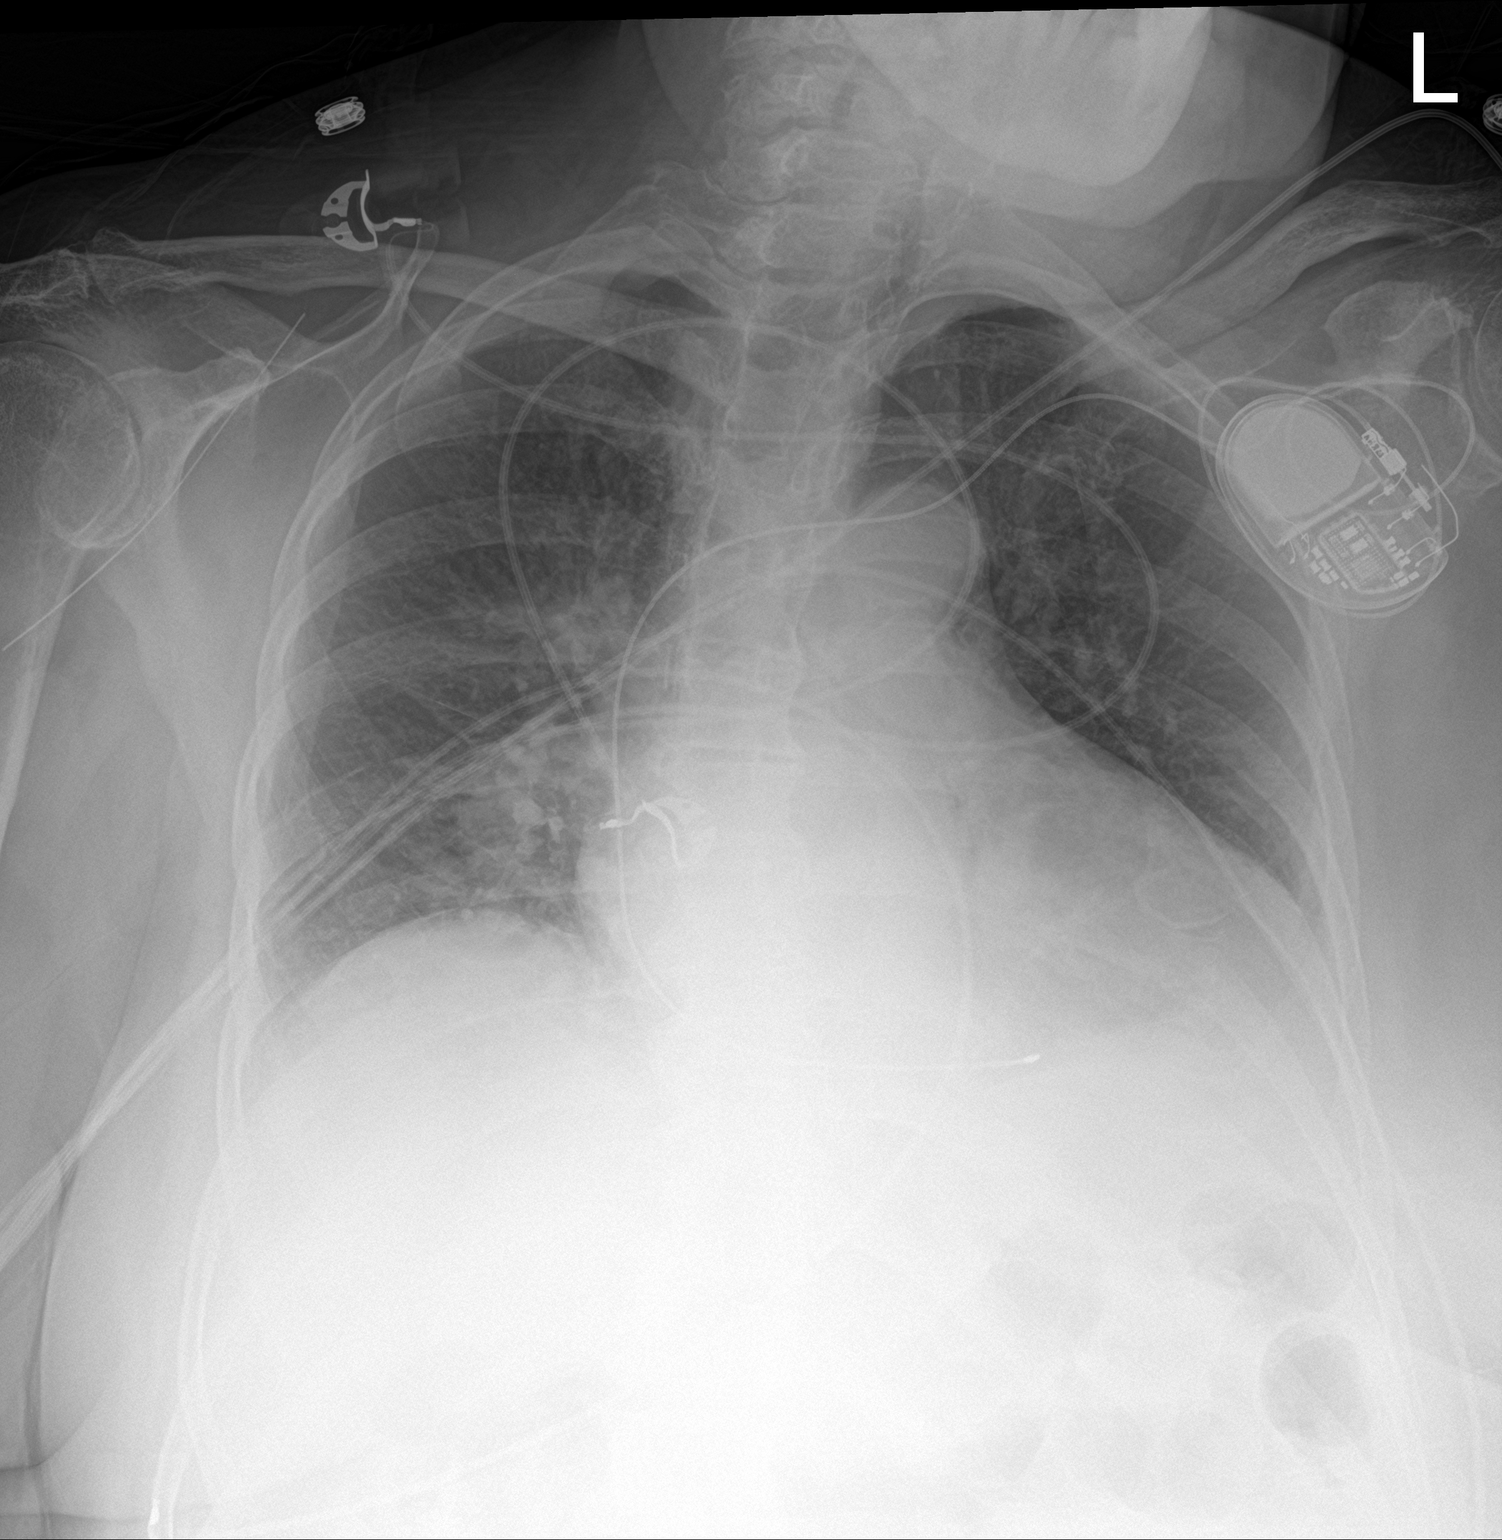

[2 of 2 positions shown; findings below may reference images not displayed]

FINDINGS: Single chamber pacer lead into the right ventricle. No new
mediastinal widening when accounting for rotation. Chronic
cardiomegaly. No convincing pneumothorax. Negative for failure.

Motion degraded lateral view.
IMPRESSION: No acute finding after pacer implant.

## 2022-05-16 ENCOUNTER — Other Ambulatory Visit: Payer: Self-pay | Admitting: Internal Medicine

## 2022-05-16 NOTE — Telephone Encounter (Signed)
Check  registry last filled 04/19/2022.[name]/LMB

## 2022-05-17 ENCOUNTER — Telehealth: Payer: Medicare HMO

## 2022-05-18 ENCOUNTER — Telehealth: Payer: Medicare HMO

## 2022-05-24 ENCOUNTER — Other Ambulatory Visit: Payer: Self-pay | Admitting: Cardiology

## 2022-05-25 ENCOUNTER — Ambulatory Visit: Payer: Medicare HMO | Admitting: *Deleted

## 2022-05-25 DIAGNOSIS — I10 Essential (primary) hypertension: Secondary | ICD-10-CM

## 2022-05-25 DIAGNOSIS — R531 Weakness: Secondary | ICD-10-CM

## 2022-05-25 NOTE — Patient Instructions (Signed)
Visit Information  [name] and [name]: thank you for taking time to talk with me today about Alyssa Lindsey's health care needs; I have enjoyed working with you both as you care for your mother  As we discussed today, I have asked the office staff for Dr. Okey Dupre to follow up on the bedside commode that Dr. Okey Dupre ordered on May 04, 2022- please listen out for additional information about this, and follow up with the office staff if you do not hear from the medical equipment company soon  Below are the goals we discussed today:  Patient Self-Care Activities: Patient Smriti will: Take medications as prescribed Attend all scheduled provider appointments Call pharmacy for medication refills Call provider office for new concerns or questions Try to follow heart healthy, low salt, low cholesterol, carbohydrate-modified, low sugar diet Take precautions to prevent falls: use your walker if needed Please make an appointment with Dr. Okey Dupre to discuss medications and any needs Makeda has for equipment or home health services   Caryl Pina, RN, BSN, CCRN [facility] RN Care Coordination- [facility] [phone]: direct office  If you are experiencing a Mental Health or Behavioral Health Crisis or need someone to talk to, please  call the Suicide and Crisis Lifeline: 988 call the [place] National Suicide Prevention Lifeline: 973 549 8267 or TTY: 936-809-4046 TTY (601) 428-5515) to talk to a trained counselor call 1-800-273-TALK (toll free, 24 hour hotline) go to [facility] [address], [place] [phone]) call 911   Caregivers verbalize understanding of instructions and care plan provided today and agrees to view in MyChart. Active MyChart status and patient understanding of how to access instructions and care plan via MyChart confirmed with caregivers     Peripheral Edema  Peripheral edema is swelling that is caused by a  buildup of fluid. Peripheral edema most often affects the lower legs, ankles, and feet. It can also develop in the arms, hands, and face. The area of the body that has peripheral edema will look swollen. It may also feel heavy or warm. Your clothes may start to feel tight. Pressing on the area may make a temporary dent in your skin (pitting edema). You may not be able to move your swollen arm or leg as much as usual. There are many causes of peripheral edema. It can happen because of a complication of other conditions such as heart failure, kidney disease, or a problem with your circulation. It also can be a side effect of certain medicines or happen because of an infection. It often happens to women during pregnancy. Sometimes, the cause is not known. Follow these instructions at home: Managing pain, stiffness, and swelling  Raise (elevate) your legs while you are sitting or lying down. Move around often to prevent stiffness and to reduce swelling. Do not sit or stand for long periods of time. Do not wear tight clothing. Do not wear garters on your upper legs. Exercise your legs to get your circulation going. This helps to move the fluid back into your blood vessels, and it may help the swelling go down. Wear compression stockings as told by your health care provider. These stockings help to prevent blood clots and reduce swelling in your legs. It is important that these are the correct size. These stockings should be prescribed by your doctor to prevent possible injuries. If elastic bandages or wraps are recommended, use them as told by your health care provider. Medicines Take over-the-counter and prescription medicines only  as told by your health care provider. Your health care provider may prescribe medicine to help your body get rid of excess water (diuretic). Take this medicine if you are told to take it. General instructions Eat a low-salt (low-sodium) diet as told by your health care  provider. Sometimes, eating less salt may reduce swelling. Pay attention to any changes in your symptoms. Moisturize your skin daily to help prevent skin from cracking and draining. Keep all follow-up visits. This is important. Contact a health care provider if: You have a fever. You have swelling in only one leg. You have increased swelling, redness, or pain in one or both of your legs. You have drainage or sores at the area where you have edema. Get help right away if: You have edema that starts suddenly or is getting worse, especially if you are pregnant or have a medical condition. You develop shortness of breath, especially when you are lying down. You have pain in your chest or abdomen. You feel weak. You feel like you will faint. These symptoms may be an emergency. Get help right away. Call 911. Do not wait to see if the symptoms will go away. Do not drive yourself to the hospital. Summary Peripheral edema is swelling that is caused by a buildup of fluid. Peripheral edema most often affects the lower legs, ankles, and feet. Move around often to prevent stiffness and to reduce swelling. Do not sit or stand for long periods of time. Pay attention to any changes in your symptoms. Contact a health care provider if you have edema that starts suddenly or is getting worse, especially if you are pregnant or have a medical condition. Get help right away if you develop shortness of breath, especially when lying down. This information is not intended to replace advice given to you by your health care provider. Make sure you discuss any questions you have with your health care provider. Document Revised: 07/17/2021 Document Reviewed: 07/17/2021 Elsevier Patient Education  2023 [employer].

## 2022-05-25 NOTE — Chronic Care Management (AMB) (Signed)
Care Management    RN Visit Note  05/25/2022 Name: Alyssa Lindsey MRN: [id number] DOB: March 08, 1929  Subjective: Alyssa Lindsey is a 88 y.o. year old female who is a primary care patient of Hoyt Koch, MD. The care management team was consulted for assistance with disease management and care coordination needs.    Engaged with patient's daughter's/ caregivers [name] and regina, on [facility] DPR by telephone for follow up visit/ clinic RN CM case closure in response to provider referral for case management and/or care coordination services.   Consent to Services:   [name] was given information about Care Management services 03/28/22 including:  Care Management services includes personalized support from designated clinical staff supervised by her physician, including individualized plan of care and coordination with other care providers 24/7 contact phone numbers for assistance for urgent and routine care needs. The patient may stop case management services at any time by phone call to the office staff.  Patient agreed to services and consent obtained.   Assessment: Review of patient past medical history, allergies, medications, health status, including review of consultants reports, laboratory and other test data, was performed as part of comprehensive evaluation and provision of chronic care management services.  Care Plan  Allergies  Allergen Reactions   Dicyclomine Hcl Other (See Comments)    Severe eye pain   Lisinopril Cough   Aspirin Other (See Comments)    Unknown reaction   Outpatient Encounter Medications as of 05/25/2022  Medication Sig   haloperidol (HALDOL) 0.5 MG tablet TAKE 1 TABLET BY MOUTH EVERY 8 HOURS AS NEEDED FOR AGITATION   acetaminophen (TYLENOL) 650 MG CR tablet Take 1,300 mg by mouth every 8 (eight) hours as needed for pain.   albuterol (PROVENTIL) (2.5 MG/3ML) 0.083% nebulizer solution Take 3 mLs (2.5 mg total) by nebulization every 6 (six) hours  as needed for wheezing or shortness of breath.   albuterol (VENTOLIN HFA) 108 (90 Base) MCG/ACT inhaler Inhale 2 puffs into the lungs every 8 (eight) hours.   ALPRAZolam (XANAX) 0.25 MG tablet Take 1 tablet by mouth twice daily as needed for anxiety   amLODipine (NORVASC) 5 MG tablet Take 1 tablet by mouth once daily   Capsaicin 0.075 % STCK .075% topically twice per day   cetirizine (ZYRTEC) 10 MG tablet TAKE ONE TABLET BY MOUTH ONCE DAILY   donepezil (ARICEPT) 5 MG tablet TAKE 1 TABLET BY MOUTH AT BEDTIME   famotidine (PEPCID) 20 MG tablet Take 1 tablet (20 mg total) by mouth 2 (two) times daily.   furosemide (LASIX) 20 MG tablet TAKE 1 TABLET BY MOUTH AS NEEDED FOR EDEMA   hydrochlorothiazide (MICROZIDE) 12.5 MG capsule Take 1 capsule (12.5 mg total) by mouth daily.   lidocaine (LIDODERM) 5 % Place 2 patches onto the skin daily. Remove & Discard patch within 12 hours or as directed by MD (Patient taking differently: Place 2 patches onto the skin daily. Remove & Discard patch within 12 hours or as directed by MD prn)   losartan (COZAAR) 50 MG tablet Take 1 tablet by mouth once daily   megestrol (MEGACE) 20 MG tablet Take 1 tablet (20 mg total) by mouth daily.   mirtazapine (REMERON) 30 MG tablet Take 1 tablet (30 mg total) by mouth at bedtime.   potassium chloride SA (KLOR-CON M) 20 MEQ tablet Take 1 tablet (20 mEq total) by mouth 2 (two) times daily.   predniSONE (DELTASONE) 20 MG tablet Take 2 tablets (40 mg  total) by mouth daily with breakfast.   Rivaroxaban (XARELTO) 15 MG TABS tablet Take 1 tablet (15 mg total) by mouth daily with supper.   VITAMIN D, CHOLECALCIFEROL, PO Take 1 tablet by mouth daily. gummy   No facility-administered encounter medications on file as of 05/25/2022.   Patient Active Problem List   Diagnosis Date Noted   Acute gout 03/23/2022   Dark urine 03/23/2022   Confusion 03/23/2022   Cough 01/16/2022   Pacemaker 03/29/2021   Late onset Alzheimer's dementia with  behavioral disturbance ([place]) 03/17/2021   Urinary frequency 01/12/2021   Generalized weakness 01/12/2021   Dehydration 01/12/2021   Symptomatic bradycardia 12/15/2020   Myalgia 10/25/2020   Stage 3a chronic kidney disease ([place]) 09/18/2020   Leg swelling 09/18/2020   Sleep disturbance 09/06/2020   Chronic atrial fibrillation ([place]) 04/20/2020   Thrombocytopenia ([place]) 04/18/2020   Blood in sputum 04/18/2020   Dyspnea on exertion 04/12/2020   Permanent atrial fibrillation (HCC) 04/12/2020   PHN (postherpetic neuralgia) 12/02/2019   Breast pain, left 10/06/2019   Constipation 11/18/2017   Adjustment disorder with mixed anxiety and depressed mood 07/06/2016   Low back pain 05/09/2016   Numbness and tingling of both legs 04/25/2016   Bilateral leg edema 04/11/2016   Encounter for general adult medical examination with abnormal findings 12/26/2015   GERD (gastroesophageal reflux disease) 03/02/2015   Right knee pain 03/04/2014   Chronic LLQ pain 02/07/2012   B12 deficiency 03/06/2010   Weight loss, unintentional 01/29/2009   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   THYROID NODULE 03/03/2008   BREAST MASS 03/03/2008   Diabetes type 2, controlled ([place]) 06/21/2007   Essential hypertension 06/21/2007   Conditions to be addressed/monitored: HTN and falls  Care Plan : [facility] Care Plan  Updates made by Knox Royalty, RN since 05/25/2022 12:00 AM     Problem: Chronic Disease Management Needs   Priority: High  Onset Date: 03/30/2022     Long-Range Goal: Ongoing adherence to established plan of care for long term chronic disease management   Start Date: 03/30/2022  Expected End Date: 03/31/2023  Recent Progress: On track  Priority: High  Note:   Current Barriers:  Chronic Disease Management support and education needs related to HTN, DMII, CKD Stage 3, and Advanced Age Requires assistance from caregivers at home for ADL's and iADL's: dressing, bathing, tolieting, preparing meals,  medication administration, assistance/ supervision with mobility 04/24/22: caregivers/ daughters requests home health services for PT/ OT, due to patient's progressive weakness- will make PCP aware of their request for her consideration 05/25/22: caregivers/ daughters report understanding to make office visit with PCP to discuss need for home health services and state they will do Fall risk: reports new/ recent fall without injury "about 2 weeks ago" (approximately "middle of May") 05/25/22: caregivers deny new falls since our last outreach 04/24/22 DME needs: caregivers report need for bedside commode/ "3-in-one" primarily for raised toilet function- will make PCP aware of their request and need for order if PCP is agreeable 04/24/22: caregivers/ daughters reports "possible" need for hospital bed; reports they have trouble situating patient in her current bed: they are asking me how their insurance will cover this: explained that they should contact their insurance plan to obtain specific details around coverage/ possible out-of pocket costs; they have not yet contacted insurance company: we will re-visit this request at [place] next outreach after they have had time to contact insurance company about coverage details 05/25/22: caregivers report they have not  received any follow up around DME for [facility] 3/1, ordered by PCP on 05/04/22- will make CMA for Dr. Sharlet Salina aware of same 05/25/22: caregivers confirm they have contacted patient's insurance company about out of pocket costs for hospital bed; report they will be responsible for 20% of cost; they are considering whether they wish to pursue this-- explained this equipment will require PCP order and encouraged them to discuss with PCP if they wish to pursue when they schedule next appointment- they verbalize understanding and agreement  RNCM Clinical Goal(s):  Patient will continue to work with [place] and/or Social Worker to address care management and  care coordination needs related to HTN, DMII, CKD Stage 3, and Advanced Age as evidenced by adherence to CM Team Scheduled appointments     through collaboration with LCSW, provider, and care team.   Interventions: 1:1 collaboration with primary care provider regarding development and update of comprehensive plan of care as evidenced by provider attestation and co-signature Inter-disciplinary care team collaboration (see longitudinal plan of care) Evaluation of current treatment plan related to  self management and patient's adherence to plan as established by provider Review of patient status, including review of consultants reports, relevant laboratory and other test results, and medications completed SDOH updated: no new/ unmet concerns identified Pain assessment updated: caregivers deny that patient is in pain today Falls assessment updated: caregivers deny new falls since our last outreach 04/24/22; continues not using assistive devices;  positive reinforcement provided for no new/ recent falls with encouragement to continue efforts at fall prevention; previously provided education around fall risks/ prevention reinforced Medications discussed: reports continues to independently self-manage and denies current concerns/ issues/ questions around medications; endorses adherence to taking all medications as prescribed Caregivers report they were advised by palliative care team to begin giving patient lasix 40 mg QD, which they have been doing for several weeks now- report LE swelling "better;" encouraged them to schedule PCP appointment to discuss ongoing/ long term dosing of lasix; parameters in which to give, given Lasix was previously ordered on prn status; provided education around need for ongoing lab work evaluation in setting of diuretic therapy; need to monitor for dehydration- they will make PCP appointment  Reviewed upcoming scheduled provider appointments: 05/31/22- palliative care video visit;  patient confirms is aware of all and has plans to attend as scheduled Discussed plans with patient for ongoing care management follow up- caregivers deny current care coordination/ care management needs and is agreeable to Clinic RN CM case closure today; caregivers verbalize understanding to contact PCP or other care providers for any needs that arise in the future, and confirms they have contact information for all care providers     Falls/ HTN:  (Status: 05/25/22: Goal Met.) Long Term Goal  Advised patient of importance of notifying provider of falls Assessed for falls since last encounter Advised patient to discuss their desire for home health services for PT/OT and for DME: bedside commode/ 3-in-one with provider-- if they do not hear back from the requests I have made with PCP today to follow up on [facility] which was ordered 05/04/22; they verbalize understanding that PCP needs office visit in order to place home health services Evaluation of current treatment plan related to hypertension self management and patient's adherence to plan as established by provider Discussed complications of poorly controlled blood pressure such as heart disease, stroke, circulatory complications, vision complications, kidney impairment, sexual dysfunction Confirmed caregivers do not currently monitor or record blood pressures at home;  deny signs/ symptoms low blood pressures Confirmed caregivers prepare all meals/ medications for patient: continue to report "not a great appetite;" deny issues with swallowing food/ medications, states "she just doesn't want to eat much"  Last practice recorded BP readings:  BP Readings from Last 3 Encounters:  05/01/22 121/78  03/23/22 122/74  02/15/22 118/74  Postherpetic Neuralgia:  (Status: 05/25/22: Goal Met.) Long Term Goal  Evaluation of current treatment plan related to postherpetic neuralgia Assessed chronic pain in left breast and shoulder blade s/p shingles outbreak-  caregivers again deny that patient is in pain today Encouraged to follow-up with PCP with any new or worsening symptoms  Patient Goals/Self-Care Activities: As evidenced by review of EHR, collaboration with care team, and patient reporting during CCM RN CM outreach,  Patient Rakesha will: Take medications as prescribed Attend all scheduled provider appointments Call pharmacy for medication refills Call provider office for new concerns or questions Try to follow heart healthy, low salt, low cholesterol, carbohydrate-modified, low sugar diet Take precautions to prevent falls: use your walker if needed Please make an appointment with Dr. Sharlet Salina to discuss medications and any needs Emika has for equipment or home health services    Plan:  No further follow up required: caregivers deny current care coordination/ care management needs and is agreeable to Clinic RN CM case closure today; verbalizes understanding to contact PCP or other care providers for any needs that arise in the future, and confirms they have contact information for all care providers     Oneta Rack, RN, BSN, [place] [phone]: direct office

## 2022-05-31 ENCOUNTER — Telehealth: Payer: Medicare HMO | Admitting: Hospice

## 2022-05-31 ENCOUNTER — Other Ambulatory Visit: Payer: Self-pay | Admitting: Internal Medicine

## 2022-05-31 DIAGNOSIS — Z515 Encounter for palliative care: Secondary | ICD-10-CM

## 2022-05-31 DIAGNOSIS — R600 Localized edema: Secondary | ICD-10-CM | POA: Diagnosis not present

## 2022-05-31 DIAGNOSIS — F02818 Dementia in other diseases classified elsewhere, unspecified severity, with other behavioral disturbance: Secondary | ICD-10-CM

## 2022-05-31 DIAGNOSIS — F03911 Unspecified dementia, unspecified severity, with agitation: Secondary | ICD-10-CM

## 2022-05-31 DIAGNOSIS — F419 Anxiety disorder, unspecified: Secondary | ICD-10-CM

## 2022-05-31 DIAGNOSIS — R451 Restlessness and agitation: Secondary | ICD-10-CM

## 2022-05-31 DIAGNOSIS — G301 Alzheimer's disease with late onset: Secondary | ICD-10-CM | POA: Diagnosis not present

## 2022-05-31 DIAGNOSIS — R69 Illness, unspecified: Secondary | ICD-10-CM | POA: Diagnosis not present

## 2022-05-31 NOTE — Progress Notes (Signed)
Therapist, nutritional Palliative Care Consult Note Telephone: [phone]  Fax: [phone]  PATIENT NAME: Alyssa Lindsey DOB: [birth year] MRN: [id number]  PRIMARY CARE PROVIDER:   Myrlene Broker, MD Myrlene Broker, MD [address] [place],  [place] [address]  REFERRING PROVIDER: Myrlene Broker, MD Myrlene Broker, MD [address] [place],  [place] [address]  RESPONSIBLE PARTY:   Self HPCOA: [name] and Reine Just to call [name] [phone] Contact Information     Name Relation Home Work Mobile   [place] Daughter [phone]     [place],[name] Daughter [phone]     Victoriya, Pol Daughter   [phone]     TELEHEALTH VISIT STATEMENT Due to the COVID-19 crisis, this visit was done via telemedicine from my office and it was initiated and consent by this patient and or family.  I connected with patient OR PROXY by a telephone/video  and verified that I am speaking with the correct person. I discussed the limitations of evaluation and management by telemedicine. Patient/proxy expressed understanding and agreed to proceed.  Alyssa Lindsey and her sister are with patient during visit.  Palliative Care was asked to follow this patient to address advance care planning, complex medical decision making and goals of care clarification.   This is a follow up visit.  RECOMMENDATIONS/PLAN:   Advance Care Planning/Code Status: Patient is currently a Full code. Family will discuss if and when to change it.   Goals of Care: Goals of care include to maximize quality of life and symptom management.  Palliative care team will continue to support patient, patient's family, and medical team.  Symptom management/Plan:  Edema to BLE: Improved, related to chronic congestive heart failure. Reports non pitting edema.  No respiratory distress, no cough, no malaise.  Continue HTCZ and Lasix as ordered.   Education reiterated to elevate BLE  during the day as much as possible to promote circulation.  Adhere to fluid and salt limits.  Monitor weight closely and report gait weight gain of 2 pounds in a day or 5 pounds in a week.   Follow-up with PCP.  Routine CBC BMP.  Dementia: ongoing memory loss and confusion in line with Dementia disease trajectory, now incontinent of bowel and bladder, down to FAST 6D from FAST 6C last visit. Safety and Fall precautions in place.  Continue Aricept and ongoing supportive care. Encourage reminiscence,  continue word puzzle/search/Bingo  Agitation: Continue with Haldol. Deescalation techniques; redirection.   Pain: left shoulder. Heating pad encouraged. Continue lidocaine patch as ordered, OTC Tylenol 500mg  po TID prn pain.  HTN: Managed with Amlodipine, Losartan. Anxiety: Managed with Lorazepam Follow up: Palliative care will continue to follow for complex medical decision making, advance care planning, and clarification of goals. Return 6 weeks or prn. Encouraged to call provider sooner with any concerns.   CHIEF COMPLAINT: Palliative follow up   HISTORY OF PRESENT ILLNESS: Alyssa Lindsey a 88 y.o. female with multiple medical problems including multiple comorbidities requiring close monitoring: Congestive heart failure, dementia, hypertension, A. fib, agitation, anxiety, weight loss.  History obtained from review of EMR, discussion with primary team, family and/or patient. Records reviewed and summarized above.  Independent interpretation of tests, reviewed as needed, available labs, patient records, imaging, studies and related documents from the EMR. All 10 point systems reviewed and are negative except as documented in history of present illness above  Review and summarization of Epic records shows history from other  than patient.   Palliative Care was asked to follow this patient o help address complex decision making in the context of advance care planning and goals of care  clarification.  PERTINENT MEDICATIONS:  Outpatient Encounter Medications as of 05/31/2022  Medication Sig   haloperidol (HALDOL) 0.5 MG tablet TAKE 1 TABLET BY MOUTH EVERY 8 HOURS AS NEEDED FOR AGITATION   acetaminophen (TYLENOL) 650 MG CR tablet Take 1,300 mg by mouth every 8 (eight) hours as needed for pain.   albuterol (PROVENTIL) (2.5 MG/3ML) 0.083% nebulizer solution Take 3 mLs (2.5 mg total) by nebulization every 6 (six) hours as needed for wheezing or shortness of breath.   albuterol (VENTOLIN HFA) 108 (90 Base) MCG/ACT inhaler Inhale 2 puffs into the lungs every 8 (eight) hours.   ALPRAZolam (XANAX) 0.25 MG tablet Take 1 tablet by mouth twice daily as needed for anxiety   amLODipine (NORVASC) 5 MG tablet Take 1 tablet by mouth once daily   Capsaicin 0.075 % STCK .075% topically twice per day   cetirizine (ZYRTEC) 10 MG tablet TAKE ONE TABLET BY MOUTH ONCE DAILY   donepezil (ARICEPT) 5 MG tablet TAKE 1 TABLET BY MOUTH AT BEDTIME   famotidine (PEPCID) 20 MG tablet Take 1 tablet (20 mg total) by mouth 2 (two) times daily.   furosemide (LASIX) 20 MG tablet TAKE 1 TABLET BY MOUTH AS NEEDED FOR EDEMA   hydrochlorothiazide (MICROZIDE) 12.5 MG capsule Take 1 capsule (12.5 mg total) by mouth daily.   lidocaine (LIDODERM) 5 % Place 2 patches onto the skin daily. Remove & Discard patch within 12 hours or as directed by MD (Patient taking differently: Place 2 patches onto the skin daily. Remove & Discard patch within 12 hours or as directed by MD prn)   losartan (COZAAR) 50 MG tablet Take 1 tablet by mouth once daily   megestrol (MEGACE) 20 MG tablet Take 1 tablet (20 mg total) by mouth daily.   mirtazapine (REMERON) 30 MG tablet Take 1 tablet (30 mg total) by mouth at bedtime.   potassium chloride SA (KLOR-CON M) 20 MEQ tablet Take 1 tablet (20 mEq total) by mouth 2 (two) times daily.   predniSONE (DELTASONE) 20 MG tablet Take 2 tablets (40 mg total) by mouth daily with breakfast.   Rivaroxaban  (XARELTO) 15 MG TABS tablet Take 1 tablet (15 mg total) by mouth daily with supper.   VITAMIN D, CHOLECALCIFEROL, PO Take 1 tablet by mouth daily. gummy   No facility-administered encounter medications on file as of 05/31/2022.    HOSPICE ELIGIBILITY/DIAGNOSIS: TBD  PAST MEDICAL HISTORY:  Past Medical History:  Diagnosis Date   Arthritis    Atrial fibrillation (HCC)    Chronic neck pain    Diverticulosis of colon (without mention of hemorrhage)    Family history of malignant neoplasm of gastrointestinal tract    Gastritis    GERD (gastroesophageal reflux disease)    Hiatal hernia    HTN (hypertension)    LBP (low back pain)    Memory problem    Spondylosis    Thrombocytopenia (HCC)    Type II or unspecified type diabetes mellitus without mention of complication, not stated as uncontrolled       ALLERGIES:  Allergies  Allergen Reactions   Dicyclomine Hcl Other (See Comments)    Severe eye pain   Lisinopril Cough   Aspirin Other (See Comments)    Unknown reaction     I spent 40 minutes providing this consultation; this includes  time spent with patient/family, chart review and documentation. More than 50% of the time in this consultation was spent on counseling and coordinating communication.  Thank you for the opportunity to participate in the care of SHAKEILA MARCHITTO Please call our office at [phone] if we can be of additional assistance.  Note: Portions of this note were generated with Lobbyist. Dictation errors may occur despite best attempts at proofreading.  Teodoro Spray, NP

## 2022-06-14 ENCOUNTER — Other Ambulatory Visit: Payer: Self-pay | Admitting: Internal Medicine

## 2022-06-20 ENCOUNTER — Ambulatory Visit (INDEPENDENT_AMBULATORY_CARE_PROVIDER_SITE_OTHER): Payer: Medicare HMO

## 2022-06-20 DIAGNOSIS — I442 Atrioventricular block, complete: Secondary | ICD-10-CM

## 2022-06-20 LAB — CUP PACEART REMOTE DEVICE CHECK
Battery Remaining Longevity: 141 mo
Battery Voltage: 3.04 V
Brady Statistic RV Percent Paced: 97.07 %
Date Time Interrogation Session: 20230725232223
Implantable Lead Implant Date: 20220121
Implantable Lead Location: 753860
Implantable Lead Model: 3830
Implantable Pulse Generator Implant Date: 20220121
Lead Channel Impedance Value: 361 Ohm
Lead Channel Impedance Value: 532 Ohm
Lead Channel Pacing Threshold Amplitude: 1 V
Lead Channel Pacing Threshold Pulse Width: 0.4 ms
Lead Channel Sensing Intrinsic Amplitude: 17.5 mV
Lead Channel Sensing Intrinsic Amplitude: 17.5 mV
Lead Channel Setting Pacing Amplitude: 2.5 V
Lead Channel Setting Pacing Pulse Width: 0.4 ms
Lead Channel Setting Sensing Sensitivity: 0.9 mV

## 2022-07-01 ENCOUNTER — Other Ambulatory Visit: Payer: Self-pay | Admitting: Cardiology

## 2022-07-12 ENCOUNTER — Other Ambulatory Visit: Payer: Self-pay | Admitting: Internal Medicine

## 2022-07-13 NOTE — Progress Notes (Signed)
Remote pacemaker transmission.   

## 2022-07-25 NOTE — Telephone Encounter (Signed)
Patient called back and   needs this medication sent in to her pharmacy.

## 2022-07-27 ENCOUNTER — Ambulatory Visit (INDEPENDENT_AMBULATORY_CARE_PROVIDER_SITE_OTHER): Payer: Medicare HMO | Admitting: Internal Medicine

## 2022-07-27 ENCOUNTER — Ambulatory Visit (INDEPENDENT_AMBULATORY_CARE_PROVIDER_SITE_OTHER): Payer: Medicare HMO

## 2022-07-27 ENCOUNTER — Other Ambulatory Visit: Payer: Self-pay | Admitting: Internal Medicine

## 2022-07-27 ENCOUNTER — Encounter: Payer: Self-pay | Admitting: Internal Medicine

## 2022-07-27 VITALS — BP 100/68 | HR 77 | Temp 98.4°F | Ht 65.0 in | Wt 154.0 lb

## 2022-07-27 DIAGNOSIS — R053 Chronic cough: Secondary | ICD-10-CM | POA: Diagnosis not present

## 2022-07-27 DIAGNOSIS — R634 Abnormal weight loss: Secondary | ICD-10-CM | POA: Diagnosis not present

## 2022-07-27 DIAGNOSIS — B0229 Other postherpetic nervous system involvement: Secondary | ICD-10-CM | POA: Diagnosis not present

## 2022-07-27 MED ORDER — DICLOFENAC SODIUM 1 % EX GEL: 2.0000 g | Freq: Four times a day (QID) | CUTANEOUS | 2 refills | Status: DC

## 2022-07-27 NOTE — Progress Notes (Signed)
   Subjective:   Patient ID: Alyssa Lindsey, female    DOB: 1929-05-30, 88 y.o.   MRN: [id number]  HPI The patient is a 88 YO female coming in for consistent pain in the left upper chest and shoulder and upper back from shingles. This has been stable. She is using lidocaine 5% patches which help some. Struggling with pain and limits activity. She is not moving as much or eating as much. This is causing weakness and overall has improved previously with PT in this situation.  Review of Systems  Constitutional:  Positive for activity change and appetite change.  HENT: Negative.    Eyes: Negative.   Respiratory:  Positive for cough and shortness of breath. Negative for chest tightness.   Cardiovascular:  Positive for chest pain. Negative for palpitations and leg swelling.  Gastrointestinal:  Negative for abdominal distention, abdominal pain, constipation, diarrhea, nausea and vomiting.  Musculoskeletal:  Positive for back pain.  Skin: Negative.   Neurological:  Positive for weakness.  Psychiatric/Behavioral: Negative.      Objective:  Physical Exam Constitutional:      Appearance: She is well-developed. She is ill-appearing.  HENT:     Head: Normocephalic and atraumatic.  Cardiovascular:     Rate and Rhythm: Normal rate and regular rhythm.  Pulmonary:     Effort: Pulmonary effort is normal. No respiratory distress.     Breath sounds: Normal breath sounds. No wheezing or rales.  Chest:     Chest wall: Tenderness present.  Abdominal:     General: Bowel sounds are normal. There is no distension.     Palpations: Abdomen is soft.     Tenderness: There is no abdominal tenderness. There is no rebound.  Musculoskeletal:        General: Tenderness present.     Cervical back: Normal range of motion.  Skin:    General: Skin is warm and dry.  Neurological:     Mental Status: She is alert and oriented to person, place, and time.     Coordination: Coordination normal.     Vitals:    07/27/22 1039  BP: 100/68  Pulse: 77  Temp: 98.4 F (36.9 C)  TempSrc: Oral  SpO2: 99%  Weight: 154 lb (69.9 kg)  Height: 5\' 5"  (1.651 m)    Assessment & Plan:

## 2022-07-27 NOTE — Assessment & Plan Note (Signed)
Checking CXR today to rule out infection and fluid overload.

## 2022-07-27 NOTE — Assessment & Plan Note (Signed)
Weight improved 5 pounds since April but still down about 10 pounds since last year. Megace has helped with weight/appetite and refilled today.

## 2022-07-27 NOTE — Patient Instructions (Signed)
We have sent in voltaren gel to use on the side and shoulder area.  We will check the chest x-ray today.

## 2022-07-27 NOTE — Assessment & Plan Note (Signed)
Rx voltaren gel and continue lidocaine 5% patches. Needs home health for PT/OT to help her move. She is limited some by pain.

## 2022-08-06 DIAGNOSIS — K219 Gastro-esophageal reflux disease without esophagitis: Secondary | ICD-10-CM | POA: Diagnosis not present

## 2022-08-06 DIAGNOSIS — G301 Alzheimer's disease with late onset: Secondary | ICD-10-CM | POA: Diagnosis not present

## 2022-08-06 DIAGNOSIS — M1030 Gout due to renal impairment, unspecified site: Secondary | ICD-10-CM | POA: Diagnosis not present

## 2022-08-06 DIAGNOSIS — E1122 Type 2 diabetes mellitus with diabetic chronic kidney disease: Secondary | ICD-10-CM | POA: Diagnosis not present

## 2022-08-06 DIAGNOSIS — Z950 Presence of cardiac pacemaker: Secondary | ICD-10-CM | POA: Diagnosis not present

## 2022-08-06 DIAGNOSIS — R69 Illness, unspecified: Secondary | ICD-10-CM | POA: Diagnosis not present

## 2022-08-06 DIAGNOSIS — F4323 Adjustment disorder with mixed anxiety and depressed mood: Secondary | ICD-10-CM | POA: Diagnosis not present

## 2022-08-06 DIAGNOSIS — R410 Disorientation, unspecified: Secondary | ICD-10-CM | POA: Diagnosis not present

## 2022-08-06 DIAGNOSIS — R053 Chronic cough: Secondary | ICD-10-CM | POA: Diagnosis not present

## 2022-08-06 DIAGNOSIS — R001 Bradycardia, unspecified: Secondary | ICD-10-CM | POA: Diagnosis not present

## 2022-08-06 DIAGNOSIS — G8929 Other chronic pain: Secondary | ICD-10-CM | POA: Diagnosis not present

## 2022-08-06 DIAGNOSIS — E538 Deficiency of other specified B group vitamins: Secondary | ICD-10-CM | POA: Diagnosis not present

## 2022-08-06 DIAGNOSIS — I129 Hypertensive chronic kidney disease with stage 1 through stage 4 chronic kidney disease, or unspecified chronic kidney disease: Secondary | ICD-10-CM | POA: Diagnosis not present

## 2022-08-06 DIAGNOSIS — R634 Abnormal weight loss: Secondary | ICD-10-CM | POA: Diagnosis not present

## 2022-08-06 DIAGNOSIS — G479 Sleep disorder, unspecified: Secondary | ICD-10-CM | POA: Diagnosis not present

## 2022-08-06 DIAGNOSIS — N1831 Chronic kidney disease, stage 3a: Secondary | ICD-10-CM | POA: Diagnosis not present

## 2022-08-06 DIAGNOSIS — M791 Myalgia, unspecified site: Secondary | ICD-10-CM | POA: Diagnosis not present

## 2022-08-06 DIAGNOSIS — B0229 Other postherpetic nervous system involvement: Secondary | ICD-10-CM | POA: Diagnosis not present

## 2022-08-06 DIAGNOSIS — Z6825 Body mass index (BMI) 25.0-25.9, adult: Secondary | ICD-10-CM | POA: Diagnosis not present

## 2022-08-06 DIAGNOSIS — D696 Thrombocytopenia, unspecified: Secondary | ICD-10-CM | POA: Diagnosis not present

## 2022-08-06 DIAGNOSIS — I482 Chronic atrial fibrillation, unspecified: Secondary | ICD-10-CM | POA: Diagnosis not present

## 2022-08-09 ENCOUNTER — Other Ambulatory Visit: Payer: Self-pay | Admitting: Cardiology

## 2022-08-13 ENCOUNTER — Other Ambulatory Visit: Payer: Self-pay | Admitting: Cardiology

## 2022-08-15 ENCOUNTER — Telehealth: Payer: Self-pay

## 2022-08-15 NOTE — Telephone Encounter (Signed)
(  4:33 pm) SW scheduled a follow-up visit with patient. RN/SW team scheduled to see patient on 08/22/22 @ 9:45 am.

## 2022-08-20 ENCOUNTER — Ambulatory Visit (INDEPENDENT_AMBULATORY_CARE_PROVIDER_SITE_OTHER): Payer: Medicare HMO

## 2022-08-20 VITALS — Ht 65.0 in | Wt 154.0 lb

## 2022-08-20 DIAGNOSIS — Z0000 Encounter for general adult medical examination without abnormal findings: Secondary | ICD-10-CM

## 2022-08-20 NOTE — Progress Notes (Signed)
Subjective:   Alyssa PerchesDorothy B Lindsey is a 88 y.o. female who presents for Medicare Annual (Subsequent) preventive examination.   Virtual Visit via Telephone Note  I connected with  Alyssa Lindsey on 08/20/22 at  1:30 PM EDT by telephone and verified that I am speaking with the correct person using two identifiers.  Location: Patient: home  Provider: [facility]  Persons participating in the virtual visit: patient/Nurse Health Advisor   I discussed the limitations, risks, security and privacy concerns of performing an evaluation and management service by telephone and the availability of in person appointments. The patient expressed understanding and agreed to proceed.  Interactive audio and video telecommunications were attempted between this nurse and patient, however failed, due to patient having technical difficulties OR patient did not have access to video capability.  We continued and completed visit with audio only.  Some vital signs may be absent or patient reported.   Lorrene ReidLaura L Wilson, LPN  Review of Systems     Cardiac Risk Factors include: advanced age (>1055men, 4>65 women);hypertension     Objective:    Today's Vitals   08/20/22 1333  Weight: 154 lb (69.9 kg)  Height: 5\' 5"  (1.651 m)   Body mass index is 25.63 kg/m.     08/20/2022    1:36 PM 08/14/2021    1:29 PM 01/03/2021    9:45 AM 12/16/2020    8:32 AM 12/15/2020    5:18 PM 02/24/2020    5:51 PM 05/17/2016    9:33 AM  Advanced Directives  Does Patient Have a Medical Advance Directive? Yes Yes Yes Yes Yes No No  Type of Estate agentAdvance Directive Healthcare Power of [place];Living will Living will Living will;Healthcare Power of Attorney Living will;Healthcare Power of Attorney     Does patient want to make changes to medical advance directive?  No - Patient declined No - Guardian declined No - Patient declined     Copy of Healthcare Power of Attorney in Chart? No - copy requested  No - copy requested No - copy requested      Would patient like information on creating a medical advance directive?      No - Patient declined No - patient declined information    Current Medications (verified) Outpatient Encounter Medications as of 08/20/2022  Medication Sig   acetaminophen (TYLENOL) 650 MG CR tablet Take 1,300 mg by mouth every 8 (eight) hours as needed for pain.   albuterol (PROVENTIL) (2.5 MG/3ML) 0.083% nebulizer solution Take 3 mLs (2.5 mg total) by nebulization every 6 (six) hours as needed for wheezing or shortness of breath.   albuterol (VENTOLIN HFA) 108 (90 Base) MCG/ACT inhaler Inhale 2 puffs into the lungs every 8 (eight) hours.   ALPRAZolam (XANAX) 0.25 MG tablet Take 1 tablet by mouth twice daily as needed for anxiety   amLODipine (NORVASC) 5 MG tablet Take 1 tablet by mouth once daily   Capsaicin 0.075 % STCK .075% topically twice per day   cetirizine (ZYRTEC) 10 MG tablet TAKE ONE TABLET BY MOUTH ONCE DAILY   diclofenac Sodium (VOLTAREN) 1 % GEL Apply 2 g topically 4 (four) times daily.   donepezil (ARICEPT) 5 MG tablet TAKE 1 TABLET BY MOUTH AT BEDTIME   famotidine (PEPCID) 20 MG tablet Take 1 tablet (20 mg total) by mouth 2 (two) times daily.   furosemide (LASIX) 20 MG tablet TAKE 1 TABLET BY MOUTH AS NEEDED FOR EDEMA   haloperidol (HALDOL) 0.5 MG tablet TAKE 1 TABLET BY  MOUTH EVERY 8 HOURS AS NEEDED FOR AGITATION   hydrochlorothiazide (MICROZIDE) 12.5 MG capsule Take 1 capsule (12.5 mg total) by mouth daily.   lidocaine (LIDODERM) 5 % Place 2 patches onto the skin daily. Remove & Discard patch within 12 hours or as directed by MD (Patient taking differently: Place 2 patches onto the skin daily. Remove & Discard patch within 12 hours or as directed by MD prn)   losartan (COZAAR) 50 MG tablet Take 1 tablet by mouth once daily   megestrol (MEGACE) 20 MG tablet Take 1 tablet by mouth once daily   mirtazapine (REMERON) 30 MG tablet TAKE 1 TABLET BY MOUTH AT BEDTIME   potassium chloride SA (KLOR-CON M) 20  MEQ tablet Take 1 tablet (20 mEq total) by mouth 2 (two) times daily.   predniSONE (DELTASONE) 20 MG tablet Take 2 tablets (40 mg total) by mouth daily with breakfast.   Rivaroxaban (XARELTO) 15 MG TABS tablet Take 1 tablet (15 mg total) by mouth daily with supper.   VITAMIN D, CHOLECALCIFEROL, PO Take 1 tablet by mouth daily. gummy   No facility-administered encounter medications on file as of 08/20/2022.    Allergies (verified) Dicyclomine hcl, Lisinopril, and Aspirin   History: Past Medical History:  Diagnosis Date   Arthritis    Atrial fibrillation (HCC)    Chronic neck pain    Diverticulosis of colon (without mention of hemorrhage)    Family history of malignant neoplasm of gastrointestinal tract    Gastritis    GERD (gastroesophageal reflux disease)    Hiatal hernia    HTN (hypertension)    LBP (low back pain)    Memory problem    Spondylosis    Thrombocytopenia (HCC)    Type II or unspecified type diabetes mellitus without mention of complication, not stated as uncontrolled    Past Surgical History:  Procedure Laterality Date   COLONOSCOPY  2010   NORMAL-- DUE 2020   PACEMAKER IMPLANT N/A 12/16/2020   Procedure: PACEMAKER IMPLANT;  Surgeon: Marinus Maw, MD;  Location: [facility];  Service: Cardiovascular;  Laterality: N/A;   TUBAL LIGATION     Family History  Problem Relation Age of Onset   Emphysema Brother    Stroke Father 71   Ovarian cancer Sister    Breast cancer Sister    Liver disease Brother    Colon cancer Daughter    Diabetes Daughter    Throat cancer Brother    Lung cancer Daughter    Social History   Socioeconomic History   Marital status: Widowed    Spouse name: deceased   Number of children: 4   Years of education: Not on file   Highest education level: Not on file  Occupational History   Occupation: Retired housekeeper  Tobacco Use   Smoking status: Never   Smokeless tobacco: Never  Substance and Sexual Activity   Alcohol  use: No    Alcohol/week: 0.0 standard drinks of alcohol   Drug use: No   Sexual activity: Not on file  Other Topics Concern   Not on file  Social History Narrative   Widow    4 children: 1 son '59; 3 dtrs '53, '62, '64; 9 grandchildren; great-grand   Lives with daughters, [name] & [name] in the home, grandson at home      Daily Caffeine Use:  1 cup   Regular Exercise -  NO   [name] visually impaired use braille   Son passed in 2016-17  Social Determinants of Health   Financial Resource Strain: Low Risk  (08/20/2022)   Overall Financial Resource Strain (CARDIA)    Difficulty of Paying Living Expenses: Not hard at all  Food Insecurity: No Food Insecurity (08/20/2022)   Hunger Vital Sign    Worried About Running Out of Food in the Last Year: Never true    Ran Out of Food in the Last Year: Never true  Transportation Needs: No Transportation Needs (08/20/2022)   PRAPARE - Hydrologist (Medical): No    Lack of Transportation (Non-Medical): No  Physical Activity: Insufficiently Active (08/20/2022)   Exercise Vital Sign    Days of Exercise per Week: 3 days    Minutes of Exercise per Session: 30 min  Stress: No Stress Concern Present (08/20/2022)   [place]    Feeling of Stress : Not at all  Social Connections: Moderately Isolated (08/20/2022)   Social Connection and Isolation Panel [NHANES]    Frequency of Communication with Friends and Family: More than three times a week    Frequency of Social Gatherings with Friends and Family: More than three times a week    Attends Religious Services: More than 4 times per year    Active Member of [employer] or Organizations: No    Attends Archivist Meetings: Never    Marital Status: Widowed    Tobacco Counseling Counseling given: Not Answered   Clinical Intake:  Pre-visit preparation completed: Yes  Pain : No/denies pain      Nutritional Risks: None Diabetes: No  How often do you need to have someone help you when you read instructions, pamphlets, or other written materials from your doctor or pharmacy?: 1 - Never  Diabetic?no   Interpreter Needed?: No  Information entered by :: Jadene Pierini, LPN   Activities of Daily Living    08/20/2022    1:37 PM  In your present state of health, do you have any difficulty performing the following activities:  Hearing? 1  Vision? 1  Difficulty concentrating or making decisions? 1  Walking or climbing stairs? 1  Dressing or bathing? 1  Doing errands, shopping? 1  Preparing Food and eating ? Y  Using the Toilet? Y  In the past six months, have you accidently leaked urine? Y  Do you have problems with loss of bowel control? Y  Managing your Medications? Y  Managing your Finances? Y  Housekeeping or managing your Housekeeping? Y    Patient Care Team: Hoyt Koch, MD as PCP - General (Internal Medicine) Minus Breeding, MD as PCP - Cardiology (Cardiology) Charlton Haws, [facility] as Pharmacist (Pharmacist) Szabat, Darnelle Maffucci, [facility] (Inactive) as Pharmacist (Pharmacist)  Indicate any recent Medical Services you may have received from other than [facility] providers in the past year (date may be approximate).     Assessment:   This is a routine wellness examination for Alyssa Lindsey.  Hearing/Vision screen Vision Screening - Comments:: Due annual eye exam declines   Dietary issues and exercise activities discussed: Current Exercise Habits: Home exercise routine, Type of exercise: walking;strength training/weights, Time (Minutes): 30, Frequency (Times/Week): 3, Weekly Exercise (Minutes/Week): 90, Intensity: Mild, Exercise limited by: None identified   Goals Addressed             This Visit's Progress    Exercise 3x per week (30 min per time)         Depression Screen    08/20/2022  1:36 PM 03/23/2022    9:23 AM 08/14/2021    1:17 PM 05/16/2021    11:26 AM 03/15/2021    3:41 PM 01/03/2021    9:46 AM 12/09/2020    9:32 AM  PHQ 2/9 Scores  PHQ - 2 Score 0 0 0 0 2 0 0  PHQ- 9 Score  0   4      Fall Risk    08/20/2022    1:34 PM 05/25/2022   10:00 AM 04/24/2022   10:30 AM 03/23/2022    9:23 AM 08/14/2021    1:14 PM  Fall Risk   Falls in the past year? 0 1 1 0 0  Comment  caregivers deny new/ recent falls since last outreach 04/24/22 caregivers reports one fall "about 2 weeks ago," without injury, confirm patient does not use assistive devices    Number falls in past yr: 0 1 0 0 0  Injury with Fall? 0 0 0 0 0  Risk for fall due to : No Fall Risks Medication side effect;Impaired mobility;History of fall(s) History of fall(s);Medication side effect;Mental status change  No Fall Risks  Follow up Falls prevention discussed Falls prevention discussed Falls prevention discussed  Falls evaluation completed    FALL RISK PREVENTION PERTAINING TO THE HOME:  Any stairs in or around the home? Yes  If so, are there any without handrails? No  Home free of loose throw rugs in walkways, pet beds, electrical cords, etc? Yes  Adequate lighting in your home to reduce risk of falls? Yes   ASSISTIVE DEVICES UTILIZED TO PREVENT FALLS:  Life alert? No  Use of a cane, walker or w/c? Yes  Grab bars in the bathroom? Yes  Shower chair or bench in shower? No  Elevated toilet seat or a handicapped toilet? Yes       06/23/2015    8:32 AM  MMSE - Mini Mental State Exam  Orientation to time 4  Orientation to Place 4  Registration 3  Attention/ Calculation 0  Recall 2  Language- name 2 objects 2  Language- repeat 1  Language- follow 3 step command 3  Language- read & follow direction 0  Write a sentence 1  Copy design 0  Total score 20        Immunizations Immunization History  Administered Date(s) Administered   Fluad Quad(high Dose 65+) 09/11/2019, 10/26/2019, 11/10/2020, 09/27/2021   Influenza Whole 09/07/2005, 09/16/2008   Influenza, High  Dose Seasonal PF 11/20/2016, 08/06/2017, 08/12/2018   Influenza,inj,Quad PF,6+ Mos 08/31/2013, 09/02/2014, 09/06/2015   Moderna Sars-Covid-2 Vaccination 02/03/2020, 03/10/2020   Pneumococcal Conjugate-13 03/24/2015   Pneumococcal Polysaccharide-23 10/30/2006, 12/02/2019   Td 05/25/1996, 03/02/2002   Tdap 03/24/2015    TDAP status: Up to date  Flu Vaccine status: Due, Education has been provided regarding the importance of this vaccine. Advised may receive this vaccine at local pharmacy or Health Dept. Aware to provide a copy of the vaccination record if obtained from local pharmacy or Health Dept. Verbalized acceptance and understanding.  Pneumococcal vaccine status: Up to date  Covid-19 vaccine status: Completed vaccines  Qualifies for Shingles Vaccine? Yes   Zostavax completed No   Shingrix Completed?: No.    Education has been provided regarding the importance of this vaccine. Patient has been advised to call insurance company to determine out of pocket expense if they have not yet received this vaccine. Advised may also receive vaccine at local pharmacy or Health Dept. Verbalized acceptance and understanding.  Screening Tests Health  Maintenance  Topic Date Due   OPHTHALMOLOGY EXAM  Never done   COVID-19 Vaccine (3 - Moderna risk series) 04/07/2020   HEMOGLOBIN A1C  03/27/2022   INFLUENZA VACCINE  02/24/2023 (Originally 06/26/2022)   FOOT EXAM  09/27/2022   TETANUS/TDAP  03/23/2025   Pneumonia Vaccine 53+ Years old  Completed   DEXA SCAN  Completed   HPV VACCINES  Aged Out   Zoster Vaccines- Shingrix  Discontinued    Health Maintenance  Health Maintenance Due  Topic Date Due   OPHTHALMOLOGY EXAM  Never done   COVID-19 Vaccine (3 - Moderna risk series) 04/07/2020   HEMOGLOBIN A1C  03/27/2022    Colorectal cancer screening: No longer required.   Mammogram status: No longer required due to age.  Bone Density status: Ordered declined at this time . Pt provided with  contact info and advised to call to schedule appt.  Lung Cancer Screening: (Low Dose CT Chest recommended if Age 44-80 years, 30 pack-year currently smoking OR have quit w/in 15years.) does not qualify.   Lung Cancer Screening Referral: n/a  Additional Screening:  Hepatitis C Screening: does not qualify;   Vision Screening: Recommended annual ophthalmology exams for early detection of glaucoma and other disorders of the eye. Is the patient up to date with their annual eye exam?  No  Who is the provider or what is the name of the office in which the patient attends annual eye exams? None, patient declines  If pt is not established with a provider, would they like to be referred to a provider to establish care? No .   Dental Screening: Recommended annual dental exams for proper oral hygiene  Community Resource Referral / Chronic Care Management: CRR required this visit?  No   CCM required this visit?  No      Plan:     I have personally reviewed and noted the following in the patient's chart:   Medical and social history Use of alcohol, tobacco or illicit drugs  Current medications and supplements including opioid prescriptions. Patient is not currently taking opioid prescriptions. Functional ability and status Nutritional status Physical activity Advanced directives List of other physicians Hospitalizations, surgeries, and ER visits in previous 12 months Vitals Screenings to include cognitive, depression, and falls Referrals and appointments  In addition, I have reviewed and discussed with patient certain preventive protocols, quality metrics, and best practice recommendations. A written personalized care plan for preventive services as well as general preventive health recommendations were provided to patient.     Lorrene Reid, LPN   [id number]   Nurse Notes: due DEXA, patient declined  Due Eye exam, patient declined/Due flu vaccine

## 2022-08-20 NOTE — Patient Instructions (Signed)
Alyssa Lindsey , Thank you for taking time to come for your Medicare Wellness Visit. I appreciate your ongoing commitment to your health goals. Please review the following plan we discussed and let me know if I can assist you in the future.   These are the goals we discussed:  Goals      Exercise 3x per week (30 min per time)        This is a list of the screening recommended for you and due dates:  Health Maintenance  Topic Date Due   Eye exam for diabetics  Never done   COVID-19 Vaccine (3 - Moderna risk series) 04/07/2020   Hemoglobin A1C  03/27/2022   Flu Shot  02/24/2023*   Complete foot exam   09/27/2022   Tetanus Vaccine  03/23/2025   Pneumonia Vaccine  Completed   DEXA scan (bone density measurement)  Completed   HPV Vaccine  Aged Out   Zoster (Shingles) Vaccine  Discontinued  *Topic was postponed. The date shown is not the original due date.    Advanced directives: Please bring a copy of your health care power of attorney and living will to the office to be added to your chart at your convenience.   Conditions/risks identified: Aim for 30 minutes of exercise or brisk walking, 6-8 glasses of water, and 5 servings of fruits and vegetables each day.   Next appointment: Follow up in one year for your annual wellness visit    Preventive Care 65 Years and Older, Female Preventive care refers to lifestyle choices and visits with your health care provider that can promote health and wellness. What does preventive care include? A yearly physical exam. This is also called an annual well check. Dental exams once or twice a year. Routine eye exams. Ask your health care provider how often you should have your eyes checked. Personal lifestyle choices, including: Daily care of your teeth and gums. Regular physical activity. Eating a healthy diet. Avoiding tobacco and drug use. Limiting alcohol use. Practicing safe sex. Taking low-dose aspirin every day. Taking vitamin and mineral  supplements as recommended by your health care provider. What happens during an annual well check? The services and screenings done by your health care provider during your annual well check will depend on your age, overall health, lifestyle risk factors, and family history of disease. Counseling  Your health care provider may ask you questions about your: Alcohol use. Tobacco use. Drug use. Emotional well-being. Home and relationship well-being. Sexual activity. Eating habits. History of falls. Memory and ability to understand (cognition). Work and work Statistician. Reproductive health. Screening  You may have the following tests or measurements: Height, weight, and BMI. Blood pressure. Lipid and cholesterol levels. These may be checked every 5 years, or more frequently if you are over 11 years old. Skin check. Lung cancer screening. You may have this screening every year starting at age 86 if you have a 30-pack-year history of smoking and currently smoke or have quit within the past 15 years. Fecal occult blood test (FOBT) of the stool. You may have this test every year starting at age 86. Flexible sigmoidoscopy or colonoscopy. You may have a sigmoidoscopy every 5 years or a colonoscopy every 10 years starting at age 86. Hepatitis C blood test. Hepatitis B blood test. Sexually transmitted disease (STD) testing. Diabetes screening. This is done by checking your blood sugar (glucose) after you have not eaten for a while (fasting). You may have this done every 1-3  years. Bone density scan. This is done to screen for osteoporosis. You may have this done starting at age 86. Mammogram. This may be done every 1-2 years. Talk to your health care provider about how often you should have regular mammograms. Talk with your health care provider about your test results, treatment options, and if necessary, the need for more tests. Vaccines  Your health care provider may recommend certain  vaccines, such as: Influenza vaccine. This is recommended every year. Tetanus, diphtheria, and acellular pertussis (Tdap, Td) vaccine. You may need a Td booster every 10 years. Zoster vaccine. You may need this after age 86. Pneumococcal 13-valent conjugate (PCV13) vaccine. One dose is recommended after age 86. Pneumococcal polysaccharide (PPSV23) vaccine. One dose is recommended after age 86. Talk to your health care provider about which screenings and vaccines you need and how often you need them. This information is not intended to replace advice given to you by your health care provider. Make sure you discuss any questions you have with your health care provider. Document Released: 12/09/2015 Document Revised: 08/01/2016 Document Reviewed: 09/13/2015 Elsevier Interactive Patient Education  2017 [place] Prevention in the Home Falls can cause injuries. They can happen to people of all ages. There are many things you can do to make your home safe and to help prevent falls. What can I do on the outside of my home? Regularly fix the edges of walkways and driveways and fix any cracks. Remove anything that might make you trip as you walk through a door, such as a raised step or threshold. Trim any bushes or trees on the path to your home. Use bright outdoor lighting. Clear any walking paths of anything that might make someone trip, such as rocks or tools. Regularly check to see if handrails are loose or broken. Make sure that both sides of any steps have handrails. Any raised decks and porches should have guardrails on the edges. Have any leaves, snow, or ice cleared regularly. Use sand or salt on walking paths during winter. Clean up any spills in your garage right away. This includes oil or grease spills. What can I do in the bathroom? Use night lights. Install grab bars by the toilet and in the tub and shower. Do not use towel bars as grab bars. Use non-skid mats or decals in  the tub or shower. If you need to sit down in the shower, use a plastic, non-slip stool. Keep the floor dry. Clean up any water that spills on the floor as soon as it happens. Remove soap buildup in the tub or shower regularly. Attach bath mats securely with double-sided non-slip rug tape. Do not have throw rugs and other things on the floor that can make you trip. What can I do in the bedroom? Use night lights. Make sure that you have a light by your bed that is easy to reach. Do not use any sheets or blankets that are too big for your bed. They should not hang down onto the floor. Have a firm chair that has side arms. You can use this for support while you get dressed. Do not have throw rugs and other things on the floor that can make you trip. What can I do in the kitchen? Clean up any spills right away. Avoid walking on wet floors. Keep items that you use a lot in easy-to-reach places. If you need to reach something above you, use a strong step stool that has a grab  bar. Keep electrical cords out of the way. Do not use floor polish or wax that makes floors slippery. If you must use wax, use non-skid floor wax. Do not have throw rugs and other things on the floor that can make you trip. What can I do with my stairs? Do not leave any items on the stairs. Make sure that there are handrails on both sides of the stairs and use them. Fix handrails that are broken or loose. Make sure that handrails are as long as the stairways. Check any carpeting to make sure that it is firmly attached to the stairs. Fix any carpet that is loose or worn. Avoid having throw rugs at the top or bottom of the stairs. If you do have throw rugs, attach them to the floor with carpet tape. Make sure that you have a light switch at the top of the stairs and the bottom of the stairs. If you do not have them, ask someone to add them for you. What else can I do to help prevent falls? Wear shoes that: Do not have high  heels. Have rubber bottoms. Are comfortable and fit you well. Are closed at the toe. Do not wear sandals. If you use a stepladder: Make sure that it is fully opened. Do not climb a closed stepladder. Make sure that both sides of the stepladder are locked into place. Ask someone to hold it for you, if possible. Clearly mark and make sure that you can see: Any grab bars or handrails. First and last steps. Where the edge of each step is. Use tools that help you move around (mobility aids) if they are needed. These include: Canes. Walkers. Scooters. Crutches. Turn on the lights when you go into a dark area. Replace any light bulbs as soon as they burn out. Set up your furniture so you have a clear path. Avoid moving your furniture around. If any of your floors are uneven, fix them. If there are any pets around you, be aware of where they are. Review your medicines with your doctor. Some medicines can make you feel dizzy. This can increase your chance of falling. Ask your doctor what other things that you can do to help prevent falls. This information is not intended to replace advice given to you by your health care provider. Make sure you discuss any questions you have with your health care provider. Document Released: 09/08/2009 Document Revised: 04/19/2016 Document Reviewed: 12/17/2014 Elsevier Interactive Patient Education  2017 [employer].

## 2022-08-21 ENCOUNTER — Other Ambulatory Visit: Payer: Self-pay | Admitting: Internal Medicine

## 2022-08-21 ENCOUNTER — Ambulatory Visit: Payer: Medicare HMO | Admitting: Internal Medicine

## 2022-08-22 ENCOUNTER — Other Ambulatory Visit: Payer: Medicare HMO

## 2022-08-22 ENCOUNTER — Other Ambulatory Visit: Payer: Medicare HMO | Admitting: *Deleted

## 2022-08-22 VITALS — BP 112/68 | HR 67 | Temp 98.0°F | Resp 17

## 2022-08-22 DIAGNOSIS — Z515 Encounter for palliative care: Secondary | ICD-10-CM

## 2022-08-22 NOTE — Progress Notes (Signed)
COMMUNITY PALLIATIVE CARE SW NOTE  PATIENT NAME: Alyssa Lindsey DOB: 1929-07-13 MRN: [id number]  PRIMARY CARE PROVIDER: Hoyt Koch, MD  RESPONSIBLE PARTY:  Acct ID - Guarantor Home Phone Work Phone Relationship Acct Type  [id number] - Nudo,[phone]  Self P/F     [address], [place], [place] [address]   SOCIAL WORK VISIT  PC SW and RN-M. Nadara Mustard completed a follow-up visit with patient at her home. Patient ambulated to living room independently to meet with the team. Her two daughters were present with her. Patient and her daughters provided a status update on patient. Patient report that she does have some mild swelling in her ankles. She was not having any pain during this visit, but report intermittent soreness in her left shoulder and side from having the shingles three years ago. Patient also leans to her side to relieve pressure from her left side. She ambulates independently. She is receiving  physical therapy through [facility] 2x week. Patient feels that she is progressing well with physical therapy. Patient's daughters assist her dressing and bathing. She is incontinent of bowel and bladder and wears adult Depends. Her appetite is poor. She is eating at least 1 meal a day, usually dinner where she eats 50-100% of that meal. Patient also drinks Ensure for breakfast and usually skip lunch. Patient has some mild weight loss. Currently patient's urine is strong, dark, but no other symptoms noted. Patient's fluid intake is poor. The RN encouraged the patient to drink more fluids. However if this condition worsen, she should follow-up with her PCP.  No other follow-up will be done by this palliative care team.    Duration of visit and documentation: 60 minutes  Katheren Puller, LCSW

## 2022-08-23 NOTE — Telephone Encounter (Signed)
Is she tolerating this well? If so plan was to increase to 10 mg daily so we should change rx.

## 2022-08-28 NOTE — Progress Notes (Signed)
[facility] COMMUNITY PALLIATIVE CARE RN NOTE  PATIENT NAME: Alyssa Lindsey DOB: [birth year] MRN: [id number]  PRIMARY CARE PROVIDER: Hoyt Koch, MD  RESPONSIBLE PARTY: Alanda Slim (daughter) Acct ID - Guarantor Home Phone Work Phone Relationship Acct Type  [id number] ELLARAE, [id number]  Self P/F     [address] [place], [place] [address]    RN/SW team completed follow up visit to patient and her two daughters in her home.  Cognitive: Patient is alert and oriented x 3, pleasant mood, forgetful at times, intermittent confusion  Pain: Reports pain/soreness in her left shoulder. Also has nerve pain from shingles in her left mid back region from 3 years ago. Uses Lidocaine patch to help. Leans to the right side to prevent any pressure to right shoulder  Respiratory: Denies shortness of breath. Breathing even, regular and unlabored.  Mobility: Ambulatory without the use of assistive devices. Requires assistance with both bathing and dressing. No recent falls. Currently working with PT through [facility] 2x/week.  Appetite: Reports not having much of an appetite. Drinks Ensure for nutritional supplementation in the am. Usually skips breakfast and will eat 50-100% of dinner which is her best meal. Denies dysphagia.   GI/GU: Intermittently incontinent of both bowel and bladder and wears Depends. Family reports strong urine odor. No other symptoms noted. Suspect patient needs to drink more fluids since no other symptoms are present. Family admits patient does not drink well.   Cardiovascular: Trace edema in bilateral lower extremities, but improved.   Skin: Skin is very dry but they are using daily moisturizers to help  CODE STATUS: Full Code ADVANCED DIRECTIVES: Y MOST FORM: no PPS: 50%   PHYSICAL EXAM:   VITALS: Today's Vitals   08/22/22 1049  BP: 112/68  Pulse: 67  Resp: 17  Temp: 98 F (36.7 C)  TempSrc: Temporal  SpO2: 99%  PainSc: 3   PainLoc:  Shoulder    LUNGS: clear to auscultation  CARDIAC: Cor RRR EXTREMITIES: Trace edema to bilateral lower extremities SKIN:  No issues other than dry skin: using moisturizers   NEURO:  Alert and oriented x 3, forgetful, intermittent confusion, ambulatory   Daryl Eastern, RN BSN

## 2022-09-10 ENCOUNTER — Telehealth: Payer: Medicare HMO | Admitting: Hospice

## 2022-09-13 ENCOUNTER — Other Ambulatory Visit: Payer: Self-pay | Admitting: Internal Medicine

## 2022-09-19 ENCOUNTER — Ambulatory Visit (INDEPENDENT_AMBULATORY_CARE_PROVIDER_SITE_OTHER): Payer: Medicare HMO

## 2022-09-19 ENCOUNTER — Other Ambulatory Visit: Payer: Self-pay | Admitting: Internal Medicine

## 2022-09-19 DIAGNOSIS — I442 Atrioventricular block, complete: Secondary | ICD-10-CM

## 2022-09-20 LAB — CUP PACEART REMOTE DEVICE CHECK
Battery Remaining Longevity: 140 mo
Battery Voltage: 3.03 V
Brady Statistic RV Percent Paced: 58.54 %
Date Time Interrogation Session: 20231025041336
Implantable Lead Connection Status: 753985
Implantable Lead Implant Date: 20220121
Implantable Lead Location: 753860
Implantable Lead Model: 3830
Implantable Pulse Generator Implant Date: 20220121
Lead Channel Impedance Value: 380 Ohm
Lead Channel Impedance Value: 532 Ohm
Lead Channel Pacing Threshold Amplitude: 1 V
Lead Channel Pacing Threshold Pulse Width: 0.4 ms
Lead Channel Sensing Intrinsic Amplitude: 31.625 mV
Lead Channel Sensing Intrinsic Amplitude: 31.625 mV
Lead Channel Setting Pacing Amplitude: 2.5 V
Lead Channel Setting Pacing Pulse Width: 0.4 ms
Lead Channel Setting Sensing Sensitivity: 0.9 mV
Zone Setting Status: 755011

## 2022-09-20 NOTE — Telephone Encounter (Signed)
Last refill 05/17/22. Please advise

## 2022-10-02 NOTE — Progress Notes (Signed)
Remote pacemaker transmission.   

## 2022-10-12 ENCOUNTER — Other Ambulatory Visit: Payer: Self-pay | Admitting: Internal Medicine

## 2022-10-14 ENCOUNTER — Other Ambulatory Visit: Payer: Self-pay | Admitting: Internal Medicine

## 2022-10-22 ENCOUNTER — Other Ambulatory Visit: Payer: Self-pay | Admitting: Internal Medicine

## 2022-10-23 NOTE — Progress Notes (Deleted)
Cardiology Office Note Date:  10/23/2022  Patient ID:  Alyssa Lindsey, Alyssa Lindsey 1929-04-01, MRN [id number] PCP:  Myrlene Broker, MD  Cardiologist:  Dr. Antoine Poche Electrophysiologist: Dr. Ladona Ridgel  ***refresh   Chief Complaint: *** annual visit  History of Present Illness: Alyssa Lindsey is a 88 y.o. female with history of AFib (described as chronic), CHB w/PPM, dementia, HTN, CBP, DM  She saw Dr. Ladona Ridgel May 2022, HRs were controlled, she was doing well though struggling with her back pain.  NO changes were made.  She saw Dr. Antoine Poche March this year, c/o of a chronic sounding SOB, details unclear with poor memory, sounds like a pulm eval had been underway but incomplete.  Did mentioned she was quite deconditioned and SOB likely multifactorial with this and her advanced age though did recommend that she see pulm another time.  Did not think she had volume OL. No changes were made.  She is followed by palliative care, full code, ongoing chronic residual L back pain 2/2 shingle infection years prior, generally poor intake.  *** xarelto, dose, bleeding *** HRs  Device information MDT single chamber PPM implanted 12/16/20   Past Medical History:  Diagnosis Date   Arthritis    Atrial fibrillation (HCC)    Chronic neck pain    Diverticulosis of colon (without mention of hemorrhage)    Family history of malignant neoplasm of gastrointestinal tract    Gastritis    GERD (gastroesophageal reflux disease)    Hiatal hernia    HTN (hypertension)    LBP (low back pain)    Memory problem    Spondylosis    Thrombocytopenia (HCC)    Type II or unspecified type diabetes mellitus without mention of complication, not stated as uncontrolled     Past Surgical History:  Procedure Laterality Date   COLONOSCOPY  2010   NORMAL-- DUE 2020   PACEMAKER IMPLANT N/A 12/16/2020   Procedure: PACEMAKER IMPLANT;  Surgeon: Marinus Maw, MD;  Location: [facility] INVAS[facility];  Service:  Cardiovascular;  Laterality: N/A;   TUBAL LIGATION      Current Outpatient Medications  Medication Sig Dispense Refill   acetaminophen (TYLENOL) 650 MG CR tablet Take 1,300 mg by mouth every 8 (eight) hours as needed for pain.     albuterol (PROVENTIL) (2.5 MG/3ML) 0.083% nebulizer solution Take 3 mLs (2.5 mg total) by nebulization every 6 (six) hours as needed for wheezing or shortness of breath. 150 mL 1   albuterol (VENTOLIN HFA) 108 (90 Base) MCG/ACT inhaler Inhale 2 puffs into the lungs every 8 (eight) hours. 8 g 2   ALPRAZolam (XANAX) 0.25 MG tablet Take 1 tablet by mouth twice daily as needed for anxiety 60 tablet 0   amLODipine (NORVASC) 5 MG tablet Take 1 tablet by mouth once daily 90 tablet 0   Capsaicin 0.075 % STCK .075% topically twice per day 1 g 1   cetirizine (ZYRTEC) 10 MG tablet TAKE ONE TABLET BY MOUTH ONCE DAILY 30 tablet 11   diclofenac Sodium (VOLTAREN) 1 % GEL Apply 2 g topically 4 (four) times daily. 100 g 2   donepezil (ARICEPT) 10 MG tablet Take 1 tablet (10 mg total) by mouth at bedtime. 90 tablet 3   famotidine (PEPCID) 20 MG tablet Take 1 tablet (20 mg total) by mouth 2 (two) times daily. 60 tablet 2   furosemide (LASIX) 20 MG tablet TAKE 1 TABLET BY MOUTH AS NEEDED FOR EDEMA 30 tablet 8  haloperidol (HALDOL) 0.5 MG tablet TAKE 1 TABLET BY MOUTH EVERY 8 HOURS AS NEEDED FOR  AGITATION 90 tablet 0   hydrochlorothiazide (MICROZIDE) 12.5 MG capsule Take 1 capsule (12.5 mg total) by mouth daily. 90 capsule 0   lidocaine (LIDODERM) 5 % Place 2 patches onto the skin daily. Remove & Discard patch within 12 hours or as directed by MD (Patient taking differently: Place 2 patches onto the skin daily. Remove & Discard patch within 12 hours or as directed by MD prn) 60 patch 0   losartan (COZAAR) 50 MG tablet Take 1 tablet by mouth once daily 90 tablet 3   megestrol (MEGACE) 20 MG tablet Take 1 tablet by mouth once daily 90 tablet 0   mirtazapine (REMERON) 30 MG tablet TAKE 1  TABLET BY MOUTH AT BEDTIME 90 tablet 1   potassium chloride SA (KLOR-CON M) 20 MEQ tablet Take 1 tablet (20 mEq total) by mouth 2 (two) times daily. 6 tablet 0   predniSONE (DELTASONE) 20 MG tablet Take 2 tablets (40 mg total) by mouth daily with breakfast. 10 tablet 0   Rivaroxaban (XARELTO) 15 MG TABS tablet Take 1 tablet (15 mg total) by mouth daily with supper. 60 tablet 5   VITAMIN D, CHOLECALCIFEROL, PO Take 1 tablet by mouth daily. gummy     No current facility-administered medications for this visit.    Allergies:   Dicyclomine hcl, Lisinopril, and Aspirin   Social History:  The patient  reports that she has never smoked. She has never used smokeless tobacco. She reports that she does not drink alcohol and does not use drugs.   Family History:  The patient's family history includes Breast cancer in her sister; Colon cancer in her daughter; Diabetes in her daughter; Emphysema in her brother; Liver disease in her brother; Lung cancer in her daughter; Ovarian cancer in her sister; Stroke (age of onset: 23) in her father; Throat cancer in her brother.  ROS:  Please see the history of present illness.    All other systems are reviewed and otherwise negative.   PHYSICAL EXAM:  VS:  There were no vitals taken for this visit. BMI: There is no height or weight on file to calculate BMI. Well nourished, well developed, in no acute distress HEENT: normocephalic, atraumatic Neck: no JVD, carotid bruits or masses Cardiac:  *** RRR; no significant murmurs, no rubs, or gallops Lungs:  *** CTA b/l, no wheezing, rhonchi or rales Abd: soft, nontender MS: no deformity or *** atrophy Ext: *** no edema Skin: warm and dry, no rash Neuro:  No gross deficits appreciated Psych: euthymic mood, full affect  *** PPM site is stable, no tethering or discomfort   EKG:  Done today and reviewed by myself shows  ***  Device interrogation done today and reviewed by myself:  ***   04/18/2020: TTE 1.  Left ventricular ejection fraction, by estimation, is 60 to 65%. The  left ventricle has normal function. The left ventricle has no regional  wall motion abnormalities. There is severe left ventricular hypertrophy of  the basal-septal segment. Left  ventricular diastolic parameters are indeterminate.   2. Right ventricular systolic function is normal. The right ventricular  size is normal. There is mildly elevated pulmonary artery systolic  pressure.   3. The mitral valve is normal in structure. Mild mitral valve  regurgitation. No evidence of mitral stenosis.   4. The aortic valve is normal in structure. Aortic valve regurgitation is  not visualized. No  aortic stenosis is present.   5. The inferior vena cava is normal in size with greater than 50%  respiratory variability, suggesting right atrial pressure of 3 mmHg.   Recent Labs: 02/23/2022: Pro B Natriuretic peptide (BNP) 292.0 03/23/2022: ALT 8; BUN 12; Creatinine, Ser 1.46; Hemoglobin 11.4; Platelets 143.0; Potassium 3.1; Sodium 140; TSH 4.17  No results found for requested labs within last 365 days.   CrCl cannot be calculated (Patient's most recent lab result is older than the maximum 21 days allowed.).   Wt Readings from Last 3 Encounters:  08/20/22 154 lb (69.9 kg)  07/27/22 154 lb (69.9 kg)  03/23/22 150 lb 3.2 oz (68.1 kg)     Other studies reviewed: Additional studies/records reviewed today include: summarized above  ASSESSMENT AND PLAN:  PPM ***  Permanent AFib CHA2DS2Vasc is 4, on Xarelto, *** appropriately dosed *** HRs  HTN ***    Disposition: F/u with ***  Current medicines are reviewed at length with the patient today.  The patient did not have any concerns regarding medicines.  Norma Fredrickson, PA-C 10/23/2022 6:39 AM     [facility] [address] [place] [address] [phone] (office)  [phone] (fax)

## 2022-10-24 ENCOUNTER — Encounter: Payer: Medicare HMO | Admitting: Physician Assistant

## 2022-10-27 ENCOUNTER — Other Ambulatory Visit: Payer: Self-pay

## 2022-10-27 ENCOUNTER — Encounter (HOSPITAL_COMMUNITY): Payer: Self-pay | Admitting: Emergency Medicine

## 2022-10-27 ENCOUNTER — Ambulatory Visit (HOSPITAL_COMMUNITY)
Admission: EM | Admit: 2022-10-27 | Discharge: 2022-10-27 | Disposition: A | Discharge status: Home / Self Care | Payer: Medicare HMO | Attending: Family Medicine | Admitting: Family Medicine

## 2022-10-27 DIAGNOSIS — B372 Candidiasis of skin and nail: Secondary | ICD-10-CM

## 2022-10-27 DIAGNOSIS — R8290 Unspecified abnormal findings in urine: Secondary | ICD-10-CM | POA: Diagnosis not present

## 2022-10-27 DIAGNOSIS — M25562 Pain in left knee: Secondary | ICD-10-CM

## 2022-10-27 LAB — POCT URINALYSIS DIPSTICK, ED / UC
Bilirubin Urine: NEGATIVE
Glucose, UA: NEGATIVE mg/dL
Hgb urine dipstick: NEGATIVE
Ketones, ur: NEGATIVE mg/dL
Nitrite: NEGATIVE
Protein, ur: NEGATIVE mg/dL
Specific Gravity, Urine: 1.015 (ref 1.005–1.030)
Urobilinogen, UA: 0.2 mg/dL (ref 0.0–1.0)
pH: 5.5 (ref 5.0–8.0)

## 2022-10-27 MED ORDER — CLOTRIMAZOLE 1 % EX CREA: TOPICAL_CREAM | CUTANEOUS | 0 refills | Status: DC

## 2022-10-27 MED ORDER — PREDNISONE 20 MG PO TABS: 40.0000 mg | ORAL_TABLET | Freq: Every day | ORAL | 0 refills | Status: DC

## 2022-10-27 NOTE — ED Triage Notes (Signed)
Pt here with caregiver c/o left knee pain and swelling into upper thigh x 3 days; denies injury or fall; pt limping not per norm per family; per family they also feel pt may be dehydrated

## 2022-10-27 NOTE — Discharge Instructions (Addendum)
You have had labs (urine culture) sent today. We will call you with any significant abnormalities or if there is need to begin or change treatment or pursue further follow up.  You may also review your test results online through MyChart. If you do not have a MyChart account, instructions to sign up should be on your discharge paperwork.  Follow up with an orthopaedist regarding your knee pain.  [facility] [address] [place], [place] [address] Phone: [phone]  [facility]: Walk in M-F 8 am to 8 pm [address], [place], [place] [address] Phone: [phone]  [facility]: By Appointment M-F 8 - 5 pm Walk-in M-F 5:30 - 9 pm, Saturday 9 am - 2 pm, Sunday 10 am - 2 pm [address], [place], [place] [address] Phone: [phone]

## 2022-10-28 LAB — URINE CULTURE

## 2022-10-29 NOTE — ED Provider Notes (Signed)
[facility]   [id number] 10/27/22 Arrival Time: 1154  ASSESSMENT & PLAN:  1. Acute pain of left knee   2. Abnormal urine odor   3. Skin yeast infection    Will take Tylenol for knee pain. Unclear etiology.  Urine culture sent/pending. No empiric treatment.  Labs Reviewed  POCT URINALYSIS DIPSTICK, ED / UC - Abnormal; Notable for the following components:   Leukocytes,Ua TRACE (*)    All other components within normal limits     Discharge Instructions      You have had labs (urine culture) sent today. We will call you with any significant abnormalities or if there is need to begin or change treatment or pursue further follow up.  You may also review your test results online through MyChart. If you do not have a MyChart account, instructions to sign up should be on your discharge paperwork.  Follow up with an orthopaedist regarding your knee pain.  [facility] [address] [place], [place] [address] Phone: [phone]  [facility]: Walk in M-F 8 am to 8 pm [address], [place], [place] [address] Phone: [phone]  [facility]: By Appointment M-F 8 - 5 pm Walk-in M-F 5:30 - 9 pm, Saturday 9 am - 2 pm, Sunday 10 am - 2 pm [address], [place], [place] [address] Phone: [phone]    Trial of prednisone may help knee; ques inflammatory component. Clotrimazole for apparent buttock  Meds ordered this encounter  Medications   predniSONE (DELTASONE) 20 MG tablet    Sig: Take 2 tablets (40 mg total) by mouth daily.    Dispense:  10 tablet    Refill:  0   clotrimazole (LOTRIMIN) 1 % cream    Sig: Apply to affected area 2 times daily for up to one week.    Dispense:  45 g    Refill:  0    Orders Placed This Encounter  Procedures   Urine Culture   POC Urinalysis dipstick    Recommend:  Follow-up Information     Myrlene Broker, MD.   Specialty: Internal Medicine Why: If worsening or  failing to improve as anticipated. Contact information: [address] [place] [address] [phone]                Reviewed expectations re: course of current medical issues. Questions answered. Outlined signs and symptoms indicating need for more acute intervention. Patient verbalized understanding. After Visit Summary given.  SUBJECTIVE: History from: patient and caregiver. Alyssa Lindsey is a 88 y.o. female whose caregiver reports left knee pain and swelling and pain; x 3 days; no trauma/injury. A little better today. Ambulatory with assistance (baseline). No extremity sensation changes or weakness.  Also with reported strong urine odor. No specific urinary frequency or dysuria or hematuria. Afebrile. No MS changes or confusion. No h/o recurrent UTI. Also with "a rash" on buttocks within cleft; red and irritated at times. Unsure of duration; noted past few days. No diarrhea. No tx PTA.   Past Surgical History:  Procedure Laterality Date   COLONOSCOPY  2010   NORMAL-- DUE 2020   PACEMAKER IMPLANT N/A 12/16/2020   Procedure: PACEMAKER IMPLANT;  Surgeon: Marinus Maw, MD;  [facility]: [facility];  Service: Cardiovascular;  Laterality: N/A;   TUBAL LIGATION       OBJECTIVE:  Vitals:   10/27/22 1413  BP: (!) 144/63  Pulse: 61  Resp: 18  Temp: 98.5 F (36.9 C)  TempSrc: Oral  SpO2: 100%    General appearance: alert; no distress HEENT: [place]; AT Neck: supple with FROM Resp: unlabored respirations Extremities: LLE: warm with well perfused appearance; no specific bony tenderness over knee; with mild swelling; without gross deformities; bruising: none; knee ROM: normal CV: brisk extremity capillary refill of LLE; 1+ DP pulse of LLE. Skin: warm and dry; large patches of erythema and slightly weepy skin over inner buttocks; no signs of bacterial skin infection; no skin breakdown or ulcerations Neurologic: normal sensation and strength of bilateral  LE Psychological: alert and cooperative; normal mood and affect    Allergies  Allergen Reactions   Dicyclomine Hcl Other (See Comments)    Severe eye pain   Lisinopril Cough   Aspirin Other (See Comments)    Unknown reaction    Past Medical History:  Diagnosis Date   Arthritis    Atrial fibrillation (HCC)    Chronic neck pain    Diverticulosis of colon (without mention of hemorrhage)    Family history of malignant neoplasm of gastrointestinal tract    Gastritis    GERD (gastroesophageal reflux disease)    Hiatal hernia    HTN (hypertension)    LBP (low back pain)    Memory problem    Spondylosis    Thrombocytopenia (HCC)    Type II or unspecified type diabetes mellitus without mention of complication, not stated as uncontrolled    Social History   Socioeconomic History   Marital status: Widowed    Spouse name: deceased   Number of children: 4   Years of education: Not on file   Highest education level: Not on file  Occupational History   Occupation: Retired housekeeper  Tobacco Use   Smoking status: Never   Smokeless tobacco: Never  Substance and Sexual Activity   Alcohol use: No    Alcohol/week: 0.0 standard drinks of alcohol   Drug use: No   Sexual activity: Not on file  Other Topics Concern   Not on file  Social History Narrative   Widow    4 children: 1 son '59; 3 dtrs '53, '62, '64; 9 grandchildren; great-grand   Lives with daughters, [name] & [name] in the home, grandson at home      Daily Caffeine Use:  1 cup   Regular Exercise -  NO   [name] visually impaired use braille   Son passed in 2016-17   Social Determinants of Health   Financial Resource Strain: [place]  (08/20/2022)   Overall Financial Resource Strain (CARDIA)    Difficulty of Paying Living Expenses: Not hard at all  Food Insecurity: No Food Insecurity (08/20/2022)   Hunger Vital Sign    Worried About Running Out of Food in the Last Year: Never true    [place] in the Last  Year: Never true  Transportation Needs: No Transportation Needs (08/20/2022)   PRAPARE - Hydrologist (Medical): No    Lack of Transportation (Non-Medical): No  Physical Activity: Insufficiently Active (08/20/2022)   Exercise Vital Sign    Days of Exercise per Week: 3 days    Minutes of Exercise per Session: 30 min  Stress: No Stress Concern Present (08/20/2022)   [place]    Feeling of Stress : Not at all  Social Connections: Moderately Isolated (08/20/2022)   Social Connection and Isolation Panel [NHANES]    Frequency of  Communication with Friends and Family: More than three times a week    Frequency of Social Gatherings with Friends and Family: More than three times a week    Attends Religious Services: More than 4 times per year    Active Member of [employer] or Organizations: No    Attends Archivist Meetings: Never    Marital Status: Widowed   Family History  Problem Relation Age of Onset   Emphysema Brother    Stroke Father 25   Ovarian cancer Sister    Breast cancer Sister    Liver disease Brother    Colon cancer Daughter    Diabetes Daughter    Throat cancer Brother    Lung cancer Daughter    Past Surgical History:  Procedure Laterality Date   COLONOSCOPY  2010   NORMAL-- DUE 2020   PACEMAKER IMPLANT N/A 12/16/2020   Procedure: PACEMAKER IMPLANT;  Surgeon: Evans Lance, MD;  Location: [facility];  Service: Cardiovascular;  Laterality: N/A;   TUBAL LIGATION         Vanessa Kick, MD 10/29/22 1322

## 2022-11-05 ENCOUNTER — Telehealth: Payer: Self-pay | Admitting: Internal Medicine

## 2022-11-05 NOTE — Telephone Encounter (Signed)
Called patients daughter again and left voicemail asking both the questions and is waiting on her response

## 2022-11-05 NOTE — Telephone Encounter (Signed)
Contacted patients daughter and left a voicemail letting her know to give [place] a call back soon as possible

## 2022-11-05 NOTE — Telephone Encounter (Signed)
I last saw her Sept 2023. Can you clarify what medication she is referring to and what kidney problem she is having

## 2022-11-05 NOTE — Telephone Encounter (Signed)
Patient's daughter called and stated that she would like a call back from Dr. Okey Dupre or the nurse to discuss her mother's kidney medication and upping the dosage because it doesn't seem to be working. Best callback number is [phone].

## 2022-11-06 NOTE — Telephone Encounter (Signed)
Patient daughter Riesa Pope called back and asked for another call back at [phone].

## 2022-11-06 NOTE — Telephone Encounter (Signed)
Likely needs virtual. Haldol is not for kidneys this is for confusion or agitation.

## 2022-11-07 NOTE — Telephone Encounter (Signed)
Pts daughter states she is wanting the Haldol increased due to pts increase in agitation.

## 2022-11-07 NOTE — Telephone Encounter (Signed)
Sounds like visit needed schedule thanks

## 2022-11-08 NOTE — Telephone Encounter (Signed)
I was able to sched the pt for VV on 11/13/2022 to discuss medications.

## 2022-11-13 ENCOUNTER — Telehealth (INDEPENDENT_AMBULATORY_CARE_PROVIDER_SITE_OTHER): Payer: Medicare HMO | Admitting: Internal Medicine

## 2022-11-13 ENCOUNTER — Encounter: Payer: Self-pay | Admitting: Internal Medicine

## 2022-11-13 DIAGNOSIS — R634 Abnormal weight loss: Secondary | ICD-10-CM

## 2022-11-13 DIAGNOSIS — G301 Alzheimer's disease with late onset: Secondary | ICD-10-CM

## 2022-11-13 DIAGNOSIS — F02818 Dementia in other diseases classified elsewhere, unspecified severity, with other behavioral disturbance: Secondary | ICD-10-CM | POA: Diagnosis not present

## 2022-11-13 DIAGNOSIS — R69 Illness, unspecified: Secondary | ICD-10-CM | POA: Diagnosis not present

## 2022-11-13 MED ORDER — ALPRAZOLAM 0.5 MG PO TABS: 0.5000 mg | ORAL_TABLET | Freq: Two times a day (BID) | ORAL | 5 refills | Status: DC | PRN

## 2022-11-13 MED ORDER — MEGESTROL ACETATE 20 MG PO TABS: 20.0000 mg | ORAL_TABLET | Freq: Every day | ORAL | 3 refills | Status: DC

## 2022-11-13 NOTE — Progress Notes (Signed)
Virtual Visit via Video Note  I connected with Alyssa Lindsey on 11/13/22 at  9:20 AM EST by a video enabled telemedicine application and verified that I am speaking with the correct person using two identifiers.  The patient and the provider were at separate locations throughout the entire encounter. Patient location: home, Provider location: work   I discussed the limitations of evaluation and management by telemedicine and the availability of in person appointments. The patient expressed understanding and agreed to proceed. The patient and the provider were the only parties present for the visit unless noted in HPI below.  History of Present Illness: The patient is a 88 y.o. female with visit for change in behavior. Started in the last weeks to month. Has some more aggressive behavior.   Observations/Objective: Appearance: normal, breathing appears normal, casual grooming, memory poor  Assessment and Plan: See problem oriented charting  Follow Up Instructions: increase alprazolam to 0.5 mg BID prn for agitation and refill megace which is helping some  I discussed the assessment and treatment plan with the patient. The patient was provided an opportunity to ask questions and all were answered. The patient agreed with the plan and demonstrated an understanding of the instructions.   The patient was advised to call back or seek an in-person evaluation if the symptoms worsen or if the condition fails to improve as anticipated.  Myrlene Broker, MD

## 2022-11-13 NOTE — Assessment & Plan Note (Signed)
Behavioral disturbance is worsening. No signs of acute infection. Urine culture without pathogen on recent culture from ER. She will increase alprazolam to 0.5 mg BID to see if this helps. They are unsure if they are using haldol as well.

## 2022-11-13 NOTE — Assessment & Plan Note (Signed)
Weight is stable since last visit and megace 20 mg daily has been helpful. Will refill today.

## 2022-11-14 ENCOUNTER — Other Ambulatory Visit: Payer: Self-pay | Admitting: Cardiology

## 2022-11-17 ENCOUNTER — Other Ambulatory Visit: Payer: Self-pay | Admitting: Cardiology

## 2022-11-17 DIAGNOSIS — I480 Paroxysmal atrial fibrillation: Secondary | ICD-10-CM

## 2022-12-05 DIAGNOSIS — D6869 Other thrombophilia: Secondary | ICD-10-CM | POA: Insufficient documentation

## 2022-12-05 NOTE — Progress Notes (Unsigned)
Cardiology Office Note Date:  12/06/2022  Patient ID:  Alyssa, Lindsey 1929/11/25, MRN [id number] PCP:  Hoyt Koch, MD  Cardiologist:  Minus Breeding, MD Electrophysiologist: Cristopher Peru, MD   Chief Complaint: 5yr PPM follow-up; past due  History of Present Illness: Alyssa Lindsey is a 89 y.o. female with PMH notable for permanent AF, CHB s/p PM, HTN; seen today for Cristopher Peru, MD for routine electrophysiology followup. Since last being seen in our clinic the patient reports doing "okay".    Last saw Dr. Lovena Le 03/2021, doing stable, no changes.  Last saw Dr. Percival Spanish 01/2022, having SOB and referred back to pulm. Thought to be multifactorial d/t age, deconditioning, diastolic dysfunction.  Today, her main complaint is shoulder pain after shingles.  She does not eat much, has a ensure for breakfast and then will eat evening meal most days. Walks some at home, in wheelchair today for visit. Sleeps a lot.  Taking xarelto, denies bleeding concerns.  she denies chest pain, palpitations, dyspnea, PND, orthopnea, nausea, vomiting, dizziness, syncope, edema, weight gain, or early satiety.    She is accompanied by her daughter, [name], for today's visit who contributes to history.   Device Information: MDT single chamber PPM, impl 11/2020; dx symptomatic brady d/t CHB  Past Medical History:  Diagnosis Date   Arthritis    Atrial fibrillation (HCC)    Chronic neck pain    Diverticulosis of colon (without mention of hemorrhage)    Family history of malignant neoplasm of gastrointestinal tract    Gastritis    GERD (gastroesophageal reflux disease)    Hiatal hernia    HTN (hypertension)    LBP (low back pain)    Memory problem    Spondylosis    Thrombocytopenia (HCC)    Type II or unspecified type diabetes mellitus without mention of complication, not stated as uncontrolled     Past Surgical History:  Procedure Laterality Date   COLONOSCOPY  2010   NORMAL--  DUE 2020   PACEMAKER IMPLANT N/A 12/16/2020   Procedure: PACEMAKER IMPLANT;  Surgeon: Evans Lance, MD;  Location: [facility];  Service: Cardiovascular;  Laterality: N/A;   TUBAL LIGATION      Current Outpatient Medications  Medication Instructions   acetaminophen (TYLENOL) 1,300 mg, Oral, Every 8 hours PRN   albuterol (PROVENTIL) 2.5 mg, Nebulization, Every 6 hours PRN   albuterol (VENTOLIN HFA) 108 (90 Base) MCG/ACT inhaler 2 puffs, Inhalation, Every 8 hours   ALPRAZolam (XANAX) 0.5 mg, Oral, 2 times daily PRN, for anxiety   amLODipine (NORVASC) 5 MG tablet Take 1 tablet by mouth once daily   Capsaicin 0.075 % STCK .075% topically twice per day   cetirizine (ZYRTEC) 10 MG tablet TAKE ONE TABLET BY MOUTH ONCE DAILY   clotrimazole (LOTRIMIN) 1 % cream Apply to affected area 2 times daily for up to one week.   diclofenac Sodium (VOLTAREN) 2 g, Topical, 4 times daily   donepezil (ARICEPT) 10 mg, Oral, Daily at bedtime   famotidine (PEPCID) 20 mg, Oral, 2 times daily   furosemide (LASIX) 20 MG tablet TAKE 1 TABLET BY MOUTH AS NEEDED FOR EDEMA   haloperidol (HALDOL) 0.5 MG tablet TAKE 1 TABLET BY MOUTH EVERY 8 HOURS AS NEEDED FOR  AGITATION   hydrochlorothiazide (MICROZIDE) 12.5 mg, Oral, Daily   lidocaine (LIDODERM) 5 % 2 patches, Transdermal, Every 24 hours, Remove & Discard patch within 12 hours or as directed by MD   losartan (  COZAAR) 50 MG tablet Take 1 tablet by mouth once daily   megestrol (MEGACE) 20 mg, Oral, Daily   mirtazapine (REMERON) 30 mg, Oral, Daily at bedtime   potassium chloride SA (KLOR-CON M) 20 MEQ tablet 20 mEq, Oral, 2 times daily   VITAMIN D, CHOLECALCIFEROL, PO 1 tablet, Oral, Daily, gummy   Xarelto 15 mg, Oral, Daily with supper    Social History:  The patient  reports that she has never smoked. She has never used smokeless tobacco. She reports that she does not drink alcohol and does not use drugs.   ROS:  Please see the history of present illness.  All other systems are reviewed and otherwise negative.   PHYSICAL EXAM:  VS:  BP 105/60   Pulse 65   Ht 5\' 5"  (1.651 m)   Wt 155 lb (70.3 kg)   SpO2 99%   BMI 25.79 kg/m  BMI: Body mass index is 25.79 kg/m.  GEN- The patient is well appearing, alert and oriented x 3 today.   HEENT: normocephalic, atraumatic; sclera clear, conjunctiva pink; hearing intact; oropharynx clear; neck supple, no JVP Lungs- Clear to ausculation bilaterally, normal work of breathing.  No wheezes, rales, rhonchi Heart- Regular rate and rhythm, no murmurs, rubs or gallops, PMI not laterally displaced GI- soft, non-tender, non-distended, bowel sounds present, no hepatosplenomegaly Extremities- 1+ peripheral edema. no clubbing or cyanosis; DP/PT/radial pulses 2+ bilaterally MS- no significant deformity or atrophy Skin- warm and dry, no rash or lesion,  device pocket well-healed Psych- euthymic mood, full affect Neuro- strength and sensation are intact   Device interrogation done today and reviewed by myself:  Battery good Lead thresholds, impedence, sensing stable good No new HVR episodes No changes made today  EKG is ordered. Personal review of EKG from today shows: AF, v-paced, rate 75bpm  Recent Labs: 02/23/2022: Pro B Natriuretic peptide (BNP) 292.0 03/23/2022: ALT 8; BUN 12; Creatinine, Ser 1.46; Hemoglobin 11.4; Platelets 143.0; Potassium 3.1; Sodium 140; TSH 4.17  No results found for requested labs within last 365 days.   CrCl cannot be calculated (Patient's most recent lab result is older than the maximum 21 days allowed.).   Wt Readings from Last 3 Encounters:  12/06/22 155 lb (70.3 kg)  08/20/22 154 lb (69.9 kg)  07/27/22 154 lb (69.9 kg)     Additional studies reviewed include: Previous EP, cardiology, pulm notes.   TTE 04/18/2020  1. Left ventricular ejection fraction, by estimation, is 60 to 65%. The left ventricle has normal function. The left ventricle has no regional wall motion  abnormalities. There is severe left ventricular hypertrophy of the basal-septal segment. Left ventricular diastolic parameters are indeterminate.   2. Right ventricular systolic function is normal. The right ventricular size is normal. There is mildly elevated pulmonary artery systolic pressure.   3. The mitral valve is normal in structure. Mild mitral valve regurgitation. No evidence of mitral stenosis.   4. The aortic valve is normal in structure. Aortic valve regurgitation is not visualized. No aortic stenosis is present.   5. The inferior vena cava is normal in size with greater than 50% respiratory variability, suggesting right atrial pressure of 3 mmHg.    ASSESSMENT AND PLAN:  #) CHB s/p MDT single-chamber PM Device functioning well, see paceart for details No symptomatic episodes  #) permanent Afib #) hypercoag d/t Afib V-rates well controlled Asymptomatic, no palpitations  CHA2DS2-VASc Score = 5 [CHF History: 0, HTN History: 1, Diabetes History: 0, Stroke History: 0, Vascular  Disease History: 1, Age Score: 2, Gender Score: 1].  Therefore, the patient's annual risk of stroke is 7.2 %. [place] - Xarelto 15mg  daily, appropriately dosed based on CrCl 27.7 - updated labs today     #) HTN At goal today.  Recommend checking blood pressures 1-2 times per week at home and recording the values.  Recommend bringing these recordings to the primary care physician.   Current medicines are reviewed at length with the patient today.   The patient does not have concerns regarding her medicines.  The following changes were made today:  none  Labs/ tests ordered today include:  Orders Placed This Encounter  Procedures   Basic Metabolic Panel (BMET)   CBC   EKG 12-Lead    Disposition: Follow up with Dr. Lovena Le in in 12 months   Signed, Mamie Levers, NP  12/06/22 10:35 AM   [place] [address] [place] [address] [phone] (office)  [phone]  (fax)

## 2022-12-06 ENCOUNTER — Encounter: Payer: Self-pay | Admitting: Student

## 2022-12-06 ENCOUNTER — Ambulatory Visit: Payer: Medicare HMO | Attending: Physician Assistant | Admitting: Cardiology

## 2022-12-06 VITALS — BP 105/60 | HR 65 | Ht 65.0 in | Wt 155.0 lb

## 2022-12-06 DIAGNOSIS — D6869 Other thrombophilia: Secondary | ICD-10-CM

## 2022-12-06 DIAGNOSIS — I10 Essential (primary) hypertension: Secondary | ICD-10-CM | POA: Diagnosis not present

## 2022-12-06 DIAGNOSIS — I4821 Permanent atrial fibrillation: Secondary | ICD-10-CM | POA: Diagnosis not present

## 2022-12-06 DIAGNOSIS — Z950 Presence of cardiac pacemaker: Secondary | ICD-10-CM

## 2022-12-06 LAB — CUP PACEART INCLINIC DEVICE CHECK
Battery Remaining Longevity: 137 mo
Battery Voltage: 3.03 V
Brady Statistic RV Percent Paced: 78.06 %
Date Time Interrogation Session: 20240111134348
Implantable Lead Connection Status: 753985
Implantable Lead Implant Date: 20220121
Implantable Lead Location: 753860
Implantable Lead Model: 3830
Implantable Pulse Generator Implant Date: 20220121
Lead Channel Impedance Value: 380 Ohm
Lead Channel Impedance Value: 551 Ohm
Lead Channel Pacing Threshold Amplitude: 1 V
Lead Channel Pacing Threshold Pulse Width: 0.4 ms
Lead Channel Sensing Intrinsic Amplitude: 27.625 mV
Lead Channel Sensing Intrinsic Amplitude: 29.375 mV
Lead Channel Setting Pacing Amplitude: 2.5 V
Lead Channel Setting Pacing Pulse Width: 0.4 ms
Lead Channel Setting Sensing Sensitivity: 0.9 mV
Zone Setting Status: 755011

## 2022-12-06 LAB — CBC

## 2022-12-06 NOTE — Patient Instructions (Signed)
Medication Instructions:  Your physician recommends that you continue on your current medications as directed. Please refer to the Current Medication list given to you today.  *If you need a refill on your cardiac medications before your next appointment, please call your pharmacy*   Lab Work: TODAY: BMET, CBC  If you have labs (blood work) drawn today and your tests are completely normal, you will receive your results only by: MyChart Message (if you have MyChart) OR A paper copy in the mail If you have any lab test that is abnormal or we need to change your treatment, we will call you to review the results.   Follow-Up: At [facility], you and your health needs are our priority.  As part of our continuing mission to provide you with exceptional heart care, we have created designated Provider Care Teams.  These Care Teams include your primary Cardiologist (physician) and Advanced Practice Providers (APPs -  Physician Assistants and Nurse Practitioners) who all work together to provide you with the care you need, when you need it.  Your next appointment:   1 year(s)  Provider:   Gregg Taylor, MD    

## 2022-12-07 LAB — CBC
Hematocrit: 37.6 % (ref 34.0–46.6)
Hemoglobin: 12.2 g/dL (ref 11.1–15.9)
MCH: 27.3 pg (ref 26.6–33.0)
MCHC: 32.4 g/dL (ref 31.5–35.7)
MCV: 84 fL (ref 79–97)
Platelets: 137 10*3/uL — ABNORMAL LOW (ref 150–450)
RBC: 4.47 x10E6/uL (ref 3.77–5.28)
RDW: 14.2 % (ref 11.7–15.4)
WBC: 7 10*3/uL (ref 3.4–10.8)

## 2022-12-07 LAB — BASIC METABOLIC PANEL
BUN/Creatinine Ratio: 21 (ref 12–28)
BUN: 32 mg/dL (ref 10–36)
CO2: 21 mmol/L (ref 20–29)
Calcium: 9.4 mg/dL (ref 8.7–10.3)
Chloride: 108 mmol/L — ABNORMAL HIGH (ref 96–106)
Creatinine, Ser: 1.53 mg/dL — ABNORMAL HIGH (ref 0.57–1.00)
Glucose: 113 mg/dL — ABNORMAL HIGH (ref 70–99)
Potassium: 4.4 mmol/L (ref 3.5–5.2)
Sodium: 144 mmol/L (ref 134–144)
eGFR: 32 mL/min/{1.73_m2} — ABNORMAL LOW (ref >59)

## 2022-12-17 ENCOUNTER — Other Ambulatory Visit: Payer: Self-pay | Admitting: Internal Medicine

## 2022-12-19 ENCOUNTER — Ambulatory Visit: Payer: Medicare HMO | Attending: Internal Medicine

## 2022-12-19 DIAGNOSIS — I442 Atrioventricular block, complete: Secondary | ICD-10-CM

## 2022-12-19 LAB — CUP PACEART REMOTE DEVICE CHECK
Battery Remaining Longevity: 136 mo
Battery Voltage: 3.03 V
Brady Statistic RV Percent Paced: 99.15 %
Date Time Interrogation Session: 20240124050133
Implantable Lead Connection Status: 753985
Implantable Lead Implant Date: 20220121
Implantable Lead Location: 753860
Implantable Lead Model: 3830
Implantable Pulse Generator Implant Date: 20220121
Lead Channel Impedance Value: 361 Ohm
Lead Channel Impedance Value: 532 Ohm
Lead Channel Pacing Threshold Amplitude: 0.75 V
Lead Channel Pacing Threshold Pulse Width: 0.4 ms
Lead Channel Sensing Intrinsic Amplitude: 25.375 mV
Lead Channel Sensing Intrinsic Amplitude: 25.375 mV
Lead Channel Setting Pacing Amplitude: 2.5 V
Lead Channel Setting Pacing Pulse Width: 0.4 ms
Lead Channel Setting Sensing Sensitivity: 0.9 mV
Zone Setting Status: 755011

## 2022-12-27 ENCOUNTER — Other Ambulatory Visit: Payer: Self-pay | Admitting: Internal Medicine

## 2023-01-10 NOTE — Progress Notes (Signed)
Remote pacemaker transmission.   

## 2023-01-15 ENCOUNTER — Other Ambulatory Visit: Payer: Self-pay | Admitting: Internal Medicine

## 2023-01-16 ENCOUNTER — Ambulatory Visit (INDEPENDENT_AMBULATORY_CARE_PROVIDER_SITE_OTHER): Payer: Medicare HMO | Admitting: Internal Medicine

## 2023-01-16 ENCOUNTER — Encounter: Payer: Self-pay | Admitting: Internal Medicine

## 2023-01-16 VITALS — BP 120/80 | HR 76 | Temp 98.3°F | Ht 65.0 in | Wt 151.0 lb

## 2023-01-16 DIAGNOSIS — R634 Abnormal weight loss: Secondary | ICD-10-CM | POA: Diagnosis not present

## 2023-01-16 DIAGNOSIS — R052 Subacute cough: Secondary | ICD-10-CM | POA: Diagnosis not present

## 2023-01-16 DIAGNOSIS — R69 Illness, unspecified: Secondary | ICD-10-CM | POA: Diagnosis not present

## 2023-01-16 DIAGNOSIS — E1142 Type 2 diabetes mellitus with diabetic polyneuropathy: Secondary | ICD-10-CM

## 2023-01-16 DIAGNOSIS — M545 Low back pain, unspecified: Secondary | ICD-10-CM

## 2023-01-16 DIAGNOSIS — F02818 Dementia in other diseases classified elsewhere, unspecified severity, with other behavioral disturbance: Secondary | ICD-10-CM

## 2023-01-16 DIAGNOSIS — I482 Chronic atrial fibrillation, unspecified: Secondary | ICD-10-CM

## 2023-01-16 DIAGNOSIS — G301 Alzheimer's disease with late onset: Secondary | ICD-10-CM

## 2023-01-16 MED ORDER — PREDNISONE 20 MG PO TABS: 40.0000 mg | ORAL_TABLET | Freq: Every day | ORAL | 0 refills | Status: DC

## 2023-01-16 MED ORDER — DOXYCYCLINE HYCLATE 100 MG PO TABS: 100.0000 mg | ORAL_TABLET | Freq: Two times a day (BID) | ORAL | 0 refills | Status: DC

## 2023-01-16 NOTE — Patient Instructions (Signed)
We will get hospice out to help in the house.  We have sent in doxycycline to take 1 pill twice a day for 1 week.  We are sending in prednisone to take 2 pills daily for 5 days.

## 2023-01-16 NOTE — Progress Notes (Signed)
   Subjective:   Patient ID: Alyssa Lindsey, female    DOB: Nov 20, 1929, 89 y.o.   MRN: [id number]  HPI The patient is a 89 YO female coming in for cough and worsening overall status. Alzheimer's is advancing and still having behavioral changes. New cough in last 2 weeks. Deny fevers or chills.   The patient requires a hospital bed because he/she requires positioning of the body in ways not feasible with an ordinary bed or to relieve pain.  The patient requires a regular hospital bed.  A gel overlay is not required.     Review of Systems  Unable to perform ROS: Dementia  Constitutional:  Positive for activity change, appetite change and fatigue.  Respiratory:  Positive for cough.   Psychiatric/Behavioral:  Positive for behavioral problems.     Objective:  Physical Exam Constitutional:      Appearance: She is well-developed.     Comments: Chronically ill appearing  HENT:     Head: Normocephalic and atraumatic.  Cardiovascular:     Rate and Rhythm: Normal rate. Rhythm irregular.  Pulmonary:     Effort: Pulmonary effort is normal. No respiratory distress.     Breath sounds: Rhonchi present. No wheezing or rales.  Abdominal:     General: Bowel sounds are normal. There is no distension.     Palpations: Abdomen is soft.     Tenderness: There is no abdominal tenderness. There is no rebound.  Musculoskeletal:     Cervical back: Normal range of motion.  Skin:    General: Skin is warm and dry.  Neurological:     Mental Status: She is alert and oriented to person, place, and time.     Coordination: Coordination normal.     Vitals:   01/16/23 1130  BP: 120/80  Pulse: 76  Temp: 98.3 F (36.8 C)  TempSrc: Oral  SpO2: 99%  Weight: 151 lb (68.5 kg)  Height: 5' 5"$  (1.651 m)    Assessment & Plan:

## 2023-01-17 ENCOUNTER — Encounter: Payer: Self-pay | Admitting: Internal Medicine

## 2023-01-17 ENCOUNTER — Telehealth: Payer: Self-pay | Admitting: Internal Medicine

## 2023-01-17 NOTE — Telephone Encounter (Signed)
Dr. Jobie Quaker with hospice for [facility] called and wanted to speak about a referral that was made. He can be reached at [phone].

## 2023-01-18 ENCOUNTER — Encounter: Payer: Self-pay | Admitting: Internal Medicine

## 2023-01-18 NOTE — Assessment & Plan Note (Signed)
Overall progressive and still having behavioral issues. Taking alprazolam and haldol for management. Family is getting unable to care independently for her. Referral to hospice to assess with new cough she could be showing signs of aspiration which would be a progression.Hospital bed is needed to help prevent bed sores.

## 2023-01-18 NOTE — Assessment & Plan Note (Signed)
Foot exam done and diet controlled we will monitor HgA1c as appropriate but it seems unlikely she will need treatment with poor appetite and weight loss.

## 2023-01-18 NOTE — Assessment & Plan Note (Signed)
Chronic and needs to reposition frequently due to pain. Ordered hospital bed to help accomplish this. Due to weakness she is unable to reposition without assistance.

## 2023-01-18 NOTE — Assessment & Plan Note (Signed)
New/worsening in 1-2 weeks. Rhonchi on exam. Goals of care wish to limit invasive or imaging so we will treat empirically with doxycycline 1 week and prednisone 5 day course. Referral to hospice as this may represent aspiration pneumonia which would be an indication of worsening status.

## 2023-01-18 NOTE — Assessment & Plan Note (Addendum)
Appetite is decreasing and weight is still decreasing gradually. We did discuss natural progression of disease state. Still taking megace 20 mg daily which has helped some with appetite. Will continue.

## 2023-01-21 ENCOUNTER — Telehealth: Payer: Self-pay

## 2023-01-21 MED ORDER — LIDOCAINE 5 % EX PTCH: 2.0000 | MEDICATED_PATCH | CUTANEOUS | 11 refills | Status: AC

## 2023-01-21 NOTE — Telephone Encounter (Signed)
(  10:38 am) PC SW scheduled an initial visit for patient with her daughter-[name]. Clinical team is scheduled to see patient on 01/23/23 @ 10:30 am.

## 2023-01-23 ENCOUNTER — Other Ambulatory Visit: Payer: Medicare HMO

## 2023-01-23 VITALS — BP 120/60 | HR 60 | Temp 97.6°F

## 2023-01-23 DIAGNOSIS — Z515 Encounter for palliative care: Secondary | ICD-10-CM

## 2023-01-23 NOTE — Progress Notes (Signed)
COMMUNITY PALLIATIVE CARE SW NOTE  PATIENT NAME: Alyssa Lindsey DOB: 08-Apr-1929 MRN: [id number]  PRIMARY CARE PROVIDER: Hoyt Koch, MD  RESPONSIBLE PARTY:  Acct ID - Guarantor Home Phone Work Phone Relationship Acct Type  [id number] - Alyssa Lindsey,Alyssa [phone]  Self P/F     [address], [place], [place] [address]   Palliative Care Visit/Clinical Social Work  Joint SW and nurse-D. [name] completed a visit with patient at her home. Patient was a previously established patient.  Patient's daughters-[name] and [name] was present with her.  Patient is not eating or drinking as much, despite use of an appetite stimulate. She may have a protein drink and will drink the entire. Patient having increased weakness. She is not ambulating as much. Patient is having increased instances of incontinence of bladder. Patient is sleeping more during the day. She does not have any interest in doing anything. Patient is having increased confusion. She calls her children other family member or deceased family members.  Patient has SOB with ambulating. Patient has a congested cough, but nurse indicates patient's lungs are clear. When it is productive the daughter report that it is a light brown. Patient has involuntary shakes in her hands.  She is now ambulating with a walker, but with 1-2 person assistance.  Patient had a fall a week ago and EMS was called to get her up. Patient had no injuries from the fall.  The team reviewed and provided Dementia Resource book.  They were provided education regarding hospice. Patient had hospice consult 01/17/23, but did not admit.   Goals of Care: For patient to be comfortable.  The doctor has ordered a hospital bed. Patient has a walker. Next Scheduled appoint: February 20, 2023 @ 10am   Social History   Tobacco Use   Smoking status: Never   Smokeless tobacco: Never  Substance Use Topics   Alcohol use: No    Alcohol/week: 0.0 standard drinks of alcohol     CODE STATUS: Full Code ADVANCED DIRECTIVES: No MOST FORM COMPLETE:  No HOSPICE EDUCATION PROVIDED: No  Duration of visit and documentation: 60 minutes  Destiny Hagin, LCSW

## 2023-01-23 NOTE — Progress Notes (Signed)
PATIENT NAME: Alyssa Lindsey DOB: August 04, 1929 MRN: [id number]  PRIMARY CARE PROVIDER: Hoyt Koch, MD  RESPONSIBLE PARTY:  Acct ID - Guarantor Home Phone Work Phone Relationship Acct Type  [id number] - Travieso,Alyssa Lindsey  Self P/F     [address] [place], [place] [address]   Palliative Care Follow Up Encounter Note   Completed visit w/.Alyssa Lindsey, Alyssa Lindsey and Alyssa Lindsey (daughters) also present.     HISTORY OF PRESENT ILLNESS:    Respiratory: sometimes get SOB when she walks and will take her inhaler with direction from daughters  Cardiac: A-fib; no other issues at this time  Cognitive: Alzheimer's disease; pt was minimally interactive; only answers questions when asked but cannot always answer correctly or at all   Appetite: reduced meals; 1 meal is a small amount of food (2-4 Tbsp in each section) in a 3 section plate; drinks a protein drink daily; pt states "I don't want nothing but a bite; eats only 1/2 sandwich as a meal  GI/GU: typically no issues but has constipation on occasion   Mobility: continues to go out to doctor's appts; needs help to ambulate; has had 1 fall in the past 3 months on 2.19.24  ADLs: totally dependent; cannot remember what to do when trying to groom   Sleeping Pattern: sleeps all night and a large part of the day  Pain: family believes she has no pain issues but is frequently uncomfortable when sitting in her chair  Wt: amily reports pt has lost 20+lbs in 24 months  Palliative Care/ Hospice: RN explained role and purpose of palliative care including visit frequency. Also discussed benefits of hospice care as well as the differences between the two with patient.   Goals of Care: To stay in the home  CODE STATUS: Full Code ADVANCED DIRECTIVES: N MOST FORM: N PPS: 40%   PHYSICAL EXAM:   VITALS: Today's Vitals   01/23/23 1049  BP: 120/60  Pulse: 60  Temp: 97.6 F (36.4 C)  SpO2: 97%    LUNGS: clear to auscultation   CARDIAC: Cor RRR EXTREMITIES: normal SKIN: Skin color, texture, turgor normal. No rashes or lesions  NEURO: negative except for memory problems and weakness       Alyssa Lindsey Alyssa Housekeeper, LPN

## 2023-01-29 ENCOUNTER — Ambulatory Visit: Payer: Medicare HMO | Admitting: Internal Medicine

## 2023-02-04 ENCOUNTER — Telehealth: Payer: Self-pay | Admitting: Internal Medicine

## 2023-02-04 ENCOUNTER — Telehealth: Payer: Self-pay

## 2023-02-04 NOTE — Telephone Encounter (Signed)
Patient has been authorized to get hospital bed by Dr. Sharlet Salina. The facility that they are getting the bed from says they need notes from the appointment when that decision was made so they can see why the patient needs the hospital bed.  [facility] fax is [phone].

## 2023-02-04 NOTE — Telephone Encounter (Signed)
Returned a phone call to Alanda Slim who left a message asking for a call back. No answer and LPN left a message for [name] to return the call. LPN to follow up on [id number].  Arnetta Odeh [name], [facility]  Palliative Care Nurse [phone]

## 2023-02-07 ENCOUNTER — Telehealth: Payer: Self-pay | Admitting: Internal Medicine

## 2023-02-07 NOTE — Telephone Encounter (Signed)
Patient's daughter called and said the insurance company won't cover lidocaine (LIDODERM) 5 % . They want to know why it was prescribed. She said they would likely need to have the appointment notes from when it was prescribed.  Best callback is [phone] for daughter.

## 2023-02-08 NOTE — Telephone Encounter (Signed)
Pt need PA on Lidocaine patches submitted  w/ (Key: [id number]) waiting on insurance determination.Marland KitchenJohny Chess

## 2023-02-08 NOTE — Telephone Encounter (Signed)
Notes Faxed to [employer] [phone]

## 2023-02-11 NOTE — Telephone Encounter (Signed)
Rec;d determination ned was Denied. Insurance plan does not cover.Marland KitchenJohny Lindsey

## 2023-02-11 NOTE — Telephone Encounter (Signed)
This is for post-herpetic neuralgia was PA submitted with proper clinical dx?

## 2023-02-12 NOTE — Telephone Encounter (Signed)
Notified daughter w/ MD response../lm,b

## 2023-02-12 NOTE — Telephone Encounter (Signed)
Yes, but the med is not on the pt PLAN at all. Does not cover at all.Marland KitchenJohny Lindsey

## 2023-02-12 NOTE — Telephone Encounter (Signed)
Okay they can purchase or use otc 4% lidocaine salon pas or similar.

## 2023-02-14 ENCOUNTER — Other Ambulatory Visit: Payer: Medicare HMO

## 2023-02-14 VITALS — BP 118/68 | HR 62 | Temp 96.9°F

## 2023-02-14 DIAGNOSIS — Z515 Encounter for palliative care: Secondary | ICD-10-CM

## 2023-02-14 NOTE — Progress Notes (Signed)
PATIENT NAME: Alyssa Lindsey DOB: 12/06/1928 MRN: [id number]  PRIMARY CARE PROVIDER: Hoyt Koch, MD  RESPONSIBLE PARTY:  Acct ID - Guarantor Home Phone Work Phone Relationship Acct Type  [id number] - Thome,[phone]  Self P/F     [address] [place], [place] [address]    Palliative Care Follow Up Encounter Note   Completed home visit. Daughters [name] and [name] also present.    Dementia booklet discussed; attempted to normalize pt's behaviors and later stage symptoms   Respiratory: gets SOB when she walks but is now only walking to the bathroom and back to her chair or bed  Cognitive: Alzheimer's disease; pt was very interactive today but didn't always answer appropriately; feisty and wide awake this morning    Appetite: reduced meals with 2-4 Tbsp of food in a 3 section plate; drinks a protein drink daily   GI/GU: incontinent of bowel and bladder  Mobility: daughters report pt is ambulating less around the house and to the bathroom; now mostly sits in the recliner or stays in bed   ADLs: totally dependent   Sleeping Pattern: sleeps all night and a large part of the day   Pain: pt has been complaining of L knee pain (L knee is larger than R knee without edema); no redness or warmth noted; no c/o pain at this time     Goals of Care: To stay in the home   CODE STATUS: Full Code ADVANCED DIRECTIVES: N MOST FORM: N PPS: 30%   PHYSICAL EXAM:   VITALS: Today's Vitals   02/14/23 0859  BP: 118/68  Pulse: 62  Temp: (!) 96.9 F (36.1 C)  TempSrc: Temporal  SpO2: 95%  PainSc: 0-No pain    LUNGS: clear to auscultation  CARDIAC: Cor RRR EXTREMITIES: bilateral hands are now held close to the body; showed family how to do PROM exercises SKIN: Skin color, texture, turgor normal. No rashes or lesions  NEURO: negative except for memory problems and tremors      Alvera Tourigny [name], LPN

## 2023-02-15 ENCOUNTER — Other Ambulatory Visit (HOSPITAL_COMMUNITY): Payer: Self-pay

## 2023-02-20 ENCOUNTER — Other Ambulatory Visit: Payer: Medicare HMO

## 2023-02-21 ENCOUNTER — Other Ambulatory Visit: Payer: Self-pay | Admitting: Cardiology

## 2023-02-21 DIAGNOSIS — I480 Paroxysmal atrial fibrillation: Secondary | ICD-10-CM

## 2023-02-22 DIAGNOSIS — G301 Alzheimer's disease with late onset: Secondary | ICD-10-CM | POA: Diagnosis not present

## 2023-02-22 DIAGNOSIS — E1142 Type 2 diabetes mellitus with diabetic polyneuropathy: Secondary | ICD-10-CM | POA: Diagnosis not present

## 2023-02-22 DIAGNOSIS — R052 Subacute cough: Secondary | ICD-10-CM | POA: Diagnosis not present

## 2023-02-22 NOTE — Telephone Encounter (Signed)
Prescription refill request for Xarelto received.  Indication: a fib Last office visit: 12/06/22 Weight: 151 Age: 89 Scr: 1.53 12/06/22 epic CrCl: 26 mL/min

## 2023-03-04 ENCOUNTER — Ambulatory Visit: Payer: Self-pay | Admitting: *Deleted

## 2023-03-04 NOTE — Patient Outreach (Signed)
  Care Coordination   Outreach from family  Visit Note   03/04/2023 Name: Alyssa Lindsey MRN: [id number] DOB: [birth year]  Alyssa Lindsey is a 89 y.o. year old female who sees Myrlene Broker, MD for primary care. I spoke with  [name] Daughter of [name] by phone today.  What matters to the patients health and wellness today?  Resources to use to assist daughters with repositioning, lifting patient to sit her up safely  Has hospital bed, no hoyer lift [name] states patient is no longer with palliative care and is "dead weight"     Goals Addressed             This Visit's Progress    COMPLETED: [facility] care coordination services   Not on track    Interventions Today    Flowsheet Row Most Recent Value  Chronic Disease   Chronic disease during today's visit Other  [resources to assist daughters with lifting, repositioning patient]  General Interventions   General Interventions Discussed/Reviewed General Interventions Discussed, Level of Care, Communication with  Communication with RN  Heywood Footman to [facility] RN CM F McCray]  Level of Care Personal Care Services  [discussed Does not qualify for medicaid per daughter]  Education Interventions   Education Provided Provided Education  [outreach to pcp for new home health orders for PT & SW _ DME (hoyer lift) Possible use of turn sheets]  Provided Verbal Education On [employer], Other              SDOH assessments and interventions completed:  No     Care Coordination Interventions:  Yes, provided   Follow up plan: No further intervention required.   Encounter Outcome:  Pt. Visit Completed   Edwin Cherian L. [name], RN, BSN, CCM [facility] Care Management Community Coordinator Office number [phone]

## 2023-03-08 ENCOUNTER — Telehealth: Payer: Self-pay | Admitting: Internal Medicine

## 2023-03-08 NOTE — Telephone Encounter (Signed)
Patien'ts daughter called and wants to know if Dr. Okey Dupre can put in an order with [facility] for a gel matteress.  For a twin bed.  Patient's mattress is getting flat and [employer] said they would need for Dr. Okey Dupre to order a gel mattress.

## 2023-03-10 ENCOUNTER — Encounter: Payer: Self-pay | Admitting: Internal Medicine

## 2023-03-13 NOTE — Telephone Encounter (Signed)
Rx done on your desk

## 2023-03-14 ENCOUNTER — Other Ambulatory Visit: Payer: Self-pay

## 2023-03-14 ENCOUNTER — Telehealth: Payer: Self-pay | Admitting: Internal Medicine

## 2023-03-14 VITALS — BP 106/58 | HR 59 | Temp 96.8°F

## 2023-03-14 DIAGNOSIS — R634 Abnormal weight loss: Secondary | ICD-10-CM

## 2023-03-14 DIAGNOSIS — F02818 Dementia in other diseases classified elsewhere, unspecified severity, with other behavioral disturbance: Secondary | ICD-10-CM

## 2023-03-14 DIAGNOSIS — Z515 Encounter for palliative care: Secondary | ICD-10-CM

## 2023-03-14 NOTE — Progress Notes (Signed)
PATIENT NAME: Alyssa Lindsey DOB: October 01, 1929 MRN: [id number]  PRIMARY CARE PROVIDER: Myrlene Broker, MD  RESPONSIBLE PARTY:  Acct ID - Guarantor Home Phone Work Phone Relationship Acct Type  [id number] - Kleiman,DORO* [phone]  Self P/F     [address] [place], [place] [address]    Palliative Care Follow Up Encounter Note    Completed home visit. Daughters [name] and [name] also present.     Dementia booklet discussed; attempted to normalize pt's behaviors and later stage symptoms     Respiratory: gets SOB when she walks but is now only walking to the bathroom and back to her chair or bed   Cognitive: Alzheimer's disease; pt had the covers over her head upon LPNs entry into her room; was somewhat interactive today; daughters report pt has been talking to her mother lately and on occasion has seen someone in the hallway that isn't really there   Appetite: reduced meals with 2-4 Tbsp of food in a 3 section plate; drinks a protein drink daily usually for breakfast; pt doesn't always eat 100%; most times she is eating 2-3 meals    GI/GU: incontinent of bowel and bladder; sometimes daughters walk the pt into the bathroom but she is too weak to hold herself up and too heavy for the family to hold her up; LPN encouraged the family to use the [facility] for all toileting   Mobility: daughters report pt is no longer ambulating less around the house; now mostly sits in the recliner or stays in bed; daughters get her out of bed but they report it is extremely difficult to get her out of bed   ADLs: totally dependent   Sleeping Pattern: sleeps all night and a large part of the day; pt has been calling out and has a difficulty time getting comfortable both in the bed and in the recliner   Pain: denies pain but reports some back discomfort   Fall: last fall on 03/05/23; pt's legs buckled as she was being assisted to the [facility]; pt's daughters lifted her off the floor without injury   Goals  of Care: To stay in the home as long as possible   CODE STATUS: Full Code ADVANCED DIRECTIVES: N MOST FORM: N PPS: 30%   PHYSICAL EXAM:   VITALS: Today's Vitals   03/14/23 0942  BP: (!) 106/58  Pulse: (!) 59  Temp: (!) 96.8 F (36 C)  TempSrc: Temporal  SpO2: 97%  PainSc: 0-No pain    LUNGS: clear to auscultation , decreased breath sounds CARDIAC: Cor RRR EXTREMITIES: weak SKIN: warm, dry, poor turgor NEURO: negative except for coordination problems, memory problems, and weakness    Discussed hospice and called Dr Okey Dupre for a referral and she has agreed to be the attending.   Jaspreet Bodner Clementeen Graham, LPN

## 2023-03-14 NOTE — Telephone Encounter (Signed)
Alyssa Lindsey inform MD had place referral for hospice back in Feb. She states after they access her she was not eligible, but called and spoke w/ Dr. Okey Dupre and agreed hospice was needed, but need referral. [name] states can fax to [phone]. Faxed hospice referral../lmb

## 2023-03-14 NOTE — Telephone Encounter (Signed)
Alyssa Lindsey, a nurse from [place], called to request a hospice eval be done on the pt. States the pt isn't getting up and moving on her own, has a very poor appetite, and is at 30% PTS.  Dr. Okey Dupre gave the verbal ok and agreed to be the attending.  [facility] is requesting we fax a referral for the hospice evaluation.   Please fax to: [phone]  For any questions please call [facility]: [phone]

## 2023-03-20 ENCOUNTER — Ambulatory Visit (INDEPENDENT_AMBULATORY_CARE_PROVIDER_SITE_OTHER): Payer: Medicare HMO

## 2023-03-20 ENCOUNTER — Telehealth: Payer: Self-pay

## 2023-03-20 DIAGNOSIS — I442 Atrioventricular block, complete: Secondary | ICD-10-CM

## 2023-03-20 LAB — CUP PACEART REMOTE DEVICE CHECK
Battery Remaining Longevity: 132 mo
Battery Voltage: 3.03 V
Brady Statistic RV Percent Paced: 98.29 %
Date Time Interrogation Session: 20240423225722
Implantable Lead Connection Status: 753985
Implantable Lead Implant Date: 20220121
Implantable Lead Location: 753860
Implantable Lead Model: 3830
Implantable Pulse Generator Implant Date: 20220121
Lead Channel Impedance Value: 380 Ohm
Lead Channel Impedance Value: 532 Ohm
Lead Channel Pacing Threshold Amplitude: 0.75 V
Lead Channel Pacing Threshold Pulse Width: 0.4 ms
Lead Channel Sensing Intrinsic Amplitude: 20.25 mV
Lead Channel Sensing Intrinsic Amplitude: 20.25 mV
Lead Channel Setting Pacing Amplitude: 2.5 V
Lead Channel Setting Pacing Pulse Width: 0.4 ms
Lead Channel Setting Sensing Sensitivity: 0.9 mV
Zone Setting Status: 755011

## 2023-03-21 ENCOUNTER — Other Ambulatory Visit: Payer: Self-pay | Admitting: Internal Medicine

## 2023-03-21 IMAGING — DX DG CHEST 2V
2 series · 2 of 2 positions shown · non-contrast
Comparison: 03/14/2021

CLINICAL DATA: [AGE] female with a history dyspnea on
exertion

EXAM:
CHEST - 2 VIEW

[chest pa]
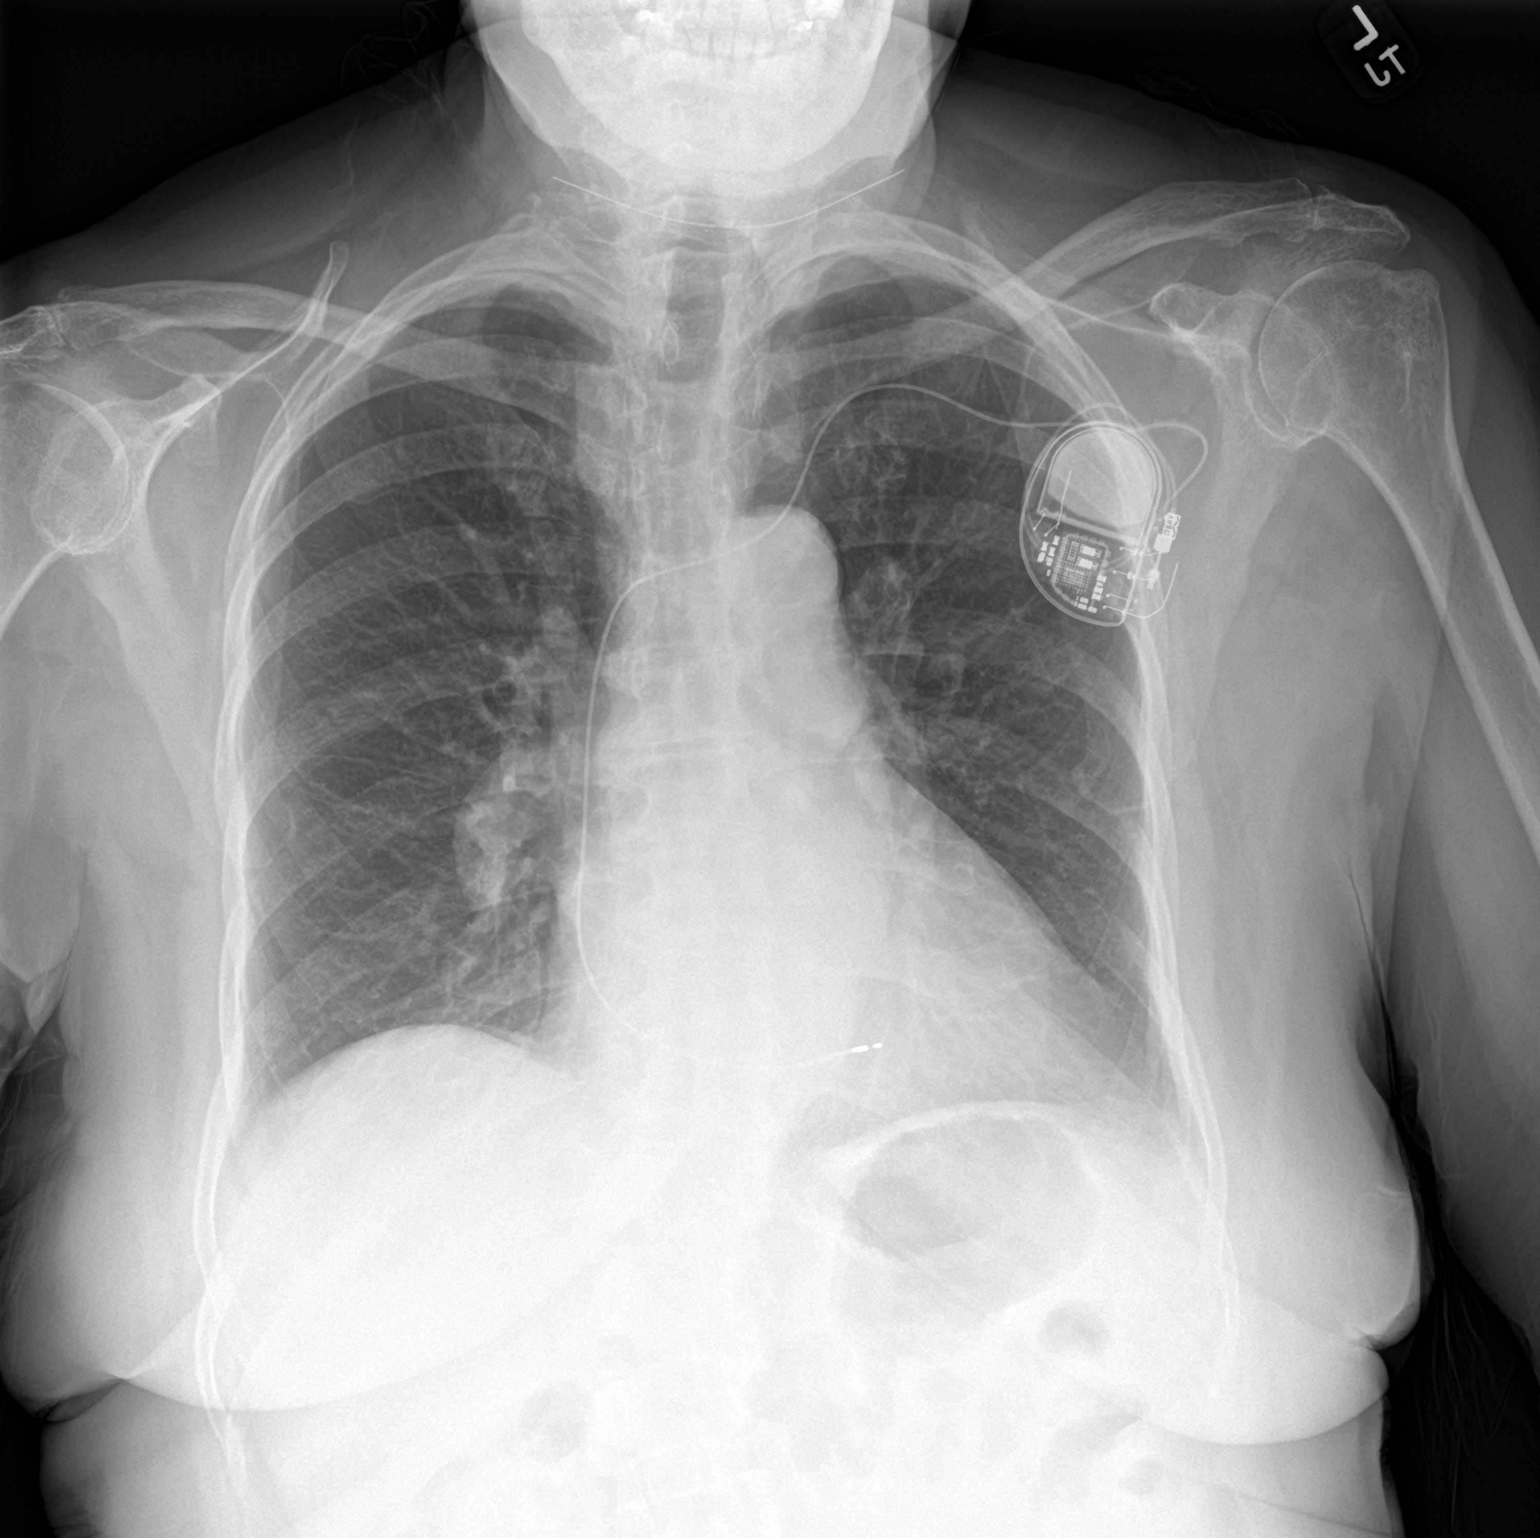

[chest lat]
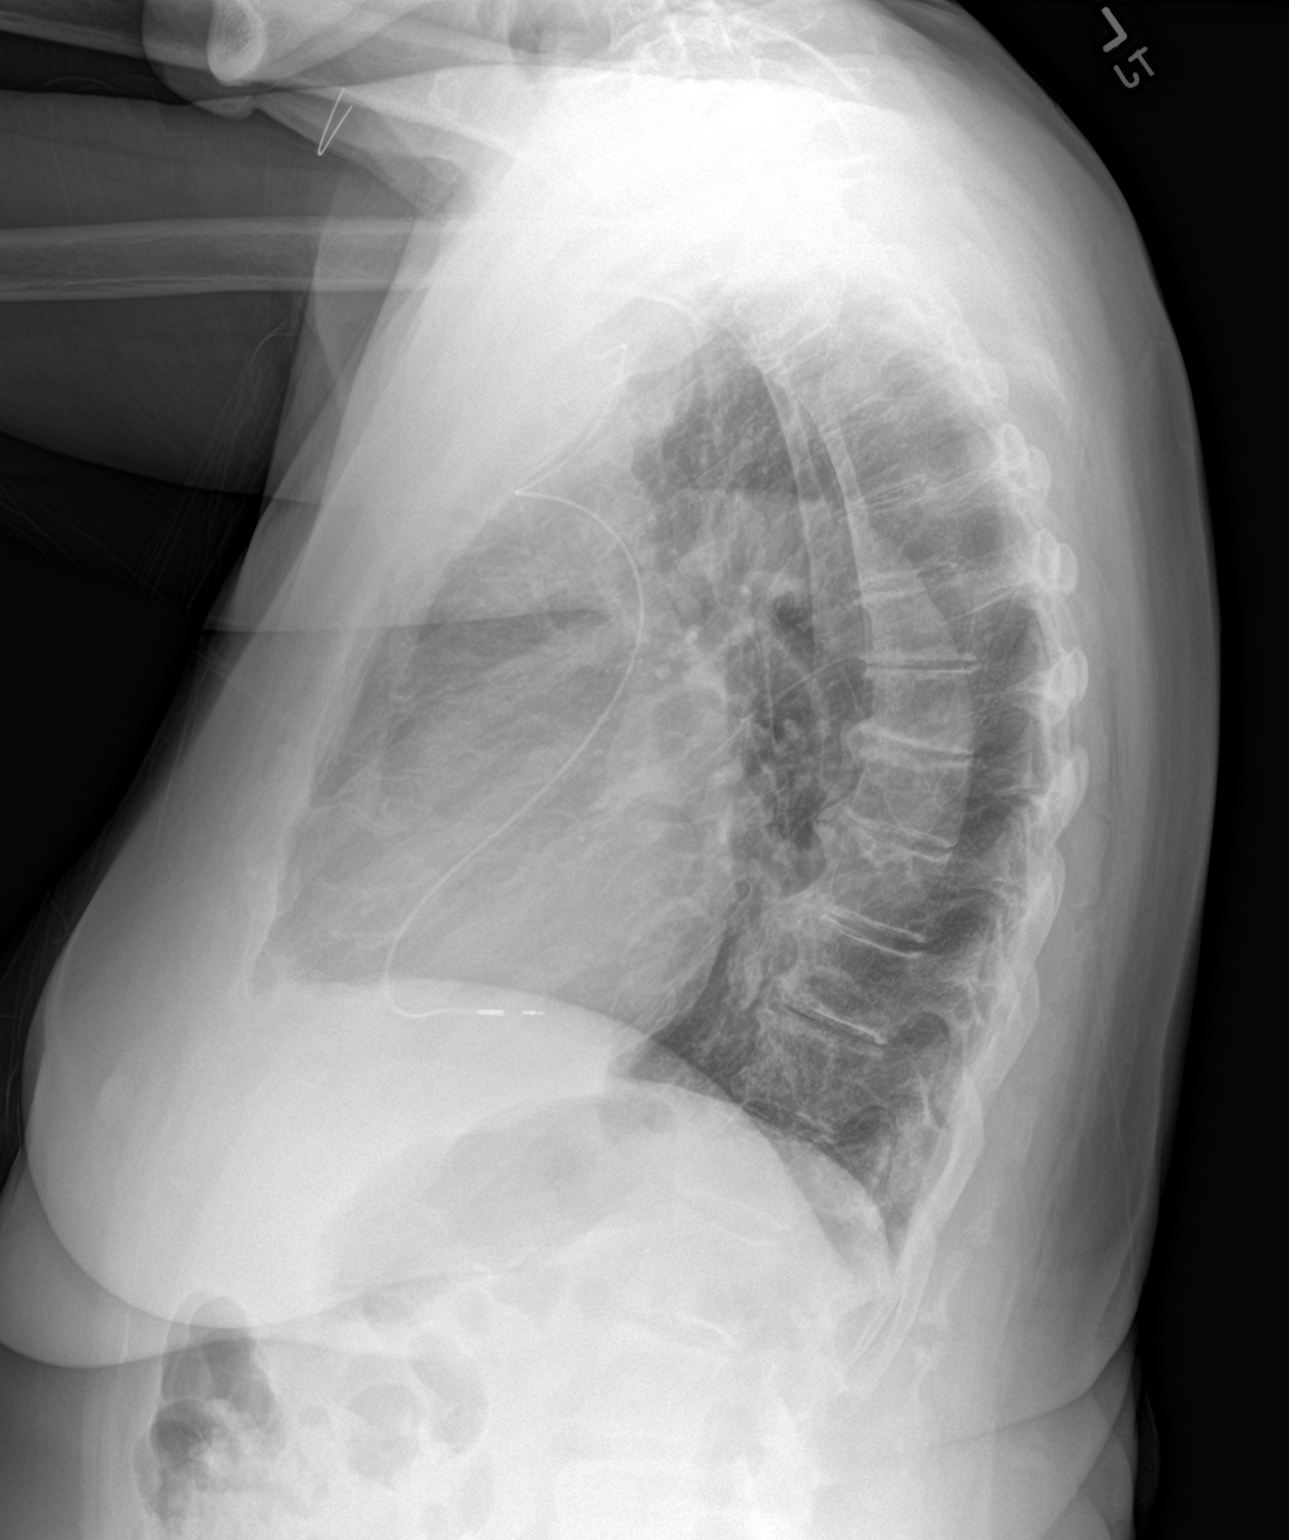

[2 of 2 positions shown; findings below may reference images not displayed]

FINDINGS: Cardiomediastinal silhouette unchanged in size and contour. No
evidence of central vascular congestion. No interlobular septal
thickening.

Pacing device on the left chest wall with single lead, unchanged

No pneumothorax or pleural effusion. Coarsened interstitial
markings, with no confluent airspace disease.

No acute displaced fracture. Degenerative changes of the spine.
IMPRESSION: No active cardiopulmonary disease.

## 2023-03-28 ENCOUNTER — Other Ambulatory Visit: Payer: Self-pay | Admitting: Internal Medicine

## 2023-03-28 NOTE — Telephone Encounter (Signed)
FYI msg to MD closing encounter.Marland KitchenRaechel Lindsey

## 2023-04-04 ENCOUNTER — Telehealth: Payer: Self-pay | Admitting: Internal Medicine

## 2023-04-04 NOTE — Telephone Encounter (Signed)
Contacted Alyssa Lindsey to schedule their annual wellness visit. Appointment made for 04/18/2023.  [facility] Care Guide [facility] AWV TEAM Direct Dial: [phone]

## 2023-04-16 NOTE — Progress Notes (Signed)
Remote pacemaker transmission.   

## 2023-04-17 NOTE — Progress Notes (Signed)
I connected with  Alyssa Lindsey, Alyssa Lindsey  (daughter) on 04/18/2023 by a audio enabled telemedicine application and verified that I am speaking with the correct person using two identifiers. The patient is currently under hospice care. She is mainly in bed, unable to do most activity, self care is mainly with assistance or totally dependant, reduce intake.  Patient Location: Home  Provider Location: Home Office  I discussed the limitations of evaluation and management by telemedicine. The patient expressed understanding and agreed to proceed.   Subjective:   Alyssa Lindsey is a 89 y.o. female who presents for Medicare Annual (Subsequent) preventive examination.  Review of Systems    Per HPI unless specifically indicated below. Cardiac Risk Factors include: advanced age (>54men, >27 women);female gender, Essential hypertension, and chronic atrial fibrillation.           Objective:       03/14/2023    9:42 AM 02/14/2023    8:59 AM 01/23/2023   10:49 AM  Vitals with BMI  Systolic 106 118 161  Diastolic 58 68 60  Pulse 59 62 60    There were no vitals filed for this visit. There is no height or weight on file to calculate BMI.     04/18/2023    2:16 PM 08/20/2022    1:36 PM 08/14/2021    1:29 PM 01/03/2021    9:45 AM 12/16/2020    8:32 AM 12/15/2020    5:18 PM 02/24/2020    5:51 PM  Advanced Directives  Does Patient Have a Medical Advance Directive? Yes Yes Yes Yes Yes Yes No  Type of Estate agent of [place];Living will Healthcare Power of [place];Living will Living will Living will;Healthcare Power of Attorney Living will;Healthcare Power of Attorney    Does patient want to make changes to medical advance directive? No - Guardian declined  No - Patient declined No - Guardian declined No - Patient declined    Copy of Healthcare Power of Attorney in Chart? No - copy requested No - copy requested  No - copy requested No - copy requested    Would  patient like information on creating a medical advance directive?       No - Patient declined    Current Medications (verified) Outpatient Encounter Medications as of 04/18/2023  Medication Sig   acetaminophen (TYLENOL) 650 MG CR tablet Take 1,300 mg by mouth every 8 (eight) hours as needed for pain.   ALPRAZolam (XANAX) 0.5 MG tablet Take 1 tablet (0.5 mg total) by mouth 2 (two) times daily as needed. for anxiety   amLODipine (NORVASC) 5 MG tablet Take 1 tablet by mouth once daily   donepezil (ARICEPT) 10 MG tablet Take 1 tablet (10 mg total) by mouth at bedtime.   furosemide (LASIX) 20 MG tablet TAKE 1 TABLET BY MOUTH AS NEEDED FOR EDEMA   haloperidol (HALDOL) 0.5 MG tablet TAKE 1 TABLET BY MOUTH EVERY 8 HOURS AS NEEDED FOR AGITATION   hydrochlorothiazide (MICROZIDE) 12.5 MG capsule Take 1 capsule (12.5 mg total) by mouth daily.   lidocaine (LIDODERM) 5 % Place 2 patches onto the skin daily. Remove & Discard patch within 12 hours or as directed by MD   losartan (COZAAR) 50 MG tablet Take 1 tablet by mouth once daily   mirtazapine (REMERON) 30 MG tablet TAKE 1 TABLET BY MOUTH AT BEDTIME   VITAMIN D, CHOLECALCIFEROL, PO Take 1 tablet by mouth daily. gummy   XARELTO 15 MG TABS  tablet TAKE 1 TABLET BY MOUTH ONCE DAILY WITH SUPPER   albuterol (PROVENTIL) (2.5 MG/3ML) 0.083% nebulizer solution Take 3 mLs (2.5 mg total) by nebulization every 6 (six) hours as needed for wheezing or shortness of breath. (Patient not taking: Reported on 04/18/2023)   albuterol (VENTOLIN HFA) 108 (90 Base) MCG/ACT inhaler Inhale 2 puffs into the lungs every 8 (eight) hours. (Patient not taking: Reported on 04/18/2023)   cetirizine (ZYRTEC) 10 MG tablet TAKE ONE TABLET BY MOUTH ONCE DAILY (Patient not taking: Reported on 04/18/2023)   famotidine (PEPCID) 20 MG tablet Take 1 tablet (20 mg total) by mouth 2 (two) times daily. (Patient not taking: Reported on 04/18/2023)   [DISCONTINUED] Capsaicin 0.075 % STCK .075% topically  twice per day (Patient not taking: Reported on 04/18/2023)   [DISCONTINUED] clotrimazole (LOTRIMIN) 1 % cream Apply to affected area 2 times daily for up to one week. (Patient not taking: Reported on 04/18/2023)   [DISCONTINUED] diclofenac Sodium (VOLTAREN) 1 % GEL Apply 2 g topically 4 (four) times daily. (Patient not taking: Reported on 04/18/2023)   [DISCONTINUED] doxycycline (VIBRA-TABS) 100 MG tablet Take 1 tablet (100 mg total) by mouth 2 (two) times daily. (Patient not taking: Reported on 04/18/2023)   [DISCONTINUED] megestrol (MEGACE) 20 MG tablet Take 1 tablet (20 mg total) by mouth daily. (Patient not taking: Reported on 04/18/2023)   [DISCONTINUED] potassium chloride SA (KLOR-CON M) 20 MEQ tablet Take 1 tablet (20 mEq total) by mouth 2 (two) times daily. (Patient not taking: Reported on 04/18/2023)   [DISCONTINUED] predniSONE (DELTASONE) 20 MG tablet Take 2 tablets (40 mg total) by mouth daily with breakfast. (Patient not taking: Reported on 04/18/2023)   No facility-administered encounter medications on file as of 04/18/2023.    Allergies (verified) Dicyclomine hcl, Lisinopril, and Aspirin   History: Past Medical History:  Diagnosis Date   Arthritis    Atrial fibrillation (HCC)    Chronic neck pain    Diverticulosis of colon (without mention of hemorrhage)    Family history of malignant neoplasm of gastrointestinal tract    Gastritis    GERD (gastroesophageal reflux disease)    Hiatal hernia    HTN (hypertension)    LBP (low back pain)    Memory problem    Spondylosis    Thrombocytopenia (HCC)    Type II or unspecified type diabetes mellitus without mention of complication, not stated as uncontrolled    Past Surgical History:  Procedure Laterality Date   COLONOSCOPY  2010   NORMAL-- DUE 2020   PACEMAKER IMPLANT N/A 12/16/2020   Procedure: PACEMAKER IMPLANT;  Surgeon: Marinus Maw, MD;  Location: [facility];  Service: Cardiovascular;  Laterality: N/A;   TUBAL  LIGATION     Family History  Problem Relation Age of Onset   Emphysema Brother    Stroke Father 66   Ovarian cancer Sister    Breast cancer Sister    Liver disease Brother    Colon cancer Daughter    Diabetes Daughter    Throat cancer Brother    Lung cancer Daughter    Social History   Socioeconomic History   Marital status: Widowed    Spouse name: deceased   Number of children: 4   Years of education: Not on file   Highest education level: Not on file  Occupational History   Occupation: Retired housekeeper  Tobacco Use   Smoking status: Never   Smokeless tobacco: Never  Vaping Use   Vaping Use: Never  used  Substance and Sexual Activity   Alcohol use: No    Alcohol/week: 0.0 standard drinks of alcohol   Drug use: No   Sexual activity: Not on file  Other Topics Concern   Not on file  Social History Narrative   Widow    4 children: 1 son '59; 3 dtrs '53, '62, '64; 9 grandchildren; great-grand   Lives with daughters, [name] & [name] in the home, grandson at home      Daily Caffeine Use:  1 cup   Regular Exercise -  NO   [name] visually impaired use braille   Son passed in 2016-17   Social Determinants of Health   Financial Resource Strain: Low Risk  (04/18/2023)   Overall Financial Resource Strain (CARDIA)    Difficulty of Paying Living Expenses: Not hard at all  Food Insecurity: No Food Insecurity (04/18/2023)   Hunger Vital Sign    Worried About Running Out of Food in the Last Year: Never true    Ran Out of Food in the Last Year: Never true  Transportation Needs: No Transportation Needs (08/20/2022)   PRAPARE - Administrator, Civil Service (Medical): No    Lack of Transportation (Non-Medical): No  Physical Activity: Insufficiently Active (08/20/2022)   Exercise Vital Sign    Days of Exercise per Week: 3 days    Minutes of Exercise per Session: 30 min  Stress: No Stress Concern Present (08/20/2022)   [employer] of Occupational Health -  Occupational Stress Questionnaire    Feeling of Stress : Not at all  Social Connections: Moderately Isolated (08/20/2022)   Social Connection and Isolation Panel [NHANES]    Frequency of Communication with Friends and Family: More than three times a week    Frequency of Social Gatherings with Friends and Family: More than three times a week    Attends Religious Services: More than 4 times per year    Active Member of [employer] or Organizations: No    Attends Banker Meetings: Never    Marital Status: Widowed    Tobacco Counseling Counseling given: No   Clinical Intake:  Pre-visit preparation completed: No  Pain : No/denies pain   The patient is currently under hospice care. She is mainly in bed, unable to do most activity, self care is mainly with assistance or totally dependant, reduce intake.  Nutritional Status: BMI of 19-24  Normal Diabetes: No  How often do you need to have someone help you when you read instructions, pamphlets, or other written materials from your doctor or pharmacy?: 5 - Always  Diabetic?Nutrition Risk Assessment:  Has the patient had any N/V/D within the last 2 months?  No  Does the patient have any non-healing wounds?  No  Has the patient had any unintentional weight loss or weight gain?  Yes   Diabetes:  Is the patient diabetic?  Yes  If diabetic, was a CBG obtained today?  No  Did the patient bring in their glucometer from home?  No  How often do you monitor your CBG's? Never.   Financial Strains and Diabetes Management:  Are you having any financial strains with the device, your supplies or your medication? No .  Does the patient want to be seen by Chronic Care Management for management of their diabetes?  No  Would the patient like to be referred to a Nutritionist or for Diabetic Management?  No   Diabetic Exams:  Diabetic Eye Exam: Overdue for diabetic eye  exam. Pt has been advised about the importance in completing this exam.  Patient advised to call and schedule an eye exam. Diabetic Foot Exam: Completed 01/16/2023   Interpreter Needed?: No  Information entered by :: Laurel Dimmer, CMA   Activities of Daily Living    04/18/2023    2:03 PM 08/20/2022    1:37 PM  In your present state of health, do you have any difficulty performing the following activities:  Hearing? 1 1  Vision? 0 1  Difficulty concentrating or making decisions? 1 1  Walking or climbing stairs? 1 1  Dressing or bathing? 1 1  Doing errands, shopping? 1 1  Preparing Food and eating ?  Y  Using the Toilet?  Y  In the past six months, have you accidently leaked urine?  Y  Do you have problems with loss of bowel control?  Y  Managing your Medications?  Y  Managing your Finances?  Y  Housekeeping or managing your Housekeeping?  Y    Patient Care Team: Myrlene Broker, MD as PCP - General (Internal Medicine) Rollene Rotunda, MD as PCP - Cardiology (Cardiology) Marinus Maw, MD as PCP - Electrophysiology (Cardiology) Kathyrn Sheriff, [facility] as Pharmacist (Pharmacist) Szabat, Vinnie Level, [facility] (Inactive) as Pharmacist (Pharmacist)  Indicate any recent Medical Services you may have received from other than [facility] providers in the past year (date may be approximate).     Assessment:   This is a routine wellness examination for Alyssa Lindsey.  Hearing/Vision screen Hearing loss Denies any change to her vision.   Dietary issues and exercise activities discussed: Current Exercise Habits: The patient does not participate in regular exercise at present, Exercise limited by: psychological condition(s);neurologic condition(s)   Goals Addressed   None    Depression Screen    01/16/2023   11:34 AM 08/20/2022    1:36 PM 03/23/2022    9:23 AM 08/14/2021    1:17 PM 05/16/2021   11:26 AM 03/15/2021    3:41 PM 01/03/2021    9:46 AM  PHQ 2/9 Scores  PHQ - 2 Score 0 0 0 0 0 2 0  PHQ- 9 Score 0  0   4     Fall Risk    04/18/2023    2:03  PM 01/16/2023   11:34 AM 08/20/2022    1:34 PM 05/25/2022   10:00 AM 04/24/2022   10:30 AM  Fall Risk   Falls in the past year? 1 0 0 1 1  Comment    caregivers deny new/ recent falls since last outreach 04/24/22 caregivers reports one fall "about 2 weeks ago," without injury, confirm patient does not use assistive devices  Number falls in past yr: 1 0 0 1 0  Injury with Fall? 1 0 0 0 0  Risk for fall due to : Mental status change;Impaired balance/gait  No Fall Risks Medication side effect;Impaired mobility;History of fall(s) History of fall(s);Medication side effect;Mental status change  Follow up Falls evaluation completed Falls evaluation completed Falls prevention discussed Falls prevention discussed Falls prevention discussed    FALL RISK PREVENTION PERTAINING TO THE HOME:  Any stairs in or around the home? No  If so, are there any without handrails? No  Home free of loose throw rugs in walkways, pet beds, electrical cords, etc? Yes  Adequate lighting in your home to reduce risk of falls? Yes   ASSISTIVE DEVICES UTILIZED TO PREVENT FALLS:  Life alert? No  Use of a cane, walker or w/c?  Yes  Grab bars in the bathroom? Yes  Shower chair or bench in shower? Yes  Elevated toilet seat or a handicapped toilet? Yes   TIMED UP AND GO:  Was the test performed? Unable to perform, virtual appointment   Cognitive Function:  Late Onset Alzheimer's dementia with behavioral disturbance     06/23/2015    8:32 AM  MMSE - Mini Mental State Exam  Orientation to time 4  Orientation to Place 4  Registration 3  Attention/ Calculation 0  Recall 2  Language- name 2 objects 2  Language- repeat 1  Language- follow 3 step command 3  Language- read & follow direction 0  Write a sentence 1  Copy design 0  Total score 20        Immunizations Immunization History  Administered Date(s) Administered   Fluad Quad(high Dose 65+) 09/11/2019, 10/26/2019, 11/10/2020, 09/27/2021   Influenza Whole  09/07/2005, 09/16/2008   Influenza, High Dose Seasonal PF 11/20/2016, 08/06/2017, 08/12/2018   Influenza,inj,Quad PF,6+ Mos 08/31/2013, 09/02/2014, 09/06/2015   Moderna Sars-Covid-2 Vaccination 02/03/2020, 03/10/2020   Pneumococcal Conjugate-13 03/24/2015   Pneumococcal Polysaccharide-23 10/30/2006, 12/02/2019   Td 05/25/1996, 03/02/2002   Tdap 03/24/2015    TDAP status: Up to date  Flu Vaccine status: Up to date  Pneumococcal vaccine status: Up to date  Covid-19 vaccine status: Information provided on how to obtain vaccines.   Qualifies for Shingles Vaccine? Yes   Zostavax completed No   Shingrix Completed?: No.    Education has been provided regarding the importance of this vaccine. Patient has been advised to call insurance company to determine out of pocket expense if they have not yet received this vaccine. Advised may also receive vaccine at local pharmacy or Health Dept. Verbalized acceptance and understanding.  Screening Tests Health Maintenance  Topic Date Due   OPHTHALMOLOGY EXAM  Never done   COVID-19 Vaccine (3 - Moderna risk series) 04/07/2020   HEMOGLOBIN A1C  03/27/2022   INFLUENZA VACCINE  06/27/2023   FOOT EXAM  01/17/2024   DTaP/Tdap/Td (4 - Td or Tdap) 03/23/2025   Pneumonia Vaccine 70+ Years old  Completed   DEXA SCAN  Completed   HPV VACCINES  Aged Out   Zoster Vaccines- Shingrix  Discontinued    Health Maintenance  Health Maintenance Due  Topic Date Due   OPHTHALMOLOGY EXAM  Never done   COVID-19 Vaccine (3 - Moderna risk series) 04/07/2020   HEMOGLOBIN A1C  03/27/2022    Colorectal cancer screening: No longer required.   Mammogram status: No longer required due to age.  DEXA Scan: 10/10/2006  Lung Cancer Screening: (Low Dose CT Chest recommended if Age 44-80 years, 30 pack-year currently smoking OR have quit w/in 15years.) does not qualify.   Lung Cancer Screening Referral: not applicable   Additional Screening:  Hepatitis C Screening:  does not qualify  Vision Screening: Recommended annual ophthalmology exams for early detection of glaucoma and other disorders of the eye. Is the patient up to date with their annual eye exam?   Who is the provider or what is the name of the office in which the patient attends annual eye exams?  If pt is not established with a provider, would they like to be referred to a provider to establish care? No .   Dental Screening: Recommended annual dental exams for proper oral hygiene  Community Resource Referral / Chronic Care Management: CRR required this visit?  No   CCM required this visit?  No  Plan:     I have personally reviewed and noted the following in the patient's chart:   Medical and social history Use of alcohol, tobacco or illicit drugs  Current medications and supplements including opioid prescriptions. Patient is not currently taking opioid prescriptions. Functional ability and status Nutritional status Physical activity Advanced directives List of other physicians Hospitalizations, surgeries, and ER visits in previous 12 months Vitals Screenings to include cognitive, depression, and falls Referrals and appointments  In addition, I have reviewed and discussed with patient certain preventive protocols, quality metrics, and best practice recommendations. A written personalized care plan for preventive services as well as general preventive health recommendations were provided to patient.     Alyssa Lindsey , Thank you for taking time to come for your Medicare Wellness Visit. I appreciate your ongoing commitment to your health goals. Please review the following plan we discussed and let me know if I can assist you in the future.   These are the goals we discussed:  Goals   None     This is a list of the screening recommended for you and due dates:  Health Maintenance  Topic Date Due   Eye exam for diabetics  Never done   COVID-19 Vaccine (3 - Moderna risk  series) 04/07/2020   Hemoglobin A1C  03/27/2022   Flu Shot  06/27/2023   Complete foot exam   01/17/2024   DTaP/Tdap/Td vaccine (4 - Td or Tdap) 03/23/2025   Pneumonia Vaccine  Completed   DEXA scan (bone density measurement)  Completed   HPV Vaccine  Aged Out   Zoster (Shingles) Vaccine  Discontinued    Lonna Cobb, CMA   04/18/2023   Nurse Notes: Approximately 30 minute Non-Face -To-Face Medicare Wellness Visit

## 2023-04-17 NOTE — Patient Instructions (Signed)

## 2023-04-18 ENCOUNTER — Ambulatory Visit (INDEPENDENT_AMBULATORY_CARE_PROVIDER_SITE_OTHER): Payer: Self-pay

## 2023-04-18 DIAGNOSIS — Z0000 Encounter for general adult medical examination without abnormal findings: Secondary | ICD-10-CM

## 2023-04-20 ENCOUNTER — Other Ambulatory Visit: Payer: Self-pay | Admitting: Cardiology

## 2023-05-05 ENCOUNTER — Other Ambulatory Visit: Payer: Self-pay | Admitting: Cardiology

## 2023-05-17 ENCOUNTER — Other Ambulatory Visit: Payer: Self-pay | Admitting: Cardiology

## 2023-05-17 DIAGNOSIS — I480 Paroxysmal atrial fibrillation: Secondary | ICD-10-CM

## 2023-05-17 NOTE — Telephone Encounter (Signed)
Xarelto 15mg  refill request received. Pt is 89 years old, weight-68.5kg, Crea-1.53 on 12/06/22, last seen by Sherie Don, NP on 12/06/22, Diagnosis-Afib, CrCl-25.37 mL/min; Dose is appropriate based on dosing criteria. Will send in refill to requested pharmacy.

## 2023-05-27 ENCOUNTER — Other Ambulatory Visit: Payer: Self-pay | Admitting: Internal Medicine

## 2023-06-12 ENCOUNTER — Other Ambulatory Visit: Payer: Self-pay | Admitting: Internal Medicine

## 2023-06-19 ENCOUNTER — Ambulatory Visit: Payer: Medicare HMO

## 2023-06-19 DIAGNOSIS — I442 Atrioventricular block, complete: Secondary | ICD-10-CM

## 2023-06-20 ENCOUNTER — Other Ambulatory Visit: Payer: Self-pay | Admitting: Internal Medicine

## 2023-06-20 LAB — CUP PACEART REMOTE DEVICE CHECK
Battery Voltage: 3.02 V
Implantable Lead Connection Status: 753985
Implantable Lead Implant Date: 20220121
Implantable Lead Location: 753860
Implantable Lead Model: 3830
Implantable Pulse Generator Implant Date: 20220121
Lead Channel Impedance Value: 361 Ohm
Lead Channel Impedance Value: 532 Ohm
Lead Channel Pacing Threshold Amplitude: 1 V
Lead Channel Pacing Threshold Pulse Width: 0.4 ms
Lead Channel Sensing Intrinsic Amplitude: 30.25 mV
Lead Channel Sensing Intrinsic Amplitude: 30.25 mV
Lead Channel Setting Pacing Amplitude: 2.5 V
Lead Channel Setting Pacing Pulse Width: 0.4 ms
Lead Channel Setting Sensing Sensitivity: 0.9 mV

## 2023-06-22 ENCOUNTER — Other Ambulatory Visit: Payer: Self-pay | Admitting: Cardiology

## 2023-07-02 NOTE — Progress Notes (Signed)
Remote pacemaker transmission.   

## 2023-08-31 ENCOUNTER — Other Ambulatory Visit: Payer: Self-pay | Admitting: Internal Medicine

## 2023-09-18 ENCOUNTER — Ambulatory Visit: Payer: Medicare HMO

## 2023-09-27 DEATH — deceased

## 2023-10-31 ENCOUNTER — Ambulatory Visit: Payer: Self-pay | Admitting: Internal Medicine

## 2023-12-18 ENCOUNTER — Ambulatory Visit: Payer: Medicare HMO

## 2024-02-07 NOTE — Patient Instructions (Signed)
 Visit Information  Thank you for taking time to visit with me today. Please don't hesitate to contact me if I can be of assistance to you.   Following are the goals we discussed today:   Goals Addressed             This Visit's Progress    COMPLETED: [facility] care coordination services   Not on track    Interventions Today    Flowsheet Row Most Recent Value  Chronic Disease   Chronic disease during today's visit Other  [resources to assist daughters with lifting, repositioning patient]  General Interventions   General Interventions Discussed/Reviewed General Interventions Discussed, Level of Care, Communication with  Communication with RN  Heywood Footman to [facility] RN CM F McCray]  Level of Care Personal Care Services  [discussed Does not qualify for medicaid per daughter]  Education Interventions   Education Provided Provided Education  [outreach to pcp for new home health orders for PT & SW _ DME (hoyer lift) Possible use of turn sheets]  Provided Verbal Education On [facility], Other              Our next appointment is  n/a  on n/a at n/a  Please call the care guide team at [phone] if you need to cancel or reschedule your appointment.   If you are experiencing a Mental Health or Behavioral Health Crisis or need someone to talk to, please call the Suicide and Crisis Lifeline: 988 call the [place] National Suicide Prevention Lifeline: 301-587-0805 or TTY: 251-165-4454 TTY 971-152-0617) to talk to a trained counselor call 1-800-273-TALK (toll free, 24 hour hotline) go to [facility] [address], [place] ([phone]) call 911   Patient verbalizes understanding of instructions and care plan provided today and agrees to view in MyChart. Active MyChart status and patient understanding of how to access instructions and care plan via MyChart confirmed with patient.     The patient has been provided with contact information for the care  management team and has been advised to call with any health related questions or concerns.   Claud Gowan L. [name], RN, BSN, CCM [facility] Care Management Community Coordinator Office number [phone]

## 2024-03-18 ENCOUNTER — Ambulatory Visit: Payer: Medicare HMO
